# Patient Record
Sex: Male | Born: 1955 | Race: White | Hispanic: No | Marital: Married | Smoking: Former smoker
Health system: Southern US, Community
[De-identification: ages and names within clinical notes are randomized; demographics above are authoritative.]

## PROBLEM LIST (undated history)

## (undated) DIAGNOSIS — K219 Gastro-esophageal reflux disease without esophagitis: Secondary | ICD-10-CM

## (undated) DIAGNOSIS — M1990 Unspecified osteoarthritis, unspecified site: Secondary | ICD-10-CM

## (undated) DIAGNOSIS — E785 Hyperlipidemia, unspecified: Secondary | ICD-10-CM

## (undated) DIAGNOSIS — I10 Essential (primary) hypertension: Secondary | ICD-10-CM

## (undated) DIAGNOSIS — T7840XA Allergy, unspecified, initial encounter: Secondary | ICD-10-CM

## (undated) HISTORY — DX: Gastro-esophageal reflux disease without esophagitis: K21.9

## (undated) HISTORY — PX: GANGLION CYST EXCISION: SHX1691

## (undated) HISTORY — DX: Hyperlipidemia, unspecified: E78.5

## (undated) HISTORY — DX: Unspecified osteoarthritis, unspecified site: M19.90

## (undated) HISTORY — PX: OTHER SURGICAL HISTORY: SHX169

## (undated) HISTORY — PX: TONSILLECTOMY: SHX5217

## (undated) HISTORY — DX: Essential (primary) hypertension: I10

## (undated) HISTORY — PX: PILONIDAL CYST / SINUS EXCISION: SUR543

## (undated) HISTORY — PX: COLONOSCOPY: SHX174

## (undated) HISTORY — DX: Allergy, unspecified, initial encounter: T78.40XA

---

## 2001-04-06 ENCOUNTER — Ambulatory Visit (HOSPITAL_BASED_OUTPATIENT_CLINIC_OR_DEPARTMENT_OTHER): Admission: RE | Admit: 2001-04-06 | Discharge: 2001-04-06 | Payer: Self-pay | Admitting: Orthopedic Surgery

## 2001-04-06 ENCOUNTER — Encounter (INDEPENDENT_AMBULATORY_CARE_PROVIDER_SITE_OTHER): Payer: Self-pay | Admitting: Specialist

## 2006-08-13 ENCOUNTER — Ambulatory Visit: Payer: Self-pay | Admitting: Family Medicine

## 2006-08-21 ENCOUNTER — Ambulatory Visit: Payer: Self-pay | Admitting: Family Medicine

## 2006-09-23 ENCOUNTER — Encounter: Admission: RE | Admit: 2006-09-23 | Discharge: 2006-09-23 | Payer: Self-pay | Admitting: Family Medicine

## 2006-10-13 ENCOUNTER — Ambulatory Visit: Payer: Self-pay | Admitting: Family Medicine

## 2006-10-13 LAB — CONVERTED CEMR LAB
ALT: 81 units/L — ABNORMAL HIGH (ref 0–40)
AST: 57 units/L — ABNORMAL HIGH (ref 0–37)
BUN: 11 mg/dL (ref 6–23)
CO2: 29 meq/L (ref 19–32)
Calcium: 9.2 mg/dL (ref 8.4–10.5)
Chloride: 98 meq/L (ref 96–112)
Creatinine, Ser: 0.9 mg/dL (ref 0.4–1.5)
GFR calc non Af Amer: 95 mL/min
Glomerular Filtration Rate, Af Am: 114 mL/min/{1.73_m2}
Glucose, Bld: 189 mg/dL — ABNORMAL HIGH (ref 70–99)
Hgb A1c MFr Bld: 8.9 % — ABNORMAL HIGH (ref 4.6–6.0)
Potassium: 3.9 meq/L (ref 3.5–5.1)
Sodium: 135 meq/L (ref 135–145)

## 2006-10-19 ENCOUNTER — Ambulatory Visit: Payer: Self-pay | Admitting: Gastroenterology

## 2006-11-13 ENCOUNTER — Ambulatory Visit: Payer: Self-pay | Admitting: Gastroenterology

## 2006-11-18 ENCOUNTER — Ambulatory Visit: Payer: Self-pay | Admitting: Family Medicine

## 2006-11-18 LAB — CONVERTED CEMR LAB
ALT: 148 units/L — ABNORMAL HIGH (ref 0–40)
AST: 113 units/L — ABNORMAL HIGH (ref 0–37)
BUN: 10 mg/dL (ref 6–23)
CO2: 27 meq/L (ref 19–32)
Calcium: 9.4 mg/dL (ref 8.4–10.5)
Chloride: 102 meq/L (ref 96–112)
Chol/HDL Ratio, serum: 4.6
Cholesterol: 144 mg/dL (ref 0–200)
Creatinine, Ser: 1 mg/dL (ref 0.4–1.5)
GFR calc non Af Amer: 84 mL/min
Glomerular Filtration Rate, Af Am: 101 mL/min/{1.73_m2}
Glucose, Bld: 141 mg/dL — ABNORMAL HIGH (ref 70–99)
HDL: 31.5 mg/dL — ABNORMAL LOW (ref >39.0)
Hgb A1c MFr Bld: 8.3 % — ABNORMAL HIGH (ref 4.6–6.0)
LDL DIRECT: 56 mg/dL
Potassium: 4 meq/L (ref 3.5–5.1)
Sodium: 140 meq/L (ref 135–145)
Triglyceride fasting, serum: 449 mg/dL (ref 0–149)
VLDL: 90 mg/dL — ABNORMAL HIGH (ref 0–40)

## 2006-11-19 ENCOUNTER — Encounter (INDEPENDENT_AMBULATORY_CARE_PROVIDER_SITE_OTHER): Payer: Self-pay | Admitting: Family Medicine

## 2006-11-19 LAB — CONVERTED CEMR LAB
Creatinine, Urine: 784.3 mg/dL
Microalb Creat Ratio: 100.5 mg/g — ABNORMAL HIGH (ref 0.0–30.0)
Microalb, Ur: 78.8 mg/dL — ABNORMAL HIGH (ref 0.00–1.89)

## 2006-11-26 ENCOUNTER — Ambulatory Visit: Payer: Self-pay | Admitting: Family Medicine

## 2006-11-27 ENCOUNTER — Emergency Department (HOSPITAL_COMMUNITY): Admission: EM | Admit: 2006-11-27 | Discharge: 2006-11-27 | Payer: Self-pay | Admitting: Emergency Medicine

## 2006-12-01 ENCOUNTER — Encounter: Admission: RE | Admit: 2006-12-01 | Discharge: 2007-03-01 | Payer: Self-pay | Admitting: Family Medicine

## 2006-12-02 ENCOUNTER — Ambulatory Visit: Payer: Self-pay | Admitting: Family Medicine

## 2006-12-23 ENCOUNTER — Ambulatory Visit: Payer: Self-pay | Admitting: Family Medicine

## 2006-12-23 LAB — CONVERTED CEMR LAB
BUN: 13 mg/dL (ref 6–23)
CO2: 34 meq/L — ABNORMAL HIGH (ref 19–32)
Calcium: 9.5 mg/dL (ref 8.4–10.5)
Chloride: 101 meq/L (ref 96–112)
Creatinine, Ser: 0.8 mg/dL (ref 0.4–1.5)
Creatinine,U: 131.5 mg/dL
GFR calc Af Amer: 131 mL/min
GFR calc non Af Amer: 108 mL/min
Glucose, Bld: 143 mg/dL — ABNORMAL HIGH (ref 70–99)
Hgb A1c MFr Bld: 7.4 % — ABNORMAL HIGH (ref 4.6–6.0)
Microalb Creat Ratio: 38.8 mg/g — ABNORMAL HIGH (ref 0.0–30.0)
Microalb, Ur: 5.1 mg/dL — ABNORMAL HIGH (ref 0.0–1.9)
Potassium: 4.3 meq/L (ref 3.5–5.1)
Sodium: 143 meq/L (ref 135–145)

## 2007-01-07 ENCOUNTER — Ambulatory Visit: Payer: Self-pay | Admitting: Internal Medicine

## 2007-01-14 ENCOUNTER — Ambulatory Visit: Payer: Self-pay | Admitting: Gastroenterology

## 2007-01-21 ENCOUNTER — Ambulatory Visit (HOSPITAL_BASED_OUTPATIENT_CLINIC_OR_DEPARTMENT_OTHER): Admission: RE | Admit: 2007-01-21 | Discharge: 2007-01-21 | Payer: Self-pay | Admitting: Internal Medicine

## 2007-01-24 ENCOUNTER — Ambulatory Visit: Payer: Self-pay | Admitting: Internal Medicine

## 2007-02-05 ENCOUNTER — Ambulatory Visit: Payer: Self-pay | Admitting: Gastroenterology

## 2007-02-05 ENCOUNTER — Encounter (INDEPENDENT_AMBULATORY_CARE_PROVIDER_SITE_OTHER): Payer: Self-pay | Admitting: *Deleted

## 2007-03-22 ENCOUNTER — Ambulatory Visit: Payer: Self-pay | Admitting: Internal Medicine

## 2007-03-26 DIAGNOSIS — K219 Gastro-esophageal reflux disease without esophagitis: Secondary | ICD-10-CM | POA: Insufficient documentation

## 2007-03-26 DIAGNOSIS — E669 Obesity, unspecified: Secondary | ICD-10-CM | POA: Insufficient documentation

## 2007-03-26 DIAGNOSIS — R739 Hyperglycemia, unspecified: Secondary | ICD-10-CM | POA: Insufficient documentation

## 2007-03-26 DIAGNOSIS — E785 Hyperlipidemia, unspecified: Secondary | ICD-10-CM | POA: Insufficient documentation

## 2007-03-26 DIAGNOSIS — I10 Essential (primary) hypertension: Secondary | ICD-10-CM | POA: Insufficient documentation

## 2007-06-30 ENCOUNTER — Ambulatory Visit: Payer: Self-pay | Admitting: Family Medicine

## 2007-06-30 DIAGNOSIS — E1149 Type 2 diabetes mellitus with other diabetic neurological complication: Secondary | ICD-10-CM | POA: Insufficient documentation

## 2007-07-01 ENCOUNTER — Telehealth (INDEPENDENT_AMBULATORY_CARE_PROVIDER_SITE_OTHER): Payer: Self-pay | Admitting: *Deleted

## 2007-07-01 LAB — CONVERTED CEMR LAB
ALT: 135 units/L — ABNORMAL HIGH (ref 0–53)
AST: 180 units/L — ABNORMAL HIGH (ref 0–37)
BUN: 10 mg/dL (ref 6–23)
CO2: 31 meq/L (ref 19–32)
Calcium: 9.2 mg/dL (ref 8.4–10.5)
Chloride: 96 meq/L (ref 96–112)
Cholesterol: 234 mg/dL (ref 0–200)
Creatinine, Ser: 0.8 mg/dL (ref 0.4–1.5)
Creatinine,U: 242.3 mg/dL
Direct LDL: 131.5 mg/dL
GFR calc Af Amer: 131 mL/min
GFR calc non Af Amer: 108 mL/min
Glucose, Bld: 123 mg/dL — ABNORMAL HIGH (ref 70–99)
HDL: 39.4 mg/dL (ref >39.0)
Hgb A1c MFr Bld: 6.5 % — ABNORMAL HIGH (ref 4.6–6.0)
Microalb Creat Ratio: 89.6 mg/g — ABNORMAL HIGH (ref 0.0–30.0)
Microalb, Ur: 21.7 mg/dL — ABNORMAL HIGH (ref 0.0–1.9)
Potassium: 4 meq/L (ref 3.5–5.1)
Sodium: 138 meq/L (ref 135–145)
Total CHOL/HDL Ratio: 5.9
Triglycerides: 334 mg/dL (ref 0–149)
VLDL: 67 mg/dL — ABNORMAL HIGH (ref 0–40)

## 2007-08-24 ENCOUNTER — Encounter (INDEPENDENT_AMBULATORY_CARE_PROVIDER_SITE_OTHER): Payer: Self-pay | Admitting: Family Medicine

## 2007-12-30 ENCOUNTER — Ambulatory Visit: Payer: Self-pay | Admitting: Family Medicine

## 2007-12-30 DIAGNOSIS — B354 Tinea corporis: Secondary | ICD-10-CM | POA: Insufficient documentation

## 2008-01-04 DIAGNOSIS — R945 Abnormal results of liver function studies: Secondary | ICD-10-CM | POA: Insufficient documentation

## 2008-01-04 LAB — CONVERTED CEMR LAB
BUN: 11 mg/dL (ref 6–23)
CO2: 29 meq/L (ref 19–32)
Calcium: 9.5 mg/dL (ref 8.4–10.5)
Chloride: 95 meq/L — ABNORMAL LOW (ref 96–112)
Cholesterol: 194 mg/dL (ref 0–200)
Creatinine, Ser: 0.9 mg/dL (ref 0.4–1.5)
Creatinine,U: 53.1 mg/dL
Direct LDL: 73.4 mg/dL
GFR calc Af Amer: 114 mL/min
GFR calc non Af Amer: 94 mL/min
Glucose, Bld: 115 mg/dL — ABNORMAL HIGH (ref 70–99)
HDL: 36 mg/dL — ABNORMAL LOW (ref >39.0)
Hgb A1c MFr Bld: 7.2 % — ABNORMAL HIGH (ref 4.6–6.0)
Microalb Creat Ratio: 79.1 mg/g — ABNORMAL HIGH (ref 0.0–30.0)
Microalb, Ur: 4.2 mg/dL — ABNORMAL HIGH (ref 0.0–1.9)
PSA: 0.35 ng/mL (ref 0.10–4.00)
Potassium: 4.1 meq/L (ref 3.5–5.1)
Sodium: 134 meq/L — ABNORMAL LOW (ref 135–145)
Total CHOL/HDL Ratio: 5.4
Triglycerides: 574 mg/dL (ref 0–149)
VLDL: 115 mg/dL — ABNORMAL HIGH (ref 0–40)

## 2008-01-05 ENCOUNTER — Encounter (INDEPENDENT_AMBULATORY_CARE_PROVIDER_SITE_OTHER): Payer: Self-pay | Admitting: *Deleted

## 2008-01-05 ENCOUNTER — Telehealth (INDEPENDENT_AMBULATORY_CARE_PROVIDER_SITE_OTHER): Payer: Self-pay | Admitting: *Deleted

## 2008-01-26 ENCOUNTER — Telehealth (INDEPENDENT_AMBULATORY_CARE_PROVIDER_SITE_OTHER): Payer: Self-pay | Admitting: *Deleted

## 2008-05-30 ENCOUNTER — Ambulatory Visit: Payer: Self-pay | Admitting: Internal Medicine

## 2008-05-30 DIAGNOSIS — M1710 Unilateral primary osteoarthritis, unspecified knee: Secondary | ICD-10-CM | POA: Insufficient documentation

## 2008-05-30 DIAGNOSIS — M179 Osteoarthritis of knee, unspecified: Secondary | ICD-10-CM | POA: Insufficient documentation

## 2008-12-25 ENCOUNTER — Telehealth: Payer: Self-pay | Admitting: Internal Medicine

## 2008-12-28 ENCOUNTER — Ambulatory Visit: Payer: Self-pay | Admitting: Internal Medicine

## 2008-12-28 DIAGNOSIS — S300XXA Contusion of lower back and pelvis, initial encounter: Secondary | ICD-10-CM | POA: Insufficient documentation

## 2009-02-01 ENCOUNTER — Telehealth: Payer: Self-pay | Admitting: Internal Medicine

## 2009-04-02 ENCOUNTER — Ambulatory Visit: Payer: Self-pay | Admitting: Internal Medicine

## 2009-04-02 LAB — CONVERTED CEMR LAB
ALT: 109 units/L — ABNORMAL HIGH (ref 0–53)
AST: 127 units/L — ABNORMAL HIGH (ref 0–37)
Albumin: 3.6 g/dL (ref 3.5–5.2)
Alkaline Phosphatase: 128 units/L — ABNORMAL HIGH (ref 39–117)
BUN: 20 mg/dL (ref 6–23)
Basophils Relative: 0 % (ref 0.0–3.0)
Bilirubin Urine: NEGATIVE
Bilirubin, Direct: 0 mg/dL (ref 0.0–0.3)
CO2: 28 meq/L (ref 19–32)
Calcium: 9.1 mg/dL (ref 8.4–10.5)
Chloride: 99 meq/L (ref 96–112)
Cholesterol: 375 mg/dL — ABNORMAL HIGH (ref 0–200)
Creatinine, Ser: 0.7 mg/dL (ref 0.4–1.5)
Direct LDL: 74.7 mg/dL
Eosinophils Relative: 2 % (ref 0.0–5.0)
GFR calc non Af Amer: 125.01 mL/min (ref >60)
Glucose, Bld: 353 mg/dL — ABNORMAL HIGH (ref 70–99)
HCT: 43.4 % (ref 39.0–52.0)
HDL: 16.4 mg/dL — ABNORMAL LOW (ref >39.00)
Hemoglobin, Urine: NEGATIVE
Hemoglobin: 15.3 g/dL (ref 13.0–17.0)
Leukocytes, UA: NEGATIVE
Lymphocytes Relative: 31 % (ref 12.0–46.0)
MCHC: 35.2 g/dL (ref 30.0–36.0)
MCV: 84.2 fL (ref 78.0–100.0)
Monocytes Relative: 7 % (ref 3.0–12.0)
Neutrophils Relative %: 59 % (ref 43.0–77.0)
Nitrite: NEGATIVE
PSA: 0.22 ng/mL (ref 0.10–4.00)
Platelets: 149 10*3/uL — ABNORMAL LOW (ref 150.0–400.0)
Potassium: 4.2 meq/L (ref 3.5–5.1)
RBC: 5.15 M/uL (ref 4.22–5.81)
RDW: 13.3 % (ref 11.5–14.6)
Sodium: 135 meq/L (ref 135–145)
Specific Gravity, Urine: 1.015 (ref 1.000–1.030)
TSH: 5.97 microintl units/mL — ABNORMAL HIGH (ref 0.35–5.50)
Total Bilirubin: 1.1 mg/dL (ref 0.3–1.2)
Total CHOL/HDL Ratio: 23
Total Protein, Urine: 100 mg/dL
Total Protein: 7.2 g/dL (ref 6.0–8.3)
Triglycerides: 2700 mg/dL — ABNORMAL HIGH (ref 0.0–149.0)
Urine Glucose: >=1000 mg/dL
Urobilinogen, UA: 0.2 (ref 0.0–1.0)
VLDL: 540 mg/dL — ABNORMAL HIGH (ref 0.0–40.0)
WBC: 6.8 10*3/uL (ref 4.5–10.5)
pH: 5.5 (ref 5.0–8.0)

## 2009-04-05 ENCOUNTER — Ambulatory Visit: Payer: Self-pay | Admitting: Internal Medicine

## 2009-04-05 ENCOUNTER — Telehealth: Payer: Self-pay | Admitting: Internal Medicine

## 2009-04-05 DIAGNOSIS — E8881 Metabolic syndrome: Secondary | ICD-10-CM | POA: Insufficient documentation

## 2009-04-05 DIAGNOSIS — E039 Hypothyroidism, unspecified: Secondary | ICD-10-CM | POA: Insufficient documentation

## 2009-04-05 LAB — CONVERTED CEMR LAB
Cholesterol, target level: 200 mg/dL
HDL goal, serum: 40 mg/dL
LDL Goal: 100 mg/dL

## 2009-04-25 LAB — HM COLONOSCOPY: HM Colonoscopy: NORMAL

## 2009-04-26 ENCOUNTER — Encounter: Payer: Self-pay | Admitting: Internal Medicine

## 2009-06-18 ENCOUNTER — Telehealth: Payer: Self-pay | Admitting: Internal Medicine

## 2009-06-18 ENCOUNTER — Ambulatory Visit: Payer: Self-pay | Admitting: Internal Medicine

## 2009-06-18 LAB — CONVERTED CEMR LAB
ALT: 85 units/L — ABNORMAL HIGH (ref 0–53)
AST: 82 units/L — ABNORMAL HIGH (ref 0–37)
Albumin: 3.5 g/dL (ref 3.5–5.2)
Alkaline Phosphatase: 82 units/L (ref 39–117)
Bilirubin, Direct: 0.1 mg/dL (ref 0.0–0.3)
Cholesterol: 185 mg/dL (ref 0–200)
Direct LDL: 96.8 mg/dL
HDL: 40 mg/dL (ref >39.00)
Hgb A1c MFr Bld: 8.9 % — ABNORMAL HIGH (ref 4.6–6.5)
Total Bilirubin: 0.6 mg/dL (ref 0.3–1.2)
Total CHOL/HDL Ratio: 5
Total Protein: 7.5 g/dL (ref 6.0–8.3)
Triglycerides: 414 mg/dL — ABNORMAL HIGH (ref 0.0–149.0)
VLDL: 82.8 mg/dL — ABNORMAL HIGH (ref 0.0–40.0)

## 2009-06-20 ENCOUNTER — Ambulatory Visit: Payer: Self-pay | Admitting: Internal Medicine

## 2009-06-20 LAB — HM DIABETES FOOT EXAM

## 2009-07-17 ENCOUNTER — Telehealth: Payer: Self-pay | Admitting: Internal Medicine

## 2009-08-22 ENCOUNTER — Telehealth (INDEPENDENT_AMBULATORY_CARE_PROVIDER_SITE_OTHER): Payer: Self-pay | Admitting: *Deleted

## 2009-09-26 ENCOUNTER — Telehealth: Payer: Self-pay | Admitting: Internal Medicine

## 2009-10-19 ENCOUNTER — Telehealth: Payer: Self-pay | Admitting: Internal Medicine

## 2009-11-29 ENCOUNTER — Telehealth: Payer: Self-pay | Admitting: Internal Medicine

## 2009-12-03 ENCOUNTER — Encounter: Payer: Self-pay | Admitting: Internal Medicine

## 2009-12-26 ENCOUNTER — Telehealth: Payer: Self-pay | Admitting: Internal Medicine

## 2009-12-27 ENCOUNTER — Ambulatory Visit: Payer: Self-pay | Admitting: Internal Medicine

## 2009-12-27 DIAGNOSIS — L84 Corns and callosities: Secondary | ICD-10-CM | POA: Insufficient documentation

## 2010-02-05 ENCOUNTER — Encounter: Payer: Self-pay | Admitting: Internal Medicine

## 2010-03-27 ENCOUNTER — Encounter: Payer: Self-pay | Admitting: Internal Medicine

## 2010-03-28 ENCOUNTER — Encounter: Admission: RE | Admit: 2010-03-28 | Discharge: 2010-06-26 | Payer: Self-pay | Admitting: Surgery

## 2010-03-29 ENCOUNTER — Ambulatory Visit (HOSPITAL_COMMUNITY): Admission: RE | Admit: 2010-03-29 | Discharge: 2010-03-29 | Payer: Self-pay | Admitting: Surgery

## 2010-04-12 ENCOUNTER — Ambulatory Visit (HOSPITAL_COMMUNITY): Admission: RE | Admit: 2010-04-12 | Discharge: 2010-04-12 | Payer: Self-pay | Admitting: Surgery

## 2010-04-17 ENCOUNTER — Encounter: Payer: Self-pay | Admitting: Internal Medicine

## 2010-06-06 ENCOUNTER — Encounter: Payer: Self-pay | Admitting: Internal Medicine

## 2010-06-10 HISTORY — PX: GASTRIC BANDING PORT REVISION: SHX5246

## 2010-06-18 ENCOUNTER — Ambulatory Visit (HOSPITAL_COMMUNITY): Admission: RE | Admit: 2010-06-18 | Discharge: 2010-06-19 | Payer: Self-pay | Admitting: Surgery

## 2010-06-27 ENCOUNTER — Telehealth: Payer: Self-pay | Admitting: Internal Medicine

## 2010-07-01 ENCOUNTER — Ambulatory Visit: Payer: Self-pay | Admitting: Internal Medicine

## 2010-07-01 LAB — CONVERTED CEMR LAB
ALT: 59 units/L — ABNORMAL HIGH (ref 0–53)
AST: 44 units/L — ABNORMAL HIGH (ref 0–37)
Albumin: 4.1 g/dL (ref 3.5–5.2)
Alkaline Phosphatase: 44 units/L (ref 39–117)
BUN: 14 mg/dL (ref 6–23)
Basophils Absolute: 0 10*3/uL (ref 0.0–0.1)
Basophils Relative: 0.3 % (ref 0.0–3.0)
Bilirubin, Direct: 0.1 mg/dL (ref 0.0–0.3)
CO2: 26 meq/L (ref 19–32)
Calcium: 9.5 mg/dL (ref 8.4–10.5)
Chloride: 103 meq/L (ref 96–112)
Cholesterol: 125 mg/dL (ref 0–200)
Creatinine, Ser: 0.7 mg/dL (ref 0.4–1.5)
Direct LDL: 71.4 mg/dL
Eosinophils Absolute: 0.4 10*3/uL (ref 0.0–0.7)
Eosinophils Relative: 4.8 % (ref 0.0–5.0)
GFR calc non Af Amer: 126.52 mL/min (ref >60)
Glucose, Bld: 71 mg/dL (ref 70–99)
HCT: 42.1 % (ref 39.0–52.0)
HDL: 29.2 mg/dL — ABNORMAL LOW (ref >39.00)
Hemoglobin: 14 g/dL (ref 13.0–17.0)
Hgb A1c MFr Bld: 7.9 % — ABNORMAL HIGH (ref 4.6–6.5)
Lymphocytes Relative: 35.8 % (ref 12.0–46.0)
Lymphs Abs: 3.2 10*3/uL (ref 0.7–4.0)
MCHC: 33.4 g/dL (ref 30.0–36.0)
MCV: 85.6 fL (ref 78.0–100.0)
Monocytes Absolute: 0.8 10*3/uL (ref 0.1–1.0)
Monocytes Relative: 8.8 % (ref 3.0–12.0)
Neutro Abs: 4.4 10*3/uL (ref 1.4–7.7)
Neutrophils Relative %: 50.3 % (ref 43.0–77.0)
Platelets: 183 10*3/uL (ref 150.0–400.0)
Potassium: 4.2 meq/L (ref 3.5–5.1)
RBC: 4.92 M/uL (ref 4.22–5.81)
RDW: 13.5 % (ref 11.5–14.6)
Sodium: 142 meq/L (ref 135–145)
TSH: 1.77 microintl units/mL (ref 0.35–5.50)
Total Bilirubin: 0.8 mg/dL (ref 0.3–1.2)
Total CHOL/HDL Ratio: 4
Total Protein: 7.2 g/dL (ref 6.0–8.3)
Triglycerides: 204 mg/dL — ABNORMAL HIGH (ref 0.0–149.0)
VLDL: 40.8 mg/dL — ABNORMAL HIGH (ref 0.0–40.0)
WBC: 8.8 10*3/uL (ref 4.5–10.5)

## 2010-07-02 ENCOUNTER — Encounter: Admission: RE | Admit: 2010-07-02 | Discharge: 2010-08-08 | Payer: Self-pay | Admitting: Surgery

## 2010-07-03 ENCOUNTER — Encounter: Payer: Self-pay | Admitting: Internal Medicine

## 2010-07-04 ENCOUNTER — Ambulatory Visit: Payer: Self-pay | Admitting: Internal Medicine

## 2010-07-04 DIAGNOSIS — F5101 Primary insomnia: Secondary | ICD-10-CM | POA: Insufficient documentation

## 2010-08-01 ENCOUNTER — Telehealth: Payer: Self-pay | Admitting: Internal Medicine

## 2010-08-14 ENCOUNTER — Encounter
Admission: RE | Admit: 2010-08-14 | Discharge: 2010-08-14 | Payer: Self-pay | Source: Home / Self Care | Attending: Surgery | Admitting: Surgery

## 2010-08-16 ENCOUNTER — Encounter: Payer: Self-pay | Admitting: Internal Medicine

## 2010-09-02 ENCOUNTER — Ambulatory Visit: Payer: Self-pay | Admitting: Internal Medicine

## 2010-09-02 DIAGNOSIS — G5710 Meralgia paresthetica, unspecified lower limb: Secondary | ICD-10-CM | POA: Insufficient documentation

## 2010-09-03 ENCOUNTER — Ambulatory Visit: Payer: Self-pay | Admitting: Internal Medicine

## 2010-09-03 LAB — CONVERTED CEMR LAB
ALT: 26 units/L (ref 0–53)
AST: 25 units/L (ref 0–37)
Albumin: 3.9 g/dL (ref 3.5–5.2)
Alkaline Phosphatase: 58 units/L (ref 39–117)
BUN: 18 mg/dL (ref 6–23)
Bilirubin, Direct: 0.1 mg/dL (ref 0.0–0.3)
CO2: 25 meq/L (ref 19–32)
Calcium: 9.4 mg/dL (ref 8.4–10.5)
Chloride: 103 meq/L (ref 96–112)
Cholesterol: 150 mg/dL (ref 0–200)
Creatinine, Ser: 0.6 mg/dL (ref 0.4–1.5)
Direct LDL: 66.7 mg/dL
GFR calc non Af Amer: 143.05 mL/min (ref >60)
Glucose, Bld: 84 mg/dL (ref 70–99)
HDL: 34.2 mg/dL — ABNORMAL LOW (ref >39.00)
Hgb A1c MFr Bld: 6.3 % (ref 4.6–6.5)
Potassium: 4.4 meq/L (ref 3.5–5.1)
Sodium: 138 meq/L (ref 135–145)
Total Bilirubin: 1 mg/dL (ref 0.3–1.2)
Total CHOL/HDL Ratio: 4
Total Protein: 7.1 g/dL (ref 6.0–8.3)
Triglycerides: 319 mg/dL — ABNORMAL HIGH (ref 0.0–149.0)
VLDL: 63.8 mg/dL — ABNORMAL HIGH (ref 0.0–40.0)

## 2010-09-09 ENCOUNTER — Encounter: Payer: Self-pay | Admitting: Internal Medicine

## 2010-09-20 ENCOUNTER — Encounter: Payer: Self-pay | Admitting: Internal Medicine

## 2010-09-20 ENCOUNTER — Telehealth: Payer: Self-pay | Admitting: Internal Medicine

## 2010-10-11 ENCOUNTER — Telehealth: Payer: Self-pay | Admitting: Internal Medicine

## 2010-10-25 ENCOUNTER — Emergency Department (HOSPITAL_COMMUNITY)
Admission: EM | Admit: 2010-10-25 | Discharge: 2010-10-25 | Payer: Self-pay | Source: Home / Self Care | Admitting: Emergency Medicine

## 2010-11-28 ENCOUNTER — Telehealth: Payer: Self-pay | Admitting: Internal Medicine

## 2010-12-10 NOTE — Progress Notes (Signed)
Summary: Gabapentin  Phone Note Call from Patient   Summary of Call: Patient is requesting refill of gabapentin - needs new rx for 300mg  three times a day. Pt states this has helped his leg. Ok for refill of new rx?  Initial call taken by: Lamar Sprinkles, CMA,  September 20, 2010 10:30 AM  Follow-up for Phone Call        Yes, gabapentin 300mg  1 three times a day, #90 as needed refills Follow-up by: Jacques Navy MD,  September 20, 2010 1:10 PM  Additional Follow-up for Phone Call Additional follow up Details #1::        Pt informed  Additional Follow-up by: Lamar Sprinkles, CMA,  September 20, 2010 1:52 PM    New/Updated Medications: GABAPENTIN 300 MG CAPS (GABAPENTIN) 1 three times a day Prescriptions: GABAPENTIN 300 MG CAPS (GABAPENTIN) 1 three times a day  #270 x 3   Entered by:   Lamar Sprinkles, CMA   Authorized by:   Jacques Navy MD   Signed by:   Lamar Sprinkles, CMA on 09/20/2010   Method used:   [facility]        [facility] [address] [phone]* (retail)       [address]       [place], [place]  [address]       Ph: [phone] or [phone]       Fax: [phone]   RxID:   [id number]

## 2010-12-10 NOTE — Assessment & Plan Note (Signed)
Summary: HIP & LEG PAIN/ WANTS CORTISONE INJ/NWS #   Vital Signs:  Patient profile:   55 year old male Height:      76 inches Weight:      313 pounds BMI:     38.24 O2 Sat:      94 % on Room air Temp:     98.0 degrees F oral Pulse rate:   93 / minute BP sitting:   116 / 70  (left arm) Cuff size:   large  Vitals Entered By: Bill Salinas CMA (September 02, 2010 3:33 PM)  O2 Flow:  Room air CC: pt here with c/o left sided leg and hip pain/ ab   Primary Care Provider:  Norins  CC:  pt here with c/o left sided leg and hip pain/ ab.  History of Present Illness: Patient with persistent c/o paresthesia and discomfort at the lateral aspect of the left thigh. He has previously been diagnosed with meralgia paresthetica. His chiropractor agrees with this diagnosis. He has tried loosening his clothes, taking anti-inflammatory drugs and he has lost 60 lbs post-lap band surgery. He has had no falls and there is no motor weakness.   Current Medications (verified): 1)  Norvasc 10 Mg Tabs (Amlodipine Besylate) .... Take 1 Tablet Once A Day 2)  Niaspan 1000 Mg Cr-Tabs (Niacin (Antihyperlipidemic)) .... 2 Once Daily 3)  Metformin Hcl 1000 Mg  Tabs (Metformin Hcl) .... Take One Tablet Twice Daily 4)  Hydrochlorothiazide 25 Mg  Tabs (Hydrochlorothiazide) .... Take One Tablet Daily 5)  Protonix 40 Mg  Pack (Pantoprazole Sodium) .... Take One Tablet Daily 6)  Aspirin 81 Mg  Tabs (Aspirin) .... Take 1 Tablet By Mouth Once A Day 7)  One-A-Day Extras Antioxidant   Caps (Multiple Vitamins-Minerals) 8)  Enalapril Maleate 20 Mg  Tabs (Enalapril Maleate) .... Take One Tablet Daily 9)  Fish Oil 1200 Mg  Caps (Omega-3 Fatty Acids) .... Take 5 Tablet By Mouth Once A Day 10)  Glipizide 2.5 Mg  Xr24h-Tab (Glipizide) .Marland Kitchen.. 1 By Mouth Once Daily 11)  Lantus Solostar 100 Unit/ml  Soln (Insulin Glargine) .Marland Kitchen.. 100 Units Q Hs 12)  Cialis 20 Mg Tabs (Tadalafil) .Marland Kitchen.. 1 By Mouth Prn 13)  Halcion 0.25 Mg Tabs (Triazolam)  .Marland Kitchen.. 1 By Mouth At Bedtime As Needed For Sleep  Allergies (verified): 1)  ! 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  The patient denies anorexia, fever, chest pain, dyspnea on exertion, abdominal pain, muscle weakness, suspicious skin lesions, difficulty walking, and enlarged lymph nodes.    Physical Exam  General:  obese white male in no acute distress Eyes:  pupils equal and pupils round.  C&S clear Lungs:  normal respiratory effort.   Heart:  normal rate and regular rhythm.   Msk:  normal ROM, no joint tenderness, no joint swelling, and no joint warmth.   Pulses:  2+ radial Neurologic:  alert & oriented X3, cranial nerves II-XII intact, and gait normal.   Skin:  turgor normal and color normal.   Psych:  good eye contact and not anxious appearing.  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  Plan - gabapentin with an upward titration: 100mg  once daily  x 3, two times a day x 3,  three times a day x 3, 2 tabs tid x 3, then 3 tabs three times a day  and report back. If he fails to get any relief with gabapentin will get MRI L-S spine.   Complete Medication List: 1)  Norvasc 10 Mg Tabs (Amlodipine besylate) .... Take 1 tablet once a day 2)  Niaspan 1000 Mg Cr-tabs (Niacin (antihyperlipidemic)) .... 2 once daily 3)  Metformin Hcl 1000 Mg Tabs (Metformin hcl) .... Take one tablet twice daily 4)  Hydrochlorothiazide 25 Mg  Tabs (Hydrochlorothiazide) .... Take one tablet daily 5)  Protonix 40 Mg Pack (Pantoprazole sodium) .... Take one tablet daily 6)  Aspirin 81 Mg Tabs (Aspirin) .... Take 1 tablet by mouth once a day 7)  One-a-day Extras Antioxidant Caps (Multiple vitamins-minerals) 8)  Enalapril Maleate 20 Mg Tabs (Enalapril maleate) .... Take one tablet daily 9)  Fish Oil 1200 Mg Caps (Omega-3 fatty acids) .... Take 5 tablet by mouth once a day 10)  Glipizide 2.5 Mg Xr24h-tab (Glipizide) .Marland Kitchen.. 1 by mouth once daily 11)  Lantus Solostar 100 Unit/ml Soln (Insulin glargine) .Marland Kitchen.. 100 units q hs 12)  Cialis 20 Mg Tabs (Tadalafil) .Marland Kitchen.. 1 by mouth prn 13)  Halcion 0.25 Mg Tabs (Triazolam) .Marland Kitchen.. 1 by mouth at bedtime as needed for sleep 14)  Gabapentin 100 Mg Caps (Gabapentin) .Marland Kitchen.. 1 tab qd  x 3, 1 tab two times a day x 3, 1 tab three times a day x 3, 2 tabs three times a day x 3, 3 tabs three times a day check with md  Other Orders: Admin 1st Vaccine (91478) Flu Vaccine 34yrs + (29562) Tdap => 72yrs IM (13086) Admin of Any Addtl Vaccine (57846) Prescriptions: GABAPENTIN 100 MG CAPS (GABAPENTIN) 1 tab qd  x 3, 1 tab two times a day x 3, 1 tab three times a day x 3, 2 tabs three times a day x 3, 3 tabs three times a day check with MD  #70 x 1   Entered and Authorized by:   Jacques Navy MD   Signed by:   Jacques Navy MD on 09/02/2010   Method used:   Electronically to        [facility] [address] [phone]* (retail)       [address]       [place], [place]  [address]       Ph: [phone] or [phone]       Fax: [phone]   RxID:   [id number]    Orders Added: 1)  Admin 1st Vaccine [90471] 2)  Flu Vaccine 73yrs + [30160] 3)  Tdap => 27yrs IM [10932] 4)  Admin of Any Addtl Vaccine [90472] 5)  Est. Patient Level III [35573]   Immunizations Administered:  Tetanus Vaccine:    Vaccine Type: Tdap    Site: left deltoid    Mfr: GlaxoSmithKline    Dose: 0.5 ml    Route:  IM    Given by: Ami Bullins CMA    Exp. Date: 08/29/2012    Lot #: [id number]    VIS given: 09/27/08 version given September 02, 2010.   Immunizations Administered:  Tetanus Vaccine:    Vaccine Type: Tdap    Site:  left deltoid    Mfr: GlaxoSmithKline    Dose: 0.5 ml    Route: IM    Given by: Ami Bullins CMA    Exp. Date: 08/29/2012    Lot #: [id number]    VIS given: 09/27/08 version given September 02, 2010.  Flu Vaccine Consent Questions     Do you have a history of severe allergic reactions to this vaccine? no    Any prior history of allergic reactions to egg and/or gelatin? no    Do you have a sensitivity to the preservative Thimersol? no    Do you have a past history of Guillan-Barre Syndrome? no    Do you currently have an acute febrile illness? no    Have you ever had a severe reaction to latex? no    Vaccine information given and explained to patient? yes    Are you currently pregnant? no    Lot Number:[id number]   Exp Date:05/10/2011   Site Given Right Deltoid [id number]

## 2010-12-10 NOTE — Letter (Signed)
Summary: [facility]   Imported By: Lennie Odor 06/21/2010 13:49:25  _____________________________________________________________________  External Attachment:    Type:   Image     Comment:   External Document

## 2010-12-10 NOTE — Letter (Signed)
Summary: [facility]  [facility]   Imported By: Lester Galesburg 04/23/2010 12:21:24  _____________________________________________________________________  External Attachment:    Type:   Image     Comment:   External Document

## 2010-12-10 NOTE — Progress Notes (Signed)
Summary: rx increase  Phone Note From Pharmacy   Summary of Call: pharmacy is requesting new rx for insulin 100 phs. prior rx had pt on 35 u qd Initial call taken by: Margaret Pyle, CMA,  July 17, 2009 11:10 AM  Follow-up for Phone Call        Verified patients dose of Lantus as 100 units at bedtime. May adjust prescription-quantity- accordingly Follow-up by: Jacques Navy MD,  July 17, 2009 1:47 PM    Prescriptions: LANTUS SOLOSTAR 100 UNIT/ML  SOLN (INSULIN GLARGINE) 100 units q hs  #3 mth supply x 3   Entered by:   Lamar Sprinkles, CMA   Authorized by:   Jacques Navy MD   Signed by:   Lamar Sprinkles, CMA on 07/17/2009   Method used:   Faxed to ...       [facility] [address] [phone]* (retail)       [address]       [place], [place]  [address]       Ph: [phone] or [phone]       Fax: [phone]   RxID:   [id number]

## 2010-12-10 NOTE — Progress Notes (Signed)
Summary: tail bone  Phone Note Call from Patient Call back at Work Phone [phone]   Caller: Patient Summary of Call: Patient called  states he fell down some steps x 1wk ago and broke his tail bone. He states he has not had any xrays but knows its broken from hx. He would like to kow is there anything that can be done. He is aware MD is out of the office and would like to wait for advisement. Initial call taken by: Rock Nephew CMA,  December 25, 2008 1:54 PM  Follow-up for Phone Call        OV this week Ice, OTC pain meds Follow-up by: Tresa Garter MD,  December 26, 2008 10:05 AM  Additional Follow-up for Phone Call Additional follow up Details #1::        patietn notified and added to schedule Additional Follow-up by: Rock Nephew CMA,  December 26, 2008 12:29 PM

## 2010-12-10 NOTE — Assessment & Plan Note (Signed)
Summary: DIABETIC WITH LEFT FOOT BLISTER/CALLUS/KB   Vital Signs:  Patient profile:   55 year old male Height:      76 inches Weight:      354.50 pounds BMI:     43.31 O2 Sat:      93 % on Room air Temp:     98.4 degrees F oral Pulse rate:   96 / minute BP sitting:   108 / 70  (left arm) Cuff size:   large  Vitals Entered By: Brenton Grills (December 27, 2009 9:10 AM)  O2 Flow:  Room air CC: Pt has blister on left foot that won't heal since 11/06/2009/aj Is Patient Diabetic? Yes Did you bring your meter with you today? No   Primary Care Provider:  Daryus Sowash  CC:  Pt has blister on left foot that won't heal since 11/06/2009/aj.  History of Present Illness: Patient presents for wound on the left foot. He was in [place] and did a lot of walking. He reports that he developed an "ulcer" on his left foot at the base of the third digit. He presents for advice as to treatment. He has no other complaints and has been doing well.  He is considering bariatric surgery and needs direction.   Current Medications (verified): 1)  Norvasc 10 Mg Tabs (Amlodipine Besylate) .... Take 1 Tablet Once A Day 2)  Niaspan 1000 Mg Cr-Tabs (Niacin (Antihyperlipidemic)) .... 2 Once Daily 3)  Metformin Hcl 1000 Mg  Tabs (Metformin Hcl) .... Take One Tablet Twice Daily 4)  Hydrochlorothiazide 25 Mg  Tabs (Hydrochlorothiazide) .... Take One Tablet Daily 5)  Protonix 40 Mg  Pack (Pantoprazole Sodium) .... Take One Tablet Daily 6)  Aspirin 81 Mg  Tabs (Aspirin) .... Take 1 Tablet By Mouth Once A Day 7)  One-A-Day Extras Antioxidant   Caps (Multiple Vitamins-Minerals) 8)  Enalapril Maleate 20 Mg  Tabs (Enalapril Maleate) .... Take One Tablet Daily 9)  Fish Oil 1200 Mg  Caps (Omega-3 Fatty Acids) .... Take 5 Tablet By Mouth Once A Day 10)  Glipizide 2.5 Mg  Xr24h-Tab (Glipizide) .Marland Kitchen.. 1 By Mouth Once Daily 11)  Lantus Solostar 100 Unit/ml  Soln (Insulin Glargine) .Marland Kitchen.. 100 Units Q Hs 12)  Minocycline Hcl 100 Mg Caps  (Minocycline Hcl) .... Once Daily X 3-74mos 13)  Synthroid 125 Mcg Tabs (Levothyroxine Sodium) .... Once Daily  Allergies (verified): 1)  ! 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 Marriage in good health.  Review of Systems  The patient denies anorexia, fever, weight loss, weight gain, hoarseness, syncope, dyspnea on exertion, hemoptysis, and abdominal pain.    Physical Exam  General:  overweight white male in no distress Head:  Normocephalic and atraumatic without obvious abnormalities. No apparent alopecia or balding. Lungs:  normal respiratory effort and normal breath sounds.  Heart:  normal rate and regular rhythm.   Skin:  base of left foot at the MTP-plantar joint third digit is a large callus with deep fissure but no open ulcer. No surrounding erythema or tenderness   Impression & Recommendations:  Problem # 1:  CALLUS, LEFT FOOT (ICD-700) Patient with mature callus with fissuring. No sign of infection.   Plan - advised to schedule appt with dermatologist - Dr. Danella Deis  Problem # 2:  OBESITY (ICD-278.00) Encouraged patient to move ahead with bariatric surgery. first step - attend information session at [facility]. He is a good  candidate and bariatric surgery is medically indicated.   Complete Medication List: 1)  Norvasc 10 Mg Tabs (Amlodipine besylate) .... Take 1 tablet once a day 2)  Niaspan 1000 Mg Cr-tabs (Niacin (antihyperlipidemic)) .... 2 once daily 3)  Metformin Hcl 1000 Mg Tabs (Metformin hcl) .... Take one tablet twice daily 4)  Hydrochlorothiazide 25 Mg Tabs (Hydrochlorothiazide) .... Take one tablet daily 5)  Protonix 40 Mg Pack (Pantoprazole sodium) .... Take one tablet daily 6)  Aspirin 81 Mg Tabs (Aspirin) .... Take 1 tablet by mouth once a day 7)  One-a-day Extras Antioxidant Caps (Multiple vitamins-minerals) 8)  Enalapril Maleate 20 Mg Tabs (Enalapril maleate) .... Take one tablet daily 9)  Fish Oil 1200 Mg Caps (Omega-3 fatty acids) .... Take 5 tablet by mouth once a day 10)  Glipizide 2.5 Mg Xr24h-tab (Glipizide) .Marland Kitchen.. 1 by mouth once daily 11)  Lantus Solostar 100 Unit/ml Soln (Insulin glargine) .Marland Kitchen.. 100 units q hs 12)  Minocycline Hcl 100 Mg Caps (Minocycline hcl) .... Once daily x 3-37mos 13)  Synthroid 125 Mcg Tabs (Levothyroxine sodium) .... Once daily

## 2010-12-10 NOTE — Progress Notes (Signed)
  Phone Note Other Incoming   Summary of Call: Pt wants to know if he should get labs before him comes in for his medication follow up. Please Advise Initial call taken by: Ami Bullins CMA,  June 27, 2010 11:25 AM  Follow-up for Phone Call        TSH 244.9, hepatic 794.8, metabolic 401.9, lipid 272.4, A1C 250.00, CBC 530.81 Follow-up by: Jacques Navy MD,  June 27, 2010 1:04 PM  Additional Follow-up for Phone Call Additional follow up Details #1::        labs put in IDX. Pt informed. Additional Follow-up by: Ami Bullins CMA,  June 27, 2010 2:45 PM

## 2010-12-10 NOTE — Progress Notes (Signed)
Summary: LABS  Phone Note Call from Patient   Summary of Call: Pt came into  the lab this am but there are no orders. Does pt need labs?  Initial call taken by: Lamar Sprinkles,  June 18, 2009 9:23 AM  Follow-up for Phone Call        A1C 250.02, lipid 272.2 hepatic 995.20 Follow-up by: Jacques Navy MD,  June 18, 2009 12:40 PM  Additional Follow-up for Phone Call Additional follow up Details #1::        lab in idx, lab informed Additional Follow-up by: Lamar Sprinkles,  June 18, 2009 2:03 PM

## 2010-12-10 NOTE — Assessment & Plan Note (Signed)
Summary: 3 mon ov  //tl  Medications Added * FISH OIL       Allergies Added: ! PCN  Vital Signs:  Patient Profile:   55 Years Old Male Weight:      342.8 pounds Temp:     97.0 degrees F oral Pulse rate:   78 / minute Resp:     14 per minute BP sitting:   124 / 84  (right arm)  Pt. in pain?   no  Vitals Entered By: Ardyth Man (December 30, 2007 11:40 AM)                  Chief Complaint:  med refill and Type 2 diabetes mellitus follow-up.  History of Present Illness:  Type 2 Diabetes Mellitus Follow-Up      This is a 55 year old man who presents for Type 2 diabetes mellitus follow-up.  The patient denies polyuria, polydipsia, blurred vision, self managed hypoglycemia, hypoglycemia requiring help, weight loss, weight gain, and numbness of extremities.  The patient denies the following symptoms: chest pain, orthostatic symptoms, and intermittent claudication.  Since the last visit the patient reports poor dietary compliance, exercising regularly, and monitoring blood glucose.  The patient has been measuring capillary blood glucose before breakfast.   Report blood sugar averages about 110. Reports he will schedule his eye exam whiich is due  Hyperlipidemia Follow-Up      The patient also presents for Hyperlipidemia follow-up.  Compliance with medications (by patient report) has been near 100%.    Hypertension Follow-Up      The patient also presents for Hypertension follow-up.  Compliance with medications (by patient report) has been near 100%.     Patient reports he is up to date on his colonoscopy and saw GI this January for LFT f/u and scheduled for routine f/u to monitor closely.  Prostate cancer screen to be done today  Reports a pruritic rash on  righ lower leg for 2 weeks.    Current Allergies (reviewed today): ! PCN  Past Medical History:    Reviewed history from 03/26/2007 and no changes required:       Diabetes mellitus, type II       GERD  Hyperlipidemia       Hypertension      Physical Exam  General:     Obesity,well-nourished and well-hydrated.   Neck:     No deformities, masses, or tenderness noted. Lungs:     Normal respiratory effort, chest expands symmetrically. Lungs are clear to auscultation, no crackles or wheezes. Heart:     Normal rate and regular rhythm. S1 and S2 normal without gallop, murmur, click, rub or other extra sounds. Prostate:     Prostate gland firm and smooth, no enlargement, nodularity, tenderness, mass, asymmetry or induration. Skin:     Erythematous plague appearing rash on  lower anterior tibia bilaterally  Diabetes Management Exam:    Foot Exam (with socks and/or shoes not present):       Sensory-Pinprick/Light touch:          Left medial foot (L-4): normal          Left dorsal foot (L-5): normal          Left lateral foot (S-1): normal          Right medial foot (L-4): normal          Right dorsal foot (L-5): normal          Right lateral foot (  S-1): normal       Sensory-Monofilament:          Left foot: normal          Right foot: normal       Inspection:          Left foot: normal          Right foot: normal       Nails:          Left foot: thickened          Right foot: thickened    Impression & Recommendations:  Problem # 1:  DIABETES MELLITUS, TYPE II (ICD-250.00) Previously at goal Recheck labs Further recommendation after review  His updated medication list for this problem includes:    Metformin Hcl 1000 Mg Tabs (Metformin hcl) .Marland Kitchen... Take one tablet daily    Bayer Aspirin 325 Mg Tabs (Aspirin) .Marland Kitchen... Take one tablet daily    Enalapril Maleate 20 Mg Tabs (Enalapril maleate) .Marland Kitchen... Take one tablet daily  Orders: TLB-Glucose, QUANT (82947-GLU) TLB-A1C / Hgb A1C (Glycohemoglobin) (83036-A1C)  Labs Reviewed: HgBA1c: 6.5 (06/30/2007)   Creat: 0.8 (06/30/2007)      Problem # 2:  HYPERTENSION (ICD-401.9) Borderline, but improved  Continue lifestyle  changes His updated medication list for this problem includes:    Norvasc 10 Mg Tabs (Amlodipine besylate) .Marland Kitchen... Take 1 tablet once a day    Hydrochlorothiazide 25 Mg Tabs (Hydrochlorothiazide) .Marland Kitchen... Take one tablet daily    Enalapril Maleate 20 Mg Tabs (Enalapril maleate) .Marland Kitchen... Take one tablet daily  Orders: TLB-BMP (Basic Metabolic Panel-BMET) (80048-METABOL)  BP today: 124/84 Prior BP: 130/90 (06/30/2007)  Labs Reviewed: Creat: 0.8 (06/30/2007) Chol: 234 (06/30/2007)   HDL: 39.4 (06/30/2007)   LDL: DEL (06/30/2007)   TG: 334 (06/30/2007)   Problem # 3:  HYPERLIPIDEMIA (ICD-272.4)  His updated medication list for this problem includes:    Niaspan 750 Mg Tbcr (Niacin (antihyperlipidemic)) .Marland Kitchen... Take one tablet daily.  Orders: TLB-Lipid Panel (80061-LIPID) Venipuncture (16109)  Labs Reviewed: Chol: 234 (06/30/2007)   HDL: 39.4 (06/30/2007)   LDL: DEL (06/30/2007)   TG: 334 (06/30/2007) SGOT: 180 (06/30/2007)   SGPT: 135 (06/30/2007)   Problem # 4:  TINEA CORPORIS (ICD-110.5) Recommended OTC lotrimin two times a day for 14 days. If no improvement, patient to f/u or if it worsens  Problem # 5:  SPECIAL SCREENING MALIGNANT NEOPLASM OF PROSTATE (ICD-V76.44)  Orders: TLB-PSA (Prostate Specific Antigen) (84153-PSA)   Complete Medication List: 1)  Norvasc 10 Mg Tabs (Amlodipine besylate) .... Take 1 tablet once a day 2)  Niaspan 750 Mg Tbcr (Niacin (antihyperlipidemic)) .... Take one tablet daily. 3)  Metformin Hcl 1000 Mg Tabs (Metformin hcl) .... Take one tablet daily 4)  Hydrochlorothiazide 25 Mg Tabs (Hydrochlorothiazide) .... Take one tablet daily 5)  Protonix 40 Mg Pack (Pantoprazole sodium) .... Take one tablet daily 6)  Bayer Aspirin 325 Mg Tabs (Aspirin) .... Take one tablet daily 7)  One-a-day Extras Antioxidant Caps (Multiple vitamins-minerals) 8)  Enalapril Maleate 20 Mg Tabs (Enalapril maleate) .... Take one tablet daily 9)  Fish Oil       Prescriptions: ENALAPRIL MALEATE 20 MG  TABS (ENALAPRIL MALEATE) take one tablet daily  #90 x 3   Entered and Authorized by:   Leanne Chang MD   Signed by:   Leanne Chang MD on 12/30/2007   Method used:   Print then Give to Patient   RxID:   [id number] PROTONIX 40 MG  PACK (PANTOPRAZOLE SODIUM)  Take one tablet daily  #90 x 3   Entered and Authorized by:   Leanne Chang MD   Signed by:   Leanne Chang MD on 12/30/2007   Method used:   Print then Give to Patient   RxID:   [id number] HYDROCHLOROTHIAZIDE 25 MG  TABS (HYDROCHLOROTHIAZIDE) Take one tablet daily  #90 x 3   Entered and Authorized by:   Leanne Chang MD   Signed by:   Leanne Chang MD on 12/30/2007   Method used:   Print then Give to Patient   RxID:   [id number] METFORMIN HCL 1000 MG  TABS (METFORMIN HCL) Take one tablet daily  #90 x 3   Entered and Authorized by:   Leanne Chang MD   Signed by:   Leanne Chang MD on 12/30/2007   Method used:   Print then Give to Patient   RxID:   [id number] NIASPAN 750 MG  TBCR (NIACIN (ANTIHYPERLIPIDEMIC)) Take one tablet daily.  #90 x 3   Entered and Authorized by:   Leanne Chang MD   Signed by:   Leanne Chang MD on 12/30/2007   Method used:   Print then Give to Patient   RxID:   [id number] NORVASC 10 MG TABS (AMLODIPINE BESYLATE) Take 1 tablet once a day  #90 x 3   Entered and Authorized by:   Leanne Chang MD   Signed by:   Leanne Chang MD on 12/30/2007   Method used:   Print then Give to Patient   RxID:   [id number]  ]

## 2010-12-10 NOTE — Letter (Signed)
Summary: [facility]   Imported By: Sherian Rein 10/09/2010 08:32:00  _____________________________________________________________________  External Attachment:    Type:   Image     Comment:   External Document

## 2010-12-10 NOTE — Progress Notes (Signed)
  Phone Note Refill Request Message from:  Fax from Pharmacy on September 26, 2009 4:20 PM  Refills Requested: Medication #1:  NIASPAN 1000 MG CR-TABS 2 once daily   Dosage confirmed as above?Dosage Confirmed Initial call taken by: Josph Macho CMA,  September 26, 2009 4:20 PM    Prescriptions: NIASPAN 1000 MG CR-TABS (NIACIN (ANTIHYPERLIPIDEMIC)) 2 once daily  #60 Tablet x 1   Entered by:   Josph Macho CMA   Authorized by:   Etta Grandchild MD   Signed by:   Josph Macho CMA on 09/26/2009   Method used:   [facility]        [facility] [address] [phone]* (retail)       [address]       [place], [place]  [address]       Ph: [phone] or [phone]       Fax: [phone]   RxID:   [id number]

## 2010-12-10 NOTE — Letter (Signed)
Summary: Eye Exam / [facility]  Eye Exam / [facility]   Imported By: Lennie Odor 05/08/2009 12:05:22  _____________________________________________________________________  External Attachment:    Type:   Image     Comment:   External Document

## 2010-12-10 NOTE — Letter (Signed)
Summary: Generic Letter  [facility]  [address] [place], [place] [address]   Phone: [phone]  Fax: [phone]    12/03/2009  Stewart Gerlich [address] [place], [place]  [address]  Dear Mr. Jeremiah,   We have tried to contact you unsuccessfully regarding your call to our office. Please contact us with an updated phone number and schedule an appointment to address your concerns.         Sincerely,   Lamar Sprinkles, CMA

## 2010-12-10 NOTE — Progress Notes (Signed)
  Prescriptions: METFORMIN HCL 1000 MG  TABS (METFORMIN HCL) Take one tablet twice daily  #180 x 3   Entered by:   Ami Bullins CMA   Authorized by:   Jacques Navy MD   Signed by:   Bill Salinas CMA on 10/19/2009   Method used:   [facility]ronically to        [facility] [address] [phone]* (retail)       [address]       [place], [place]  [address]       Ph: [phone] or [phone]       Fax: [phone]   RxID:   [id number]

## 2010-12-10 NOTE — Assessment & Plan Note (Signed)
Summary: DR MEN PT-30 MIN SLOT PER SARAH-LAB DISCUSS-$50-STC   Vital Signs:  Patient profile:   55 year old male Height:      76 inches Weight:      350.75 pounds BMI:     42.85 O2 Sat:      94 % on Room air Temp:     97.6 degrees F oral Pulse rate:   102 / minute BP sitting:   160 / 82  (right arm) Cuff size:   large  Vitals Entered By: Rock Nephew CMA (Apr 05, 2009 1:53 PM)  Nutrition Counseling: Patient's BMI is greater than 25 and therefore counseled on weight management options.  O2 Flow:  Room air  Primary Care Shiori Adcox:  Nor[facility]   History of Present Illness: Mr. Banik is a 55 years old caucasian male with a history of hypertension, hyperlipidemia and Diabetes Mellitus who presents for a follow up of abnormal labs. He reports nocturia and states he gets up 3 to 4 times a night to urinate. He denies urinay hesitancy, dyuria, weak stream or incontinence, hematuria, polydipsia, polyphagia, poor wound healing, chest pain, palpitations, diaphoresis, nausea, vomiting, abdominal pain, new onset of weakness or numbness, headaches, changes in vision or changes in bowel habits. He admit to being compliant with his medicaitons and monitors his blood glucose three times a day with readings between 110-130s. No other complaints.  Hypertension History:      He complains of peripheral edema, but denies headache, chest pain, palpitations, dyspnea with exertion, orthopnea, PND, visual symptoms, neurologic problems, syncope, and side effects from treatment.  He notes no problems with any antihypertensive medication side effects.        Positive major cardiovascular risk factors include male age 64 years old or older, diabetes, hyperlipidemia, and hypertension.  Negative major cardiovascular risk factors include negative family history for ischemic heart disease and non-tobacco-user status.        Further assessment for target organ damage reveals no history of ASHD, cardiac end-organ damage  (CHF/LVH), stroke/TIA, peripheral vascular disease, renal insufficiency, or hypertensive retinopathy.    Lipid Management History:      Positive NCEP/ATP III risk factors include male age 34 years old or older, diabetes, HDL cholesterol less than 40, and hypertension.  Negative NCEP/ATP III risk factors include no family history for ischemic heart disease, non-tobacco-user status, no ASHD (atherosclerotic heart disease), no prior stroke/TIA, no peripheral vascular disease, and no history of aortic aneurysm.        The patient states that he knows about the "Therapeutic Lifestyle Change" diet.  His compliance with the TLC diet is not at all.  The patient expresses understanding of adjunctive measures for cholesterol lowering.  He expresses no side effects from his lipid-lowering medication.  The patient denies any symptoms to suggest myopathy or liver disease.     Clinical Review Panels:  Lipid Management   Cholesterol:  375 (04/02/2009)   LDL (bad choesterol):  DEL (12/30/2007)   HDL (good cholesterol):  16.40 (04/02/2009)   Triglycerides:  449 (11/18/2006)  Diabetes Management   HgBA1C:  7.2 (12/30/2007)   Creatinine:  0.7 (04/02/2009)   Last Foot Exam:  yes (04/05/2009)  CBC   WBC:  6.8 (04/02/2009)   RBC:  5.15 (04/02/2009)   Hgb:  15.3 (04/02/2009)   Hct:  43.4 (04/02/2009)   Platelets:  149.0 (04/02/2009)   MCV  84.2 (04/02/2009)   MCHC  35.2 (04/02/2009)   RDW  13.3 (04/02/2009)   PMN:  59.0 (04/02/2009)   Lymphs:  31.0 (04/02/2009)   Monos:  7.0 (04/02/2009)   Eosinophils:  2.0 (04/02/2009)   Basophil:  0.0 (04/02/2009)  Complete Metabolic Panel   Glucose:  353 (04/02/2009)   Sodium:  135 (04/02/2009)   Potassium:  4.2 (04/02/2009)   Chloride:  99 (04/02/2009)   CO2:  28 (04/02/2009)   BUN:  20 (04/02/2009)   Creatinine:  0.7 (04/02/2009)   Albumin:  3.6 (04/02/2009)   Total Protein:  7.2 (04/02/2009)   Calcium:  9.1 (04/02/2009)   Total Bili:  1.1 (04/02/2009)    Alk Phos:  128 (04/02/2009)   SGPT (ALT):  109 (04/02/2009)   SGOT (AST):  127 (04/02/2009)   Current Medications (verified): 1)  Norvasc 10 Mg Tabs (Amlodipine Besylate) .... Take 1 Tablet Once A Day 2)  Niaspan 1000 Mg Cr-Tabs (Niacin (Antihyperlipidemic)) .... 2 Once Daily 3)  Metformin Hcl 1000 Mg  Tabs (Metformin Hcl) .... Take One Tablet Daily 4)  Hydrochlorothiazide 25 Mg  Tabs (Hydrochlorothiazide) .... Take One Tablet Daily 5)  Protonix 40 Mg  Pack (Pantoprazole Sodium) .... Take One Tablet Daily 6)  Aspirin 81 Mg  Tabs (Aspirin) .... Take 1 Tablet By Mouth Once A Day 7)  One-A-Day Extras Antioxidant   Caps (Multiple Vitamins-Minerals) 8)  Enalapril Maleate 20 Mg  Tabs (Enalapril Maleate) .... Take One Tablet Daily 9)  Fish Oil 1200 Mg  Caps (Omega-3 Fatty Acids) .... Take 5 Tablet By Mouth Once A Day 10)  Glipizide 2.5 Mg  Xr24h-Tab (Glipizide) .Marland Kitchen.. 1 By Mouth Once Daily 11)  Lantus Solostar 100 Unit/ml  Soln (Insulin Glargine) .Marland Kitchen.. 100 Units Q Hs 12)  Minocycline Hcl 100 Mg Caps (Minocycline Hcl) .... Once Daily X 3-70mos 13)  Synthroid 125 Mcg Tabs (Levothyroxine Sodium) .... Once Daily  Allergies (verified): 1)  ! Pcn  Past History:  Family History: Last updated: 2008-06-29 father-deceased @55 : metatstatic cancer to Brain, CAD/ MI x 2  (41, 45) mother - deceased @82 : cancer rapid on-set Neg - colon or prostate cancer; DM:  Social History: Last updated: Jun 29, 2008 [facility] - 2years. 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 Marriage in good health.  Risk Factors: Alcohol Use: <1 (06/29/2008) Caffeine Use: 2 (06/29/08) Exercise: no (29-Jun-2008)  Risk Factors: Smoking Status: quit (06-29-2008) Passive Smoke Exposure: yes (2008-06-29)  Past Medical History: Reviewed history from 2008-06-29 and no changes required. 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 arthroscopy x 2 right arthroscopy x 1 left ([name]) Ganglion cyst-right wrist ([place]ma) correction of deviated septum tonsilectomy - remote pilonidal cyst - at 55 y/o  Family History: Reviewed history from June 29, 2008 and no changes required. father-deceased @56 : metatstatic cancer to Brain, CAD/ MI x 2  04-20-2040, 45) mother - deceased @82 : cancer rapid on-set Neg - colon or prostate cancer; DM:  Social History: Reviewed history from 2008-06-29 and no changes required. [facility] - 2years. 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 Marriage in good health.  Review of Systems       The patient complains of weight gain.  The patient denies fever, weight loss, vision loss, chest pain, syncope, dyspnea on exertion, peripheral edema, prolonged cough, headaches,  hemoptysis, abdominal pain, melena, hematochezia, severe indigestion/heartburn, hematuria, incontinence, transient blindness, and abnormal bleeding.    Physical Exam  General:  Overweight white male NAD Head:  Normocephalic and atraumatic without obvious abnormalities. No apparent alopecia or balding. Eyes:  vision grossly intact, pupils equal, pupils round, and pupils reactive to light.   Ears:  no external deformities.   Nose:  no external deformity, no external erythema, and no active bleeding or clots.   Neck:  No deformities, masses, or tenderness noted. Lungs:  Normal respiratory effort, chest expands symmetrically. Lungs are clear to auscultation, no crackles or wheezes. Heart:  normal rate, regular rhythm, no murmur, no gallop, and no JVD.   Abdomen:  obese, protuberant abdomen. non-tender and normal bowel sounds.non-tender, normal bowel sounds, no guarding, and no  rigidity.   Msk:  coccyx is tender, no crepitus Pulses:  R and L carotid,radial,femoral,dorsalis pedis and posterior tibial pulses are full and equal bilaterally Extremities:  No clubbing, cyanosis, edema, or deformity noted with normal full range of motion of all joints.   Neurologic:  alert & oriented X3.  gait normal.   Skin:  turgor normal and color normal.   Psych:  memory intact for recent and remote, normally interactive, good eye contact, and slightly anxious appearing.    Diabetes Management Exam:    Foot Exam (with socks and/or shoes not present):       Sensory-Pinprick/Light touch:          Left medial foot (L-4): normal          Left dorsal foot (L-5): normal          Left lateral foot (S-1): normal          Right medial foot (L-4): normal          Right dorsal foot (L-5): normal          Right lateral foot (S-1): normal       Sensory-Monofilament:          Left foot: normal          Right foot: normal       Inspection:          Left foot: normal          Right foot: normal       Nails:          Left foot: normal          Right foot: normal   Impression & Recommendations:  Problem # 1:  DIABETES MELLITUS, TYPE II (ICD-250.00) Patient with poorly controlled TYPE II Diabetes with labs significant for a fasting blood glucose of 353. Plan: Increase Insulin regimen to 100 units of Lantus at bed time couple with strict diabetic diet. Follow up with PCP and recheck labs to calibrate insulin regimen and achieve optimal glycemic control. His updated medication list for this problem includes:    Metformin Hcl 1000 Mg Tabs (Metformin hcl) .Marland Kitchen... Take one tablet daily    Aspirin 81 Mg Tabs (Aspirin) .Marland Kitchen... Take 1 tablet by mouth once a day    Enalapril Maleate 20 Mg Tabs (Enalapril maleate) .Marland Kitchen... Take one tablet daily    Glipizide 2.5 Mg Xr24h-tab (Glipizide) .Marland Kitchen... 1 by mouth once daily    Lantus Solostar 100 Unit/ml Soln (Insulin glargine) .Marland KitchenMarland KitchenMarland KitchenMarland Kitchen 100 units q hs  Problem # 2:   HYPOTHYROIDISM (ICD-244.9) Patient with new onset of hypothyroidism and incidental lab findings of a TSH at 5.97  Plan: Initiate Synthroid 125 Mcg with  plans to monitor TSH and titrate Synthroid until patient is euthymic. His updated medication list for this problem includes:    Synthroid 125 Mcg Tabs (Levothyroxine sodium) ..... Once daily  Problem # 3:  HYPERLIPIDEMIA (ICD-272.4) Assessment: Deteriorated Asymptomatic patient with significantly elevated triglcyerides with labs at 2700 and a presetnation consistent with fatty liver disease. Plan: Increase Niaspan to 2000mg  per day as directed. Monitor patient's triglycerides to assess theraputic benefits of a rigorous Niacin regimen. His updated medication list for this problem includes:    Niaspan 1000 Mg Cr-tabs (Niacin (antihyperlipidemic)) .Marland Kitchen... 2 once daily  Problem # 4:  HYPERTENSION (ICD-401.9) Anxious appearing patient with elevated HR of 102 and BP of 160/82. Plan: Monitor BP at next office visit to assess for persistent elevations in BP with the intent to tirate hypertensive regimen accordingly His updated medication list for this problem includes:    Norvasc 10 Mg Tabs (Amlodipine besylate) .Marland Kitchen... Take 1 tablet once a day    Hydrochlorothiazide 25 Mg Tabs (Hydrochlorothiazide) .Marland Kitchen... Take one tablet daily    Enalapril Maleate 20 Mg Tabs (Enalapril maleate) .Marland Kitchen... Take one tablet daily  Problem # 5:  LIVER FUNCTION TESTS, ABNORMAL (ICD-794.8) Assessment: Unchanged  Problem # 6:  OBESITY (ICD-278.00) Assessment: Deteriorated  Complete Medication List: 1)  Norvasc 10 Mg Tabs (Amlodipine besylate) .... Take 1 tablet once a day 2)  Niaspan 1000 Mg Cr-tabs (Niacin (antihyperlipidemic)) .... 2 once daily 3)  Metformin Hcl 1000 Mg Tabs (Metformin hcl) .... Take one tablet daily 4)  Hydrochlorothiazide 25 Mg Tabs (Hydrochlorothiazide) .... Take one tablet daily 5)  Protonix 40 Mg Pack (Pantoprazole sodium) .... Take one tablet  daily 6)  Aspirin 81 Mg Tabs (Aspirin) .... Take 1 tablet by mouth once a day 7)  One-a-day Extras Antioxidant Caps (Multiple vitamins-minerals) 8)  Enalapril Maleate 20 Mg Tabs (Enalapril maleate) .... Take one tablet daily 9)  Fish Oil 1200 Mg Caps (Omega-3 fatty acids) .... Take 5 tablet by mouth once a day 10)  Glipizide 2.5 Mg Xr24h-tab (Glipizide) .Marland Kitchen.. 1 by mouth once daily 11)  Lantus Solostar 100 Unit/ml Soln (Insulin glargine) .Marland Kitchen.. 100 units q hs 12)  Minocycline Hcl 100 Mg Caps (Minocycline hcl) .... Once daily x 3-65mos 13)  Synthroid 125 Mcg Tabs (Levothyroxine sodium) .... Once daily  Other Orders: Ophthalmology Referral (Ophthalmology) Diabetic Clinic Referral (Diabetic)  Hypertension Assessment/Plan:      The patient's hypertensive risk group is category C: Target organ damage and/or diabetes.  Today's blood pressure is 160/82.  His blood pressure goal is < 130/80.  Lipid Assessment/Plan:      Based on NCEP/ATP III, the patient's risk factor category is "history of diabetes".  The patient's lipid goals are as follows: Total cholesterol goal is 200; LDL cholesterol goal is 100; HDL cholesterol goal is 40; Triglyceride goal is 150.     Patient Instructions: 1)  Please schedule a follow-up appointment in 2 weeks. 2)  It is important that you exercise regularly at least 20 minutes 5 times a week. If you develop chest pain, have severe difficulty breathing, or feel very tired , stop exercising immediately and seek medical attention. 3)  You need to lose weight. Consider a lower calorie diet and regular exercise.  Prescriptions: SYNTHROID 125 MCG TABS (LEVOTHYROXINE SODIUM) once daily  #21 x 0   Entered and Authorized by:   Etta Grandchild MD   Signed by:   Etta Grandchild MD on 04/05/2009   Method used:  Historical   RxID:   [id number] NIASPAN 1000 MG CR-TABS (NIACIN (ANTIHYPERLIPIDEMIC)) 2 once daily  #60 x 3   Entered and Authorized by:   Etta Grandchild MD   Signed  by:   Etta Grandchild MD on 04/05/2009   Method used:   [facility]        [facility] [address] [phone]* (retail)       [address]       [place], [place]  [address]       Ph: [phone] or [phone]       Fax: [phone]   RxID:   [id number]

## 2010-12-10 NOTE — Progress Notes (Signed)
Summary: alej-refill   Phone Note Refill Request   Refills Requested: Medication #1:  LANTUS SOLOSTAR 100 UNIT/ML  SOLN Use as directed 5 x 3. [facility] --fax--[phone]  Initial call taken by: Freddy Jaksch,  January 26, 2008 4:48 PM  Follow-up for Phone Call        Please see refills, DONE ...................................................................Ardyth Man  January 26, 2008 5:00 PM  Follow-up by: Ardyth Man,  January 26, 2008 5:00 PM

## 2010-12-10 NOTE — Letter (Signed)
Summary: LMN for Bariatric Surgery/[facility]  LMN for Bariatric Surgery/[facility]   Imported By: Sherian Rein 02/14/2010 15:14:33  _____________________________________________________________________  External Attachment:    Type:   Image     Comment:   External Document

## 2010-12-10 NOTE — Letter (Signed)
Summary: [facility]   Imported By: Sherian Rein 09/03/2010 14:10:03  _____________________________________________________________________  External Attachment:    Type:   Image     Comment:   External Document

## 2010-12-10 NOTE — Assessment & Plan Note (Signed)
Summary: NEW/ [facility] / DR. Blossom Hoops TRANSFER/ REFERRED BY BILL YEARN...   Vital Signs:  Patient Profile:   55 Years Old Male Height:     76 inches Weight:      344.6 pounds BMI:     42.10 Temp:     97.3 degrees F oral Pulse rate:   90 / minute BP sitting:   132 / 89  (right arm) Cuff size:   large  Vitals Entered By: Zackery Barefoot CMA (May 30, 2008 11:30 AM)                 PCP:  [facility]  Chief Complaint:  Transfer patient from Dr. Christy Sartorius refill.  History of Present Illness: Patient presents to establish for on-going primary care. He was formerly followed by Dr. Blossom Hoops for diabetes, lipids, obesity.  Patient reports that he is doing well. His diabetes has been controlled. He has been adherent to his medical regimen. He plans to begin a serious program of exercise and aims to loose weight.    Updated Prior Medication List: NORVASC 10 MG TABS (AMLODIPINE BESYLATE) Take 1 tablet once a day NIASPAN 750 MG  TBCR (NIACIN (ANTIHYPERLIPIDEMIC)) Take one tablet daily. METFORMIN HCL 1000 MG  TABS (METFORMIN HCL) Take one tablet daily HYDROCHLOROTHIAZIDE 25 MG  TABS (HYDROCHLOROTHIAZIDE) Take one tablet daily PROTONIX 40 MG  PACK (PANTOPRAZOLE SODIUM) Take one tablet daily ASPIRIN 81 MG  TABS (ASPIRIN) Take 1 tablet by mouth once a day ONE-A-DAY EXTRAS ANTIOXIDANT   CAPS (MULTIPLE VITAMINS-MINERALS)  ENALAPRIL MALEATE 20 MG  TABS (ENALAPRIL MALEATE) take one tablet daily FISH OIL 1200 MG  CAPS (OMEGA-3 FATTY ACIDS) Take 5 tablet by mouth once a day GLUCOTROL XL 2.5 MG  TB24 (GLIPIZIDE) Take one tablet daily LANTUS SOLOSTAR 100 UNIT/ML  SOLN (INSULIN GLARGINE) Use as directed 25 units per day  Current Allergies: ! 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    Marriage in good health.   Risk Factors:  Tobacco use:  quit    Year quit:  '96    Pack-years:  20+ pk yrs Passive smoke exposure:  yes Drug use:  no HIV high-risk behavior:  no Caffeine use:  2 drinks per day Alcohol use:  yes    Type:  wine, vodlka  Drinks per day:  <1    Counseled to quit/cut down alcohol use:  no Exercise:  no Seatbelt use:  100 % Sun Exposure:  frequently   Review of Systems  The patient denies anorexia, fever, weight loss, weight gain, vision loss, decreased hearing, hoarseness, chest pain, syncope, dyspnea on exertion, peripheral edema, prolonged cough, headaches, hemoptysis, abdominal pain, melena, hematochezia, severe indigestion/heartburn, hematuria, incontinence, genital sores, muscle weakness, suspicious skin lesions, transient blindness, difficulty walking, depression, unusual weight change, abnormal bleeding, enlarged lymph nodes, angioedema, and testicular masses.     Physical Exam  General:     Overweight white male NAD Head:     Normocephalic and atraumatic without obvious abnormalities. No apparent alopecia or balding. Neck:     No deformities, masses, or tenderness noted. Lungs:     Normal respiratory effort, chest expands symmetrically. Lungs are clear to auscultation, no crackles or wheezes. Heart:     normal rate,  regular rhythm, no murmur, no gallop, and no JVD.   Abdomen:     obese. Msk:     hands without Heberden's or Bouchard's nodes,  no joint tenderness, no joint swelling, and no joint warmth.   Neurologic:     alert & oriented X3, cranial nerves II-XII intact, and gait normal.   Skin:     turgor normal and color normal.   Psych:     memory intact for recent and remote, normally interactive, good eye contact, and not anxious appearing.      Impression & Recommendations:  Problem # 1:  OSTEOARTHRITIS (ICD-715.90) Minor complaint with no restriction of activities.  Plan: warm soaks if stiff, otc NSAIDs  His updated medication list for this problem includes:    Aspirin 81 Mg Tabs (Aspirin) .Marland Kitchen... Take 1 tablet by mouth once a day   Problem # 2:  OBESITY (ICD-278.00) Discussed weight loss: target 230 lbs, goal 20lb/year loss. Recommended smart food choices, not a diet; controlled portion sizes and multiple small meals daily; regular exercise.  Problem # 3:  HYPERTENSION (ICD-401.9)  His updated medication list for this problem includes:    Norvasc 10 Mg Tabs (Amlodipine besylate) .Marland Kitchen... Take 1 tablet once a day    Hydrochlorothiazide 25 Mg Tabs (Hydrochlorothiazide) .Marland Kitchen... Take one tablet daily    Enalapril Maleate 20 Mg Tabs (Enalapril maleate) .Marland Kitchen... Take one tablet daily  BP today: 132/89 Prior BP: 124/84 (12/30/2007)  Labs Reviewed: Creat: 0.9 (12/30/2007) Chol: 194 (12/30/2007)   HDL: 36.0 (12/30/2007)   LDL: DEL (12/30/2007)   TG: 574 (12/30/2007)  Adequate control. Plan - continue present meds.   Problem # 4:  HYPERLIPIDEMIA (ICD-272.4)  His updated medication list for this problem includes:    Niaspan 750 Mg Tbcr (Niacin (antihyperlipidemic)) .Marland Kitchen... Take one tablet daily.  Labs Reviewed: Chol: 194 (12/30/2007)   HDL: 36.0 (12/30/2007)   LDL: 73.4 (12/30/2007)   TG: 574 (12/30/2007) SGOT: 180 (06/30/2007)   SGPT: 135 (06/30/2007)  Good control except for tryglycerides.  Will discuss at next office visit.  Problem # 5:  DIABETES MELLITUS, TYPE II (ICD-250.00)  His updated medication list for this problem includes:    Metformin Hcl 1000 Mg Tabs (Metformin hcl) .Marland Kitchen... Take one tablet daily    Aspirin 81 Mg Tabs (Aspirin) .Marland Kitchen... Take 1 tablet by mouth once a day    Enalapril Maleate 20 Mg Tabs (Enalapril maleate) .Marland Kitchen... Take one tablet daily    Glipizide 2.5 Mg Xr24h-tab (Glipizide) .Marland Kitchen... 1 by mouth once daily  Lantus Solostar 100 Unit/ml Soln (Insulin glargine) ..... Use as directed 25 units per day  Labs Reviewed: HgBA1c: 7.2 (12/30/2007)   Creat: 0.9 (12/30/2007)   Microalbumin: 4.2 (12/30/2007)  Adequate control. Plan- continue present regimen and work on obesity issues.   Problem # 6:  Summary Patient with multiple medical problems generally doing well.  Plan: follow-up with appropriate labs and office visit as needed.  Complete Medication List: 1)  Norvasc 10 Mg Tabs (Amlodipine besylate) .... Take 1 tablet once a day 2)  Niaspan 750 Mg Tbcr (Niacin (antihyperlipidemic)) .... Take one tablet daily. 3)  Metformin Hcl 1000 Mg Tabs (Metformin hcl) .... Take one tablet daily 4)  Hydrochlorothiazide 25 Mg Tabs (Hydrochlorothiazide) .... Take one tablet daily 5)  Protonix 40 Mg Pack (Pantoprazole sodium) .... Take one tablet daily 6)  Aspirin 81 Mg Tabs (Aspirin) .... Take 1 tablet by mouth once a day 7)  One-a-day Extras Antioxidant Caps (Multiple vitamins-minerals) 8)  Enalapril Maleate 20 Mg Tabs (Enalapril maleate) .... Take one tablet daily 9)  Fish Oil 1200 Mg Caps (Omega-3 fatty acids) .... Take 5 tablet by mouth once a day 10)  Glipizide 2.5 Mg Xr24h-tab (Glipizide) .Marland Kitchen.. 1 by mouth once daily 11)  Lantus Solostar 100 Unit/ml Soln (Insulin glargine) .... Use as directed 25 units per day    Prescriptions: GLIPIZIDE 2.5 MG  XR24H-TAB (GLIPIZIDE) 1 by mouth once daily  #90 x 3   Entered and Authorized by:   Jacques Navy MD   Signed by:    Jacques Navy MD on 05/31/2008   Method used:   Electron[facility] sent to ...       [facility] [address] [phone]*       [address]       [place], [place]  [address]       Ph: [phone] or [phone]       Fax: [phone]   RxID:   [id number]  ]  Appended Document: NEW/ [facility] / DR. Blossom Hoops TRANSFER/ REFERRED BY BILL YEARN.Marland KitchenMarland Kitchen

## 2010-12-10 NOTE — Letter (Signed)
Summary: Results Follow-up Letter  [facility]  [address] [place], [place] [address]   Phone: [phone]  Fax: [phone]    01/05/2008        Elmarie Mainland. [place] [address] [place], [place]  [address]  Dear Mr. Wadding,   The following are the results of your recent test(s):  Test     Result     Pap Smear    Normal_______  Not Normal_____       Comments: _________________________________________________________ Cholesterol LDL(Bad cholesterol):          Your goal is less than:         HDL (Good cholesterol):        Your goal is more than: _________________________________________________________ Other Tests:   _________________________________________________________  Please call for an appointment Or _________________________________________________________ _________________________________________________________ _________________________________________________________  Sincerely,  Ardyth Man Dodson at [facility]

## 2010-12-10 NOTE — Progress Notes (Signed)
  Phone Note Outgoing Call Call back at [facility] Phone [phone]   Call placed by: Ardyth Man,  July 01, 2007 4:48 PM Call placed to: Patient Summary of Call: [facility] ...................................................................Ardyth Man  July 01, 2007 4:48 PM   Follow-up for Phone Call        Pt. aware and wife aware to call in October for November appt with GI, I called GI and they do not schedule but a month in advance, so pt and wife are aware all info sent and labs faxed to GI ...................................................................Ardyth Man  July 02, 2007 9:23 AM  Follow-up by: Ardyth Man,  July 02, 2007 9:23 AM

## 2010-12-10 NOTE — Progress Notes (Signed)
Summary: refills  Phone Note Refill Request Message from:  Fax from Pharmacy  Refills Requested: Medication #1:  HYDROCHLOROTHIAZIDE 25 MG  TABS Take one tablet daily   Last Refilled: 11/05/2008   Notes: #90  Medication #2:  METFORMIN HCL 1000 MG  TABS Take one tablet daily   Last Refilled: 11/14/2008   Notes: #90  Medication #3:  ENALAPRIL MALEATE 20 MG  TABS take one tablet daily   Last Refilled: 11/05/2008   Notes: #90  Medication #4:  NIASPAN 750 MG  TBCR Take one tablet daily.   Last Refilled: 11/14/2008   Notes: #90 Amlodipine 5mg  #90 last filll 11/05/2008 Pantoprazole 40mg  #90 last fill 11/05/2008  Initial call taken by: Zackery Barefoot CMA,  February 01, 2009 4:12 PM      Prescriptions: ENALAPRIL MALEATE 20 MG  TABS (ENALAPRIL MALEATE) take one tablet daily  #90 x 3   Entered by:   Zackery Barefoot CMA   Authorized by:   Jacques Navy MD   Signed by:   Zackery Barefoot CMA on 02/01/2009   Method used:   [facility] to        [facility] [address] [phone]* (retail)       [address]       [place], [place]  [address]       Ph: [phone] or [phone]       Fax: [phone]   RxID:   [id number] PROTONIX 40 MG  PACK (PANTOPRAZOLE SODIUM) Take one tablet daily  #90 x 3   Entered by:   Zackery Barefoot CMA   Authorized by:   Jacques Navy MD   Signed by:   Zackery Barefoot CMA on 02/01/2009   Method used:   Electronically to        [facility] [address] [phone]* (retail)       [address]       [place], [place]  [address]       Ph: [phone] or [phone]       Fax: [phone]   RxID:   [id number] HYDROCHLOROTHIAZIDE 25 MG  TABS (HYDROCHLOROTHIAZIDE) Take one tablet daily  #90 x 3   Entered by:   Zackery Barefoot CMA   Authorized by:   Jacques Navy MD   Signed by:   Zackery Barefoot CMA on 02/01/2009   Method used:   Electronically to        [facility] [address] [phone]*  (retail)       [address]       [place], [place]  [address]       Ph: [phone] or [phone]       Fax: [phone]   RxID:   [id number] METFORMIN HCL 1000 MG  TABS (METFORMIN HCL) Take one tablet daily  #90 x 3   Entered by:   Zackery Barefoot CMA   Authorized by:   Jacques Navy MD   Signed by:   Zackery Barefoot CMA on 02/01/2009   Method used:   Electronically to        [facility] [address] [phone]* (retail)       [address]       [place], [place]  [address]       Ph: [phone] or [phone]  Fax: [phone]   RxID:   [id number] NIASPAN 750 MG  TBCR (NIACIN (ANTIHYPERLIPIDEMIC)) Take one tablet daily.  #90 x 3   Entered by:   Zackery Barefoot CMA   Authorized by:   Jacques Navy MD   Signed by:   Zackery Barefoot CMA on 02/01/2009   Method used:   Electronically to        [facility] [address] [phone]* (retail)       [address]       [place], [place]  [address]       Ph: [phone] or [phone]       Fax: [phone]   RxID:   [id number] NORVASC 10 MG TABS (AMLODIPINE BESYLATE) Take 1 tablet once a day  #90 x 3   Entered by:   Zackery Barefoot CMA   Authorized by:   Jacques Navy MD   Signed by:   Zackery Barefoot CMA on 02/01/2009   Method used:   Electronically to        [facility] [address] [phone]* (retail)       [address]       [place], [place]  [address]       Ph: [phone] or [phone]       Fax: [phone]   RxID:   [id number]

## 2010-12-10 NOTE — Progress Notes (Signed)
  Phone Note Refill Request Message from:  Fax from Pharmacy on October 11, 2010 11:25 AM  Refills Requested: Medication #1:  LANTUS SOLOSTAR 100 UNIT/ML  SOLN 100 units q hs Initial call taken by: Ami Bullins CMA,  October 11, 2010 11:25 AM    Prescriptions: LANTUS SOLOSTAR 100 UNIT/ML  SOLN (INSULIN GLARGINE) 100 units q hs  #3 mth supply x 3   Entered by:   Ami Bullins CMA   Authorized by:   Jacques Navy MD   Signed by:   Bill Salinas CMA on 10/11/2010   Method used:   Electronically to        [facility] [address] [phone]* (retail)       [address]       [place], [place]  [address]       Ph: [phone] or [phone]       Fax: [phone]   RxID:   [id number]

## 2010-12-10 NOTE — Progress Notes (Signed)
Summary: Foot issue  Phone Note Call from Patient Call back at Work Phone [phone]   Summary of Call: Patient is diabetic and called c/o blister on left foot that is not healing properly. It is a blister located at the crack of his toe/bend of the toes and has a callus also. This started at Christmas, but soaking, neosporin, and peroxide has not helped. Patient would like to know if he should go see a specialist. Please advise. Initial call taken by: Lucious Groves,  December 26, 2009 10:36 AM  Follow-up for Phone Call        needs to see me in th eoffice before I know if he will need wound center consult. Follow-up by: Jacques Navy MD,  December 26, 2009 3:26 PM  Additional Follow-up for Phone Call Additional follow up Details #1::        Patient notified. Additional Follow-up by: Lucious Groves,  December 26, 2009 3:40 PM

## 2010-12-10 NOTE — Assessment & Plan Note (Signed)
Summary: talk about his diabetes and bp and others   ph   Vital Signs:  Patient Profile:   55 Years Old [name] Weight:      329 pounds Temp:     98.0 degrees F oral Pulse rate:   74 / minute Resp:     18 per minute BP sitting:   130 / 90  (right arm)  Pt. in pain?   no  Vitals Entered By: Ardyth Man (June 30, 2007 11:31 AM)                Chief Complaint:  both feet tingling and numb, but left is the worst one, diabetes, bs this morning is 131, and Type 2 diabetes mellitus follow-up.  History of Present Illness:  Type 2 Diabetes Mellitus Follow-Up      This is a 55 year old man who presents for Type 2 diabetes mellitus follow-up.  The patient denies polyuria, polydipsia, and weight gain.  The patient denies the following symptoms: chest pain.  Since the last visit the patient reports monitoring blood glucose.  The patient has been measuring capillary blood glucose before breakfast.  Since the last visit, the patient reports having had eye care by an ophthalmologist.  Reports he has noted some tingling sensation of his feet. L>R. Not worsening.Denies weakness.  Hypertension Follow-Up      The patient also presents for Hypertension follow-up.  The patient denies impotence and fatigue.  The patient denies the following associated symptoms: chest pressure.  Compliance with medications (by patient report) has been near good. Ran out of medications several days ago, but will pickup today. The patient reports that dietary compliance has been good.    Hyperlipidemia Follow-Up      The patient also presents for Hyperlipidemia follow-up.  The patient denies muscle aches and GI upset.  Dietary compliance has been fair.  Adjunctive measures currently used by the patient include niacin.          Physical Exam  General:     Obesity,well-nourished and well-hydrated.   Neck:     No deformities, masses, or tenderness noted. Lungs:     Normal respiratory effort, chest expands  symmetrically. Lungs are clear to auscultation, no crackles or wheezes. Heart:     Normal rate and regular rhythm. S1 and S2 normal without gallop, murmur, click, rub or other extra sounds.  Diabetes Management Exam:    Foot Exam (with socks and/or shoes not present):       Sensory-Pinprick/Light touch:          Left medial foot (L-4): normal          Left dorsal foot (L-5): normal          Left lateral foot (S-1): normal          Right medial foot (L-4): normal          Right dorsal foot (L-5): normal          Right lateral foot (S-1): normal       Sensory-Monofilament:          Left foot: normal          Right foot: normal       Inspection:          Left foot: normal          Right foot: normal       Nails:          Left foot: normal  Right foot: normal    Impression & Recommendations:  Problem # 1:  DIABETES MELLITUS, TYPE II (ICD-250.00) Check labs today Adjust medications after review if needed. F/u in 3 months. His updated medication list for this problem includes:    Metformin Hcl 1000 Mg Tabs (Metformin hcl) .Marland Kitchen... Take one tablet daily    Bayer Aspirin 325 Mg Tabs (Aspirin) .Marland Kitchen... Take one tablet daily  Orders: TLB-Lipid Panel (80061-LIPID) TLB-ALT (SGPT) (84460-ALT) TLB-AST (SGOT) (84450-SGOT) TLB-BMP (Basic Metabolic Panel-BMET) (80048-METABOL) TLB-A1C / Hgb A1C (Glycohemoglobin) (83036-A1C) TLB-Microalbumin/Creat Ratio, Urine (82043-MALB)  Labs Reviewed: HgBA1c: 7.4 (12/23/2006)   Creat: 0.8 (12/23/2006)      Problem # 2:  HYPERLIPIDEMIA (ICD-272.4) Currently not on statin due to elevated LFTs. Followed by [facility]. His updated medication list for this problem includes:    Niaspan 750 Mg Tbcr (Niacin (antihyperlipidemic)) .Marland Kitchen... Take one tablet daily.  Labs Reviewed: Chol: 144 (11/18/2006)   HDL: 31.5 (11/18/2006)   LDL: DEL (11/18/2006)   TG: 449 (11/18/2006) SGOT: 113 (11/18/2006)   SGPT: 148 (11/18/2006)   Problem # 3:  HYPERTENSION  (ICD-401.9) Borderline today. Patient will monitor at home.If blood pressure greater than 130/90, he will f/u. His updated medication list for this problem includes:    Norvasc 10 Mg Tabs (Amlodipine besylate) .Marland Kitchen... Take 1 tablet once a day    Hydrochlorothiazide 25 Mg Tabs (Hydrochlorothiazide) .Marland Kitchen... Take one tablet daily  BP today: 130/90  Labs Reviewed: Creat: 0.8 (12/23/2006) Chol: 144 (11/18/2006)   HDL: 31.5 (11/18/2006)   LDL: DEL (11/18/2006)   TG: 449 (11/18/2006)   Problem # 4:  DIABETIC PERIPHERAL NEUROPATHY (ICD-250.60) Reiterated importance of good diabetes control. Will consider medication if symptoms significant His updated medication list for this problem includes:    Metformin Hcl 1000 Mg Tabs (Metformin hcl) .Marland Kitchen... Take one tablet daily    Bayer Aspirin 325 Mg Tabs (Aspirin) .Marland Kitchen... Take one tablet daily   Complete Medication List: 1)  Norvasc 10 Mg Tabs (Amlodipine besylate) .... Take 1 tablet once a day 2)  Niaspan 750 Mg Tbcr (Niacin (antihyperlipidemic)) .... Take one tablet daily. 3)  Metformin Hcl 1000 Mg Tabs (Metformin hcl) .... Take one tablet daily 4)  Hydrochlorothiazide 25 Mg Tabs (Hydrochlorothiazide) .... Take one tablet daily 5)  Protonix 40 Mg Pack (Pantoprazole sodium) .... Take one tablet daily 6)  Bayer Aspirin 325 Mg Tabs (Aspirin) .... Take one tablet daily 7)  One-a-day Extras Antioxidant Caps (Multiple vitamins-minerals)

## 2010-12-10 NOTE — Progress Notes (Signed)
Summary: SORE ON FOOT  Phone Note Call from Patient   Summary of Call: Pt called stating he has an open wound on Left foot X2 1/2 weeks that won't heal. Pt is diabetic. Pt would like a referral to a foot specialist so he doesn't lose his foot. Please advise? Initial call taken by: Josph Macho CMA,  November 29, 2009 10:54 AM  Follow-up for Phone Call        Needs OV today or tomorrow to check his foot.  Follow-up by: Jacques Navy MD,  November 29, 2009 1:08 PM  Additional Follow-up for Phone Call Additional follow up Details #1::        Left message at home and work number to call office. Additional Follow-up by: Josph Macho CMA,  November 29, 2009 3:30 PM    Additional Follow-up for Phone Call Additional follow up Details #2::    Hm # busy..........................Marland KitchenLamar Sprinkles, CMA  November 29, 2009 6:10 PM  left mess to call office back on home and work #..........................Marland KitchenLamar Sprinkles, CMA  November 30, 2009 9:41 AM   Left vm on hm# to call for sat clinic apt or office visit with Dr Debby Bud monday...........................Marland KitchenLamar Sprinkles, CMA  November 30, 2009 6:16 PM   left vm to call office back, no return call, letter mailed.......................Marland KitchenLamar Sprinkles, CMA  December 03, 2009 12:01 PM

## 2010-12-10 NOTE — Letter (Signed)
[facility] [address] [place], [place]  [address] Phone: [phone]      September 09, 2010   [facility] [address] [place], [place] [address]  RE:  LAB RESULTS  Dear  [name],  The following is an interpretation of your most recent lab tests.  Please take note of any instructions provided or changes to medications that have resulted from your lab work.  ELECTROLYTES:  Good - no changes needed  KIDNEY FUNCTION TESTS:  Good - no changes needed  LIVER FUNCTION TESTS:  Good - no changes needed  LIPID PANEL:  Stable - no changes needed Triglyceride: 319.0   Cholesterol: 150   LDL: DEL   HDL: 34.20   Chol/HDL%:  4   DIABETIC STUDIES:  Excellent - no changes needed Blood Glucose: 84   HgbA1C: 6.3   Microalbumin/Creatinine Ratio: 79.1     Lab results look good except for elevated triglycerides. This elevation can be addressed by better diet management.  Please come see me if you have any questions about these lab results.   Sincerely Yours,    Jacques Navy MD  Patient: [name] [name] Note: All result statuses are Final unless otherwise noted.  Tests: (1) Hemoglobin A1C (A1C)   Hemoglobin A1C            6.3 %                       4.6-6.5     Glycemic Control Guidelines for People with Diabetes:     Non Diabetic:  <6%     Goal of Therapy: <7%     Additional Action Suggested:  >8%   Tests: (2) Hepatic/Liver Function Panel (HEPATIC)   Total Bilirubin           1.0 mg/dL                   6.4-4.0   Direct Bilirubin          0.1 mg/dL                   3.4-7.4   Alkaline Phosphatase      58 U/L                      39-117   AST                       25 U/L                      0-37   ALT                       26 U/L                      0-53   Total Protein             7.1 g/dL                    2.5-9.5   Albumin                   3.9 g/dL                    6.3-8.7  Tests: (3) Lipid Panel (LIPID)   Cholesterol  150  mg/dL                   1-610     ATP III Classification            Desirable:  < 200 mg/dL                    Borderline High:  200 - 239 mg/dL               High:  > = 240 mg/dL   Triglycerides        [H]  319.0 mg/dL                 9.6-045.4     Normal:  <150 mg/dL     Borderline High:  098 - 199 mg/dL   HDL                  [L]  11.91 mg/dL                 >47.82   VLDL Cholesterol     [H]  63.8 mg/dL                  9.5-62.1  CHO/HDL Ratio:  CHD Risk                             4                    Men          Women     1/2 Average Risk     3.4          3.3     Average Risk          5.0          4.4     2X Average Risk          9.6          7.1     3X Average Risk          15.0          11.0                           Tests: (4) BMP (METABOL)   Sodium                    138 mEq/L                   135-145   Potassium                 4.4 mEq/L                   3.5-5.1   Chloride                  103 mEq/L                   96-112   Carbon Dioxide            25 mEq/L                    19-32   Glucose                   84 mg/dL  70-99   BUN                       18 mg/dL                    1-61   Creatinine                0.6 mg/dL                   0.9-6.0   Calcium                   9.4 mg/dL                   4.5-40.9   GFR                       143.05 mL/min               >60  Tests: (5) Cholesterol LDL - Direct (DIRLDL)  Cholesterol LDL - Direct                             66.7 mg/dL     Optimal:  <811 mg/dL     Near or Above Optimal:  100-129 mg/dL     Borderline High:  914-782 mg/dL     High:  956-213 mg/dL     Very High:  >086 mg/dL

## 2010-12-10 NOTE — Letter (Signed)
Summary: [facility]   Imported By: Lester Taylor 04/11/2010 10:48:55  _____________________________________________________________________  External Attachment:    Type:   Image     Comment:   External Document

## 2010-12-10 NOTE — Assessment & Plan Note (Signed)
Summary: FU PER DR Yetta Barre Natale Milch   Vital Signs:  Patient profile:   55 year old male Height:      76 inches Weight:      354.50 pounds BMI:     43.31 O2 Sat:      96 % on Room air Temp:     99.2 degrees F oral Pulse rate:   106 / minute BP sitting:   120 / 82  (left arm) Cuff size:   large  Vitals Entered By: Beola Cord, CMA (June 20, 2009 11:00 AM)  O2 Flow:  Room air CC: f/u on previous labs   Primary Care Cherisse Carrell:  [facility]ins  CC:  f/u on previous labs.  History of Present Illness: [name] is a very friendly 55 y/o with a history of DM Type II, HTN, and hyperlipidemia who saw Dr. Yetta Barre for a physical on 04/05/09 and had abnormal lipids.  He presents today for follow up labs.  He has no acute complaint/illness.    1)  DM: Patient monitors glucose twice daily, prior to eating in the morning and before he goes to bed.  Pt. reports these levels to be very consistent and between 120-130.  If he checks soon after eating, they climb as high as 140-150, but not higher.  He is currently taking metformin (1000 mg once daily) and glipizide (2.5 mg once daily), and takes his glargine (100 U) before bedtime.  He says this regimen is going well and is stable.    2) Hypertension: Pt. currently taking enalapril (20 mg) and hydrochlorothiazide.  He says occasionally he does notice when his BP spikes, but it has been stable lately.    3) Hyperlipidemia: Patient is now taking Niaspan 2,000 mg each morning, which first caused skin flushing, but he is now tolerating this medication well.    4) Obesity: Patient says he is eating better.  He eats very little red meat and fried foods, and he avoids starches.  He looks [place] (swimming) will be easier.    Current Medications (verified): 1)  Norvasc 10 Mg Tabs (Amlodipine Besylate) .... Take 1 Tablet Once A Day 2)  Niaspan 1000 Mg Cr-Tabs (Niacin  (Antihyperlipidemic)) .... 2 Once Daily 3)  Metformin Hcl 1000 Mg  Tabs (Metformin Hcl) .... Take One Tablet Daily 4)  Hydrochlorothiazide 25 Mg  Tabs (Hydrochlorothiazide) .... Take One Tablet Daily 5)  Protonix 40 Mg  Pack (Pantoprazole Sodium) .... Take One Tablet Daily 6)  Aspirin 81 Mg  Tabs (Aspirin) .... Take 1 Tablet By Mouth Once A Day 7)  One-A-Day Extras Antioxidant   Caps (Multiple Vitamins-Minerals) 8)  Enalapril Maleate 20 Mg  Tabs (Enalapril Maleate) .... Take One Tablet Daily 9)  Fish Oil 1200 Mg  Caps (Omega-3 Fatty Acids) .... Take 5 Tablet By Mouth Once A Day 10)  Glipizide 2.5 Mg  Xr24h-Tab (Glipizide) .Marland Kitchen.. 1 By Mouth Once Daily 11)  Lantus Solostar 100 Unit/ml  Soln (Insulin Glargine) .Marland Kitchen.. 100 Units Q Hs 12)  Minocycline Hcl 100 Mg Caps (Minocycline Hcl) .... Once Daily X 3-73mos 13)  Synthroid 125 Mcg Tabs (Levothyroxine Sodium) .... Once Daily  Allergies (verified): 1)  ! Pcn  Past History:  Past medical, surgical, family and social histories (including risk factors) reviewed, and no changes noted (except as noted below).  Past Medical History: Reviewed history from 05/30/2008 and no changes required. 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 arthroscopy x 2 right arthroscopy x 1 left ([name]) Ganglion cyst-right wrist (Kuzma) correction of deviated septum tonsilectomy - remote pilonidal cyst - at 55 y/o  Family History: Reviewed history from 05/30/2008 and no changes required. father-deceased @55 : metatstatic cancer to Brain, CAD/ MI x 2  03-27-2040, 45) mother - deceased @82 : cancer rapid on-set Neg - colon or prostate cancer; DM:  Social History: Reviewed history from 05/30/2008 and no changes required. [facility] [place] - 2years. 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 Marriage in good health.  Review of Systems       Pt. denies headache, visual disturbance, unusual weight change, numbness or tingling.  Full ROS taken 04/05/09.    Physical Exam  General:  alert, well-hydrated, and overweight-appearing.   Head:  no abnormalities observed.   Eyes:  vision grossly intact, pupils equal, and pupils round.   Lungs:  normal respiratory effort, no accessory muscle use, no crackles, and no wheezes.   Heart:  normal rate, regular rhythm, no murmur, no gallop, and no rub.   Extremities:  No edema/cyanosis.   Skin:  turgor normal, color normal, and no ulcerations.   Heavily calluses on heels, but no open wound/bleeding.    Diabetes Management Exam:    Foot Exam (with socks and/or shoes not present):       Sensory-Pinprick/Light touch:          Left medial foot (L-4): normal          Left dorsal foot (L-5): normal          Left lateral foot (S-1): normal          Right medial foot (L-4): normal          Right dorsal foot (L-5): normal          Right lateral foot (S-1): normal       Sensory-Monofilament:          Left foot: normal          Right foot: normal       Inspection:          Left foot: normal          Right foot: normal   Impression & Recommendations:  Problem # 1:  DIABETES MELLITUS, TYPE II (ICD-250.00) Pt. HgbA1c 8.9% (was 7.2% 12/2007).  By his report, his fasting morning glucose is usually 120-130, so lantus dose is appropriate.   Plan: Increase metformin to 1000 mg twice daily.  Keep Lantus and glipizide doses the same.    His updated medication list for this problem includes:    Metformin Hcl 1000 Mg Tabs (Metformin hcl) .Marland Kitchen... Take one tablet twice daily    Glipizide 2.5 Mg Xr24h-tab (Glipizide) .Marland Kitchen... 1 by mouth once daily    Lantus Solostar 100 Unit/ml Soln (Insulin glargine) .Marland KitchenMarland KitchenMarland KitchenMarland Kitchen 100 units q hs  Problem # 2:  HYPERLIPIDEMIA (ICD-272.4) Pt. has recently been tolerating  Niacin well.   LDL is up from 73.4 (04/02/09) to 96.8, which is below target.   HDL is up from 16.4 (04/02/09) to 40, which is at target.   Triglycerides down from 2700 (04/02/09) to 414, which is still elevated but acceptable.   Total cholesterol down from 375 (04/02/09) to 185, which is normal. Alk Phos (  128), AST (127) and ALT (109) now elevated more than previous visit 04/02/09 (82, 82, 85, respectively).    Plan: Continue present therapy with Niaspan, 2x1,000 mg pills in morning.  Initiate regular exercise and watch diet.  Return to clinic for labs in 3 months for f/u of liver function test and lipid panel, but no doctor appt. required.  Results will be reported by phone.    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 foot exam was normal.    Plan: Continue diabetes management, and continue follow up.    Problem # 4:  OBESITY (ICD-278.00) Pt. BMI 43.3 today, which is about the same from 05/2008 (42).    Plan: Reduce intake of fried foods and foods heavy in saturated fat.  Avoid many starches.  Initiate regular exercise.    Problem # 5:  HYPOTHYROIDISM (ICD-244.9) Pt. TSH is 5.97 (04/02/09) ok.  Plan: Maintain Synthroid dose as is.   His updated medication list for this problem includes:    Synthroid 125 Mcg Tabs (Levothyroxine sodium) ..... Once daily  His updated medication list for this problem includes:    Synthroid 125 Mcg Tabs (Levothyroxine sodium) ..... Once daily  Medications Added to Medication List This Visit: 1)  Metformin Hcl 1000 Mg Tabs (Metformin hcl) .... Take one tablet twice daily  Complete Medication List: 1)  Norvasc 10 Mg Tabs (Amlodipine besylate) .... Take 1 tablet once a day 2)  Niaspan 1000 Mg Cr-tabs (Niacin (antihyperlipidemic)) .... 2 once daily 3)  Metformin Hcl 1000 Mg Tabs (Metformin hcl) .... Take one tablet twice daily 4)  Hydrochlorothiazide 25 Mg Tabs (Hydrochlorothiazide) .... Take one tablet daily 5)  Protonix 40 Mg Pack  (Pantoprazole sodium) .... Take one tablet daily 6)  Aspirin 81 Mg Tabs (Aspirin) .... Take 1 tablet by mouth once a day 7)  One-a-day Extras Antioxidant Caps (Multiple vitamins-minerals) 8)  Enalapril Maleate 20 Mg Tabs (Enalapril maleate) .... Take one tablet daily 9)  Fish Oil 1200 Mg Caps (Omega-3 fatty acids) .... Take 5 tablet by mouth once a day 10)  Glipizide 2.5 Mg Xr24h-tab (Glipizide) .Marland Kitchen.. 1 by mouth once daily 11)  Lantus Solostar 100 Unit/ml Soln (Insulin glargine) .Marland Kitchen.. 100 units q hs 12)  Minocycline Hcl 100 Mg Caps (Minocycline hcl) .... Once daily x 3-40mos 13)  Synthroid 125 Mcg Tabs (Levothyroxine sodium) .... Once daily

## 2010-12-10 NOTE — Progress Notes (Signed)
   Phone Note Outgoing Call Call back at [facility] Phone [phone]   Call placed by: Ardyth Man,  January 05, 2008 2:01 PM Call placed to: Patient Summary of Call: Left message for patient to call the office.  (rx sent electronically to pharmacy already) see last lab append. ...................................................................Ardyth Man  January 05, 2008 2:01 PM   Follow-up for Phone Call        Patient aware of results and recommendations and mailed copy of labs.  RX sent electronically ...................................................................Ardyth Man  January 05, 2008 3:06 PM  Follow-up by: Ardyth Man,  January 05, 2008 3:06 PM

## 2010-12-10 NOTE — Assessment & Plan Note (Signed)
Summary: POST SURGERY/ FU ON MEDS/NWS  #   Vital Signs:  Patient profile:   55 year old male Height:      76 inches Weight:      336 pounds BMI:     41.05 O2 Sat:      96 % on Room air Temp:     97.2 degrees F oral Pulse rate:   94 / minute BP sitting:   102 / 68  (left arm) Cuff size:   large  Vitals Entered By: Bill Salinas CMA (July 04, 2010 10:59 AM)  O2 Flow:  Room air CC: pt here for follow up after lap ban surg/ ab   Primary Care Provider:  [facility]ins  CC:  pt here for follow up after lap ban surg/ ab.  History of Present Illness: Patient present s/p lap band procedure. He feels great. He has already lost 30+ lbs. He has had no adverse effects from surgery and his wounds are healing well. He continues taking his medications and does report good CBG control. His blood pressure has also been stable.   He does report that his chronic sleep problem was helped when he tried a friends Halcion. He is also interested in using cialis.  Current Medications (verified): 1)  Norvasc 10 Mg Tabs (Amlodipine Besylate) .... Take 1 Tablet Once A Day 2)  Niaspan 1000 Mg Cr-Tabs (Niacin (Antihyperlipidemic)) .... 2 Once Daily 3)  Metformin Hcl 1000 Mg  Tabs (Metformin Hcl) .... Take One Tablet Twice Daily 4)  Hydrochlorothiazide 25 Mg  Tabs (Hydrochlorothiazide) .... Take One Tablet Daily 5)  Protonix 40 Mg  Pack (Pantoprazole Sodium) .... Take One Tablet Daily 6)  Aspirin 81 Mg  Tabs (Aspirin) .... Take 1 Tablet By Mouth Once A Day 7)  One-A-Day Extras Antioxidant   Caps (Multiple Vitamins-Minerals) 8)  Enalapril Maleate 20 Mg  Tabs (Enalapril Maleate) .... Take One Tablet Daily 9)  Fish Oil 1200 Mg  Caps (Omega-3 Fatty Acids) .... Take 5 Tablet By Mouth Once A Day 10)  Glipizide 2.5 Mg  Xr24h-Tab (Glipizide) .Marland Kitchen.. 1 By Mouth Once Daily 11)  Lantus Solostar 100 Unit/ml  Soln (Insulin Glargine) .Marland Kitchen.. 100 Units Q Hs 12)  Minocycline Hcl 100 Mg Caps (Minocycline Hcl) .... Once Daily X  3-71mos 13)  Synthroid 125 Mcg Tabs (Levothyroxine Sodium) .... Once Daily  Allergies (verified): 1)  ! 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  The patient denies anorexia, fever, vision loss, chest pain, dyspnea on exertion, headaches, abdominal pain, difficulty walking, depression, and enlarged lymph nodes.    Physical Exam  General:  Large framed overweight white male in no distress. Head:  Normocephalic and atraumatic without obvious abnormalities. No apparent alopecia or balding. Eyes:  C&S clear Lungs:  normal respiratory effort.   Heart:  normal rate and regular rhythm.   Abdomen:  Obese. Well healing laparoscopy scars. No tenderness at Lap Band port. Msk:  No deformity or scoliosis noted of thoracic or lumbar spine.   Neurologic:  alert & oriented X3, cranial nerves II-XII intact, gait normal, and  DTRs symmetrical and normal.   Skin:  turgor normal, color normal, and no rashes.   Psych:  Oriented X3, memory intact for recent and remote, normally interactive, and good eye contact.     Impression & Recommendations:  Problem # 1:  OBESITY (ICD-278.00) s/p  bariatric surgery with expected weight loss. He does follow a conscientious diet which he intendes to continue  Problem # 2:  HYPERTENSION (ICD-401.9)  His updated medication list for this problem includes:    Norvasc 10 Mg Tabs (Amlodipine besylate) .Marland Kitchen... Take 1 tablet once a day    Hydrochlorothiazide 25 Mg Tabs (Hydrochlorothiazide) .Marland Kitchen... Take one  tablet daily    Enalapril Maleate 20 Mg Tabs (Enalapril maleate) .Marland Kitchen... Take one tablet daily  BP today: 102/68 Prior BP: 108/70 (12/27/2009)  Prior 10 Yr Risk Heart Disease: Not enough information (04/05/2009)  Labs Reviewed: K+: 4.2 (07/01/2010) Creat: : 0.7 (07/01/2010)     Very good control . May be able to reduce medication, i.e. stop norvasc, if his pressure continue to be so well controlled.   Problem # 3:  DIABETES MELLITUS, TYPE II (ICD-250.00) By report CBGs are much better.  Plan - downward titration of lantus: checking fasting CBGs - if less than 120 3-5 days in a row reduce lantus by 5 units until at stable control.  His updated medication list for this problem includes:    Metformin Hcl 1000 Mg Tabs (Metformin hcl) .Marland Kitchen... Take one tablet twice daily    Aspirin 81 Mg Tabs (Aspirin) .Marland Kitchen... Take 1 tablet by mouth once a day    Enalapril Maleate 20 Mg Tabs (Enalapril maleate) .Marland Kitchen... Take one tablet daily    Glipizide 2.5 Mg Xr24h-tab (Glipizide) .Marland Kitchen... 1 by mouth once daily    Lantus Solostar 100 Unit/ml Soln (Insulin glargine) .Marland KitchenMarland KitchenMarland KitchenMarland Kitchen 100 units q hs  Problem # 4:  INSOMNIA (ICD-780.52) Periodic sleep trouble.  Plan - Halcion as needed   His updated medication list for this problem includes:    Halcion 0.25 Mg Tabs (Triazolam) .Marland Kitchen... 1 by mouth at bedtime as needed for sleep  Complete Medication List: 1)  Norvasc 10 Mg Tabs (Amlodipine besylate) .... Take 1 tablet once a day 2)  Niaspan 1000 Mg Cr-tabs (Niacin (antihyperlipidemic)) .... 2 once daily 3)  Metformin Hcl 1000 Mg Tabs (Metformin hcl) .... Take one tablet twice daily 4)  Hydrochlorothiazide 25 Mg Tabs (Hydrochlorothiazide) .... Take one tablet daily 5)  Protonix 40 Mg Pack (Pantoprazole sodium) .... Take one tablet daily 6)  Aspirin 81 Mg Tabs (Aspirin) .... Take 1 tablet by mouth once a day 7)  One-a-day Extras Antioxidant Caps (Multiple vitamins-minerals) 8)  Enalapril Maleate 20 Mg Tabs (Enalapril maleate)  .... Take one tablet daily 9)  Fish Oil 1200 Mg Caps (Omega-3 fatty acids) .... Take 5 tablet by mouth once a day 10)  Glipizide 2.5 Mg Xr24h-tab (Glipizide) .Marland Kitchen.. 1 by mouth once daily 11)  Lantus Solostar 100 Unit/ml Soln (Insulin glargine) .Marland Kitchen.. 100 units q hs 12)  Cialis 20 Mg Tabs (Tadalafil) .Marland Kitchen.. 1 by mouth prn 13)  Halcion 0.25 Mg Tabs (Triazolam) .Marland Kitchen.. 1 by mouth at bedtime as needed for sleep  Yordan Riechers Note: All result statuses are Final unless otherwise noted.  Tests: (1) TSH (TSH)   FastTSH                   1.77 uIU/mL                 0.35-5.50  Tests: (2) Hepatic/Liver Function Panel (HEPATIC)   Total Bilirubin           0.8 mg/dL                   2.5-9.5   Direct Bilirubin          0.1 mg/dL                   6.3-8.7   Alkaline Phosphatase      44 U/L                      39-117   AST                  [H]  44 U/L                      0-37   ALT                  [H]  59 U/L                      0-53   Total Protein             7.2 g/dL                    5.6-4.3   Albumin                   4.1 g/dL                    3.2-9.5  Tests: (3) BMP (METABOL)   Sodium                    142 mEq/L                   135-145   Potassium                 4.2 mEq/L                   3.5-5.1   Chloride                  103 mEq/L                   96-112   Carbon Dioxide            26 mEq/L                    19-32   Glucose                   71 mg/dL                    18-84   BUN                       14 mg/dL                    1-66   Creatinine                0.7 mg/dL                   0.6-3.0   Calcium                   9.5 mg/dL  8.4-10.5   GFR                       126.52 mL/min               >60  Tests: (4) Lipid Panel (LIPID)   Cholesterol               125 mg/dL                   1-027     ATP III Classification            Desirable:  < 200 mg/dL                    Borderline High:  200 - 239 mg/dL               High:  > = 240 mg/dL   Triglycerides         [H]  204.0 mg/dL                 2.5-366.4     Normal:  <150 mg/dL     Borderline High:  403 - 199 mg/dL   HDL                  [L]  47.42 mg/dL                 >59.56   VLDL Cholesterol     [H]  40.8 mg/dL                  3.8-75.6  CHO/HDL Ratio:  CHD Risk                             4                    Men          Women     1/2 Average Risk     3.4          3.3     Average Risk          5.0          4.4     2X Average Risk          9.6          7.1     3X Average Risk          15.0          11.0                           Tests: (5) Hemoglobin A1C (A1C)   Hemoglobin A1C       [H]  7.9 %                       4.6-6.5     Glycemic Control Guidelines for People with Diabetes:     Non Diabetic:  <6%     Goal of Therapy: <7%     Additional Action Suggested:  >8% Prescriptions: HALCION 0.25 MG TABS (TRIAZOLAM) 1 by mouth at bedtime as needed for sleep  #30 x 5   Entered and Authorized by:   Jacques Navy MD   Signed by:   Jacques Navy MD on 07/04/2010   Method used:   Print then  Give to Patient   RxID:   [id number] CIALIS 20 MG TABS (TADALAFIL) 1 by mouth prn  #6 x 12   Entered and Authorized by:   Jacques Navy MD   Signed by:   Jacques Navy MD on 07/04/2010   Method used:   Electronically to        [facility] [address] [phone]* (retail)       [address]       [place], [place]  [address]       Ph: [phone] or [phone]       Fax: [phone]   RxID:   [id number]

## 2010-12-10 NOTE — Letter (Signed)
Summary: [place]  EAR NOSE THRO[facility]  [place]  EAR NOSE THROAT   Imported By: Freddy Jaksch 09/10/2007 11:03:37  _____________________________________________________________________  External Attachment:    Type:   Image     Comment:   External Document

## 2010-12-10 NOTE — Progress Notes (Signed)
Summary: ABNORMAL LABS  ---- Converted from flag ---- ---- 04/05/2009 7:41 AM, Etta Grandchild MD wrote: His labs are dangerously abnormal and he heeds to be seen asap.  TJ ------------------------------  Phone Note From Other Clinic   Summary of Call: Pt aware abnormal labs, per DR Yetta Barre pt needs office visit asap. Pt scheduled for cpx tomorrow. Per Dr Yetta Barre pt needs apt today. He is scheduled for office visit w/Dr Yetta Barre today at 2:00.  Initial call taken by: Lamar Sprinkles,  Apr 05, 2009 9:09 AM  Follow-up for Phone Call        OK Follow-up by: Jacques Navy MD,  Apr 05, 2009 12:58 PM

## 2010-12-10 NOTE — Letter (Signed)
Summary: [facility]   Imported By: Lester Wilburton Number One 07/16/2010 10:02:35  _____________________________________________________________________  External Attachment:    Type:   Image     Comment:   External Document

## 2010-12-10 NOTE — Progress Notes (Signed)
  Phone Note Refill Request Message from:  Fax from Pharmacy on August 01, 2010 10:33 AM  Refills Requested: Medication #1:  GLIPIZIDE 2.5 MG  XR24H-TAB 1 by mouth once daily   Last Refilled: 04/20/2010 Initial call taken by: Ami Bullins CMA,  August 01, 2010 10:33 AM    Prescriptions: GLIPIZIDE 2.5 MG  XR24H-TAB (GLIPIZIDE) 1 by mouth once daily  #90 x 2   Entered by:   Ami Bullins CMA   Authorized by:   Jacques Navy MD   Signed by:   Bill Salinas CMA on 08/01/2010   Method used:   [facility]        [facility] [address] [phone]* (retail)       [address]       [place], [place]  [address]       Ph: [phone] or [phone]       Fax: [phone]   RxID:   [id number]

## 2010-12-10 NOTE — Progress Notes (Signed)
    Immunization History:  Influenza Immunization History:    Influenza:  historical (08/06/2009)

## 2010-12-10 NOTE — Progress Notes (Signed)
  Medications Added GLUCOTROL XL 2.5 MG  TB24 (GLIPIZIDE) Take one tablet daily       Phone Note Outgoing Call Call back at [facility] Phone [phone]   Call placed by: Ardyth Man,  January 05, 2008 1:59 PM Call placed to: Patient Summary of Call: Left message for patient to return call.  (See last append). ...................................................................Ardyth Man  January 05, 2008 2:00 PM     New/Updated Medications: GLUCOTROL XL 2.5 MG  TB24 (GLIPIZIDE) Take one tablet daily   Prescriptions: GLUCOTROL XL 2.5 MG  TB24 (GLIPIZIDE) Take one tablet daily  #30 x 2   Entered by:   Ardyth Man   Authorized by:   Leanne Chang MD   Signed by:   Ardyth Man on 01/05/2008   Method used:   Electronically sent to ...       [facility] [address] [id number]*       [address]       [place], [place]  [address]       Ph: [phone]       Fax: [phone]   RxID:   [id number]

## 2010-12-12 NOTE — Progress Notes (Signed)
  Phone Note Refill Request Message from:  Fax from Pharmacy on November 28, 2010 5:41 PM  Refills Requested: Medication #1:  NIASPAN 1000 MG CR-TABS 2 once daily Initial call taken by: Ami Bullins CMA,  November 28, 2010 5:41 PM    Prescriptions: NIASPAN 1000 MG CR-TABS (NIACIN (ANTIHYPERLIPIDEMIC)) 2 once daily  #60 Tablet x 3   Entered by:   Ami Bullins CMA   Authorized by:   Jacques Navy MD   Signed by:   Bill Salinas CMA on 11/28/2010   Method used:   [facility]        [facility] [address] [phone][facility] (retail)       [address]       [place], [place]  [address]       Ph: [phone] or [phone]       Fax: [phone]   RxID:   [id number]

## 2011-01-08 ENCOUNTER — Telehealth: Payer: Self-pay | Admitting: Internal Medicine

## 2011-01-16 NOTE — Progress Notes (Signed)
  Phone Note Refill Request Message from:  Fax from Pharmacy on January 08, 2011 10:41 AM  Refills Requested: Medication #1:  HALCION 0.25 MG TABS 1 by mouth at bedtime as needed for sleep   Last Refilled: 12/03/2010 [facility], Please Advise refills   Follow-up for Phone Call        ok to refill x 5 Follow-up by: Jacques Navy MD,  January 08, 2011 1:34 PM    Prescriptions: HALCION 0.25 MG TABS (TRIAZOLAM) 1 by mouth at bedtime as needed for sleep  #30 x 5   Entered by:   Ami Bullins CMA   Authorized by:   Jacques Navy MD   Signed by:   Bill Salinas CMA on 01/09/2011   Method used:   Telephoned to ...       [facility] [address] [phone]* (retail)       [address]       [place], [place]  [address]       Ph: [phone] or [phone]       Fax: [phone]   RxID:   [id number]

## 2011-01-20 LAB — GLUCOSE, CAPILLARY: Glucose-Capillary: 113 mg/dL — ABNORMAL HIGH (ref 70–99)

## 2011-01-24 LAB — COMPREHENSIVE METABOLIC PANEL
ALT: 142 U/L — ABNORMAL HIGH (ref 0–53)
AST: 126 U/L — ABNORMAL HIGH (ref 0–37)
Albumin: 3.8 g/dL (ref 3.5–5.2)
Alkaline Phosphatase: 59 U/L (ref 39–117)
BUN: 21 mg/dL (ref 6–23)
CO2: 28 mEq/L (ref 19–32)
Calcium: 9.6 mg/dL (ref 8.4–10.5)
Chloride: 97 mEq/L (ref 96–112)
Creatinine, Ser: 1.11 mg/dL (ref 0.4–1.5)
GFR calc Af Amer: >60 mL/min (ref >60)
GFR calc non Af Amer: >60 mL/min (ref >60)
Glucose, Bld: 159 mg/dL — ABNORMAL HIGH (ref 70–99)
Potassium: 4.3 mEq/L (ref 3.5–5.1)
Sodium: 138 mEq/L (ref 135–145)
Total Bilirubin: 1.1 mg/dL (ref 0.3–1.2)
Total Protein: 8 g/dL (ref 6.0–8.3)

## 2011-01-24 LAB — CBC
HCT: 39 % (ref 39.0–52.0)
HCT: 44.7 % (ref 39.0–52.0)
Hemoglobin: 12.9 g/dL — ABNORMAL LOW (ref 13.0–17.0)
Hemoglobin: 14.9 g/dL (ref 13.0–17.0)
MCH: 28.3 pg (ref 26.0–34.0)
MCH: 28.4 pg (ref 26.0–34.0)
MCHC: 33.1 g/dL (ref 30.0–36.0)
MCHC: 33.3 g/dL (ref 30.0–36.0)
MCV: 85.4 fL (ref 78.0–100.0)
MCV: 85.5 fL (ref 78.0–100.0)
Platelets: 166 10*3/uL (ref 150–400)
Platelets: 214 10*3/uL (ref 150–400)
RBC: 4.56 MIL/uL (ref 4.22–5.81)
RBC: 5.23 MIL/uL (ref 4.22–5.81)
RDW: 13.8 % (ref 11.5–15.5)
RDW: 13.9 % (ref 11.5–15.5)
WBC: 10.2 10*3/uL (ref 4.0–10.5)
WBC: 10.2 10*3/uL (ref 4.0–10.5)

## 2011-01-24 LAB — DIFFERENTIAL
Basophils Absolute: 0 10*3/uL (ref 0.0–0.1)
Basophils Absolute: 0 10*3/uL (ref 0.0–0.1)
Basophils Relative: 0 % (ref 0–1)
Basophils Relative: 0 % (ref 0–1)
Eosinophils Absolute: 0.1 10*3/uL (ref 0.0–0.7)
Eosinophils Absolute: 0.1 10*3/uL (ref 0.0–0.7)
Eosinophils Relative: 1 % (ref 0–5)
Eosinophils Relative: 1 % (ref 0–5)
Lymphocytes Relative: 28 % (ref 12–46)
Lymphocytes Relative: 29 % (ref 12–46)
Lymphs Abs: 2.9 10*3/uL (ref 0.7–4.0)
Lymphs Abs: 3 10*3/uL (ref 0.7–4.0)
Monocytes Absolute: 1.1 10*3/uL — ABNORMAL HIGH (ref 0.1–1.0)
Monocytes Absolute: 1.1 10*3/uL — ABNORMAL HIGH (ref 0.1–1.0)
Monocytes Relative: 11 % (ref 3–12)
Monocytes Relative: 11 % (ref 3–12)
Neutro Abs: 5.9 10*3/uL (ref 1.7–7.7)
Neutro Abs: 6.1 10*3/uL (ref 1.7–7.7)
Neutrophils Relative %: 58 % (ref 43–77)
Neutrophils Relative %: 59 % (ref 43–77)

## 2011-01-24 LAB — GLUCOSE, CAPILLARY
Glucose-Capillary: 101 mg/dL — ABNORMAL HIGH (ref 70–99)
Glucose-Capillary: 120 mg/dL — ABNORMAL HIGH (ref 70–99)
Glucose-Capillary: 121 mg/dL — ABNORMAL HIGH (ref 70–99)
Glucose-Capillary: 125 mg/dL — ABNORMAL HIGH (ref 70–99)
Glucose-Capillary: 187 mg/dL — ABNORMAL HIGH (ref 70–99)
Glucose-Capillary: 194 mg/dL — ABNORMAL HIGH (ref 70–99)

## 2011-01-24 LAB — SURGICAL PCR SCREEN
MRSA, PCR: NEGATIVE
Staphylococcus aureus: NEGATIVE

## 2011-02-03 ENCOUNTER — Telehealth: Payer: Self-pay | Admitting: *Deleted

## 2011-02-03 MED ORDER — GLIPIZIDE ER 2.5 MG PO TB24: 2.5000 mg | ORAL_TABLET | Freq: Every day | ORAL | Status: DC

## 2011-02-03 NOTE — Telephone Encounter (Signed)
Refill req from pharm

## 2011-02-13 ENCOUNTER — Telehealth: Payer: Self-pay | Admitting: *Deleted

## 2011-02-13 MED ORDER — INSULIN GLARGINE 100 UNIT/ML ~~LOC~~ SOLN: 100.0000 [IU] | Freq: Every day | SUBCUTANEOUS | Status: DC

## 2011-02-13 NOTE — Telephone Encounter (Signed)
refill 

## 2011-03-25 NOTE — Assessment & Plan Note (Signed)
[facility]                             PULMONARY OFFICE NOTE   NAME:[name] [name], TAIVON HAROON                      MRN:          [id number]  DATE:03/22/2007                            DOB:          09-25-1956    PROBLEM LIST:  1. Obstructive sleep apnea.  2. Rhinitis.  3. Exogenous obesity.  4. Diabetes.  5. Hypertension.   HISTORY:  He returns after a sleep study done on January 21, 2007,  confirmed severe obstructive apnea with an index of 91.8 per hour, loud  snoring, and desaturation to 77%.  CPAP was titrated to 20 CWP for an  index of 2.4.  These results were reviewed with him in detail today.  He  did not like his initial exposure to CPAP at the [facility] and wants  to try surgery first.  His son had surgery with Dr. Serena Colonel, which  was successful.  Mr. Kriz gives a history of multiple nasal fractures  in the past.  We have discussed the physiology, medical concerns, and  available treatments for sleep apnea.  Emphasizing his responsibility to  keep his weight down and to drive safely.   MEDICATIONS:  1. Protonix 40 mg.  2. Enalapril 5 mg.  3. Metformin 500 mg.  4. Amlodipine 10 mg.  5. HCTZ 25 mg.  6. Niaspan 500 mg.  7. Insulin 20 units.  8. Fish oil.  9. Aspirin 325 mg.   DRUG INTOLERANT:  PENICILLIN.   PHYSICAL EXAMINATION:  VITAL SIGNS:  Weight 328 pounds, BP 114/80, pulse  92, room air saturation 90%.  GENERAL:  He is quite obese.  HEENT:  Palate is facing 3-4/4.  Voice quality normal.  He tends to  mouth breath.  LUNGS:  Clear.  HEART:  Sounds normal.   IMPRESSION:  1. Obstructive sleep apnea, severe.  2. History of nasal fractures.   PLAN:  1. ENT referral to Dr. Serena Colonel.  2. Weight loss.  3. Schedule return here p.r.n.     Clinton D. Maple Hudson, MD, [facility], FACP     CDY/MedQ  DD: 03/25/2007  DT: 03/25/2007  Job #: [id number]   cc:   Leanne Chang, M.D.  Jefry H. Pollyann Kennedy, MD

## 2011-03-28 NOTE — Op Note (Signed)
[facility]  Patient:    [name] [name], [name] [name]                      MRN: [id number] Proc. Date: 04/06/01 Adm. Date:  [id number] Attending:  Ronne Binning                           Operative Report  PREOPERATIVE DIAGNOSIS:  Recurrent dorsal cyst, right wrist.  POSTOPERATIVE DIAGNOSIS:  Recurrent dorsal cyst, right wrist.  OPERATION:  Excision of cyst, reconstruction of dorsal capsule, right wrist.  SURGEON:  Nicki Reaper, M.D.  ASSISTANT:  Joaquin Courts, R.N.  ANESTHESIA:  Axillary block.  ANESTHESIOLOGIST:  Janetta Hora. Gelene Mink, M.D.  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  DESCRIPTION OF PROCEDURE:  The patient was brought to the operating room where an axillary block was carried out without difficulty.  He was prepped and draped using Betadine scrubbing solution with the right arm free.  The limb was exsanguinated with an Esmarch bandage.  Tourniquet placed high on the arm was inflated to 250 mmHg.  The transverse incision was made, carried down through subcutaneous tissue, bleeders were electrocauterized and neurovascular structures protected.  Dissection carried down to a large multilobulated cyst. With blunt and sharp dissection, this was dissected free.  This was found to arise from the dorsal portion of the distal radioulnar joint.  This area was opened, minimally debrided.  The cyst was sent to pathology.  The area was irrigated.  The dorsal extensor retinaculum was harvested in the distal component.  This was then inserted against the opening in the capsule and sutured into position with interrupted 4-0 Vicryl suture.  The wound was again irrigated.  The subcutaneous tissue was closed with interrupted 4-0 Vicryl and the skin with a subcuticular 4-0 Monocryl suture.  Steri-Strips were applied, a sterile compressive dressing and splint applied.  Patient  tolerated the procedure well and was taken to the recovery room for observation in satisfactory condition.  He is discharged home to return to the [facility] in one week on Vicodin and Septra DS. DD:  04/06/01 TD:  04/06/01 Job: [id number]

## 2011-03-28 NOTE — Assessment & Plan Note (Signed)
[facility]                         GASTROENTEROLOGY OFFICE NOTE   NAME:[name] [name], [name] [name]                      MRN:          [id number]  DATE:10/19/2006                            DOB:          10-May-1956    REFERRING PHYSICIAN:  Leanne Chang, M.D.   REASON FOR REFERRAL:  Dr. Blossom Hoops asked me to evaluate Mr. Tomaro in  consultation regarding abnormal liver tests and colorectal cancer  screening.   HISTORY OF PRESENT ILLNESS:  [name] [name] is a very pleasant 55 year old  man who has gained approximately 100 pounds in the last year and a half  since stopping smoking. He was recently found on routine physical to  have abnormal liver tests. Specifically, his AST was 57 and his ALT was  81. He is also found to have a hemoglobin A1c of 8.9. He was arranged to  have an abdominal ultrasound. This showed diffusely echogenic liver  consistent with fatty liver disease. Otherwise, his biliary tree was  normal.   [name] has never had liver problems before. Never had hepatitis.  Never became jaundiced. He has had no tattoos in the distant past. Never  been a significant alcohol drinker, but he does drink wine maybe once a  week. He has had no constipation or diarrhea. No GI bleeding. He has  never had colorectal cancer screening.   REVIEW OF SYSTEMS:  Notable for 100-pound weight gain the past year and  a half but otherwise essentially normal as available on his nursing  intake sheet.   PAST MEDICAL HISTORY:  1. Hypertension.  2. Diabetes, recently diagnosed.  3. Obesity over the past year or two.   CURRENT MEDICATIONS:  1. Protonix 40 mg once daily.  2. Enalapril.  3. Metformin.  4. Amlodipine.  5. Hydrochlorothiazide.  6. Niaspan.   ALLERGIES:  PENICILLIN.   SOCIAL HISTORY:  Married with two children. 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 Nonsmoker. Drinks alcohol on weekends.   FAMILY HISTORY:  No colon cancer, polyps or liver  disease in family.   PHYSICAL EXAMINATION:  Six feet 3 inches, 334 pounds. Blood pressure  152/82, pulse 74.  CONSTITUTIONAL:  Obese, otherwise well appearing.  NEUROLOGICAL:  Alert and oriented x3.  EYES:  Extraocular movements intact. MOUTH:  Oropharynx moist, no  lesions.  NECK:  Supple. No lymphadenopathy.  CARDIOVASCULAR:  Heart regular rate and rhythm.  LUNGS:  Clear to auscultation bilaterally.  ABDOMEN:  Soft, nontender, nondistended. Normal bowel sounds.  EXTREMITIES:  No lower extremity.  SKIN:  No rashes or lesions on the extremities.   ASSESSMENT AND PLAN:  A 55 year old man with mildly elevated  transaminases, routine risk for colorectal cancer. First, we will  arrange for him to have screening colonoscopy at his soonest  convenience. He wants to wait until after the holidays, and that seems  very reasonable.   Second, he is gained over 100 pounds in the past year and a half, has  echo patterns consistent with fatty liver on his recent ultrasound and  liver tests in the range that is very common for fatty liver  disease. I  suspect that he does indeed simply have fatty liver disease. I will,  however, arrange for him to be tested for hepatitis B and C, autoimmune  liver disease, iron overload. If these tests are all negative, then I  think just simply rechecking his liver tests in five to six months' time  after a concerted effort to loose weight is very reasonable.     Rachael Fee, MD  Electronically Signed    DPJ/MedQ  DD: 10/19/2006  DT: 10/20/2006  Job #: [id number]   cc:   Leanne Chang, M.D.

## 2011-03-28 NOTE — Assessment & Plan Note (Signed)
[facility]                             PULMONARY OFFICE NOTE   NAME:[name], [name] BELCHER                      MRN:          [id number]  DATE:01/07/2007                            DOB:          Apr 18, 1956    PROBLEM:  A 55 year old man referred through the courtesy of Dr.  Blossom Hoops in sleep medicine consultation, concerned about sleep disorder  breathing.   HISTORY:  His wife has been complaining for long time about very loud  snoring and tells him that he wakes gasping.  He is aware of fragmented  sleep but usually can not tell if his sleep is disturbed by his  breathing specifically.  In the morning, he feels unrested, and during  the day he will drift off easily if he is quiet.  He does not notice  problems driving or when occupied.  He uses no sleep medications.  He  has been aware that he moves his feet quite a lot at night.  Naps are  helpful.  Usual bedtime between 11:00 p.m. and 12:00 p.m., estimating  sleep latency 15 to 20 minutes, waking 5 times during the night mostly  for nocturia before finally up at 8:00 a.m.   MEDICATIONS:  1. Protonix 40 mg.  2. Enalapril 5 mg.  3. Metformin 500 mg.  4. Amlodipine 10 mg.  5. HCTZ 25 mg.  6. Niaspan 500 mg.  7. Insulin/Lantus 20 units.  8. Fish oil.  9. Aspirin 325 mg.  10.Drug-intolerant penicillin.   REVIEW OF SYSTEMS:  As per HPI.  He is aware of seasonal nasal  congestion.  Weight has gone up considerably in the last 4 years, since  he quit smoking.  No syncope, confusion, chest pain, or palpitation.   PAST MEDICAL HISTORY:  Hypertension, diabetes, elevated cholesterol, no  history of thyroid trouble.  Had nasal fracture x4 playing sports with  septoplasty.  He has also had tonsillectomy and adenoids out.  No  history of neurologic or cardiopulmonary problems.   SOCIAL HISTORY:  Quit smoking 6 years ago.  Alcohol twice a week.  Occasional [facility], otherwise no caffeine.  He is  married with  children and owns a trucking company.   FAMILY HISTORY:  Son has sleep apnea, getting CPAP.  Father had heart  disease.  Father and mother had cancer.   OBJECTIVE:  VITAL SIGNS:  Weight 325 pounds, BP 126/84, pulse regular  83, room air saturation 95%.  GENERAL:  This is an obese, rather loud-spoken, alert and pleasant  gentleman.  HEENT:  Nasal airway is unobstructed, palate spacing 3-4/4 with thick  neck.  No stridor, no thyromegaly.  HEART:  Sounds are regular without murmur or gallop.  LUNGS:  Clear.  Breathing is unlabored.  EXTREMITIES:  There is no peripheral edema or cyanosis, no tremor.  NEUROLOGIC:  Unremarkable to observation.   IMPRESSION:  1. Obstructive sleep apnea.  2. Rhinitis.  3. Exogenous obesity.  4. Diabetes.  5. Hypertension.   PLAN:  1. We have discussed the medical concerns, physiology and basic      management of  obstructive sleep apnea with an emphasis on his      responsibility to lose weight and to drive safely.  2. We are scheduling a split protocol nocturnal polysomnogram at the      [facility], and he will return after that      for followup.  I appreciate the chance to see him.     Clinton D. Maple Hudson, MD, [facility], FACP  Electronically Signed    CDY/MedQ  DD: 01/09/2007  DT: 01/09/2007  Job #: [id number]   cc:   Leanne Chang, M.D.  [facility]

## 2011-03-28 NOTE — Procedures (Signed)
NAMEHOLT, WOOLBRIGHT               ACCOUNT NO.:  [id number]   MEDICAL RECORD NO.:  [id number]          PATIENT TYPE:  OUT   LOCATION:  SLEEP CENTER                 FACILITY[facility]  [facility]   PHYSICIAN:  Clinton D. Maple Hudson, MD, FCCP, FACPDATE OF BIRTH:  January 03, 1956   DATE OF STUDY:  01/21/2007                            NOCTURNAL POLYSOMNOGRAM   REFERRING PHYSICIAN:  Clinton D. Young   INDICATION FOR STUDY:  Insomnia with sleep apnea.   Epworth Sleepiness Score 12/24.  BMI   Dictation ended at this point.      Clinton D. Maple Hudson, MD, [facility], FACP  Diplomate, American Board of Sleep Medicine     CDY/MEDQ  D:  01/24/2007 15:41:01  T:  01/25/2007 09:25:41  Job:  [id number]

## 2011-03-28 NOTE — Procedures (Signed)
NAMEAIMAR, [name] [name]               ACCOUNT NO.:  [id number]   MEDICAL RECORD NO.:  [id number]          PATIENT TYPE:  OUT   LOCATION:  SLEEP CENTER                 FACILITY[facility]  [facility]   PHYSICIAN:  Clinton D. Maple Hudson, MD, FCCP, FACPDATE OF BIRTH:  10-18-1956   DATE OF STUDY:                            NOCTURNAL POLYSOMNOGRAM   INDICATION FOR STUDY:  Insomnia with sleep apnea.   EPWORTH SLEEPINESS SCORE:  12/24.   MEDICATIONS:  Home medications are listed and reviewed.   SLEEP ARCHITECTURE:  Total sleep time 268 minutes with sleep efficiency  63%.  Stage I was 9%, stage II 58%, stages III and IV 6%, REM 26% of  total sleep time.  Sleep latency 33 minutes, REM latency 15 minutes,  awake after sleep onset 127 minutes, arousal index 17.5.  No bedtime  medication was taken.  The patient's sleep onset was at 10:46 p.m.  Patient was awake from approximately 12:45 until 2:15 a.m.   RESPIRATORY DATA:  Split study protocol.  Apnea-hypopnea index (AHI,  RDI) 91.5 obstructive events per hour, indicating severe obstructive  sleep apnea/hypopnea syndrome before CPAP.  There were 112 obstructive  apneas and 74 hypopneas before CPAP.  Events were not positional.  REM  AHI 25.4.  CPAP was titrated to 20 CWP, AHI 2.4 per hour.  A large  Comfort Full Respironics mask was used with a heated humidifier.   OXYGEN DATA:  Moderate to loud snoring with oxygen desaturation to a  nadir of 77% before CPAP.  After CPAP control, saturation held 92-93% on  room air.   CARDIAC DATA:  Sinus rhythm with occasional PVC.   MOVEMENT-PARASOMNIA:  Occasional limb jerk, insignificant.   IMPRESSIONS-RECOMMENDATIONS:  1. Severe obstructive sleep apnea/hypopnea syndrome, apnea-hypopnea      index 91.5 per hour with nonpositional events, moderate to loud      snoring and oxygen desaturation to a nadir of 77%.  2. Continuous positive airway pressure titration to 20 CWP, apnea-      hypopnea index 2.4 per hour.  A large  Comfort Full Respironics mask      was used with heated humidifier.     Clinton D. Maple Hudson, MD, FCCP, FACP  Diplomate, Biomedical engineer of Sleep Medicine  Electronically Signed    CDY/MEDQ  D:  01/24/2007 15:46:01  T:  01/25/2007 09:50:08  Job:  [id number]

## 2011-04-10 ENCOUNTER — Other Ambulatory Visit: Payer: Self-pay | Admitting: Internal Medicine

## 2011-04-14 ENCOUNTER — Encounter (INDEPENDENT_AMBULATORY_CARE_PROVIDER_SITE_OTHER): Payer: Self-pay | Admitting: Surgery

## 2011-05-05 ENCOUNTER — Telehealth: Payer: Self-pay | Admitting: *Deleted

## 2011-05-05 DIAGNOSIS — T887XXA Unspecified adverse effect of drug or medicament, initial encounter: Secondary | ICD-10-CM

## 2011-05-05 DIAGNOSIS — E119 Type 2 diabetes mellitus without complications: Secondary | ICD-10-CM

## 2011-05-05 DIAGNOSIS — I10 Essential (primary) hypertension: Secondary | ICD-10-CM

## 2011-05-05 DIAGNOSIS — E785 Hyperlipidemia, unspecified: Secondary | ICD-10-CM

## 2011-05-05 NOTE — Telephone Encounter (Signed)
Patient requesting "full set" of labs to have this week. If ok, what would you like to order?

## 2011-05-05 NOTE — Telephone Encounter (Signed)
A1C 250.00, lipid 274.2, metabolic 401.9, LFT 995.20

## 2011-05-05 NOTE — Telephone Encounter (Signed)
Order placed in Epic//lmovm advising pt labs approve and ok to stop by and collect.

## 2011-05-07 ENCOUNTER — Other Ambulatory Visit (INDEPENDENT_AMBULATORY_CARE_PROVIDER_SITE_OTHER): Payer: Self-pay

## 2011-05-07 ENCOUNTER — Other Ambulatory Visit: Payer: Self-pay | Admitting: Internal Medicine

## 2011-05-07 DIAGNOSIS — E785 Hyperlipidemia, unspecified: Secondary | ICD-10-CM

## 2011-05-07 DIAGNOSIS — E119 Type 2 diabetes mellitus without complications: Secondary | ICD-10-CM

## 2011-05-07 DIAGNOSIS — I10 Essential (primary) hypertension: Secondary | ICD-10-CM

## 2011-05-07 DIAGNOSIS — T887XXA Unspecified adverse effect of drug or medicament, initial encounter: Secondary | ICD-10-CM

## 2011-05-07 LAB — LIPID PANEL
Cholesterol: 164 mg/dL (ref 0–200)
HDL: 57.6 mg/dL (ref >39.00)
Total CHOL/HDL Ratio: 3
Triglycerides: 312 mg/dL — ABNORMAL HIGH (ref 0.0–149.0)
VLDL: 62.4 mg/dL — ABNORMAL HIGH (ref 0.0–40.0)

## 2011-05-07 LAB — BASIC METABOLIC PANEL
BUN: 22 mg/dL (ref 6–23)
CO2: 30 mEq/L (ref 19–32)
Calcium: 9 mg/dL (ref 8.4–10.5)
Chloride: 103 mEq/L (ref 96–112)
Creatinine, Ser: 0.7 mg/dL (ref 0.4–1.5)
GFR: 128.27 mL/min (ref >60.00)
Glucose, Bld: 90 mg/dL (ref 70–99)
Potassium: 4.1 mEq/L (ref 3.5–5.1)
Sodium: 141 mEq/L (ref 135–145)

## 2011-05-07 LAB — HEPATIC FUNCTION PANEL
ALT: 30 U/L (ref 0–53)
AST: 33 U/L (ref 0–37)
Albumin: 3.9 g/dL (ref 3.5–5.2)
Alkaline Phosphatase: 80 U/L (ref 39–117)
Bilirubin, Direct: 0.1 mg/dL (ref 0.0–0.3)
Total Bilirubin: 0.4 mg/dL (ref 0.3–1.2)
Total Protein: 6.8 g/dL (ref 6.0–8.3)

## 2011-05-07 LAB — HEMOGLOBIN A1C: Hgb A1c MFr Bld: 5.4 % (ref 4.6–6.5)

## 2011-05-07 LAB — LDL CHOLESTEROL, DIRECT: Direct LDL: 66.5 mg/dL

## 2011-05-09 ENCOUNTER — Encounter (INDEPENDENT_AMBULATORY_CARE_PROVIDER_SITE_OTHER): Payer: Self-pay

## 2011-06-09 ENCOUNTER — Encounter: Payer: Self-pay | Admitting: Internal Medicine

## 2011-06-09 ENCOUNTER — Ambulatory Visit (INDEPENDENT_AMBULATORY_CARE_PROVIDER_SITE_OTHER): Payer: BC Managed Care – PPO | Admitting: Internal Medicine

## 2011-06-09 DIAGNOSIS — I10 Essential (primary) hypertension: Secondary | ICD-10-CM

## 2011-06-09 DIAGNOSIS — E119 Type 2 diabetes mellitus without complications: Secondary | ICD-10-CM

## 2011-06-10 MED ORDER — ENALAPRIL MALEATE 20 MG PO TABS: 10.0000 mg | ORAL_TABLET | Freq: Every day | ORAL | Status: DC

## 2011-06-10 NOTE — Progress Notes (Signed)
  Subjective:    Patient ID: [name] [name], male    DOB: 20-Sep-1956, 55 y.o.   MRN: [id number]  HPI [name] [name] is seen acutely for his report of several episodes of becoming flushed, diaphoretic and then having altered mental status and clonic tonic movement without loss of consciousness. Of note he is s/p lap band procedure and has lost 86+ lbs. He has also improved his diet. Currently he is feeling well.  PMH, FamHx and SocHx reviewed for any changes and relevance.    Review of Systems Review of Systems  Constitutional:  Negative for fever, chills, activity change and unexpected weight change.  HEENT:  Negative for hearing loss, ear pain, congestion, neck stiffness and postnasal drip. Negative for sore throat or swallowing problems. Negative for dental complaints.   Eyes: Negative for vision loss or change in visual acuity.  Respiratory: Negative for chest tightness and wheezing.   Cardiovascular: Negative for chest pain and palpitation. No decreased exercise tolerance Gastrointestinal: No change in bowel habit. No bloating or gas. No reflux or indigestion Genitourinary: Negative for urgency, frequency, flank pain and difficulty urinating.  Musculoskeletal: Negative for myalgias, back pain, arthralgias and gait problem.  Neurological: Negative for dizziness, tremors, weakness and headaches.  Hematological: Negative for adenopathy.  Psychiatric/Behavioral: Negative for behavioral problems and dysphoric mood.       Objective:   Physical Exam Vitals noted - excellent BP, weight is down Gen'l - WNWD still overweight white man in no distress HEENT - C&S clear Cor - RRR Neuro - A&O x 3, normal gait and station, normal strength.        Assessment & Plan:

## 2011-06-10 NOTE — Assessment & Plan Note (Signed)
Patient with symptoms c/w hypoglycemia. He has lost a lot of weight but had continued on his pre-weight loss regimen of basal insulin, metformin and glipizide.  Plan - stop basal insulin, stop glipizide, continue metformin           Monitor CBGs

## 2011-06-10 NOTE — Assessment & Plan Note (Signed)
Patient with consistently low BP. This may be a loss of weight effect.  Plan - reduce enalapril to 10mg  daily           Monitor BP

## 2011-06-12 ENCOUNTER — Encounter (INDEPENDENT_AMBULATORY_CARE_PROVIDER_SITE_OTHER): Payer: Self-pay

## 2011-06-13 ENCOUNTER — Other Ambulatory Visit: Payer: Self-pay | Admitting: Internal Medicine

## 2011-06-13 ENCOUNTER — Ambulatory Visit (INDEPENDENT_AMBULATORY_CARE_PROVIDER_SITE_OTHER): Payer: BC Managed Care – PPO | Admitting: Physician Assistant

## 2011-06-13 ENCOUNTER — Encounter (INDEPENDENT_AMBULATORY_CARE_PROVIDER_SITE_OTHER): Payer: Self-pay

## 2011-06-13 NOTE — Patient Instructions (Signed)
Return as scheduled. Return sooner if you are having difficulty swallowing, increasing appetite or increased portion sizes.

## 2011-06-13 NOTE — Progress Notes (Signed)
  HISTORY: [name] [name] is a 55 y.o.male who received an AP-Large lap-band in August 2011 by Dr. Ezzard Standing. He is at his one-year appointment and is continuing to do very well with 16 lbs of weight loss since his last appointment. He denies persistent regurgitation or vomiting and says his portion sizes remain small. He doesn't believe an adjustment is needed today.  He has no incisional hernia noted on exam. He is not taking blood thinners for suspected/confirmed DVT or PE. He is taking one oral agent for Diabetes, one for hyperlipidemia and one for hypertension. He is on protonix for GERD. He is not treating OSA or musculoskeletal disease. He was neither re-admitted to the hospital nor has he had any further operative procedures since his lap band placement.  VITAL SIGNS: Filed Vitals:   06/13/11 0914  BP: 118/80    PHYSICAL EXAM: Physical exam reveals a very well-appearing 55 y.o.male in no apparent distress Neurologic: Awake, alert, oriented Psych: Bright affect, conversant Respiratory: Breathing even and unlabored. No stridor or wheezing Extremities: Atraumatic, good range of motion. Skin: Warm, Dry, no rashes Musculoskeletal: Normal gait, Joints normal  ASSESMENT: 55 y.o.  male  s/p AP-Large lap-band. He is doing very well.  PLAN: We opted to not adjust the band today as he is continuing to lose weight at the current fill level. He'll return in 2-3 months or sooner if necessary.

## 2011-07-18 ENCOUNTER — Telehealth: Payer: Self-pay | Admitting: *Deleted

## 2011-07-18 MED ORDER — TRIAZOLAM 0.25 MG PO TABS: 0.2500 mg | ORAL_TABLET | Freq: Every evening | ORAL | Status: DC | PRN

## 2011-07-18 NOTE — Telephone Encounter (Signed)
Pt wanting refill on halcion 0.25 SIG one tab qhs. Please Advise refill. [facility] [address]

## 2011-07-21 ENCOUNTER — Other Ambulatory Visit: Payer: Self-pay | Admitting: *Deleted

## 2011-07-21 MED ORDER — NIACIN ER (ANTIHYPERLIPIDEMIC) 1000 MG PO TBCR: 2000.0000 mg | EXTENDED_RELEASE_TABLET | Freq: Every day | ORAL | Status: DC

## 2011-08-15 ENCOUNTER — Other Ambulatory Visit: Payer: Self-pay | Admitting: Internal Medicine

## 2011-09-03 ENCOUNTER — Other Ambulatory Visit: Payer: Self-pay | Admitting: Internal Medicine

## 2011-09-06 ENCOUNTER — Other Ambulatory Visit: Payer: Self-pay | Admitting: Internal Medicine

## 2011-09-08 NOTE — Telephone Encounter (Signed)
Done

## 2011-09-25 ENCOUNTER — Emergency Department (HOSPITAL_COMMUNITY): Payer: BC Managed Care – PPO

## 2011-09-25 ENCOUNTER — Other Ambulatory Visit: Payer: Self-pay

## 2011-09-25 ENCOUNTER — Emergency Department (HOSPITAL_COMMUNITY)
Admission: EM | Admit: 2011-09-25 | Discharge: 2011-09-26 | Disposition: A | Discharge status: Other Acute Inpatient Hospital | Payer: BC Managed Care – PPO | Source: Home / Self Care | Attending: Emergency Medicine | Admitting: Emergency Medicine

## 2011-09-25 ENCOUNTER — Encounter (HOSPITAL_COMMUNITY): Payer: Self-pay

## 2011-09-25 DIAGNOSIS — R000 Tachycardia, unspecified: Secondary | ICD-10-CM | POA: Insufficient documentation

## 2011-09-25 DIAGNOSIS — S2249XA Multiple fractures of ribs, unspecified side, initial encounter for closed fracture: Secondary | ICD-10-CM | POA: Insufficient documentation

## 2011-09-25 DIAGNOSIS — W108XXA Fall (on) (from) other stairs and steps, initial encounter: Secondary | ICD-10-CM | POA: Insufficient documentation

## 2011-09-25 DIAGNOSIS — N289 Disorder of kidney and ureter, unspecified: Secondary | ICD-10-CM | POA: Insufficient documentation

## 2011-09-25 DIAGNOSIS — R0602 Shortness of breath: Secondary | ICD-10-CM | POA: Insufficient documentation

## 2011-09-25 DIAGNOSIS — R079 Chest pain, unspecified: Secondary | ICD-10-CM | POA: Insufficient documentation

## 2011-09-25 DIAGNOSIS — E119 Type 2 diabetes mellitus without complications: Secondary | ICD-10-CM | POA: Insufficient documentation

## 2011-09-25 DIAGNOSIS — M6282 Rhabdomyolysis: Secondary | ICD-10-CM

## 2011-09-25 DIAGNOSIS — I10 Essential (primary) hypertension: Secondary | ICD-10-CM | POA: Insufficient documentation

## 2011-09-25 LAB — CBC
HCT: 40.8 % (ref 39.0–52.0)
Hemoglobin: 14.4 g/dL (ref 13.0–17.0)
MCH: 31.4 pg (ref 26.0–34.0)
MCHC: 35.3 g/dL (ref 30.0–36.0)
MCV: 88.9 fL (ref 78.0–100.0)
Platelets: 144 10*3/uL — ABNORMAL LOW (ref 150–400)
RBC: 4.59 MIL/uL (ref 4.22–5.81)
RDW: 13.9 % (ref 11.5–15.5)
WBC: 9.9 10*3/uL (ref 4.0–10.5)

## 2011-09-25 LAB — COMPREHENSIVE METABOLIC PANEL
ALT: 34 U/L (ref 0–53)
AST: 59 U/L — ABNORMAL HIGH (ref 0–37)
Albumin: 4.1 g/dL (ref 3.5–5.2)
Alkaline Phosphatase: 75 U/L (ref 39–117)
BUN: 47 mg/dL — ABNORMAL HIGH (ref 6–23)
CO2: 24 mEq/L (ref 19–32)
Calcium: 9.6 mg/dL (ref 8.4–10.5)
Chloride: 96 mEq/L (ref 96–112)
Creatinine, Ser: 2.03 mg/dL — ABNORMAL HIGH (ref 0.50–1.35)
GFR calc Af Amer: 40 mL/min — ABNORMAL LOW (ref >90)
GFR calc non Af Amer: 35 mL/min — ABNORMAL LOW (ref >90)
Glucose, Bld: 176 mg/dL — ABNORMAL HIGH (ref 70–99)
Potassium: 4.1 mEq/L (ref 3.5–5.1)
Sodium: 133 mEq/L — ABNORMAL LOW (ref 135–145)
Total Bilirubin: 1.1 mg/dL (ref 0.3–1.2)
Total Protein: 7.7 g/dL (ref 6.0–8.3)

## 2011-09-25 LAB — CARDIAC PANEL(CRET KIN+CKTOT+MB+TROPI)
CK, MB: 13.6 ng/mL (ref 0.3–4.0)
Relative Index: 1.3 (ref 0.0–2.5)
Total CK: 1088 U/L — ABNORMAL HIGH (ref 7–232)
Troponin I: <0.3 ng/mL (ref <0.30)

## 2011-09-25 MED ORDER — HYDROMORPHONE HCL PF 1 MG/ML IJ SOLN
1.0000 mg | Freq: Once | INTRAMUSCULAR | Status: AC
  Administered 2011-09-25: 1 mg via INTRAVENOUS
  Filled 2011-09-25: qty 1

## 2011-09-25 MED ORDER — ONDANSETRON HCL 4 MG/2ML IJ SOLN
4.0000 mg | Freq: Once | INTRAMUSCULAR | Status: AC
  Administered 2011-09-25: 4 mg via INTRAVENOUS
  Filled 2011-09-25: qty 2

## 2011-09-25 MED ORDER — SODIUM CHLORIDE 0.9 % IV BOLUS (SEPSIS)
1000.0000 mL | Freq: Once | INTRAVENOUS | Status: AC
  Administered 2011-09-25: 1000 mL via INTRAVENOUS

## 2011-09-25 MED ORDER — SODIUM CHLORIDE 0.9 % IV SOLN
999.0000 mL | Freq: Once | INTRAVENOUS | Status: AC
  Administered 2011-09-25: 1000 mL via INTRAVENOUS

## 2011-09-25 NOTE — ED Notes (Signed)
Pt at CT

## 2011-09-25 NOTE — ED Provider Notes (Signed)
History     CSN: [id number] Arrival date & time: 09/25/2011  6:48 PM   First MD Initiated Contact with Patient 09/25/11 1903      Chief Complaint  Patient presents with  . Shortness of Breath  . Chest Pain    (Consider location/radiation/quality/duration/timing/severity/associated sxs/prior treatment) HPI The patient presents one day after a mechanical fall down approximately 16 stairs with worsening chest pain, and back pain. He notes that prior to the fall he was in his usual state of health. Since the fall, there is no loss of consciousness, any disorientation, any vomiting, but he continues to have worsening pain. He notes the pain is in his left anterior superior chest, as well as diffusely across his back. The pain is described as sharp, incapacitating. It is worse with motion, not better with any OTC medications. He also complains of the pain being worse with deep inspiration, and coughing, though he denies true exertional pain. No abdominal pain, no change in bowel movements, or urinary patterns. Other swelling, no extremity numbness, weakness, tingling. No fevers, no chills Past Medical History  Diagnosis Date  . Hypertension   . Diabetes mellitus     type 2  . GERD (gastroesophageal reflux disease)   . Hyperlipidemia   . 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 Arthroscopy x 1 left (rendall)     X 2 right  . Ganglion cyst excision     right wrist (kuzma)  . Correction of deviated septum   . Tonsillectomy   . Pilonidal cyst / sinus excision 55 yrs old  . Gastric restriction surgery     lap band  . Abdominal surgery     Family History  Problem Relation Age of Onset  . Cancer Father     metatstatic cancer to brain  . Coronary artery disease Father   . Heart attack Father     X 2 (41,45)  . Cancer Mother     rapid on set  . Diabetes Neg Hx     History  Substance Use Topics  . Smoking status: Former  Smoker    Quit date: 06/13/1995  . Smokeless tobacco: Not on file  . Alcohol Use: 1.8 oz/week    3 Glasses of wine per week      Review of Systems Gen: Per HPI HEENT: No HA CV: hpi Resp: No dyspnea at rest w minimal chest wall motion Abd: Per HPI, otherwise negative Musk: Per HPI, otherwise negative Neuro: No dysesthesia, or focal changes GU: Per HPI, otherwise negative Skin: Neg Psych: Neg Allergies  Penicillins and Codeine  Home Medications   Current Outpatient Rx  Name Route Sig Dispense Refill  . AMLODIPINE BESYLATE 10 MG PO TABS  TAKE 1 TABLET EVERY DAY 90 tablet 2  . ASPIRIN 81 MG PO TABS Oral Take 81 mg by mouth daily.      Marland Kitchen CIALIS 20 MG PO TABS  TAKE 1 BY MOUTH AS NEEDED 6 tablet 5  . ENALAPRIL MALEATE 20 MG PO TABS Oral Take 0.5 tablets (10 mg total) by mouth daily.    Marland Kitchen GABAPENTIN 300 MG PO CAPS Oral Take 300 mg by mouth daily.      Marland Kitchen HYDROCHLOROTHIAZIDE 25 MG PO TABS  TAKE 1 TABLET EVERY DAY 90 tablet 0  . IBUPROFEN 200 MG PO TABS Oral Take 200 mg by mouth 3 (three) times daily.      Marland Kitchen  METFORMIN HCL 1000 MG PO TABS Oral Take 1,000 mg by mouth 2 (two) times daily with a meal.      . ONE-A-DAY EXTRAS ANTIOXIDANT PO CAPS Oral Take 1 capsule by mouth daily.      Marland Kitchen NIACIN (ANTIHYPERLIPIDEMIC) 1000 MG PO TBCR Oral Take 2 tablets (2,000 mg total) by mouth daily. 180 tablet 1  . FISH OIL 1200 MG PO CAPS Oral Take 5 capsules by mouth daily.     Marland Kitchen PANTOPRAZOLE SODIUM 40 MG PO TBEC  TAKE 1 TABLET EVERY DAY 90 tablet 2  . TRIAZOLAM 0.25 MG PO TABS Oral Take 0.25 mg by mouth at bedtime as needed. Sleep       BP 117/78  Pulse 101  Temp(Src) 98.5 F (36.9 C) (Oral)  Resp 13  SpO2 93%  Physical Exam  Constitutional: He is oriented to person, place, and time. He appears well-developed and well-nourished. No distress.  HENT:  Head: Normocephalic and atraumatic.  Neck: Trachea normal, normal range of motion and full passive range of motion without pain. Neck supple. No  hepatojugular reflux and no JVD present. No spinous process tenderness present. No edema and normal range of motion present. No mass present.  Cardiovascular: Regular rhythm.  Tachycardia present.   Pulmonary/Chest:       There is diffuse tenderness to palpation about the left side of the chest, as well as the left posterior lateral  Abdominal: Soft. He exhibits no distension.  Musculoskeletal: Normal range of motion. He exhibits tenderness. He exhibits no edema.  Lymphadenopathy:    He has no cervical adenopathy.  Neurological: He is alert and oriented to person, place, and time. No cranial nerve deficit. He exhibits normal muscle tone. Coordination normal.  Skin: Skin is warm and dry. No rash noted. No erythema.  Psychiatric: He has a normal mood and affect.    ED Course  Procedures (including critical care time)   Labs Reviewed  CBC  COMPREHENSIVE METABOLIC PANEL  CARDIAC PANEL(CRET KIN+CKTOT+MB+TROPI)   Dg Chest Portable 1 View  09/25/2011  *RADIOLOGY REPORT*  Clinical Data: 55 year old male with chest pain.  PORTABLE CHEST - 1 VIEW  Comparison: 06/11/2010.  Findings: Portable AP view 2015 hours.  Slightly lower lung volumes.  Cardiac size at the upper limits of normal.  Stable mediastinal contours, otherwise within normal limits. Visualized tracheal air column is within normal limits.  No pneumothorax, pulmonary edema, pleural effusion or confluent pulmonary opacity.  IMPRESSION: No acute cardiopulmonary abnormality.  Original Report Authenticated By: Harley Hallmark, M.D.     No diagnosis found.  Rule out contusion versus fracture. Patient will have CT following initial x-ray which will be done to rule out pneumothorax. CT will be done due to increased sensitivity to intrathoracic trauma  MDM  This 55 year old male presents one day following a mechanical fall down a number of stairs. On exam the patient is uncomfortable appearing, though with vital signs notable only for  tachycardia. The patient has pain with all range of motion evaluation of the left shoulder, as well as with minimal palpation about the left anterior superior chest. The patient's labs are notable for a CK greater than 1000, and renal dysfunction. Renal function is new compared to the patient's baseline appropriate kidney function. The patient's CT notable for minimally displaced fractures of the third, fourth, fifth ribs on the left. The shoulder x-ray of the patient does not demonstrate acute pathology. Following initial evaluation, the patient received IV fluids, analgesics, and noted that  he felt significantly better. Given the findings of traumatic rib fracture, the case was discussed with Dr. Ezzard Standing here at [facility]. The case has been discussed with Dr. Dwain Sarna at [facility]. The patient will be transferred to the emergency department, whereupon Dr. Dwain Sarna will be called to evaluate the patient.        Gerhard Munch, MD 09/25/11 [phone]

## 2011-09-26 ENCOUNTER — Encounter (HOSPITAL_COMMUNITY): Payer: Self-pay | Admitting: *Deleted

## 2011-09-26 ENCOUNTER — Observation Stay (HOSPITAL_COMMUNITY)
Admission: EM | Admit: 2011-09-26 | Discharge: 2011-09-27 | Disposition: A | Discharge status: Home / Self Care | Payer: BC Managed Care – PPO | Attending: General Surgery | Admitting: General Surgery

## 2011-09-26 DIAGNOSIS — E119 Type 2 diabetes mellitus without complications: Secondary | ICD-10-CM | POA: Insufficient documentation

## 2011-09-26 DIAGNOSIS — W108XXA Fall (on) (from) other stairs and steps, initial encounter: Secondary | ICD-10-CM | POA: Insufficient documentation

## 2011-09-26 DIAGNOSIS — S2242XA Multiple fractures of ribs, left side, initial encounter for closed fracture: Secondary | ICD-10-CM | POA: Diagnosis present

## 2011-09-26 DIAGNOSIS — M19049 Primary osteoarthritis, unspecified hand: Secondary | ICD-10-CM | POA: Insufficient documentation

## 2011-09-26 DIAGNOSIS — Y92009 Unspecified place in unspecified non-institutional (private) residence as the place of occurrence of the external cause: Secondary | ICD-10-CM | POA: Insufficient documentation

## 2011-09-26 DIAGNOSIS — Z01810 Encounter for preprocedural cardiovascular examination: Secondary | ICD-10-CM | POA: Insufficient documentation

## 2011-09-26 DIAGNOSIS — I10 Essential (primary) hypertension: Secondary | ICD-10-CM | POA: Insufficient documentation

## 2011-09-26 DIAGNOSIS — K219 Gastro-esophageal reflux disease without esophagitis: Secondary | ICD-10-CM | POA: Insufficient documentation

## 2011-09-26 DIAGNOSIS — E785 Hyperlipidemia, unspecified: Secondary | ICD-10-CM | POA: Insufficient documentation

## 2011-09-26 DIAGNOSIS — M25559 Pain in unspecified hip: Secondary | ICD-10-CM | POA: Insufficient documentation

## 2011-09-26 DIAGNOSIS — Z01818 Encounter for other preprocedural examination: Secondary | ICD-10-CM | POA: Insufficient documentation

## 2011-09-26 DIAGNOSIS — Z01812 Encounter for preprocedural laboratory examination: Secondary | ICD-10-CM | POA: Insufficient documentation

## 2011-09-26 DIAGNOSIS — S2249XA Multiple fractures of ribs, unspecified side, initial encounter for closed fracture: Principal | ICD-10-CM | POA: Insufficient documentation

## 2011-09-26 DIAGNOSIS — Y998 Other external cause status: Secondary | ICD-10-CM | POA: Insufficient documentation

## 2011-09-26 LAB — CBC
HCT: 37.5 % — ABNORMAL LOW (ref 39.0–52.0)
Hemoglobin: 12.7 g/dL — ABNORMAL LOW (ref 13.0–17.0)
MCH: 31.1 pg (ref 26.0–34.0)
MCHC: 33.9 g/dL (ref 30.0–36.0)
MCV: 91.9 fL (ref 78.0–100.0)
Platelets: 110 10*3/uL — ABNORMAL LOW (ref 150–400)
RBC: 4.08 MIL/uL — ABNORMAL LOW (ref 4.22–5.81)
RDW: 14 % (ref 11.5–15.5)
WBC: 7.9 10*3/uL (ref 4.0–10.5)

## 2011-09-26 LAB — GLUCOSE, CAPILLARY
Glucose-Capillary: 161 mg/dL — ABNORMAL HIGH (ref 70–99)
Glucose-Capillary: 117 mg/dL — ABNORMAL HIGH (ref 70–99)

## 2011-09-26 LAB — BASIC METABOLIC PANEL
BUN: 28 mg/dL — ABNORMAL HIGH (ref 6–23)
CO2: 27 mEq/L (ref 19–32)
Calcium: 8.8 mg/dL (ref 8.4–10.5)
Chloride: 98 mEq/L (ref 96–112)
Creatinine, Ser: 1.04 mg/dL (ref 0.50–1.35)
GFR calc Af Amer: >90 mL/min (ref >90)
GFR calc non Af Amer: 78 mL/min — ABNORMAL LOW (ref >90)
Glucose, Bld: 162 mg/dL — ABNORMAL HIGH (ref 70–99)
Potassium: 3.6 mEq/L (ref 3.5–5.1)
Sodium: 135 mEq/L (ref 135–145)

## 2011-09-26 MED ORDER — BISACODYL 10 MG RE SUPP: 10.0000 mg | Freq: Every day | RECTAL | Status: DC | PRN

## 2011-09-26 MED ORDER — ENALAPRIL MALEATE 10 MG PO TABS
10.0000 mg | ORAL_TABLET | Freq: Every day | ORAL | Status: DC
  Administered 2011-09-26 – 2011-09-27 (×2): 10 mg via ORAL
  Filled 2011-09-26 (×2): qty 1

## 2011-09-26 MED ORDER — SODIUM CHLORIDE 0.9 % IV BOLUS (SEPSIS)
500.0000 mL | Freq: Once | INTRAVENOUS | Status: AC
  Administered 2011-09-26: 500 mL via INTRAVENOUS

## 2011-09-26 MED ORDER — HYDROCHLOROTHIAZIDE 25 MG PO TABS
25.0000 mg | ORAL_TABLET | Freq: Every day | ORAL | Status: DC
  Administered 2011-09-26 – 2011-09-27 (×2): 25 mg via ORAL
  Filled 2011-09-26 (×2): qty 1

## 2011-09-26 MED ORDER — THERA M PLUS PO TABS
1.0000 | ORAL_TABLET | Freq: Every day | ORAL | Status: DC
  Administered 2011-09-27: 1 via ORAL
  Filled 2011-09-26 (×2): qty 1

## 2011-09-26 MED ORDER — OXYCODONE HCL 5 MG PO TABS
5.0000 mg | ORAL_TABLET | ORAL | Status: DC | PRN
  Administered 2011-09-26 (×2): 15 mg via ORAL
  Administered 2011-09-27 (×2): 10 mg via ORAL
  Filled 2011-09-26: qty 2
  Filled 2011-09-26 (×3): qty 3
  Filled 2011-09-26: qty 2

## 2011-09-26 MED ORDER — TRIAZOLAM 0.25 MG PO TABS
0.2500 mg | ORAL_TABLET | Freq: Every evening | ORAL | Status: DC | PRN
  Administered 2011-09-26: 0.25 mg via ORAL
  Filled 2011-09-26: qty 1

## 2011-09-26 MED ORDER — ASPIRIN EC 81 MG PO TBEC
81.0000 mg | DELAYED_RELEASE_TABLET | Freq: Every day | ORAL | Status: DC
  Administered 2011-09-26 – 2011-09-27 (×2): 81 mg via ORAL
  Filled 2011-09-26 (×2): qty 1

## 2011-09-26 MED ORDER — ONDANSETRON HCL 4 MG PO TABS
4.0000 mg | ORAL_TABLET | Freq: Four times a day (QID) | ORAL | Status: DC | PRN
  Administered 2011-09-26: 4 mg via ORAL

## 2011-09-26 MED ORDER — OMEGA-3-ACID ETHYL ESTERS 1 G PO CAPS
6.0000 g | ORAL_CAPSULE | Freq: Every day | ORAL | Status: DC
  Administered 2011-09-27: 6 g via ORAL
  Filled 2011-09-26 (×2): qty 6

## 2011-09-26 MED ORDER — ONDANSETRON HCL 4 MG/2ML IJ SOLN
4.0000 mg | Freq: Four times a day (QID) | INTRAMUSCULAR | Status: DC | PRN
  Filled 2011-09-26: qty 2

## 2011-09-26 MED ORDER — IBUPROFEN 200 MG PO TABS
200.0000 mg | ORAL_TABLET | Freq: Three times a day (TID) | ORAL | Status: DC
  Administered 2011-09-26 – 2011-09-27 (×3): 200 mg via ORAL
  Filled 2011-09-26 (×6): qty 1

## 2011-09-26 MED ORDER — OXYCODONE HCL 5 MG PO TABS
10.0000 mg | ORAL_TABLET | ORAL | Status: DC | PRN
  Filled 2011-09-26: qty 2

## 2011-09-26 MED ORDER — DOCUSATE SODIUM 100 MG PO CAPS
100.0000 mg | ORAL_CAPSULE | Freq: Two times a day (BID) | ORAL | Status: DC
  Administered 2011-09-26 – 2011-09-27 (×3): 100 mg via ORAL
  Filled 2011-09-26 (×3): qty 1

## 2011-09-26 MED ORDER — AMLODIPINE BESYLATE 5 MG PO TABS
5.0000 mg | ORAL_TABLET | Freq: Every day | ORAL | Status: DC
  Administered 2011-09-26 – 2011-09-27 (×2): 5 mg via ORAL
  Filled 2011-09-26 (×3): qty 1

## 2011-09-26 MED ORDER — PANTOPRAZOLE SODIUM 40 MG PO TBEC
40.0000 mg | DELAYED_RELEASE_TABLET | Freq: Every day | ORAL | Status: DC
  Administered 2011-09-26 – 2011-09-27 (×2): 40 mg via ORAL
  Filled 2011-09-26 (×2): qty 1

## 2011-09-26 MED ORDER — ENOXAPARIN SODIUM 40 MG/0.4ML ~~LOC~~ SOLN
40.0000 mg | Freq: Two times a day (BID) | SUBCUTANEOUS | Status: DC
  Administered 2011-09-26 – 2011-09-27 (×3): 40 mg via SUBCUTANEOUS
  Filled 2011-09-26 (×4): qty 0.4

## 2011-09-26 MED ORDER — OXYCODONE HCL 5 MG PO TABS: 5.0000 mg | ORAL_TABLET | ORAL | Status: DC | PRN

## 2011-09-26 MED ORDER — MORPHINE SULFATE 4 MG/ML IJ SOLN
2.0000 mg | INTRAMUSCULAR | Status: DC | PRN
  Filled 2011-09-26: qty 1

## 2011-09-26 MED ORDER — PANTOPRAZOLE SODIUM 40 MG IV SOLR
40.0000 mg | Freq: Every day | INTRAVENOUS | Status: DC
  Filled 2011-09-26 (×2): qty 40

## 2011-09-26 MED ORDER — MORPHINE SULFATE 2 MG/ML IJ SOLN: 2.0000 mg | INTRAMUSCULAR | Status: DC | PRN

## 2011-09-26 MED ORDER — NIACIN ER (ANTIHYPERLIPIDEMIC) 500 MG PO TBCR
2000.0000 mg | EXTENDED_RELEASE_TABLET | Freq: Every day | ORAL | Status: DC
  Administered 2011-09-26: 2000 mg via ORAL
  Filled 2011-09-26 (×2): qty 4

## 2011-09-26 MED ORDER — GABAPENTIN 300 MG PO CAPS
300.0000 mg | ORAL_CAPSULE | Freq: Every day | ORAL | Status: DC
  Administered 2011-09-26 – 2011-09-27 (×2): 300 mg via ORAL
  Filled 2011-09-26 (×3): qty 1

## 2011-09-26 MED ORDER — SODIUM CHLORIDE 0.9 % IV SOLN: INTRAVENOUS | Status: DC

## 2011-09-26 MED ORDER — MORPHINE SULFATE 2 MG/ML IJ SOLN
2.0000 mg | INTRAMUSCULAR | Status: DC | PRN
  Administered 2011-09-26 (×2): 2 mg via INTRAVENOUS
  Filled 2011-09-26 (×2): qty 1

## 2011-09-26 MED ORDER — INSULIN ASPART 100 UNIT/ML ~~LOC~~ SOLN
0.0000 [IU] | Freq: Three times a day (TID) | SUBCUTANEOUS | Status: DC
  Administered 2011-09-26 – 2011-09-27 (×2): 3 [IU] via SUBCUTANEOUS
  Filled 2011-09-26: qty 3

## 2011-09-26 NOTE — H&P (Signed)
[name] [name] is an 55 y.o. male.   Chief Complaint: s/p fall HPI: this is a 55 year old male with a history of diabetes and hypertension who presents after a fall down to flights of stairs on Wednesday night. He was at home all day on Thursday and had increasing left-sided chest pain. This was aggravated by deep breathing. There was no relief that he was having at home. He also complains of some right hip pain. He was evaluated at [facility] for the fall and found to have left rib fractures and was transferred to [facility].  Past Medical History  Diagnosis Date  . Hypertension   . Diabetes mellitus     type 2  . GERD (gastroesophageal reflux disease)   . Hyperlipidemia   . 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 Cancer   . Hyperlipidemia     Past Surgical History  Procedure Date  . Arthroscopy x 1 left (rendall)     X 2 right  . Ganglion cyst excision     right wrist (kuzma)  . Correction of deviated septum   . Tonsillectomy   . Pilonidal cyst / sinus excision 55 yrs old  . Gastric restriction surgery     lap band  . Abdominal surgery     Family History  Problem Relation Age of Onset  . Cancer Father     metatstatic cancer to brain  . Coronary artery disease Father   . Heart attack Father     X 2 (41,45)  . Cancer Mother     rapid on set  . Diabetes Neg Hx    Social History:  reports that he quit smoking about 16 years ago. He does not have any smokeless tobacco history on file. He reports that he drinks about 1.8 ounces of alcohol per week. He reports that he does not use illicit drugs.  Allergies:  Allergies  Allergen Reactions  . Penicillins Anaphylaxis  . Codeine Nausea And Vomiting    Medications Prior to Admission  Medication Dose Route Frequency Provider Last Rate Last Dose  . 0.9 %  sodium chloride infusion  999 mL Intravenous Once Gerhard Munch, MD   1,000 mL at 09/25/11 2027  . HYDROmorphone (DILAUDID) injection 1 mg  1 mg Intravenous Once  Gerhard Munch, MD   1 mg at 09/25/11 2030  . HYDROmorphone (DILAUDID) injection 1 mg  1 mg Intravenous Once Gerhard Munch, MD   1 mg at 09/25/11 2116  . ondansetron (ZOFRAN) injection 4 mg  4 mg Intravenous Once Gerhard Munch, MD   4 mg at 09/25/11 2031  . sodium chloride 0.9 % bolus 1,000 mL  1,000 mL Intravenous Once Gerhard Munch, MD   1,000 mL at 09/25/11 2238  . sodium chloride 0.9 % bolus 500 mL  500 mL Intravenous Once Emelia Loron, MD       Medications Prior to Admission  Medication Sig Dispense Refill  . amLODipine (NORVASC) 10 MG tablet TAKE 1 TABLET EVERY DAY  90 tablet  2  . aspirin 81 MG tablet Take 81 mg by mouth daily.        . enalapril (VASOTEC) 20 MG tablet Take 0.5 tablets (10 mg total) by mouth daily.      Marland Kitchen gabapentin (NEURONTIN) 300 MG capsule Take 300 mg by mouth daily.        . hydrochlorothiazide (HYDRODIURIL) 25 MG tablet TAKE 1 TABLET EVERY DAY  90 tablet  0  . ibuprofen (  ADVIL,MOTRIN) 200 MG tablet Take 200 mg by mouth 3 (three) times daily.        . metFORMIN (GLUCOPHAGE) 1000 MG tablet Take 1,000 mg by mouth 2 (two) times daily with a meal.        . Multiple Vitamins-Minerals (ONE-A-DAY EXTRAS ANTIOXIDANT) CAPS Take 1 capsule by mouth daily.        . niacin (NIASPAN) 1000 MG CR tablet Take 2 tablets (2,000 mg total) by mouth daily.  180 tablet  1  . Omega-3 Fatty Acids (FISH OIL) 1200 MG CAPS Take 5 capsules by mouth daily.       . pantoprazole (PROTONIX) 40 MG tablet TAKE 1 TABLET EVERY DAY  90 tablet  2  . triazolam (HALCION) 0.25 MG tablet Take 0.25 mg by mouth at bedtime as needed. Sleep       . CIALIS 20 MG tablet TAKE 1 BY MOUTH AS NEEDED  6 tablet  5    Results for orders placed during the hospital encounter of 09/25/11 (from the past 48 hour(s))  CBC     Status: Abnormal   Collection Time   09/25/11  8:30 PM      Component Value Range Comment   WBC 9.9  4.0 - 10.5 (K/uL)    RBC 4.59  4.22 - 5.81 (MIL/uL)    Hemoglobin 14.4  13.0 - 17.0  (g/dL)    HCT 16.1  09.6 - 04.5 (%)    MCV 88.9  78.0 - 100.0 (fL)    MCH 31.4  26.0 - 34.0 (pg)    MCHC 35.3  30.0 - 36.0 (g/dL)    RDW 40.9  81.1 - 91.4 (%)    Platelets 144 (*) 150 - 400 (K/uL)   COMPREHENSIVE METABOLIC PANEL     Status: Abnormal   Collection Time   09/25/11  8:30 PM      Component Value Range Comment   Sodium 133 (*) 135 - 145 (mEq/L)    Potassium 4.1  3.5 - 5.1 (mEq/L)    Chloride 96  96 - 112 (mEq/L)    CO2 24  19 - 32 (mEq/L)    Glucose, Bld 176 (*) 70 - 99 (mg/dL)    BUN 47 (*) 6 - 23 (mg/dL)    Creatinine, Ser 7.82 (*) 0.50 - 1.35 (mg/dL)    Calcium 9.6  8.4 - 10.5 (mg/dL)    Total Protein 7.7  6.0 - 8.3 (g/dL)    Albumin 4.1  3.5 - 5.2 (g/dL)    AST 59 (*) 0 - 37 (U/L)    ALT 34  0 - 53 (U/L)    Alkaline Phosphatase 75  39 - 117 (U/L)    Total Bilirubin 1.1  0.3 - 1.2 (mg/dL)    GFR calc non Af Amer 35 (*) >90 (mL/min)    GFR calc Af Amer 40 (*) >90 (mL/min)   CARDIAC PANEL(CRET KIN+CKTOT+MB+TROPI)     Status: Abnormal   Collection Time   09/25/11  8:30 PM      Component Value Range Comment   Total CK 1088 (*) 7 - 232 (U/L)    CK, MB 13.6 (*) 0.3 - 4.0 (ng/mL)    Troponin I <0.30  <0.30 (ng/mL)    Relative Index 1.3  0.0 - 2.5     Ct Chest Wo Contrast  09/25/2011  *RADIOLOGY REPORT*  Clinical Data: 55 year old male status post fall, trauma.  Severe left-sided pain.  CT CHEST WITHOUT CONTRAST  Technique:  Multidetector  CT imaging of the chest was performed following the standard protocol without IV contrast.  Comparison: Portable chest radiograph 09/25/2011.  Findings: No pneumothorax or pleural effusion.  There are acute nondisplaced fractures of the left lateral third, fourth, and fifth ribs.  There are superimposed chronic-appearing fractures of the left lateral seventh, eighth, and ninth ribs.  Thoracic vertebrae appear intact and normally aligned.  Sternum intact.  Negative noncontrast thoracic inlet.  No mediastinal hematoma. Coronary artery  calcified atherosclerosis.  No pericardial effusion.  Sequelae of gastric banding with rib pain food in the distal esophagus just proximal to the band.  Negative visualized noncontrast upper abdominal viscera.  IMPRESSION: 1.  Acute nondisplaced left lateral third, fourth, and fifth rib fractures.  No pneumothorax or pleural effusion. 2.  No other acute traumatic injury identified.  Chronic left seventh through ninth rib fractures. 3.  Sequelae of gastric banding.  Original Report Authenticated By: Harley Hallmark, M.D.   Dg Chest Portable 1 View  09/25/2011  *RADIOLOGY REPORT*  Clinical Data: 55 year old male with chest pain.  PORTABLE CHEST - 1 VIEW  Comparison: 06/11/2010.  Findings: Portable AP view 2015 hours.  Slightly lower lung volumes.  Cardiac size at the upper limits of normal.  Stable mediastinal contours, otherwise within normal limits. Visualized tracheal air column is within normal limits.  No pneumothorax, pulmonary edema, pleural effusion or confluent pulmonary opacity.  IMPRESSION: No acute cardiopulmonary abnormality.  Original Report Authenticated By: Harley Hallmark, M.D.   Dg Shoulder Left  09/25/2011  *RADIOLOGY REPORT*  Clinical Data: Status post fall; left shoulder pain.  LEFT SHOULDER - 2+ VIEW  Comparison: None.  Findings: There are mildly displaced fractures of the left third, fourth and fifth lateral ribs.  No additional fractures are seen. The left humeral head is seated within the glenoid fossa.  The acromioclavicular joint is unremarkable in appearance.  No significant soft tissue abnormalities are seen.  The visualized portions of the left lung are clear.  IMPRESSION:  1.  No evidence of fracture or dislocation at the left shoulder. 2.  Mildly displaced fractures involving the left third, fourth and fifth lateral ribs.  Original Report Authenticated By: Tonia Ghent, M.D.    Review of Systems  Constitutional: Negative for fever and chills.  HENT: Negative.   Respiratory:  Negative.   Cardiovascular: Negative.   Gastrointestinal: Negative.   Genitourinary: Negative.     Blood pressure 128/70, pulse 92, temperature 97 F (36.1 C), temperature source Oral, resp. rate 20, SpO2 95.00%. Physical Exam  Constitutional: He is oriented to person, place, and time. He appears well-developed and well-nourished.  Eyes: Conjunctivae and EOM are normal. No scleral icterus.  Neck: Neck supple.  Cardiovascular: Normal rate, regular rhythm and normal heart sounds.   Respiratory: Effort normal and breath sounds normal. He has no wheezes. He has no rales. He exhibits tenderness (left sided rib tenderness).  GI: Soft. Bowel sounds are normal. He exhibits no mass. There is no tenderness. There is no rebound and no guarding.    Musculoskeletal: Normal range of motion. He exhibits tenderness (mild right hip tenderness). He exhibits no edema.  Lymphadenopathy:    He has no cervical adenopathy.  Neurological: He is alert and oriented to person, place, and time.  Skin: Skin is warm and dry.     Assessment/Plan S/p fall Rib fractures Elevated CK Elevated Cr  Admission, iv hydration, check pelvis film, pain control, pulmonary toilet, recheck cr in am   [facility] 09/26/2011, 2:09  AM    

## 2011-09-26 NOTE — ED Notes (Signed)
Dr Dwain Sarna at pt bediside.  Pt tender with ANY movement and deep breathing.  AO x 4.  No crepitus noted.

## 2011-09-26 NOTE — Progress Notes (Signed)
Patient examined and I agree with the assessment and plan Improving with IV pain meds.  Check labs AM. [name] [name] E

## 2011-09-26 NOTE — ED Notes (Signed)
Flow manager notified that pt was not put in for a bed.   Await Dr Dwain Sarna.

## 2011-09-26 NOTE — Progress Notes (Signed)
LOS: 0 days   Subjective: Doing ok, but quite sore. Says hip doesn't hurt anymore and doesn't need x-rays of area. He's been up to bathroom and ambulated ok.  Objective: Vital signs in last 24 hours: Temp:  [97 F (36.1 C)-98.5 F (36.9 C)] 97.7 F (36.5 C) (11/16 0731) Pulse Rate:  [81-108] 105  (11/16 0731) Resp:  [10-24] 18  (11/16 0731) BP: (99-149)/(67-98) 147/98 mmHg (11/16 0731) SpO2:  [88 %-100 %] 94 % (11/16 0731)    Lab Results:  CBC  Basename 09/25/11 2030  WBC 9.9  HGB 14.4  HCT 40.8  PLT 144*   BMET  Basename 09/25/11 2030  NA 133*  K 4.1  CL 96  CO2 24  GLUCOSE 176*  BUN 47*  CREATININE 2.03*  CALCIUM 9.6    General appearance: alert and mild distress Resp: clear to auscultation bilaterally Cardio: regular rate and rhythm GI: normal findings: bowel sounds normal and soft, non-tender  Assessment/Plan: Fall Multiple left rib fxs -- Pain control and pulmonary toilet. IS not in room yet.  AKI -- Continue IVF. Recheck in am. Multiple medical problems -- Home meds FEN -- Regular diet VTE -- Lovenox Dispo -- Home once pain controlled.   Freeman Caldron, PA-C Pager: [phone] General Trauma PA Pager: [phone]   09/26/2011

## 2011-09-26 NOTE — Progress Notes (Signed)
Utilization review completed. [name] Heinemann Diane11/16/2012  

## 2011-09-26 NOTE — ED Notes (Signed)
Pt c/o pain 7/10 to L shoulder and upper ribs.

## 2011-09-26 NOTE — ED Notes (Signed)
55 yo male fell down 2 flights of steps yesterday.  No loc.  L rib pain.  Pt transferred from [facility] b/c he needs to see surgeon and he has elevated bun/creat and possible rabdomyolysis.  VS stable  109/77, 83 hr, 18 rr, spo2 of 99 on RA.

## 2011-09-26 NOTE — ED Notes (Signed)
Attempted to give report but bed has not been assigned yet.   Pt given Morphine 2mg  for pain.  Pt is upset that he has been in ED for so long.  Flow was paged.

## 2011-09-27 ENCOUNTER — Observation Stay (HOSPITAL_COMMUNITY): Payer: BC Managed Care – PPO

## 2011-09-27 LAB — GLUCOSE, CAPILLARY
Glucose-Capillary: 112 mg/dL — ABNORMAL HIGH (ref 70–99)
Glucose-Capillary: 163 mg/dL — ABNORMAL HIGH (ref 70–99)

## 2011-09-27 MED ORDER — OXYCODONE HCL 5 MG PO TABS: 5.0000 mg | ORAL_TABLET | ORAL | Status: AC | PRN

## 2011-09-27 MED ORDER — DSS 100 MG PO CAPS: 100.0000 mg | ORAL_CAPSULE | Freq: Two times a day (BID) | ORAL | Status: AC

## 2011-09-27 NOTE — Progress Notes (Signed)
This patient has been seen and I agree with the findings and treatment plan.  Keiko Myricks O. Delorice Bannister, III, MD, FACS [phone] (pager) [phone] (direct pager) Trauma Surgeon  

## 2011-09-27 NOTE — Discharge Summary (Signed)
Physician Discharge Summary  Patient ID: [name] MRN: [id number] DOB/AGE: 1956/02/19 55 y.o.  Admit date: 09/26/2011 Discharge date: 09/27/2011  Admission Diagnoses:  Discharge Diagnoses:  Principal Problem:  *Multiple fractures of ribs of left side Active Problems:  Fall down stairs   Discharged Condition: good  Hospital Course: [name] [name] is a 55 year old male who suffered a fall down some stairs and multiple left-sided rib fractures. This occurred a couple of days prior to his presentation due to persistent severe pain in the mobility secondary to the rib fractures. Once admitted he was started on intravenous opioids and transitioned over to oral medications and immobilized.  At this point he is able to ambulate without an assistive device and his pain is under control with oral opiates. He is medically stable and ready for discharge. His chest x-ray is showing only some minimal atelectasis at discharge.  Consults: none Treatments: analgesia: Oxycodone   Disposition: Home with family  Current Discharge Medication List    START taking these medications   Details  docusate sodium 100 MG CAPS Take 100 mg by mouth 2 (two) times daily. Qty: 10 capsule    oxyCODONE (OXY IR/ROXICODONE) 5 MG immediate release tablet Take 1-3 tablets (5-15 mg total) by mouth every 4 (four) hours as needed (5mg  for mild pain, 10mg  for moderate pain, 15mg  for severe pain). Qty: 80 tablet, Refills: 0      CONTINUE these medications which have NOT CHANGED   Details  amLODipine (NORVASC) 10 MG tablet TAKE 1 TABLET EVERY DAY Qty: 90 tablet, Refills: 2    aspirin 81 MG tablet Take 81 mg by mouth daily.      enalapril (VASOTEC) 20 MG tablet Take 0.5 tablets (10 mg total) by mouth daily.    gabapentin (NEURONTIN) 300 MG capsule Take 300 mg by mouth daily.      hydrochlorothiazide (HYDRODIURIL) 25 MG tablet TAKE 1 TABLET EVERY DAY Qty: 90 tablet, Refills: 0    ibuprofen  (ADVIL,MOTRIN) 200 MG tablet Take 200 mg by mouth 3 (three) times daily.      metFORMIN (GLUCOPHAGE) 1000 MG tablet Take 1,000 mg by mouth 2 (two) times daily with a meal.      Multiple Vitamins-Minerals (ONE-A-DAY EXTRAS ANTIOXIDANT) CAPS Take 1 capsule by mouth daily.      niacin (NIASPAN) 1000 MG CR tablet Take 2 tablets (2,000 mg total) by mouth daily. Qty: 180 tablet, Refills: 1    Omega-3 Fatty Acids (FISH OIL) 1200 MG CAPS Take 5 capsules by mouth daily.     pantoprazole (PROTONIX) 40 MG tablet TAKE 1 TABLET EVERY DAY Qty: 90 tablet, Refills: 2    triazolam (HALCION) 0.25 MG tablet Take 0.25 mg by mouth at bedtime as needed. Sleep     CIALIS 20 MG tablet TAKE 1 BY MOUTH AS NEEDED Qty: 6 tablet, Refills: 5       Follow-up Information    Follow up with [facility]. (Call for questions as needed if symptoms worsen)    Contact information:   [phone]         Signed: Camara Rosander 09/27/2011, 10:46 AM

## 2011-09-27 NOTE — Discharge Summary (Signed)
This patient has been seen and I agree with the findings and treatment plan.  Dalten Ambrosino O. Ransome Helwig, III, MD, FACS [phone] (pager) [phone] (direct pager) Trauma Surgeon  

## 2011-09-27 NOTE — Progress Notes (Signed)
   LOS: 1 day   Subjective:   Doing much better today and feels ready to go home now that he is moving around more. Objective: Vitand a and a and a was and in written and he had an and is very as and an and is limited and he is in a knee and the hand 900 and Lilliana is of the fourth and and al signs in last 24 hours: Temp:  [97.8 F (36.6 C)-97.9 F (36.6 C)] 97.9 F (36.6 C) (11/17 0503) Pulse Rate:  [79-83] 83  (11/17 0503) Resp:  [18] 18  (11/17 0503) BP: (126-144)/(81-95) 144/95 mmHg (11/17 0503) SpO2:  [92 %-93 %] 92 % (11/17 0503) Last BM Date: 09/26/11  Lab Results:  CBC  Basename 09/26/11 0939 09/25/11 2030  WBC 7.9 9.9  HGB 12.7* 14.4  HCT 37.5* 40.8  PLT 110* 144*   BMET  Basename 09/26/11 0939 09/25/11 2030  NA 135 133*  K 3.6 4.1  CL 98 96  CO2 27 24  GLUCOSE 162* 176*  BUN 28* 47*  CREATININE 1.04 2.03*  CALCIUM 8.8 9.6    General appearance: alert and NAD and Lungs : clear to auscultation bilaterally Cardio: regular rate and rhythm GI: normal findings: bowel sounds normal and soft, non-tender  Assessment/Plan: Fall Multiple left rib fxs -Pain under control with po meds AKI -- improved Multiple medical problems -- Home meds FEN -- Regular diet VTE -- Lovenox while hospitalized Dispo- DC home   Melessia Kaus,PA-C Pager [phone] General Trauma Pager [phone]   09/27/2011

## 2011-10-06 ENCOUNTER — Telehealth: Payer: Self-pay | Admitting: *Deleted

## 2011-10-06 ENCOUNTER — Telehealth: Payer: Self-pay | Admitting: Internal Medicine

## 2011-10-06 MED ORDER — OXYCODONE-ACETAMINOPHEN 5-325 MG PO TABS: 1.0000 | ORAL_TABLET | Freq: Four times a day (QID) | ORAL | Status: DC | PRN

## 2011-10-06 NOTE — Telephone Encounter (Signed)
Pt states he is taking 2 tablets every four hours to relief his pain

## 2011-10-06 NOTE — Telephone Encounter (Signed)
Pt was seen at the hospital with 3 broken ribs, he states he was prescribed oxycodone HCL 5 mg and has been taking them every 4 hours. Pt is out of medication and is calling to see if you can refill his medication. Please Advise

## 2011-10-06 NOTE — Telephone Encounter (Signed)
The pt called and stated he is hoping to get a refill of pain meds due to broken ribs?

## 2011-10-06 NOTE — Telephone Encounter (Signed)
Pt informed rx picked up today

## 2011-10-06 NOTE — Telephone Encounter (Signed)
Percocet 5/325 1 po q6 hrs #30 no refills.

## 2011-10-06 NOTE — Telephone Encounter (Signed)
Please try to get by on 5 mg - dose - may also take NSAIDs

## 2011-11-05 ENCOUNTER — Other Ambulatory Visit: Payer: Self-pay | Admitting: *Deleted

## 2011-11-05 MED ORDER — GABAPENTIN 300 MG PO CAPS: 300.0000 mg | ORAL_CAPSULE | Freq: Three times a day (TID) | ORAL | Status: DC

## 2011-11-07 ENCOUNTER — Telehealth: Payer: Self-pay | Admitting: *Deleted

## 2011-11-07 MED ORDER — TRIAZOLAM 0.25 MG PO TABS: 0.2500 mg | ORAL_TABLET | Freq: Every evening | ORAL | Status: DC | PRN

## 2011-11-07 NOTE — Telephone Encounter (Signed)
Refill request for Triazolam 0.25 mg sign pt take one tab qhs prn. Request from [facility] [address] [phone], Please Advise refills

## 2011-11-07 NOTE — Telephone Encounter (Signed)
Ok x 5 

## 2011-12-29 ENCOUNTER — Other Ambulatory Visit: Payer: Self-pay | Admitting: Internal Medicine

## 2012-01-06 ENCOUNTER — Other Ambulatory Visit: Payer: Self-pay | Admitting: Internal Medicine

## 2012-01-08 ENCOUNTER — Other Ambulatory Visit: Payer: Self-pay | Admitting: Internal Medicine

## 2012-01-08 NOTE — Telephone Encounter (Signed)
Done

## 2012-01-18 ENCOUNTER — Other Ambulatory Visit: Payer: Self-pay | Admitting: Internal Medicine

## 2012-03-09 ENCOUNTER — Telehealth: Payer: Self-pay | Admitting: Internal Medicine

## 2012-03-09 MED ORDER — GABAPENTIN 600 MG PO TABS: 1200.0000 mg | ORAL_TABLET | Freq: Three times a day (TID) | ORAL | Status: DC

## 2012-03-09 NOTE — Telephone Encounter (Signed)
Pt is on medicine for nerve condition.  He didn't know the name.  He takes 3 per day and wants to increase it to 6 per day.

## 2012-03-09 NOTE — Telephone Encounter (Signed)
Med rec indicates Gabapentin 300 mg tid. It is ok to increase to 600 mg ( 2 tabs) tid. Will need new Rx called in at increased dose.

## 2012-03-09 NOTE — Telephone Encounter (Signed)
Called pt no answer LMOM md response, also [facility]... 03/09/12@4 :04pm/LMB

## 2012-03-18 ENCOUNTER — Telehealth: Payer: Self-pay | Admitting: Internal Medicine

## 2012-03-18 DIAGNOSIS — E039 Hypothyroidism, unspecified: Secondary | ICD-10-CM

## 2012-03-18 DIAGNOSIS — E119 Type 2 diabetes mellitus without complications: Secondary | ICD-10-CM

## 2012-03-18 DIAGNOSIS — E785 Hyperlipidemia, unspecified: Secondary | ICD-10-CM

## 2012-03-18 DIAGNOSIS — M1990 Unspecified osteoarthritis, unspecified site: Secondary | ICD-10-CM

## 2012-03-18 DIAGNOSIS — I10 Essential (primary) hypertension: Secondary | ICD-10-CM

## 2012-03-18 NOTE — Telephone Encounter (Signed)
Patient is requesting to have his lab work drawn prior to his scheduled visit on Apr 07, 2012.Please advise.

## 2012-03-18 NOTE — Telephone Encounter (Signed)
Orders entered

## 2012-03-19 NOTE — Telephone Encounter (Signed)
Pt informed

## 2012-03-24 ENCOUNTER — Other Ambulatory Visit (INDEPENDENT_AMBULATORY_CARE_PROVIDER_SITE_OTHER): Payer: BC Managed Care – PPO

## 2012-03-24 DIAGNOSIS — M1990 Unspecified osteoarthritis, unspecified site: Secondary | ICD-10-CM

## 2012-03-24 DIAGNOSIS — E785 Hyperlipidemia, unspecified: Secondary | ICD-10-CM

## 2012-03-24 DIAGNOSIS — E039 Hypothyroidism, unspecified: Secondary | ICD-10-CM

## 2012-03-24 DIAGNOSIS — I10 Essential (primary) hypertension: Secondary | ICD-10-CM

## 2012-03-24 DIAGNOSIS — E119 Type 2 diabetes mellitus without complications: Secondary | ICD-10-CM

## 2012-03-24 LAB — HEPATIC FUNCTION PANEL
ALT: 17 U/L (ref 0–53)
AST: 27 U/L (ref 0–37)
Albumin: 3.8 g/dL (ref 3.5–5.2)
Alkaline Phosphatase: 79 U/L (ref 39–117)
Bilirubin, Direct: 0 mg/dL (ref 0.0–0.3)
Total Bilirubin: 0.6 mg/dL (ref 0.3–1.2)
Total Protein: 6.8 g/dL (ref 6.0–8.3)

## 2012-03-24 LAB — CBC WITH DIFFERENTIAL/PLATELET
Basophils Absolute: 0 10*3/uL (ref 0.0–0.1)
Basophils Relative: 0.4 % (ref 0.0–3.0)
Eosinophils Absolute: 0.1 10*3/uL (ref 0.0–0.7)
Eosinophils Relative: 1 % (ref 0.0–5.0)
HCT: 37 % — ABNORMAL LOW (ref 39.0–52.0)
Hemoglobin: 12.3 g/dL — ABNORMAL LOW (ref 13.0–17.0)
Lymphocytes Relative: 28.4 % (ref 12.0–46.0)
Lymphs Abs: 3.2 10*3/uL (ref 0.7–4.0)
MCHC: 33.2 g/dL (ref 30.0–36.0)
MCV: 93.8 fl (ref 78.0–100.0)
Monocytes Absolute: 1.1 10*3/uL — ABNORMAL HIGH (ref 0.1–1.0)
Monocytes Relative: 9.8 % (ref 3.0–12.0)
Neutro Abs: 6.9 10*3/uL (ref 1.4–7.7)
Neutrophils Relative %: 60.4 % (ref 43.0–77.0)
Platelets: 150 10*3/uL (ref 150.0–400.0)
RBC: 3.94 Mil/uL — ABNORMAL LOW (ref 4.22–5.81)
RDW: 13.8 % (ref 11.5–14.6)
WBC: 11.4 10*3/uL — ABNORMAL HIGH (ref 4.5–10.5)

## 2012-03-24 LAB — COMPREHENSIVE METABOLIC PANEL
ALT: 17 U/L (ref 0–53)
AST: 27 U/L (ref 0–37)
Albumin: 3.8 g/dL (ref 3.5–5.2)
Alkaline Phosphatase: 79 U/L (ref 39–117)
BUN: 20 mg/dL (ref 6–23)
CO2: 25 mEq/L (ref 19–32)
Calcium: 9 mg/dL (ref 8.4–10.5)
Chloride: 103 mEq/L (ref 96–112)
Creatinine, Ser: 0.9 mg/dL (ref 0.4–1.5)
GFR: 96.22 mL/min (ref >60.00)
Glucose, Bld: 102 mg/dL — ABNORMAL HIGH (ref 70–99)
Potassium: 4.2 mEq/L (ref 3.5–5.1)
Sodium: 137 mEq/L (ref 135–145)
Total Bilirubin: 0.6 mg/dL (ref 0.3–1.2)
Total Protein: 6.8 g/dL (ref 6.0–8.3)

## 2012-03-24 LAB — TSH: TSH: 2.72 u[IU]/mL (ref 0.35–5.50)

## 2012-03-24 LAB — LIPID PANEL
Cholesterol: 153 mg/dL (ref 0–200)
HDL: 58 mg/dL (ref >39.00)
Total CHOL/HDL Ratio: 3
Triglycerides: 279 mg/dL — ABNORMAL HIGH (ref 0.0–149.0)
VLDL: 55.8 mg/dL — ABNORMAL HIGH (ref 0.0–40.0)

## 2012-03-24 LAB — LDL CHOLESTEROL, DIRECT: Direct LDL: 70.6 mg/dL

## 2012-03-24 LAB — HEMOGLOBIN A1C: Hgb A1c MFr Bld: 5.4 % (ref 4.6–6.5)

## 2012-03-25 ENCOUNTER — Encounter: Payer: Self-pay | Admitting: Internal Medicine

## 2012-04-07 ENCOUNTER — Encounter: Payer: BC Managed Care – PPO | Admitting: Internal Medicine

## 2012-04-12 ENCOUNTER — Other Ambulatory Visit: Payer: Self-pay | Admitting: *Deleted

## 2012-04-12 NOTE — Telephone Encounter (Signed)
Received fax pt requesting refill on his glipizide. Med was d/c back on 06/09/11 fax back denied need f/u appt if want refill... 04/12/12@3 :04pm.LMB

## 2012-04-21 ENCOUNTER — Other Ambulatory Visit: Payer: Self-pay | Admitting: *Deleted

## 2012-04-21 MED ORDER — TRIAZOLAM 0.25 MG PO TABS: 0.2500 mg | ORAL_TABLET | Freq: Every evening | ORAL | Status: DC | PRN

## 2012-04-21 NOTE — Telephone Encounter (Signed)
Med refill [facility]. Left message on VM at patient home number

## 2012-04-21 NOTE — Telephone Encounter (Signed)
Patient request refill on medication Triazolam. LOV 06/09/2011    OK?

## 2012-04-21 NOTE — Telephone Encounter (Signed)
Ok for refill x 5 

## 2012-06-09 ENCOUNTER — Other Ambulatory Visit: Payer: Self-pay | Admitting: Internal Medicine

## 2012-06-30 ENCOUNTER — Telehealth: Payer: Self-pay | Admitting: Internal Medicine

## 2012-06-30 NOTE — Telephone Encounter (Signed)
I appreciate the wish for the old way but I am trying to be uniform: labs are ordered at the time of the visit.

## 2012-06-30 NOTE — Telephone Encounter (Signed)
Patient request to come in for lab work prior to appt.. States that you have let him do this in the past. Please advise. CB# [phone]

## 2012-07-01 NOTE — Telephone Encounter (Signed)
Left message on patient CB # that lab work would have to be ordered after the visit With Dr. Debby Bud due to policy change.

## 2012-07-07 ENCOUNTER — Encounter: Payer: Self-pay | Admitting: Internal Medicine

## 2012-07-07 ENCOUNTER — Other Ambulatory Visit (INDEPENDENT_AMBULATORY_CARE_PROVIDER_SITE_OTHER): Payer: BC Managed Care – PPO

## 2012-07-07 ENCOUNTER — Ambulatory Visit (INDEPENDENT_AMBULATORY_CARE_PROVIDER_SITE_OTHER): Payer: BC Managed Care – PPO | Admitting: Internal Medicine

## 2012-07-07 VITALS — BP 118/80 | HR 84 | Temp 97.4°F | Resp 16 | Wt 239.0 lb

## 2012-07-07 DIAGNOSIS — E039 Hypothyroidism, unspecified: Secondary | ICD-10-CM

## 2012-07-07 DIAGNOSIS — I10 Essential (primary) hypertension: Secondary | ICD-10-CM

## 2012-07-07 DIAGNOSIS — E785 Hyperlipidemia, unspecified: Secondary | ICD-10-CM

## 2012-07-07 DIAGNOSIS — E669 Obesity, unspecified: Secondary | ICD-10-CM

## 2012-07-07 DIAGNOSIS — E119 Type 2 diabetes mellitus without complications: Secondary | ICD-10-CM

## 2012-07-07 LAB — COMPREHENSIVE METABOLIC PANEL
ALT: 26 U/L (ref 0–53)
AST: 39 U/L — ABNORMAL HIGH (ref 0–37)
Albumin: 4 g/dL (ref 3.5–5.2)
Alkaline Phosphatase: 95 U/L (ref 39–117)
BUN: 16 mg/dL (ref 6–23)
CO2: 29 mEq/L (ref 19–32)
Calcium: 8.9 mg/dL (ref 8.4–10.5)
Chloride: 103 mEq/L (ref 96–112)
Creatinine, Ser: 0.7 mg/dL (ref 0.4–1.5)
GFR: 117.69 mL/min (ref >60.00)
Glucose, Bld: 99 mg/dL (ref 70–99)
Potassium: 4.9 mEq/L (ref 3.5–5.1)
Sodium: 139 mEq/L (ref 135–145)
Total Bilirubin: 0.8 mg/dL (ref 0.3–1.2)
Total Protein: 7.1 g/dL (ref 6.0–8.3)

## 2012-07-07 LAB — TSH: TSH: 2.38 u[IU]/mL (ref 0.35–5.50)

## 2012-07-07 LAB — HEPATIC FUNCTION PANEL
ALT: 26 U/L (ref 0–53)
AST: 39 U/L — ABNORMAL HIGH (ref 0–37)
Albumin: 4 g/dL (ref 3.5–5.2)
Alkaline Phosphatase: 95 U/L (ref 39–117)
Bilirubin, Direct: 0.1 mg/dL (ref 0.0–0.3)
Total Bilirubin: 0.8 mg/dL (ref 0.3–1.2)
Total Protein: 7.1 g/dL (ref 6.0–8.3)

## 2012-07-07 LAB — LIPID PANEL
Cholesterol: 167 mg/dL (ref 0–200)
HDL: 83.3 mg/dL (ref >39.00)
LDL Cholesterol: 65 mg/dL (ref 0–99)
Total CHOL/HDL Ratio: 2
Triglycerides: 95 mg/dL (ref 0.0–149.0)
VLDL: 19 mg/dL (ref 0.0–40.0)

## 2012-07-07 LAB — HEMOGLOBIN A1C: Hgb A1c MFr Bld: 5.3 % (ref 4.6–6.5)

## 2012-07-07 NOTE — Progress Notes (Signed)
Subjective:    Patient ID: [name] [name], male    DOB: December 31, 1955, 56 y.o.   MRN: [id number]  HPI [name] [name] presents for routine medical folllow-up. He has lost a total of 135 lbs after lap-band surgery and is feeling. In the interval he has had no medical illness, surgery or injury. He has a regular exercise program with walking, gym work and water-skiing. He has seen ophthal in the last year. He has been to dentist.   Past Medical History  Diagnosis Date  . Hypertension   . Diabetes mellitus     type 2  . GERD (gastroesophageal reflux disease)   . Hyperlipidemia   . 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 Hyperlipidemia    Past Surgical History  Procedure Date  . Arthroscopy x 1 left (rendall)     X 2 right  . Ganglion cyst excision     right wrist (kuzma)  . Correction of deviated septum   . Tonsillectomy   . Pilonidal cyst / sinus excision 56 yrs old  . Gastric banding port revision    Family History  Problem Relation Age of Onset  . Cancer Father     lung cancer metatstatic cancer to brain  . Coronary artery disease Father   . Heart attack Father     X 2 (41,45)  . Heart disease Father     CAD/MI x 2  . Cancer Mother     rapid on set  . Hypertension Mother   . Diabetes Neg Hx    History   Social History  . Marital Status: Married    Spouse Name: [name] [name]    Number of Children: 2  . Years of Education: 12   Occupational History  . Trucking     Social History Main Topics  . Smoking status: Former Smoker    Quit date: 06/13/1995  . Smokeless tobacco: Never Used  . Alcohol Use: 1.8 oz/week    3 Glasses of wine per week  . Drug Use: No  . Sexually Active: Yes -- Male partner(s)   Other Topics Concern  . Not on file   Social History Narrative   [name] [name], [name] [name] - BA business. Married '76  2 sons - '77, '80. Marriage in good health. 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    Current Outpatient Prescriptions on File  Prior to Visit  Medication Sig Dispense Refill  . aspirin 81 MG tablet Take 81 mg by mouth daily.        Marland Kitchen CIALIS 20 MG tablet TAKE 1 BY MOUTH AS NEEDED  6 tablet  5  . enalapril ([name] [name]) 20 MG tablet Take 0.5 tablets (10 mg total) by mouth daily.  90 tablet  1  . gabapentin ([name] [name]) 600 MG tablet Take 2 tablets (1,200 mg total) by mouth 3 (three) times daily.  180 tablet  3  . ibuprofen (ADVIL,MOTRIN) 200 MG tablet Take 200 mg by mouth 3 (three) times daily.        . metFORMIN (GLUCOPHAGE) 1000 MG tablet TAKE 1 TABLET BY MOUTH 2 TIMES A DAY  60 tablet  0  . Multiple Vitamins-Minerals (ONE-A-DAY EXTRAS ANTIOXIDANT) CAPS Take 1 capsule by mouth daily.        Marland Kitchen NIASPAN 1000 MG CR tablet TAKE 2 TABLETS EVERY DAY  180 tablet  1  . pantoprazole (PROTONIX) 40 MG tablet TAKE 1 TABLET EVERY DAY  90 tablet  2  .  triazolam (HALCION) 0.25 MG tablet Take 1 tablet (0.25 mg total) by mouth at bedtime as needed. Sleep  30 tablet  5  . amLODipine (NORVASC) 10 MG tablet TAKE 1 TABLET EVERY DAY  90 tablet  2  . Omega-3 Fatty Acids (FISH OIL) 1200 MG CAPS Take 5 capsules by mouth daily.            Review of Systems Constitutional:  Negative for fever, chills, activity change and unexpected weight change.  HEENT:  Negative for hearing loss, ear pain, congestion, neck stiffness and postnasal drip. Negative for sore throat or swallowing problems. Negative for dental complaints.   Eyes: Negative for vision loss or change in visual acuity.  Respiratory: Negative for chest tightness and wheezing. Negative for DOE. .  Cardiovascular: Negative for chest pain or palpitations. No decreased exercise tolerance Gastrointestinal: No change in bowel habit. No bloating or gas. No reflux or indigestion Genitourinary: Negative for urgency, frequency, flank pain and difficulty urinating.  Musculoskeletal: Negative for myalgias, back pain, arthralgias and gait problem.  Neurological: Negative for dizziness, tremors,  weakness and headaches.  Hematological: Negative for adenopathy.  Psychiatric/Behavioral: Negative for behavioral problems and dysphoric mood.       Objective:   Physical Exam Filed Vitals:   07/07/12 0849  BP: 118/80  Pulse: 84  Temp: 97.4 F (36.3 C)  Resp: 16   Wt Readings from Last 3 Encounters:  07/07/12 239 lb (108.41 kg)  06/13/11 278 lb 6.4 oz (126.281 kg)  06/09/11 279 lb (126.554 kg)   Gen'l: Well nourished well developed white male in no acute distress  HEENT: Head: Normocephalic and atraumatic. Right Ear: External ear normal. EAC/TM nl. Left Ear: External ear normal.  EAC/TM nl. Nose: Nose normal. Mouth/Throat: Oropharynx is clear and moist. Dentition - native, in good repair. No buccal or palatal lesions. Posterior pharynx clear. Eyes: Conjunctivae and sclera clear. EOM intact. Pupils are equal, round, and reactive to light. Right eye exhibits no discharge. Left eye exhibits no discharge. Neck: Normal range of motion. Neck supple. No JVD present. No tracheal deviation present. No thyromegaly present.  Cardiovascular: Normal rate, regular rhythm, no gallop, no friction rub, no murmur heard.      Quiet precordium. 2+ radial and DP pulses . No carotid bruits Pulmonary/Chest: Effort normal. No respiratory distress or increased WOB, no wheezes, no rales. No chest wall deformity or CVAT. Abdomen: Soft. Bowel sounds are normal in all quadrants. He exhibits no distension, no tenderness, no rebound or guarding, No heptosplenomegaly. Well healed laprascopic scars, easily paplable lap band reservoir.  Genitourinary: deferred  Musculoskeletal: Normal range of motion. He exhibits no edema and no tenderness.       Small and large joints without redness, synovial thickening or deformity. Full range of motion preserved about all small, median and large joints.  Lymphadenopathy:    He has no cervical or supraclavicular adenopathy.  Neurological: He is alert and oriented to person, place,  and time. CN II-XII intact. DTRs 2+ and symmetrical biceps, radial and patellar tendons. Cerebellar function normal with no tremor, rigidity, normal gait and station.  Skin: Skin is warm and dry. No rash noted. No erythema.  Psychiatric: He has a normal mood and affect. His behavior is normal. Thought content normal.   Lab Results  Component Value Date   WBC 11.4* 03/24/2012   HGB 12.3* 03/24/2012   HCT 37.0* 03/24/2012   PLT 150.0 03/24/2012   GLUCOSE 99 07/07/2012   CHOL 167 07/07/2012  TRIG 95.0 07/07/2012   HDL 83.30 07/07/2012   LDLDIRECT 70.6 03/24/2012   LDLCALC 65 07/07/2012   ALT 26 07/07/2012   ALT 26 07/07/2012   AST 39* 07/07/2012   AST 39* 07/07/2012   NA 139 07/07/2012   K 4.9 07/07/2012   CL 103 07/07/2012   CREATININE 0.7 07/07/2012   BUN 16 07/07/2012   CO2 29 07/07/2012   TSH 2.38 07/07/2012   PSA 0.22 04/02/2009   HGBA1C 5.3 07/07/2012   MICROALBUR 4.2* 12/30/2007         Assessment & Plan:

## 2012-07-07 NOTE — Patient Instructions (Addendum)
Normal exam. You are doing great - keep up the GOOD work.  You will receive a full report in the mail.

## 2012-07-08 ENCOUNTER — Other Ambulatory Visit: Payer: Self-pay | Admitting: Internal Medicine

## 2012-07-11 ENCOUNTER — Other Ambulatory Visit: Payer: Self-pay | Admitting: Internal Medicine

## 2012-07-11 ENCOUNTER — Encounter: Payer: Self-pay | Admitting: Internal Medicine

## 2012-07-11 DIAGNOSIS — E119 Type 2 diabetes mellitus without complications: Secondary | ICD-10-CM

## 2012-07-11 MED ORDER — METFORMIN HCL 500 MG PO TABS: 500.0000 mg | ORAL_TABLET | Freq: Two times a day (BID) | ORAL | Status: DC

## 2012-07-14 ENCOUNTER — Other Ambulatory Visit: Payer: Self-pay | Admitting: Internal Medicine

## 2012-07-16 ENCOUNTER — Other Ambulatory Visit: Payer: Self-pay | Admitting: *Deleted

## 2012-07-16 MED ORDER — ENALAPRIL MALEATE 10 MG PO TABS: 10.0000 mg | ORAL_TABLET | Freq: Every day | ORAL | Status: DC

## 2012-07-16 NOTE — Telephone Encounter (Signed)
PATIENT REQUEST DOSE OF ENAPAPRIL 10MG   BE CALLED IN TO AVOID  1/2 THE 20 MG ALL THE TIME

## 2012-07-17 ENCOUNTER — Other Ambulatory Visit: Payer: Self-pay | Admitting: Internal Medicine

## 2012-08-05 ENCOUNTER — Other Ambulatory Visit: Payer: Self-pay | Admitting: *Deleted

## 2012-08-05 ENCOUNTER — Other Ambulatory Visit: Payer: Self-pay | Admitting: Internal Medicine

## 2012-08-09 NOTE — Assessment & Plan Note (Addendum)
Lab Results  Component Value Date   HGBA1C 5.3 07/07/2012   Excellent control on metformin.  Plan - continue the good work with diet and weight loss  Drug holiday from the metformin.  Recheck A1C in 3 months

## 2012-08-09 NOTE — Assessment & Plan Note (Signed)
Wt Readings from Last 3 Encounters:  07/07/12 239 lb (108.41 kg)  06/13/11 278 lb 6.4 oz (126.281 kg)  06/09/11 279 lb (126.554 kg)   Excellent weight loss.

## 2012-08-09 NOTE — Assessment & Plan Note (Signed)
LDL is very good, better than goal of 100 or less; triglycerides are well controlled. Liver functions are normal  Plan -  Continue niaspan

## 2012-08-09 NOTE — Assessment & Plan Note (Signed)
Lab Results  Component Value Date   TSH 2.38 07/07/2012   Normal range - no indication for medications

## 2012-08-09 NOTE — Assessment & Plan Note (Signed)
BP Readings from Last 3 Encounters:  07/07/12 118/80  09/27/11 144/95  09/26/11 111/67   Good control.  Plan - continue enalapril and amlodipine.

## 2012-10-27 ENCOUNTER — Other Ambulatory Visit: Payer: Self-pay | Admitting: Internal Medicine

## 2012-11-06 ENCOUNTER — Other Ambulatory Visit: Payer: Self-pay | Admitting: Internal Medicine

## 2012-11-22 ENCOUNTER — Other Ambulatory Visit: Payer: Self-pay | Admitting: Internal Medicine

## 2012-12-01 ENCOUNTER — Telehealth: Payer: Self-pay | Admitting: *Deleted

## 2012-12-01 NOTE — Telephone Encounter (Signed)
PATIENT CALLED AND STATED HE HAD HAD A FALL AND WAS HAVING HIP PAIN. ALSO REQUEST LAB WORK BE DONE. LEFT MESSAGE ON PATIENT CB# [phone] OF NEED TO MAKE APPT. WITH DR. Debby Bud TO BE CHECKED DUE TO HIS FALL.

## 2012-12-01 NOTE — Telephone Encounter (Signed)
Please read phone note below and advise. 

## 2012-12-06 ENCOUNTER — Encounter: Payer: Self-pay | Admitting: Internal Medicine

## 2012-12-06 ENCOUNTER — Other Ambulatory Visit (INDEPENDENT_AMBULATORY_CARE_PROVIDER_SITE_OTHER): Payer: BC Managed Care – PPO

## 2012-12-06 ENCOUNTER — Ambulatory Visit (INDEPENDENT_AMBULATORY_CARE_PROVIDER_SITE_OTHER): Payer: BC Managed Care – PPO | Admitting: Internal Medicine

## 2012-12-06 ENCOUNTER — Ambulatory Visit (INDEPENDENT_AMBULATORY_CARE_PROVIDER_SITE_OTHER)
Admission: RE | Admit: 2012-12-06 | Discharge: 2012-12-06 | Disposition: A | Discharge status: Home / Self Care | Payer: BC Managed Care – PPO | Source: Ambulatory Visit | Attending: Internal Medicine | Admitting: Internal Medicine

## 2012-12-06 VITALS — BP 118/68 | HR 89 | Temp 98.0°F | Resp 12 | Wt 231.1 lb

## 2012-12-06 DIAGNOSIS — E119 Type 2 diabetes mellitus without complications: Secondary | ICD-10-CM

## 2012-12-06 DIAGNOSIS — E785 Hyperlipidemia, unspecified: Secondary | ICD-10-CM

## 2012-12-06 DIAGNOSIS — E669 Obesity, unspecified: Secondary | ICD-10-CM

## 2012-12-06 DIAGNOSIS — Q742 Other congenital malformations of lower limb(s), including pelvic girdle: Secondary | ICD-10-CM

## 2012-12-06 DIAGNOSIS — I10 Essential (primary) hypertension: Secondary | ICD-10-CM

## 2012-12-06 LAB — LIPID PANEL
Cholesterol: 151 mg/dL (ref 0–200)
HDL: 73.3 mg/dL (ref >39.00)
LDL Cholesterol: 45 mg/dL (ref 0–99)
Total CHOL/HDL Ratio: 2
Triglycerides: 166 mg/dL — ABNORMAL HIGH (ref 0.0–149.0)
VLDL: 33.2 mg/dL (ref 0.0–40.0)

## 2012-12-06 LAB — HEMOGLOBIN A1C: Hgb A1c MFr Bld: 6 % (ref 4.6–6.5)

## 2012-12-06 NOTE — Patient Instructions (Addendum)
Bony abnormality of the left femur - most likely a bony reaction to trauma. Plan - x-ray of the femur  Diabetes - for A1C today. May be able to come off medication  Triglycerides - for lab today.

## 2012-12-06 NOTE — Progress Notes (Signed)
Subjective:    Patient ID: [name] [name], male    DOB: 1956-05-31, 57 y.o.   MRN: [id number]  HPI Larey Seat, hit hard on the left hip. Has good movement and no pain. He has a hard prominent nodule proximal lateral left thigh.  He has continue to loose weight through diet control and exercise.  Due for follow up A1C and lipid panel with hopes that weight loss and exercise will result in better indices and possibly to curtailing medication.  Past Medical History  Diagnosis Date  . Hypertension   . Diabetes mellitus     type 2  . GERD (gastroesophageal reflux disease)   . Hyperlipidemia   . 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 Hyperlipidemia    Past Surgical History  Procedure Date  . Arthroscopy x 1 left (rendall)     X 2 right  . Ganglion cyst excision     right wrist (kuzma)  . Correction of deviated septum   . Tonsillectomy   . Pilonidal cyst / sinus excision 57 yrs old  . Gastric banding port revision    Family History  Problem Relation Age of Onset  . Cancer Father     lung cancer metatstatic cancer to brain  . Coronary artery disease Father   . Heart attack Father     X 2 (41,45)  . Heart disease Father     CAD/MI x 2  . Cancer Mother     rapid on set  . Hypertension Mother   . Diabetes Neg Hx   . Parkinsonism Maternal Grandfather    History   Social History  . Marital Status: Married    Spouse Name: N/A    Number of Children: 2  . Years of Education: 12   Occupational History  . Trucking     Social History Main Topics  . Smoking status: Former Smoker    Quit date: 06/13/1995  . Smokeless tobacco: Never Used  . Alcohol Use: 1.8 oz/week    3 Glasses of wine per week  . Drug Use: No  . Sexually Active: Yes -- Male partner(s)   Other Topics Concern  . Not on file   Social History Narrative   HSG, Denice Bors - BA business. Married '76  2 sons - '77, '80. Marriage in good health. 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     Current Outpatient Prescriptions on File Prior to Visit  Medication Sig Dispense Refill  . amLODipine (NORVASC) 10 MG tablet TAKE 1 TABLET EVERY DAY  90 tablet  3  . aspirin 81 MG tablet Take 81 mg by mouth daily.        Marland Kitchen CIALIS 20 MG tablet TAKE 1 BY MOUTH AS NEEDED  6 tablet  1  . enalapril (VASOTEC) 10 MG tablet Take 1 tablet (10 mg total) by mouth daily.  90 tablet  3  . gabapentin (NEURONTIN) 600 MG tablet TAKE 2 TABLETS (1,200 MG TOTAL) BY MOUTH 3 (THREE) TIMES DAILY.  540 tablet  0  . ibuprofen (ADVIL,MOTRIN) 200 MG tablet Take 200 mg by mouth 3 (three) times daily.        . metFORMIN (GLUCOPHAGE) 500 MG tablet Take 1 tablet (500 mg total) by mouth 2 (two) times daily with a meal.  60 tablet  11  . Multiple Vitamins-Minerals (ONE-A-DAY EXTRAS ANTIOXIDANT) CAPS Take 1 capsule by mouth daily.        Marland Kitchen NIASPAN  1000 MG CR tablet TAKE 2 TABLETS EVERY DAY  180 tablet  1  . Omega-3 Fatty Acids (FISH OIL) 1200 MG CAPS Take 5 capsules by mouth daily.       . pantoprazole (PROTONIX) 40 MG tablet TAKE 1 TABLET EVERY DAY  90 tablet  2  . triazolam (HALCION) 0.25 MG tablet TAKE 1 TABLET AT BEDTIME AS NEEDED FOR SLEEP  30 tablet  2      Review of Systems System review is negative for any constitutional, cardiac, pulmonary, GI or neuro symptoms or complaints other than as described in the HPI.     Objective:   Physical Exam Filed Vitals:   12/06/12 1109  BP: 118/68  Pulse: 89  Temp: 98 F (36.7 C)  Resp: 12   Wt Readings from Last 3 Encounters:  12/06/12 231 lb 1.9 oz (104.835 kg)  07/07/12 239 lb (108.41 kg)  06/13/11 278 lb 6.4 oz (126.281 kg)   Gen'l - WNWD white man with clear loss of weight by appearance Cor- RRR Pulm - normal respirations MSK - proximal left thigh with a hard prominence laterally below the level of the greater trochanter. Able to walk and bear weight w/o paing.  Lab: LDL 45; A1C 6%      Assessment & Plan:  Abnormal nodule left thigh.  X-ray left femur is normal. Suspect organized hematoma  Plan- watchful waiting.

## 2012-12-06 NOTE — Assessment & Plan Note (Signed)
BP Readings from Last 3 Encounters:  12/06/12 118/68  07/07/12 118/80  09/27/11 144/95   Excellent control.

## 2012-12-06 NOTE — Assessment & Plan Note (Signed)
Lipid panel with LDL of 45!! Triglycerides 166  Plan  Drug holiday - stop niacin

## 2012-12-06 NOTE — Assessment & Plan Note (Signed)
Doing great - has lost 47 lbs in 17 months. BMI down from 34.8 to 28.9  Plan Keep up the good work.

## 2012-12-06 NOTE — Assessment & Plan Note (Signed)
Tremendous weight loss. A1c is normal range last two labs: 5.3% and now 6%  Plan - drug holiday - stop metformin.  Repeat A1c in 3 months.

## 2012-12-07 ENCOUNTER — Telehealth: Payer: Self-pay | Admitting: *Deleted

## 2012-12-07 NOTE — Telephone Encounter (Signed)
Called pt and let him know that x-ray showed a normal femur. Hard place on thigh must be organized hematoma - not a worry. If he sees any changes, give [place] a call. Pt understood.

## 2012-12-08 ENCOUNTER — Encounter: Payer: Self-pay | Admitting: Internal Medicine

## 2012-12-08 DIAGNOSIS — E785 Hyperlipidemia, unspecified: Secondary | ICD-10-CM

## 2012-12-08 DIAGNOSIS — E119 Type 2 diabetes mellitus without complications: Secondary | ICD-10-CM

## 2012-12-09 ENCOUNTER — Ambulatory Visit (INDEPENDENT_AMBULATORY_CARE_PROVIDER_SITE_OTHER): Payer: BC Managed Care – PPO | Admitting: Internal Medicine

## 2012-12-09 ENCOUNTER — Encounter: Payer: Self-pay | Admitting: Internal Medicine

## 2012-12-09 VITALS — BP 122/82 | HR 100 | Temp 97.6°F | Resp 10 | Wt 234.1 lb

## 2012-12-09 DIAGNOSIS — I10 Essential (primary) hypertension: Secondary | ICD-10-CM

## 2012-12-09 DIAGNOSIS — R55 Syncope and collapse: Secondary | ICD-10-CM

## 2012-12-09 DIAGNOSIS — R29898 Other symptoms and signs involving the musculoskeletal system: Secondary | ICD-10-CM

## 2012-12-09 NOTE — Patient Instructions (Addendum)
Leg weakness and collapse - low sugar vs neurologic problem vs back issues.  Plan Stop metformin   CT brain tomorrow or at the latest Saturday.

## 2012-12-10 ENCOUNTER — Other Ambulatory Visit (INDEPENDENT_AMBULATORY_CARE_PROVIDER_SITE_OTHER): Payer: BC Managed Care – PPO

## 2012-12-10 ENCOUNTER — Ambulatory Visit (INDEPENDENT_AMBULATORY_CARE_PROVIDER_SITE_OTHER)
Admission: RE | Admit: 2012-12-10 | Discharge: 2012-12-10 | Disposition: A | Discharge status: Home / Self Care | Payer: BC Managed Care – PPO | Source: Ambulatory Visit | Attending: Internal Medicine | Admitting: Internal Medicine

## 2012-12-10 DIAGNOSIS — R29898 Other symptoms and signs involving the musculoskeletal system: Secondary | ICD-10-CM

## 2012-12-10 DIAGNOSIS — I10 Essential (primary) hypertension: Secondary | ICD-10-CM

## 2012-12-10 DIAGNOSIS — R55 Syncope and collapse: Secondary | ICD-10-CM

## 2012-12-10 DIAGNOSIS — E119 Type 2 diabetes mellitus without complications: Secondary | ICD-10-CM

## 2012-12-10 DIAGNOSIS — E785 Hyperlipidemia, unspecified: Secondary | ICD-10-CM

## 2012-12-10 LAB — COMPREHENSIVE METABOLIC PANEL
ALT: 25 U/L (ref 0–53)
AST: 36 U/L (ref 0–37)
Albumin: 4.1 g/dL (ref 3.5–5.2)
Alkaline Phosphatase: 116 U/L (ref 39–117)
BUN: 16 mg/dL (ref 6–23)
CO2: 31 mEq/L (ref 19–32)
Calcium: 8.6 mg/dL (ref 8.4–10.5)
Chloride: 102 mEq/L (ref 96–112)
Creatinine, Ser: 0.7 mg/dL (ref 0.4–1.5)
GFR: 121.34 mL/min (ref >60.00)
Glucose, Bld: 97 mg/dL (ref 70–99)
Potassium: 4.8 mEq/L (ref 3.5–5.1)
Sodium: 138 mEq/L (ref 135–145)
Total Bilirubin: 0.4 mg/dL (ref 0.3–1.2)
Total Protein: 7.3 g/dL (ref 6.0–8.3)

## 2012-12-10 LAB — LIPID PANEL
Cholesterol: 166 mg/dL (ref 0–200)
HDL: 71.5 mg/dL (ref >39.00)
LDL Cholesterol: 61 mg/dL (ref 0–99)
Total CHOL/HDL Ratio: 2
Triglycerides: 169 mg/dL — ABNORMAL HIGH (ref 0.0–149.0)
VLDL: 33.8 mg/dL (ref 0.0–40.0)

## 2012-12-10 LAB — HEMOGLOBIN A1C: Hgb A1c MFr Bld: 6 % (ref 4.6–6.5)

## 2012-12-10 MED ORDER — IOHEXOL 300 MG/ML  SOLN
100.0000 mL | Freq: Once | INTRAMUSCULAR | Status: AC | PRN
  Administered 2012-12-10: 100 mL via INTRAVENOUS

## 2012-12-11 NOTE — Progress Notes (Signed)
  Subjective:    Patient ID: [name] [name], male    DOB: [birth year], 57 y.o.   MRN: [id number]  HPI [name] [name], who was just seen recently, is seen acutely due to 2-3 episodes of LE instability with subsequent falls. He has had no loss of consciousness. He denies any problems with weakness or paresthesias between episodes. He denies signs of hypoglycemia. He has had no headache, double vision or other focal neurologic signs other than the intermittent leg weakness.   PMH, FamHx and SocHx reviewed for any changes and relevance. Current Outpatient Prescriptions on File Prior to Visit  Medication Sig Dispense Refill  . amLODipine (NORVASC) 10 MG tablet TAKE 1 TABLET EVERY DAY  90 tablet  3  . aspirin 81 MG tablet Take 81 mg by mouth daily.        Marland Kitchen CIALIS 20 MG tablet TAKE 1 BY MOUTH AS NEEDED  6 tablet  1  . enalapril (VASOTEC) 10 MG tablet Take 1 tablet (10 mg total) by mouth daily.  90 tablet  3  . gabapentin (NEURONTIN) 600 MG tablet TAKE 2 TABLETS (1,200 MG TOTAL) BY MOUTH 3 (THREE) TIMES DAILY.  540 tablet  0  . ibuprofen (ADVIL,MOTRIN) 200 MG tablet Take 200 mg by mouth 3 (three) times daily.        . metFORMIN (GLUCOPHAGE) 500 MG tablet Take 1 tablet (500 mg total) by mouth 2 (two) times daily with a meal.  60 tablet  11  . Multiple Vitamins-Minerals (ONE-A-DAY EXTRAS ANTIOXIDANT) CAPS Take 1 capsule by mouth daily.        Marland Kitchen NIASPAN 1000 MG CR tablet TAKE 2 TABLETS EVERY DAY  180 tablet  1  . Omega-3 Fatty Acids (FISH OIL) 1200 MG CAPS Take 5 capsules by mouth daily.       . pantoprazole (PROTONIX) 40 MG tablet TAKE 1 TABLET EVERY DAY  90 tablet  2  . triazolam (HALCION) 0.25 MG tablet TAKE 1 TABLET AT BEDTIME AS NEEDED FOR SLEEP  30 tablet  2      Review of Systems System review is negative for any constitutional, cardiac, pulmonary, GI or neuro symptoms or complaints other than as described in the HPI.     Objective:   Physical Exam Filed Vitals:   12/09/12 1640  BP: 122/82   Pulse: 100  Temp: 97.6 F (36.4 C)  Resp: 10   Gen'l - WNWD white man in no distress Cor 2+ radial  Pulse, RRR Neuro - A& O x 3, normal cognitive function. CN II-XII normal. MS normal, Cerebellar function - normal with normal gait, able to sit and rise w/o difficulty.        Assessment & Plan:  Falls - patient with new onset instability and falls. Exam is normal. He relates that his father presented with similar symptoms when diagnosed with a brain tumor.  Plan CT brain.  Addendum - CT brain normal.  Plan - for continued symptoms neurology referral.

## 2013-01-03 ENCOUNTER — Telehealth: Payer: Self-pay | Admitting: General Practice

## 2013-01-03 DIAGNOSIS — M25559 Pain in unspecified hip: Secondary | ICD-10-CM

## 2013-01-03 NOTE — Addendum Note (Signed)
Addended by: Jacques Navy on: 01/03/2013 05:38 PM   Modules accepted: Orders

## 2013-01-03 NOTE — Telephone Encounter (Signed)
Referral to [place] same day clinic - for Tuesday 2/25

## 2013-01-03 NOTE — Telephone Encounter (Signed)
Pt called stating that his elbow has swollen up dramatically since his fall about three weeks ago. Had came in to see you for his hip. Would like to see if he can get a referral to an orthopedist.

## 2013-01-05 ENCOUNTER — Telehealth (INDEPENDENT_AMBULATORY_CARE_PROVIDER_SITE_OTHER): Payer: Self-pay | Admitting: Surgery

## 2013-01-05 NOTE — Telephone Encounter (Signed)
01/05/13 spoke to patient about making bariatric follow-up. Had lap band 06/18/10. Says he is doing great - has lost 142 lbs. He said he would call back later to schedule with the lap band clinic - his mother-in-law has cancer and they are planning a trip to [facility]. (lss)

## 2013-01-20 ENCOUNTER — Other Ambulatory Visit: Payer: Self-pay | Admitting: Internal Medicine

## 2013-01-21 NOTE — Telephone Encounter (Signed)
[facility]

## 2013-04-18 ENCOUNTER — Other Ambulatory Visit: Payer: Self-pay | Admitting: Internal Medicine

## 2013-04-18 NOTE — Telephone Encounter (Signed)
Triazolam called to pharmacy

## 2013-04-30 ENCOUNTER — Other Ambulatory Visit: Payer: Self-pay | Admitting: Internal Medicine

## 2013-06-04 ENCOUNTER — Other Ambulatory Visit: Payer: Self-pay | Admitting: Internal Medicine

## 2013-06-27 ENCOUNTER — Other Ambulatory Visit: Payer: Self-pay | Admitting: Internal Medicine

## 2013-08-01 ENCOUNTER — Other Ambulatory Visit: Payer: Self-pay | Admitting: Internal Medicine

## 2013-08-22 ENCOUNTER — Other Ambulatory Visit: Payer: Self-pay | Admitting: Internal Medicine

## 2013-09-14 LAB — HM DIABETES EYE EXAM

## 2013-09-16 ENCOUNTER — Other Ambulatory Visit: Payer: Self-pay | Admitting: Orthopedic Surgery

## 2013-09-16 ENCOUNTER — Encounter (HOSPITAL_BASED_OUTPATIENT_CLINIC_OR_DEPARTMENT_OTHER): Payer: Self-pay | Admitting: *Deleted

## 2013-09-16 NOTE — Progress Notes (Signed)
To come in for bmet-ekg-last lost 170lb since lap band-off all diabetic meds now

## 2013-09-19 ENCOUNTER — Encounter (HOSPITAL_BASED_OUTPATIENT_CLINIC_OR_DEPARTMENT_OTHER)
Admission: RE | Admit: 2013-09-19 | Discharge: 2013-09-19 | Disposition: A | Discharge status: Home / Self Care | Payer: BC Managed Care – PPO | Source: Ambulatory Visit | Attending: Orthopedic Surgery | Admitting: Orthopedic Surgery

## 2013-09-19 LAB — BASIC METABOLIC PANEL
BUN: 17 mg/dL (ref 6–23)
CO2: 29 mEq/L (ref 19–32)
Calcium: 9.5 mg/dL (ref 8.4–10.5)
Chloride: 100 mEq/L (ref 96–112)
Creatinine, Ser: 0.82 mg/dL (ref 0.50–1.35)
GFR calc Af Amer: >90 mL/min (ref >90)
GFR calc non Af Amer: >90 mL/min (ref >90)
Glucose, Bld: 125 mg/dL — ABNORMAL HIGH (ref 70–99)
Potassium: 4.2 mEq/L (ref 3.5–5.1)
Sodium: 140 mEq/L (ref 135–145)

## 2013-09-21 ENCOUNTER — Encounter (HOSPITAL_BASED_OUTPATIENT_CLINIC_OR_DEPARTMENT_OTHER)
Admission: RE | Disposition: A | Discharge status: Home / Self Care | Payer: Self-pay | Source: Ambulatory Visit | Attending: Orthopedic Surgery

## 2013-09-21 ENCOUNTER — Ambulatory Visit (HOSPITAL_BASED_OUTPATIENT_CLINIC_OR_DEPARTMENT_OTHER)
Admission: RE | Admit: 2013-09-21 | Discharge: 2013-09-21 | Disposition: A | Discharge status: Home / Self Care | Payer: BC Managed Care – PPO | Source: Ambulatory Visit | Attending: Orthopedic Surgery | Admitting: Orthopedic Surgery

## 2013-09-21 ENCOUNTER — Encounter (HOSPITAL_BASED_OUTPATIENT_CLINIC_OR_DEPARTMENT_OTHER): Payer: BC Managed Care – PPO | Admitting: Anesthesiology

## 2013-09-21 ENCOUNTER — Encounter (HOSPITAL_BASED_OUTPATIENT_CLINIC_OR_DEPARTMENT_OTHER): Payer: Self-pay | Admitting: *Deleted

## 2013-09-21 ENCOUNTER — Ambulatory Visit (HOSPITAL_BASED_OUTPATIENT_CLINIC_OR_DEPARTMENT_OTHER): Payer: BC Managed Care – PPO | Admitting: Anesthesiology

## 2013-09-21 DIAGNOSIS — IMO0002 Reserved for concepts with insufficient information to code with codable children: Secondary | ICD-10-CM | POA: Insufficient documentation

## 2013-09-21 DIAGNOSIS — R296 Repeated falls: Secondary | ICD-10-CM | POA: Insufficient documentation

## 2013-09-21 DIAGNOSIS — S53449A Ulnar collateral ligament sprain of unspecified elbow, initial encounter: Secondary | ICD-10-CM | POA: Insufficient documentation

## 2013-09-21 DIAGNOSIS — M19049 Primary osteoarthritis, unspecified hand: Secondary | ICD-10-CM | POA: Insufficient documentation

## 2013-09-21 DIAGNOSIS — E785 Hyperlipidemia, unspecified: Secondary | ICD-10-CM | POA: Insufficient documentation

## 2013-09-21 DIAGNOSIS — Z885 Allergy status to narcotic agent status: Secondary | ICD-10-CM | POA: Insufficient documentation

## 2013-09-21 DIAGNOSIS — I10 Essential (primary) hypertension: Secondary | ICD-10-CM | POA: Insufficient documentation

## 2013-09-21 DIAGNOSIS — K219 Gastro-esophageal reflux disease without esophagitis: Secondary | ICD-10-CM | POA: Insufficient documentation

## 2013-09-21 DIAGNOSIS — Z880 Allergy status to penicillin: Secondary | ICD-10-CM | POA: Insufficient documentation

## 2013-09-21 HISTORY — PX: ULNAR COLLATERAL LIGAMENT REPAIR: SHX6159

## 2013-09-21 LAB — POCT HEMOGLOBIN-HEMACUE: Hemoglobin: 13 g/dL (ref 13.0–17.0)

## 2013-09-21 SURGERY — REPAIR, LIGAMENT, ULNAR COLLATERAL
Anesthesia: Regional | Site: Thumb | Laterality: Right | Wound class: Clean

## 2013-09-21 MED ORDER — OXYCODONE HCL 5 MG/5ML PO SOLN: 5.0000 mg | Freq: Once | ORAL | Status: DC | PRN

## 2013-09-21 MED ORDER — PROPOFOL 10 MG/ML IV EMUL
INTRAVENOUS | Status: AC
  Filled 2013-09-21: qty 50

## 2013-09-21 MED ORDER — PROPOFOL 10 MG/ML IV BOLUS
INTRAVENOUS | Status: DC | PRN
  Administered 2013-09-21: 250 mg via INTRAVENOUS
  Administered 2013-09-21: 50 mg via INTRAVENOUS

## 2013-09-21 MED ORDER — ONDANSETRON HCL 4 MG/2ML IJ SOLN: 4.0000 mg | Freq: Four times a day (QID) | INTRAMUSCULAR | Status: DC | PRN

## 2013-09-21 MED ORDER — FENTANYL CITRATE 0.05 MG/ML IJ SOLN
INTRAMUSCULAR | Status: AC
  Filled 2013-09-21: qty 2

## 2013-09-21 MED ORDER — VANCOMYCIN HCL IN DEXTROSE 1-5 GM/200ML-% IV SOLN
INTRAVENOUS | Status: AC
  Filled 2013-09-21: qty 200

## 2013-09-21 MED ORDER — BUPIVACAINE HCL (PF) 0.25 % IJ SOLN
INTRAMUSCULAR | Status: AC
  Filled 2013-09-21: qty 30

## 2013-09-21 MED ORDER — MIDAZOLAM HCL 2 MG/2ML IJ SOLN
INTRAMUSCULAR | Status: AC
  Filled 2013-09-21: qty 2

## 2013-09-21 MED ORDER — FENTANYL CITRATE 0.05 MG/ML IJ SOLN
50.0000 ug | INTRAMUSCULAR | Status: DC | PRN
  Administered 2013-09-21: 100 ug via INTRAVENOUS

## 2013-09-21 MED ORDER — OXYCODONE HCL 5 MG PO TABS: 5.0000 mg | ORAL_TABLET | Freq: Once | ORAL | Status: DC | PRN

## 2013-09-21 MED ORDER — GLYCOPYRROLATE 0.2 MG/ML IJ SOLN
INTRAMUSCULAR | Status: DC | PRN
  Administered 2013-09-21: 0.2 mg via INTRAVENOUS

## 2013-09-21 MED ORDER — LACTATED RINGERS IV SOLN
INTRAVENOUS | Status: DC
  Administered 2013-09-21 (×2): via INTRAVENOUS

## 2013-09-21 MED ORDER — VANCOMYCIN HCL IN DEXTROSE 1-5 GM/200ML-% IV SOLN
1000.0000 mg | INTRAVENOUS | Status: AC
  Administered 2013-09-21: 1000 mg via INTRAVENOUS

## 2013-09-21 MED ORDER — CHLORHEXIDINE GLUCONATE 4 % EX LIQD
60.0000 mL | Freq: Once | CUTANEOUS | Status: AC
  Administered 2013-09-21: 4 via TOPICAL

## 2013-09-21 MED ORDER — FENTANYL CITRATE 0.05 MG/ML IJ SOLN: 25.0000 ug | INTRAMUSCULAR | Status: DC | PRN

## 2013-09-21 MED ORDER — OXYCODONE-ACETAMINOPHEN 10-325 MG PO TABS: 1.0000 | ORAL_TABLET | ORAL | Status: DC | PRN

## 2013-09-21 MED ORDER — ONDANSETRON HCL 4 MG/2ML IJ SOLN
INTRAMUSCULAR | Status: DC | PRN
  Administered 2013-09-21: 4 mg via INTRAVENOUS

## 2013-09-21 MED ORDER — BUPIVACAINE-EPINEPHRINE PF 0.5-1:200000 % IJ SOLN
INTRAMUSCULAR | Status: DC | PRN
  Administered 2013-09-21: 30 mL

## 2013-09-21 MED ORDER — CHLORHEXIDINE GLUCONATE 4 % EX LIQD: 60.0000 mL | Freq: Once | CUTANEOUS | Status: DC

## 2013-09-21 MED ORDER — MIDAZOLAM HCL 2 MG/2ML IJ SOLN
1.0000 mg | INTRAMUSCULAR | Status: DC | PRN
  Administered 2013-09-21: 2 mg via INTRAVENOUS

## 2013-09-21 MED ORDER — FENTANYL CITRATE 0.05 MG/ML IJ SOLN
INTRAMUSCULAR | Status: AC
  Filled 2013-09-21: qty 4

## 2013-09-21 SURGICAL SUPPLY — 66 items
ANCHOR JUGGERKNOT 1.0 3-0 NLD (Anchor) ×4 IMPLANT
BAG DECANTER FOR FLEXI CONT (MISCELLANEOUS) IMPLANT
BANDAGE GAUZE ELAST BULKY 4 IN (GAUZE/BANDAGES/DRESSINGS) ×2 IMPLANT
BLADE MINI RND TIP GREEN BEAV (BLADE) IMPLANT
BLADE SURG 15 STRL LF DISP TIS (BLADE) ×1 IMPLANT
BLADE SURG 15 STRL SS (BLADE) ×1
BNDG COHESIVE 3X5 TAN STRL LF (GAUZE/BANDAGES/DRESSINGS) ×2 IMPLANT
BNDG ESMARK 4X9 LF (GAUZE/BANDAGES/DRESSINGS) ×2 IMPLANT
CHLORAPREP W/TINT 26ML (MISCELLANEOUS) ×2 IMPLANT
CORDS BIPOLAR (ELECTRODE) ×2 IMPLANT
COVER MAYO STAND STRL (DRAPES) ×2 IMPLANT
COVER TABLE BACK 60X90 (DRAPES) ×2 IMPLANT
CUFF TOURNIQUET SINGLE 18IN (TOURNIQUET CUFF) ×2 IMPLANT
DECANTER SPIKE VIAL GLASS SM (MISCELLANEOUS) ×2 IMPLANT
DRAPE EXTREMITY T 121X128X90 (DRAPE) ×2 IMPLANT
DRAPE OEC MINIVIEW 54X84 (DRAPES) ×2 IMPLANT
DRAPE SURG 17X23 STRL (DRAPES) ×2 IMPLANT
DRSG KUZMA FLUFF (GAUZE/BANDAGES/DRESSINGS) IMPLANT
GAUZE XEROFORM 1X8 LF (GAUZE/BANDAGES/DRESSINGS) ×2 IMPLANT
GLOVE BIO SURGEON STRL SZ 6.5 (GLOVE) ×2 IMPLANT
GLOVE BIOGEL PI IND STRL 7.0 (GLOVE) ×1 IMPLANT
GLOVE BIOGEL PI IND STRL 8.5 (GLOVE) ×1 IMPLANT
GLOVE BIOGEL PI INDICATOR 7.0 (GLOVE) ×1
GLOVE BIOGEL PI INDICATOR 8.5 (GLOVE) ×1
GLOVE SURG ORTHO 8.0 STRL STRW (GLOVE) ×2 IMPLANT
GOWN BRE IMP PREV XXLGXLNG (GOWN DISPOSABLE) ×2 IMPLANT
GOWN PREVENTION PLUS XLARGE (GOWN DISPOSABLE) ×2 IMPLANT
K-WIRE .035X4 (WIRE) ×2 IMPLANT
NDL SAFETY ECLIPSE 18X1.5 (NEEDLE) IMPLANT
NEEDLE 27GAX1X1/2 (NEEDLE) IMPLANT
NEEDLE FISTULA 1/2 CIRCLE (NEEDLE) IMPLANT
NEEDLE HYPO 18GX1.5 SHARP (NEEDLE)
NEEDLE KEITH (NEEDLE) IMPLANT
NEEDLE KEITH SZ10 STRAIGHT (NEEDLE) IMPLANT
NS IRRIG 1000ML POUR BTL (IV SOLUTION) ×2 IMPLANT
PACK BASIN DAY SURGERY FS (CUSTOM PROCEDURE TRAY) ×2 IMPLANT
PAD CAST 3X4 CTTN HI CHSV (CAST SUPPLIES) ×1 IMPLANT
PAD CAST 4YDX4 CTTN HI CHSV (CAST SUPPLIES) IMPLANT
PADDING CAST ABS 4INX4YD NS (CAST SUPPLIES)
PADDING CAST ABS COTTON 4X4 ST (CAST SUPPLIES) IMPLANT
PADDING CAST COTTON 3X4 STRL (CAST SUPPLIES) ×1
PADDING CAST COTTON 4X4 STRL (CAST SUPPLIES)
PASSER SUT SWANSON 36MM LOOP (INSTRUMENTS) IMPLANT
SLEEVE SCD COMPRESS KNEE MED (MISCELLANEOUS) ×2 IMPLANT
SPLINT PLASTER CAST XFAST 3X15 (CAST SUPPLIES) ×10 IMPLANT
SPLINT PLASTER XTRA FASTSET 3X (CAST SUPPLIES) ×10
SPONGE GAUZE 4X4 12PLY (GAUZE/BANDAGES/DRESSINGS) ×2 IMPLANT
STOCKINETTE 4X48 STRL (DRAPES) ×2 IMPLANT
SUT ETHIBOND 3-0 V-5 (SUTURE) IMPLANT
SUT FIBERWIRE 2-0 18 17.9 3/8 (SUTURE)
SUT FIBERWIRE 3-0 18 TAPR NDL (SUTURE)
SUT MERSILENE 2.0 SH NDLE (SUTURE) IMPLANT
SUT MERSILENE 4 0 P 3 (SUTURE) ×2 IMPLANT
SUT MERSILENE 6 0 P 1 (SUTURE) IMPLANT
SUT SILK 4 0 PS 2 (SUTURE) IMPLANT
SUT STEEL 3 0 (SUTURE) IMPLANT
SUT STEEL 4 0 (SUTURE) IMPLANT
SUT STEEL 4 0 V 26 (SUTURE) IMPLANT
SUT VICRYL 4-0 PS2 18IN ABS (SUTURE) IMPLANT
SUT VICRYL RAPIDE 4/0 PS 2 (SUTURE) ×2 IMPLANT
SUTURE FIBERWR 2-0 18 17.9 3/8 (SUTURE) IMPLANT
SUTURE FIBERWR 3-0 18 TAPR NDL (SUTURE) IMPLANT
SYR BULB 3OZ (MISCELLANEOUS) ×2 IMPLANT
SYR CONTROL 10ML LL (SYRINGE) IMPLANT
TOWEL OR 17X24 6PK STRL BLUE (TOWEL DISPOSABLE) ×4 IMPLANT
UNDERPAD 30X30 INCONTINENT (UNDERPADS AND DIAPERS) ×2 IMPLANT

## 2013-09-21 NOTE — Anesthesia Postprocedure Evaluation (Signed)
Anesthesia Post Note  Patient: [name] [name]  Procedure(s) Performed: Procedure(s) (LRB): ULNAR COLLATERAL LIGAMENT REPAIR METACARPAL PHALANGE RIGHT THUMB POSSIBLE APL GRAFT RECONSTRUCTION (Right)  Anesthesia type: General  Patient location: PACU  Post pain: Pain level controlled and Adequate analgesia  Post assessment: Post-op Vital signs reviewed, Patient's Cardiovascular Status Stable, Respiratory Function Stable, Patent Airway and Pain level controlled  Last Vitals:  Filed Vitals:   09/21/13 1442  BP: 134/86  Pulse: 68  Temp: 36.6 C  Resp: 16    Post vital signs: Reviewed and stable  Level of consciousness: awake, alert  and oriented  Complications: No apparent anesthesia complications

## 2013-09-21 NOTE — Anesthesia Procedure Notes (Addendum)
Anesthesia Regional Block:  Infraclavicular brachial plexus block  Pre-Anesthetic Checklist: ,, timeout performed, Correct Patient, Correct Site, Correct Laterality, Correct Procedure, Correct Position, site marked, Risks and benefits discussed,  Surgical consent,  Pre-op evaluation,  At surgeon's request and post-op pain management  Laterality: Right  Prep: chloraprep       Needles:  Injection technique: Single-shot  Needle Type: Echogenic Stimulator Needle      Needle Gauge: 21 and 21 G    Additional Needles:  Procedures: ultrasound guided (picture in chart) and nerve stimulator Infraclavicular brachial plexus block  Nerve Stimulator or Paresthesia:  Response: biceps flexion, 0.45 mA,   Additional Responses:   Narrative:  Start time: 09/21/2013 11:01 AM End time: 09/21/2013 11:13 AM Injection made incrementally with aspirations every 5 mL.  Performed by: Personally  Anesthesiologist: Dr Chaney Malling  Additional Notes: Functioning IV was confirmed and monitors were applied.  A 90mm 21ga Arrow echogenic stimulator needle was used. Sterile prep and drape,hand hygiene and sterile gloves were used.  Negative aspiration and negative test dose prior to incremental administration of local anesthetic. The patient tolerated the procedure well.  Ultrasound guidance: relevent anatomy identified, needle position confirmed, local anesthetic spread visualized around nerve(s), vascular puncture avoided.  Image printed for medical record.   Infraclavicular brachial plexus block Procedure Name: LMA Insertion Date/Time: 09/21/2013 12:37 PM Performed by: Gar Gibbon Pre-anesthesia Checklist: Patient identified, Emergency Drugs available, Suction available and Patient being monitored Patient Re-evaluated:Patient Re-evaluated prior to inductionOxygen Delivery Method: Circle System Utilized Preoxygenation: Pre-oxygenation with 100% oxygen Intubation Type: IV induction Ventilation: Mask  ventilation without difficulty LMA: LMA inserted LMA Size: 5.0 Number of attempts: 1 Airway Equipment and Method: bite block Placement Confirmation: positive ETCO2 Tube secured with: Tape Dental Injury: Teeth and Oropharynx as per pre-operative assessment

## 2013-09-21 NOTE — Transfer of Care (Signed)
Immediate Anesthesia Transfer of Care Note  Patient: [name] [name]  Procedure(s) Performed: Procedure(s): ULNAR COLLATERAL LIGAMENT REPAIR METACARPAL PHALANGE RIGHT THUMB POSSIBLE APL GRAFT RECONSTRUCTION (Right)  Patient Location: PACU  Anesthesia Type:GA combined with regional for post-op pain  Level of Consciousness: awake, alert , oriented and patient cooperative  Airway & Oxygen Therapy: Patient Spontanous Breathing and Patient connected to face mask oxygen  Post-op Assessment: Report given to PACU RN and Post -op Vital signs reviewed and stable  Post vital signs: Reviewed and stable  Complications: No apparent anesthesia complications

## 2013-09-21 NOTE — H&P (Signed)
[name] [name] is a former patient who suffered an injury to his left nondominant thumb. He complains of pain at the MCP joint. He states he has decreased grip and pinch. The injury occurred 2 months ago. He fell at home and caught his thumb. He complains of constant severe, throbbing and aching pain with a feeling of swelling, numbness and weakness. He states the thumb is getting worse. Activity makes it worse, ice helps. He has no prior history of injury. He has a history of arthritis. He has no history of diabetes, thyroid problems, or gout. There is a negative family history for each.  PAST MEDICAL HISTORY: He is allergic to PCN. He is on Norvasc, Pantoprozole and Enalapril. He has had surgery on his knees and wrist.  FAMILY H ISTORY: Positive for heart disease, high BP and arthritis.  SOCIAL HISTORY: He does not smoke. He drinks socially. He is the owner of [employer].  REVIEW OF SYSTEMS: Positive for glasses, high BP otherwise negative. [name] is an 57 y.o. male.   Chief Complaint: Rupture UCL MCP lt Thumb HPI: see above  Past Medical History  Diagnosis Date  . Hypertension   . GERD (gastroesophageal reflux disease)   . Hyperlipidemia   . 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 Hyperlipidemia   . Diabetes mellitus     type 2-off all meds now after wt loss    Past Surgical History  Procedure Laterality Date  . Arthroscopy x 1 left (rendall)      X 2 right  . Ganglion cyst excision      right wrist (Leandria Thier)  . Correction of deviated septum    . Tonsillectomy    . Pilonidal cyst / sinus excision  57 yrs old  . Gastric banding port revision  8/11    Family History  Problem Relation Age of Onset  . Cancer Father     lung cancer metatstatic cancer to brain  . Coronary artery disease Father   . Heart attack Father     X 2 (41,45)  . Heart disease Father     CAD/MI x 2  . Cancer Mother     rapid on set  . Hypertension Mother   . Diabetes Neg Hx   .  Parkinsonism Maternal Grandfather    Social History:  reports that he quit smoking about 18 years ago. He has never used smokeless tobacco. He reports that he drinks about 1.8 ounces of alcohol per week. He reports that he does not use illicit drugs.  Allergies:  Allergies  Allergen Reactions  . Penicillins Anaphylaxis  . Codeine Nausea And Vomiting    Medications Prior to Admission  Medication Sig Dispense Refill  . aspirin 81 MG tablet Take 81 mg by mouth daily.        Marland Kitchen CIALIS 20 MG tablet TAKE 1 TABLET BY MOUTH AS NEEDED  6 tablet  1  . enalapril (VASOTEC) 10 MG tablet Take 1 tablet (10 mg total) by mouth daily.  90 tablet  3  . ibuprofen (ADVIL,MOTRIN) 200 MG tablet Take 200 mg by mouth 3 (three) times daily.        . Multiple Vitamins-Minerals (ONE-A-DAY EXTRAS ANTIOXIDANT) CAPS Take 1 capsule by mouth daily.        . pantoprazole (PROTONIX) 40 MG tablet TAKE 1 TABLET EVERY DAY  90 tablet  2  . triazolam (HALCION) 0.25 MG tablet TAKE 1 TABLET AT BEDTIME AS NEEDED FOR  SLEEP  30 tablet  2  . amLODipine (NORVASC) 10 MG tablet TAKE 1 TABLET EVERY DAY  90 tablet  3    Results for orders placed during the hospital encounter of 09/21/13 (from the past 48 hour(s))  BASIC METABOLIC PANEL     Status: Abnormal   Collection Time    09/19/13 11:10 AM      Result Value Range   Sodium 140  135 - 145 mEq/L   Potassium 4.2  3.5 - 5.1 mEq/L   Chloride 100  96 - 112 mEq/L   CO2 29  19 - 32 mEq/L   Glucose, Bld 125 (*) 70 - 99 mg/dL   BUN 17  6 - 23 mg/dL   Creatinine, Ser 1.61  0.50 - 1.35 mg/dL   Calcium 9.5  8.4 - 09.6 mg/dL   GFR calc non Af Amer >90  >90 mL/min   GFR calc Af Amer >90  >90 mL/min   Comment: (NOTE)     The eGFR has been calculated using the CKD EPI equation.     This calculation has not been validated in all clinical situations.     eGFR's persistently <90 mL/min signify possible Chronic Kidney     Disease.  POCT HEMOGLOBIN-HEMACUE     Status: None   Collection Time     09/21/13 10:04 AM      Result Value Range   Hemoglobin 13.0  13.0 - 17.0 g/dL    No results found.   Pertinent items are noted in HPI.  Blood pressure 130/90, pulse 74, temperature 98 F (36.7 C), temperature source Oral, resp. rate 20, height 6\' 3"  (1.905 m), weight 223 lb (101.152 kg), SpO2 96.00%.  General appearance: alert, cooperative and appears stated age Head: Normocephalic, without obvious abnormality Neck: no JVD Resp: clear to auscultation bilaterally Cardio: regular rate and rhythm, S1, S2 normal, no murmur, click, rub or gallop GI: soft, non-tender; bowel sounds normal; no masses,  no organomegaly Extremities: instabilty mcp rt thumb Pulses: 2+ and symmetric Skin: Skin color, texture, turgor normal. No rashes or lesions Neurologic: Grossly normal Incision/Wound: na  Assessment/Plan X-rays reveal minimal degenerative changes. A stress view reveals a marked opening at the MCP joint radial stressing indicative of a completely ulnar collateral ligament injury.   The palpable mass is likely a Stener lesion. We have discussed with him operative intervention of this with repair and/or reconstruction using the abductor pollicis longus. He is aware of the potential for loss of mobility and this may not fully heal. He is aware it may require surgical intervention at a later date due to arthritis. The pre, peri and post op course are discussed along with risks and complications.  He is aware there is no guarantee with surgery, possibility of infection, recurrence, injury to arteries, nerves and tendons, incomplete relief of symptoms and dystrophy. He is advised that he will be in a splint for approximately 6 weeks following this. Questions were invited and answered in detail. He is scheduled for repair reconstruction right thumb MCP joint ulnar collateral ligament injury as an outpatient.  Jaedynn [name] R 09/21/2013, 10:22 AM

## 2013-09-21 NOTE — Progress Notes (Signed)
Assisted Dr. Hodierne with right, ultrasound guided, supraclavicular block. Side rails up, monitors on throughout procedure. See vital signs in flow sheet. Tolerated Procedure well.  

## 2013-09-21 NOTE — Brief Op Note (Signed)
09/21/2013  1:54 PM  PATIENT:  [name]  57 y.o. male  PRE-OPERATIVE DIAGNOSIS:  RUPTURE ULNAR COLLATERAL LIGAMENT METACARPAL PHALANGE RIGH THUMB  POST-OPERATIVE DIAGNOSIS:  Ulnar collateral ligament Tear Right thumb  PROCEDURE:  Procedure(s): ULNAR COLLATERAL LIGAMENT REPAIR METACARPAL PHALANGE RIGHT THUMB POSSIBLE APL GRAFT RECONSTRUCTION (Right)  SURGEON:  Surgeon(s) and Role:    * Nicki Reaper, MD - Primary  PHYSICIAN ASSISTANT:   ASSISTANTS: none   ANESTHESIA:   regional and general  EBL:  Total I/O In: 1000 [I.V.:1000] Out: -   BLOOD ADMINISTERED:none  DRAINS: none   LOCAL MEDICATIONS USED:  NONE  SPECIMEN:  No Specimen  DISPOSITION OF SPECIMEN:  N/A  COUNTS:  YES  TOURNIQUET:   Total Tourniquet Time Documented: Upper Arm (Right) - 61 minutes Total: Upper Arm (Right) - 61 minutes   DICTATION: .Other Dictation: Dictation Number [id number]  PLAN OF CARE: Discharge to home after PACU  PATIENT DISPOSITION:  PACU - hemodynamically stable.

## 2013-09-21 NOTE — Anesthesia Preprocedure Evaluation (Signed)
Anesthesia Evaluation  Patient identified by MRN, date of birth, ID band Patient awake    Reviewed: Allergy & Precautions, H&P , NPO status , Patient's Chart, lab work & pertinent test results  Airway Mallampati: II  Neck ROM: full    Dental   Pulmonary former smoker,          Cardiovascular hypertension,     Neuro/Psych  Neuromuscular disease    GI/Hepatic GERD-  ,  Endo/Other  diabetes, Type 2Hypothyroidism   Renal/GU      Musculoskeletal  (+) Arthritis -,   Abdominal   Peds  Hematology   Anesthesia Other Findings   Reproductive/Obstetrics                           Anesthesia Physical Anesthesia Plan  ASA: II  Anesthesia Plan: General and Regional   Post-op Pain Management: MAC Combined w/ Regional for Post-op pain   Induction: Intravenous  Airway Management Planned: LMA  Additional Equipment:   Intra-op Plan:   Post-operative Plan:   Informed Consent: I have reviewed the patients History and Physical, chart, labs and discussed the procedure including the risks, benefits and alternatives for the proposed anesthesia with the patient or authorized representative who has indicated his/her understanding and acceptance.     Plan Discussed with: CRNA, Anesthesiologist and Surgeon  Anesthesia Plan Comments:         Anesthesia Quick Evaluation

## 2013-09-21 NOTE — Op Note (Signed)
Dictation Number [id number]

## 2013-09-22 ENCOUNTER — Encounter (HOSPITAL_BASED_OUTPATIENT_CLINIC_OR_DEPARTMENT_OTHER): Payer: Self-pay | Admitting: Orthopedic Surgery

## 2013-09-23 NOTE — Op Note (Signed)
[name], [name] NO.:  [name]  MEDICAL RECORD NO.:  [id number]  LOCATION:                                 FACILITY:  PHYSICIAN:  Cindee Salt, M.D.            DATE OF BIRTH:  DATE OF PROCEDURE:  09/21/2013 DATE OF DISCHARGE:                              OPERATIVE REPORT   PREOPERATIVE DIAGNOSIS:  Ruptured ulnar collateral ligament, metacarpophalangeal joint, right thumb.  POSTOPERATIVE DIAGNOSIS:  Ruptured ulnar collateral ligament, metacarpophalangeal joint, right thumb.  OPERATION:  Reattachment ulnar collateral ligament, metacarpophalangeal joint, right thumb.  SURGEON:  Cindee Salt, M.D.  ANESTHESIA:  Supraclavicular block general.  ANESTHESIOLOGIST:  Achille Rich, MD.  HISTORY:  The patient is a 57 year old male, who suffered a fall and injury to the metacarpophalangeal joint of his right thumb.  He did not seek medical attention since two months.  He complains of instability and pain at the joint.  X-rays reveal significant opening to stressing of the ulnar collateral ligament.  He was admitted for repair and reconstruction as dictated by findings.  Pre, peri, and postoperative course have been discussed along with risks and complications.  He is aware that there is no guarantee with the surgery; possibility of infection; recurrence of injury to arteries, nerves, tendons, incomplete relief of symptoms, adequate healing, and possibility reconstruction using abductor pollicis longus tendon.  In the preoperative area, the patient is seen, the extremity was marked by both the patient and surgeon.  Antibiotic given.  PROCEDURE IN DETAIL:  The patient was brought to the operating room where a general anesthetic was carried out without difficulty.  A supraclavicular block has been given in the preoperative area.  He was prepped using ChloraPrep in supine position with the right arm free.  A 3-minute dry time was allowed.  Time-out taken,  confirming the patient and procedure.  The limb was exsanguinated with an Esmarch bandage. Tourniquet was placed high and the arm was inflated to 250 mmHg.  A curvilinear incision was made over the dorsal aspect of the thumb and carried to the ulnar side and then on the proximal phalanx and carried down through subcutaneous tissue.  Bleeders were electrocauterized with bipolar.  Dorsal sensory ulnar nerve was identified and protected. Significant scarring was present about the entire metacarpophalangeal joint with blunt and sharp dissection.  This was slowly relieved revealing the adductor aponeurosis.  The adductor aponeurosis was incised.  The collateral ligament was found to be significantly scarred, but was present having been avulsed off the metacarpal head rather than off from the proximal phalanx.  It was decided that this was adequate enough to attempt to repair this back to the metacarpal head without reconstruction.  This was further debrided.  The origin of the collateral ligament from the metacarpal head was denuded.  Two juggernauts were then inserted and these were 1 mm juggernauts.  This allowed a double repair to the collateral ligament pulling it up against metacarpal head.  This was reinforced with a more distally placed suture through drill holes in the metacarpal in addition.  This firmly fixed the collateral ligament in position with full flexion extension.  There was no instability in either flexion or full extension.  The wound was irrigated prior to closure and repair of the collateral ligament, it was again irrigated afterwards.  The capsule was then repaired with the same 3-0 FiberWire.  The adductor aponeurosis was then repaired again after irrigation with figure-of-eight 4-0 [place] sutures and the skin with interrupted 4-0 Vicryl Rapide sutures.  A sterile compressive dressing was applied including a thumb spica splint dorsal palmar in nature.  On deflation  of the tourniquet, all fingers immediately pinked.  He was taken to the recovery room for observation in satisfactory condition. He will be discharged home to return in 1 week on Percocet.          ______________________________ Cindee Salt, M.D.     GK/MEDQ  D:  09/21/2013  T:  09/22/2013  Job:  [id number]

## 2013-10-01 ENCOUNTER — Other Ambulatory Visit: Payer: Self-pay | Admitting: Internal Medicine

## 2013-11-11 ENCOUNTER — Other Ambulatory Visit: Payer: Self-pay | Admitting: Internal Medicine

## 2013-11-12 ENCOUNTER — Other Ambulatory Visit: Payer: Self-pay | Admitting: Internal Medicine

## 2013-12-25 ENCOUNTER — Other Ambulatory Visit: Payer: Self-pay | Admitting: Internal Medicine

## 2013-12-29 ENCOUNTER — Telehealth: Payer: Self-pay

## 2013-12-29 NOTE — Telephone Encounter (Signed)
Prior authorization coverage for Cialis has been denied by patient's insurance.

## 2014-02-06 ENCOUNTER — Other Ambulatory Visit: Payer: Self-pay | Admitting: Internal Medicine

## 2014-02-06 NOTE — Telephone Encounter (Signed)
Would you mind refilling for a month or so and we can put a note that patient needs to establish new pcp

## 2014-03-19 DIAGNOSIS — S0291XA Unspecified fracture of skull, initial encounter for closed fracture: Secondary | ICD-10-CM | POA: Insufficient documentation

## 2014-03-19 DIAGNOSIS — S06309A Unspecified focal traumatic brain injury with loss of consciousness of unspecified duration, initial encounter: Secondary | ICD-10-CM

## 2014-03-20 DIAGNOSIS — W19XXXA Unspecified fall, initial encounter: Secondary | ICD-10-CM | POA: Insufficient documentation

## 2014-03-20 DIAGNOSIS — S069X9A Unspecified intracranial injury with loss of consciousness of unspecified duration, initial encounter: Secondary | ICD-10-CM | POA: Insufficient documentation

## 2014-03-20 DIAGNOSIS — R780 Finding of alcohol in blood: Secondary | ICD-10-CM | POA: Insufficient documentation

## 2014-03-20 DIAGNOSIS — S069XAA Unspecified intracranial injury with loss of consciousness status unknown, initial encounter: Secondary | ICD-10-CM | POA: Insufficient documentation

## 2014-03-25 ENCOUNTER — Emergency Department (HOSPITAL_COMMUNITY): Payer: BC Managed Care – PPO

## 2014-03-25 ENCOUNTER — Encounter (HOSPITAL_COMMUNITY): Payer: Self-pay | Admitting: Emergency Medicine

## 2014-03-25 ENCOUNTER — Inpatient Hospital Stay (HOSPITAL_COMMUNITY)
Admission: EM | Admit: 2014-03-25 | Discharge: 2014-03-27 | DRG: 087 | Disposition: A | Discharge status: Home / Self Care | Payer: BC Managed Care – PPO | Attending: Internal Medicine | Admitting: Internal Medicine

## 2014-03-25 DIAGNOSIS — G44309 Post-traumatic headache, unspecified, not intractable: Secondary | ICD-10-CM | POA: Diagnosis present

## 2014-03-25 DIAGNOSIS — E119 Type 2 diabetes mellitus without complications: Secondary | ICD-10-CM | POA: Diagnosis present

## 2014-03-25 DIAGNOSIS — W010XXA Fall on same level from slipping, tripping and stumbling without subsequent striking against object, initial encounter: Secondary | ICD-10-CM | POA: Diagnosis present

## 2014-03-25 DIAGNOSIS — R51 Headache: Secondary | ICD-10-CM

## 2014-03-25 DIAGNOSIS — I6200 Nontraumatic subdural hemorrhage, unspecified: Secondary | ICD-10-CM

## 2014-03-25 DIAGNOSIS — F1010 Alcohol abuse, uncomplicated: Secondary | ICD-10-CM | POA: Diagnosis present

## 2014-03-25 DIAGNOSIS — S066XAA Traumatic subarachnoid hemorrhage with loss of consciousness status unknown, initial encounter: Secondary | ICD-10-CM

## 2014-03-25 DIAGNOSIS — S066X9A Traumatic subarachnoid hemorrhage with loss of consciousness of unspecified duration, initial encounter: Secondary | ICD-10-CM

## 2014-03-25 DIAGNOSIS — S02109A Fracture of base of skull, unspecified side, initial encounter for closed fracture: Principal | ICD-10-CM | POA: Diagnosis present

## 2014-03-25 DIAGNOSIS — I10 Essential (primary) hypertension: Secondary | ICD-10-CM | POA: Diagnosis present

## 2014-03-25 DIAGNOSIS — R519 Headache, unspecified: Secondary | ICD-10-CM | POA: Diagnosis present

## 2014-03-25 DIAGNOSIS — E785 Hyperlipidemia, unspecified: Secondary | ICD-10-CM | POA: Diagnosis present

## 2014-03-25 DIAGNOSIS — M1990 Unspecified osteoarthritis, unspecified site: Secondary | ICD-10-CM | POA: Diagnosis present

## 2014-03-25 DIAGNOSIS — Z8249 Family history of ischemic heart disease and other diseases of the circulatory system: Secondary | ICD-10-CM

## 2014-03-25 DIAGNOSIS — Z8673 Personal history of transient ischemic attack (TIA), and cerebral infarction without residual deficits: Secondary | ICD-10-CM

## 2014-03-25 DIAGNOSIS — Z801 Family history of malignant neoplasm of trachea, bronchus and lung: Secondary | ICD-10-CM

## 2014-03-25 DIAGNOSIS — R7402 Elevation of levels of lactic acid dehydrogenase (LDH): Secondary | ICD-10-CM | POA: Diagnosis present

## 2014-03-25 DIAGNOSIS — R740 Nonspecific elevation of levels of transaminase and lactic acid dehydrogenase [LDH]: Secondary | ICD-10-CM

## 2014-03-25 DIAGNOSIS — K219 Gastro-esophageal reflux disease without esophagitis: Secondary | ICD-10-CM | POA: Diagnosis present

## 2014-03-25 DIAGNOSIS — G960 Cerebrospinal fluid leak: Secondary | ICD-10-CM

## 2014-03-25 DIAGNOSIS — E039 Hypothyroidism, unspecified: Secondary | ICD-10-CM | POA: Diagnosis present

## 2014-03-25 DIAGNOSIS — Z87891 Personal history of nicotine dependence: Secondary | ICD-10-CM

## 2014-03-25 DIAGNOSIS — S06300A Unspecified focal traumatic brain injury without loss of consciousness, initial encounter: Principal | ICD-10-CM

## 2014-03-25 DIAGNOSIS — R7401 Elevation of levels of liver transaminase levels: Secondary | ICD-10-CM | POA: Diagnosis present

## 2014-03-25 DIAGNOSIS — G9608 Other cranial cerebrospinal fluid leak: Secondary | ICD-10-CM | POA: Diagnosis present

## 2014-03-25 LAB — CBC
HCT: 34.9 % — ABNORMAL LOW (ref 39.0–52.0)
Hemoglobin: 11.9 g/dL — ABNORMAL LOW (ref 13.0–17.0)
MCH: 32 pg (ref 26.0–34.0)
MCHC: 34.1 g/dL (ref 30.0–36.0)
MCV: 93.8 fL (ref 78.0–100.0)
Platelets: 100 10*3/uL — ABNORMAL LOW (ref 150–400)
RBC: 3.72 MIL/uL — ABNORMAL LOW (ref 4.22–5.81)
RDW: 14.1 % (ref 11.5–15.5)
WBC: 5.4 10*3/uL (ref 4.0–10.5)

## 2014-03-25 LAB — I-STAT TROPONIN, ED: Troponin i, poc: 0 ng/mL (ref 0.00–0.08)

## 2014-03-25 LAB — PROTIME-INR
INR: 0.88 (ref 0.00–1.49)
Prothrombin Time: 11.8 seconds (ref 11.6–15.2)

## 2014-03-25 LAB — COMPREHENSIVE METABOLIC PANEL
ALT: 208 U/L — ABNORMAL HIGH (ref 0–53)
AST: 157 U/L — ABNORMAL HIGH (ref 0–37)
Albumin: 3.4 g/dL — ABNORMAL LOW (ref 3.5–5.2)
Alkaline Phosphatase: 101 U/L (ref 39–117)
BUN: 12 mg/dL (ref 6–23)
CO2: 24 mEq/L (ref 19–32)
Calcium: 9.1 mg/dL (ref 8.4–10.5)
Chloride: 101 mEq/L (ref 96–112)
Creatinine, Ser: 0.6 mg/dL (ref 0.50–1.35)
GFR calc Af Amer: >90 mL/min (ref >90)
GFR calc non Af Amer: >90 mL/min (ref >90)
Glucose, Bld: 97 mg/dL (ref 70–99)
Potassium: 3.9 mEq/L (ref 3.7–5.3)
Sodium: 140 mEq/L (ref 137–147)
Total Bilirubin: 0.4 mg/dL (ref 0.3–1.2)
Total Protein: 6.8 g/dL (ref 6.0–8.3)

## 2014-03-25 LAB — I-STAT CHEM 8, ED
BUN: 11 mg/dL (ref 6–23)
Calcium, Ion: 1.15 mmol/L (ref 1.12–1.23)
Chloride: 100 mEq/L (ref 96–112)
Creatinine, Ser: 0.6 mg/dL (ref 0.50–1.35)
Glucose, Bld: 98 mg/dL (ref 70–99)
HCT: 35 % — ABNORMAL LOW (ref 39.0–52.0)
Hemoglobin: 11.9 g/dL — ABNORMAL LOW (ref 13.0–17.0)
Potassium: 3.6 mEq/L — ABNORMAL LOW (ref 3.7–5.3)
Sodium: 139 mEq/L (ref 137–147)
TCO2: 24 mmol/L (ref 0–100)

## 2014-03-25 LAB — TSH: TSH: 1.98 u[IU]/mL (ref 0.350–4.500)

## 2014-03-25 LAB — DIFFERENTIAL
Basophils Absolute: 0 10*3/uL (ref 0.0–0.1)
Basophils Relative: 0 % (ref 0–1)
Eosinophils Absolute: 0.1 10*3/uL (ref 0.0–0.7)
Eosinophils Relative: 2 % (ref 0–5)
Lymphocytes Relative: 35 % (ref 12–46)
Lymphs Abs: 1.9 10*3/uL (ref 0.7–4.0)
Monocytes Absolute: 1.1 10*3/uL — ABNORMAL HIGH (ref 0.1–1.0)
Monocytes Relative: 20 % — ABNORMAL HIGH (ref 3–12)
Neutro Abs: 2.3 10*3/uL (ref 1.7–7.7)
Neutrophils Relative %: 43 % (ref 43–77)

## 2014-03-25 LAB — ETHANOL: Alcohol, Ethyl (B): <11 mg/dL (ref 0–11)

## 2014-03-25 LAB — APTT: aPTT: 26 seconds (ref 24–37)

## 2014-03-25 LAB — CBG MONITORING, ED: Glucose-Capillary: 90 mg/dL (ref 70–99)

## 2014-03-25 LAB — MRSA PCR SCREENING: MRSA by PCR: NEGATIVE

## 2014-03-25 MED ORDER — AMLODIPINE BESYLATE 10 MG PO TABS
10.0000 mg | ORAL_TABLET | Freq: Every day | ORAL | Status: DC
  Administered 2014-03-25 – 2014-03-27 (×3): 10 mg via ORAL
  Filled 2014-03-25 (×3): qty 1

## 2014-03-25 MED ORDER — LEVETIRACETAM 500 MG PO TABS
1000.0000 mg | ORAL_TABLET | Freq: Two times a day (BID) | ORAL | Status: DC
  Administered 2014-03-25 – 2014-03-27 (×5): 1000 mg via ORAL
  Filled 2014-03-25 (×6): qty 2

## 2014-03-25 MED ORDER — SODIUM CHLORIDE 0.9 % IJ SOLN
3.0000 mL | Freq: Two times a day (BID) | INTRAMUSCULAR | Status: DC
  Administered 2014-03-25 – 2014-03-27 (×5): 3 mL via INTRAVENOUS

## 2014-03-25 MED ORDER — ENALAPRIL MALEATE 10 MG PO TABS
10.0000 mg | ORAL_TABLET | Freq: Every day | ORAL | Status: DC
  Administered 2014-03-25 – 2014-03-27 (×3): 10 mg via ORAL
  Filled 2014-03-25 (×3): qty 1

## 2014-03-25 MED ORDER — ONDANSETRON HCL 4 MG/2ML IJ SOLN
4.0000 mg | Freq: Four times a day (QID) | INTRAMUSCULAR | Status: DC | PRN
  Administered 2014-03-25 – 2014-03-26 (×2): 4 mg via INTRAVENOUS
  Filled 2014-03-25 (×2): qty 2

## 2014-03-25 MED ORDER — HYDROCODONE-ACETAMINOPHEN 5-325 MG PO TABS
1.0000 | ORAL_TABLET | Freq: Four times a day (QID) | ORAL | Status: DC | PRN
  Administered 2014-03-25: 1 via ORAL
  Administered 2014-03-25 – 2014-03-26 (×3): 2 via ORAL
  Filled 2014-03-25 (×2): qty 2
  Filled 2014-03-25: qty 1
  Filled 2014-03-25 (×2): qty 2

## 2014-03-25 MED ORDER — SODIUM CHLORIDE 0.9 % IV SOLN: INTRAVENOUS | Status: DC

## 2014-03-25 MED ORDER — TRIAZOLAM 0.25 MG PO TABS: 0.2500 mg | ORAL_TABLET | Freq: Every evening | ORAL | Status: DC | PRN

## 2014-03-25 MED ORDER — PANTOPRAZOLE SODIUM 40 MG PO TBEC
40.0000 mg | DELAYED_RELEASE_TABLET | Freq: Every day | ORAL | Status: DC
  Administered 2014-03-25 – 2014-03-27 (×3): 40 mg via ORAL
  Filled 2014-03-25 (×3): qty 1

## 2014-03-25 MED ORDER — ONDANSETRON HCL 4 MG/2ML IJ SOLN
4.0000 mg | Freq: Once | INTRAMUSCULAR | Status: AC
  Administered 2014-03-25: 4 mg via INTRAVENOUS
  Filled 2014-03-25: qty 2

## 2014-03-25 MED ORDER — MORPHINE SULFATE 2 MG/ML IJ SOLN
1.0000 mg | INTRAMUSCULAR | Status: DC | PRN
  Administered 2014-03-25 (×3): 2 mg via INTRAVENOUS
  Administered 2014-03-25: 1 mg via INTRAVENOUS
  Administered 2014-03-26 (×3): 2 mg via INTRAVENOUS
  Filled 2014-03-25 (×7): qty 1

## 2014-03-25 MED ORDER — ONDANSETRON HCL 4 MG PO TABS
4.0000 mg | ORAL_TABLET | Freq: Four times a day (QID) | ORAL | Status: DC | PRN
  Administered 2014-03-26: 4 mg via ORAL
  Filled 2014-03-25: qty 1

## 2014-03-25 MED ORDER — MORPHINE SULFATE 4 MG/ML IJ SOLN
4.0000 mg | Freq: Once | INTRAMUSCULAR | Status: AC
  Administered 2014-03-25: 4 mg via INTRAVENOUS
  Filled 2014-03-25: qty 1

## 2014-03-25 MED ORDER — ACETAMINOPHEN 325 MG PO TABS
650.0000 mg | ORAL_TABLET | Freq: Four times a day (QID) | ORAL | Status: DC | PRN
  Administered 2014-03-26 (×2): 650 mg via ORAL
  Filled 2014-03-25 (×3): qty 2

## 2014-03-25 NOTE — ED Notes (Signed)
Per EMS: pt from home with c/o headache.  Pt recently had a head bleed and a skull fracture due to a fall a week ago.  Pt was treated at [facility] in [place].  Pt was watching tv at 2 am when a sudden headache came on.  Pt presenting with a facial droop.

## 2014-03-25 NOTE — ED Notes (Signed)
Hospitalist at bedside 

## 2014-03-25 NOTE — ED Notes (Signed)
Dr Otter at bedside  

## 2014-03-25 NOTE — Consult Note (Signed)
Reason for Consult: Severe headache with history of recent head trauma with intracranial hemorrhage.  HPI:                                                                                                                                          [name] [name] is an 58 y.o. male with a history of hypertension, hyperlipidemia, and GERD and diabetes mellitus who presented to the emergency room and code stroke status with severe headache and mild facial droop. Patient sustained head trauma about 10 days ago and reportedly had subdural as well as subarachnoid intracranial hemorrhage. He was then [place] at that time and was treated at [facility]. Patient reportedly was intoxicated at the time of head trauma with an alcohol level greater than 400. CT scan tonight shows probable acute right tentorial subdural hematoma as well as possible acute left frontal and left parietal occipital subarachnoid hemorrhage. Left encephalomalacia was noted comment comparison to previous CT scan done here on 12/10/2012. Bilateral subdural hygromas also noted as well as a nondisplaced right occipital fracture. NIH stroke score was 1 for slurred speech. Alcohol level and urine drug screen pending. Patient had no focal deficits. Code stroke was canceled.  Past Medical History  Diagnosis Date  . Hypertension   . GERD (gastroesophageal reflux disease)   . Hyperlipidemia   . 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 Hyperlipidemia   . Diabetes mellitus     type 2-off all meds now after wt loss    Past Surgical History  Procedure Laterality Date  . Arthroscopy x 1 left (rendall)      X 2 right  . Ganglion cyst excision      right wrist (kuzma)  . Correction of deviated septum    . Tonsillectomy    . Pilonidal cyst / sinus excision  58 yrs old  . Gastric banding port revision  8/11  . Ulnar collateral ligament repair Right 09/21/2013    Procedure: ULNAR COLLATERAL LIGAMENT REPAIR METACARPAL PHALANGE RIGHT  THUMB POSSIBLE APL GRAFT RECONSTRUCTION;  Surgeon: Wynonia Sours, MD;  Location: [place];  Service: Orthopedics;  Laterality: Right;    Family History  Problem Relation Age of Onset  . Cancer Father     lung cancer metatstatic cancer to brain  . Coronary artery disease Father   . Heart attack Father     X 2 (41,45)  . Heart disease Father     CAD/MI x 2  . Cancer Mother     rapid on set  . Hypertension Mother   . Diabetes Neg Hx   . Parkinsonism Maternal Grandfather     Social History:  reports that he quit smoking about 18 years ago. He has never used smokeless tobacco. He reports that he drinks about 1.8 ounces of alcohol per week. He reports that he does not  use illicit drugs.  Allergies  Allergen Reactions  . Penicillins Anaphylaxis  . Codeine Nausea And Vomiting    MEDICATIONS:                                                                                                                     I have reviewed the patient's current medications.   ROS:                                                                                                                                       History obtained from spouse and the patient  General ROS: negative for - chills, fatigue, fever, night sweats, weight gain or weight loss Psychological ROS: negative for - behavioral disorder, hallucinations, memory difficulties, mood swings or suicidal ideation Ophthalmic ROS: negative for - blurry vision, double vision, eye pain or loss of vision ENT ROS: negative for - epistaxis, nasal discharge, oral lesions, sore throat, tinnitus or vertigo Allergy and Immunology ROS: negative for - hives or itchy/watery eyes Hematological and Lymphatic ROS: negative for - bleeding problems, bruising or swollen lymph nodes Endocrine ROS: negative for - galactorrhea, hair pattern changes, polydipsia/polyuria or temperature intolerance Respiratory ROS: negative for - cough, hemoptysis,  shortness of breath or wheezing Cardiovascular ROS: negative for - chest pain, dyspnea on exertion, edema or irregular heartbeat Gastrointestinal ROS: negative for - abdominal pain, diarrhea, hematemesis, nausea/vomiting or stool incontinence Genito-Urinary ROS: negative for - dysuria, hematuria, incontinence or urinary frequency/urgency Musculoskeletal ROS: negative for - joint swelling or muscular weakness Neurological ROS: as noted in HPI Dermatological ROS: negative for rash and skin lesion changes   Blood pressure 176/98, pulse 59, resp. rate 11, SpO2 93.00%.   Neurologic Examination:                                                                                                      Mental Status: Minimally lethargic, oriented, thought content appropriate.  Speech slightly slurred without evidence of aphasia. Able to follow  commands without difficulty. Cranial Nerves: II-Visual fields were normal. III/IV/VI-Pupils were equal and reacted. Extraocular movements were full and conjugate.    V/VII-no facial numbness and no facial weakness. VIII-normal. X-normal speech. Motor: 5/5 bilaterally with normal tone and bulk Sensory: Normal throughout. Deep Tendon Reflexes: 1+ and symmetric. Cerebellar: Normal finger-to-nose testing.  Lab Results  Component Value Date/Time   CHOL 166 12/10/2012  7:31 AM    Results for orders placed during the hospital encounter of 03/25/14 (from the past 48 hour(s))  CBG MONITORING, ED     Status: None   Collection Time    03/25/14  2:46 AM      Result Value Ref Range   Glucose-Capillary 90  70 - 99 mg/dL  PROTIME-INR     Status: None   Collection Time    03/25/14  2:49 AM      Result Value Ref Range   Prothrombin Time 11.8  11.6 - 15.2 seconds   INR 0.88  0.00 - 1.49  APTT     Status: None   Collection Time    03/25/14  2:49 AM      Result Value Ref Range   aPTT 26  24 - 37 seconds  CBC     Status: Abnormal (Preliminary result)   Collection  Time    03/25/14  2:49 AM      Result Value Ref Range   WBC 5.4  4.0 - 10.5 K/uL   RBC 3.72 (*) 4.22 - 5.81 MIL/uL   Hemoglobin 11.9 (*) 13.0 - 17.0 g/dL   HCT 34.9 (*) 39.0 - 52.0 %   MCV 93.8  78.0 - 100.0 fL   MCH 32.0  26.0 - 34.0 pg   MCHC 34.1  30.0 - 36.0 g/dL   RDW 14.1  11.5 - 15.5 %   Platelets PENDING  150 - 400 K/uL  DIFFERENTIAL     Status: None   Collection Time    03/25/14  2:49 AM      Result Value Ref Range   Neutrophils Relative % PENDING  43 - 77 %   Neutro Abs PENDING  1.7 - 7.7 K/uL   Band Neutrophils PENDING  0 - 10 %   Lymphocytes Relative PENDING  12 - 46 %   Lymphs Abs PENDING  0.7 - 4.0 K/uL   Monocytes Relative PENDING  3 - 12 %   Monocytes Absolute PENDING  0.1 - 1.0 K/uL   Eosinophils Relative PENDING  0 - 5 %   Eosinophils Absolute PENDING  0.0 - 0.7 K/uL   Basophils Relative PENDING  0 - 1 %   Basophils Absolute PENDING  0.0 - 0.1 K/uL   WBC Morphology PENDING     RBC Morphology PENDING     Smear Review PENDING     nRBC PENDING  0 /100 WBC   Metamyelocytes Relative PENDING     Myelocytes PENDING     Promyelocytes Absolute PENDING     Blasts PENDING    COMPREHENSIVE METABOLIC PANEL     Status: Abnormal   Collection Time    03/25/14  2:49 AM      Result Value Ref Range   Sodium 140  137 - 147 mEq/L   Potassium 3.9  3.7 - 5.3 mEq/L   Chloride 101  96 - 112 mEq/L   CO2 24  19 - 32 mEq/L   Glucose, Bld 97  70 - 99 mg/dL   BUN 12  6 - 23 mg/dL  Creatinine, Ser 0.60  0.50 - 1.35 mg/dL   Calcium 9.1  8.4 - 10.5 mg/dL   Total Protein 6.8  6.0 - 8.3 g/dL   Albumin 3.4 (*) 3.5 - 5.2 g/dL   AST 157 (*) 0 - 37 U/L   ALT 208 (*) 0 - 53 U/L   Alkaline Phosphatase 101  39 - 117 U/L   Total Bilirubin 0.4  0.3 - 1.2 mg/dL   GFR calc non Af Amer >90  >90 mL/min   GFR calc Af Amer >90  >90 mL/min   Comment: (NOTE)     The eGFR has been calculated using the CKD EPI equation.     This calculation has not been validated in all clinical situations.      eGFR's persistently <90 mL/min signify possible Chronic Kidney     Disease.  Randolm Idol, ED     Status: None   Collection Time    03/25/14  2:52 AM      Result Value Ref Range   Troponin i, poc 0.00  0.00 - 0.08 ng/mL   Comment 3            Comment: Due to the release kinetics of cTnI,     a negative result within the first hours     of the onset of symptoms does not rule out     myocardial infarction with certainty.     If myocardial infarction is still suspected,     repeat the test at appropriate intervals.  I-STAT CHEM 8, ED     Status: Abnormal   Collection Time    03/25/14  2:55 AM      Result Value Ref Range   Sodium 139  137 - 147 mEq/L   Potassium 3.6 (*) 3.7 - 5.3 mEq/L   Chloride 100  96 - 112 mEq/L   BUN 11  6 - 23 mg/dL   Creatinine, Ser 0.60  0.50 - 1.35 mg/dL   Glucose, Bld 98  70 - 99 mg/dL   Calcium, Ion 1.15  1.12 - 1.23 mmol/L   TCO2 24  0 - 100 mmol/L   Hemoglobin 11.9 (*) 13.0 - 17.0 g/dL   HCT 35.0 (*) 39.0 - 52.0 %    Ct Head (brain) Wo Contrast  03/25/2014   CLINICAL DATA:  Acute onset of severe headache 1 hr ago. Skull fracture with subdural, subarachnoid and parenchymal hemorrhage in [place], [place] 10 days ago.  EXAM: CT HEAD WITHOUT CONTRAST  TECHNIQUE: Contiguous axial images were obtained from the base of the skull through the vertex without intravenous contrast.  COMPARISON:  12/10/2012.  FINDINGS: Right tentorial subdural hematoma. This appears acute. There is also a small amount of high density subarachnoid hemorrhage in the left frontal region which also appears acute. Small area of high density hemorrhage in the left parietal-occipital subarachnoid space. Also noted is white matter low density in the left corona region which was not previously present. There are also interval low density subdural fluid collections, measuring 12 mm in maximum thickness on the left and 10 mm in maximum thickness on the right. Normal size and position of the  ventricles. Nondisplaced right occipital skull fracture with a small overlying scalp hematoma. No intraventricular hemorrhage is seen. Normally pneumatized paranasal sinuses.  IMPRESSION: 1. Probable acute right tentorial subdural hematoma. 2. Probable acute left frontal and left parietal occipital subarachnoid hemorrhage. 3. Interval left frontal encephalomalacia. 4. Interval bilateral subdural hygromas. 5. Nondisplaced right occipital bone fracture.  These results were called by telephone at the time of interpretation on 03/25/2014 at 3:09 AM to Dr. Nicole Kindred, who verbally acknowledged these results.   Electronically Signed   By: Enrique Sack M.D.   On: 03/25/2014 03:13    Assessment/Plan: 58 year old man with recent head trauma and intracranial hemorrhage presenting with what appears to be acute subarachnoid hemorrhage involving right tentorial region as well as left frontal and parietal regions. Patient denies recurrent trauma.  Recommendations: 1. Admission for observation. 2. Repeat CT scan of the head 12-24 hours to rule out extension of acute subarachnoid hemorrhage. 3. Alcohol withdrawal precautions. 4. Management of headache with analgesic medication as needed.  We will continue following this patient with you.  C.R. Nicole Kindred, MD [facility] [phone]  03/25/2014, 3:24 AM

## 2014-03-25 NOTE — H&P (Signed)
[facility] History and Physical  NASSER KU YQM:[id number] DOB: 12-23-1955 DOA: 03/25/2014  Referring physician: Sharol Given PCP: Adella Hare, MD   Chief Complaint: Headache  HPI: [name] [name] is a 58 y.o. male with past medical history of hypertension came into the hospital with severe headache. Patient was discharged last Monday from [facility] in [place], [place] after he spent 3 days for subarachnoid hemorrhage. Patient fell on Saturday 5/9 night while he was drunk and had SAH and depressed skull fracture. He was discharged from the hospital without intervention, came back to [facility] today because of severe headache. Patient denies any type of seizure-like activity, denies any lethargy, confusion, weakness or slurred speech. In the ED patient CT showed SDH, SAH and nondisplaced right occipital bone fracture. Neurology and neurosurgery consulted, they both recommended to watch patient overnight and repeat CT in and. Patient headache resolved after morphine administration in the ED.   Review of Systems:  Constitutional: negative for anorexia, fevers and sweats Eyes: negative for irritation, redness and visual disturbance Ears, nose, mouth, throat, and face: negative for earaches, epistaxis, nasal congestion and sore throat Respiratory: negative for cough, dyspnea on exertion, sputum and wheezing Cardiovascular: negative for chest pain, dyspnea, lower extremity edema, orthopnea, palpitations and syncope Gastrointestinal: negative for abdominal pain, constipation, diarrhea, melena, nausea and vomiting Genitourinary:negative for dysuria, frequency and hematuria Hematologic/lymphatic: negative for bleeding, easy bruising and lymphadenopathy Musculoskeletal:negative for arthralgias, muscle weakness and stiff joints Neurological: negative for coordination problems, gait problems, headaches and weakness Endocrine: negative for diabetic symptoms including  polydipsia, polyuria and weight loss Allergic/Immunologic: negative for anaphylaxis, hay fever and urticaria  Past Medical History  Diagnosis Date  . Hypertension   . GERD (gastroesophageal reflux disease)   . Hyperlipidemia   . 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 Hyperlipidemia   . Diabetes mellitus     type 2-off all meds now after wt loss   Past Surgical History  Procedure Laterality Date  . Arthroscopy x 1 left (rendall)      X 2 right  . Ganglion cyst excision      right wrist (kuzma)  . Correction of deviated septum    . Tonsillectomy    . Pilonidal cyst / sinus excision  58 yrs old  . Gastric banding port revision  8/11  . Ulnar collateral ligament repair Right 09/21/2013    Procedure: ULNAR COLLATERAL LIGAMENT REPAIR METACARPAL PHALANGE RIGHT THUMB POSSIBLE APL GRAFT RECONSTRUCTION;  Surgeon: Wynonia Sours, MD;  Location: [facility];  Service: Orthopedics;  Laterality: Right;   Social History:   reports that he quit smoking about 18 years ago. He has never used smokeless tobacco. He reports that he drinks about 1.8 ounces of alcohol per week. He reports that he does not use illicit drugs.  Allergies  Allergen Reactions  . Penicillins Anaphylaxis  . Codeine Nausea And Vomiting    Family History  Problem Relation Age of Onset  . Cancer Father     lung cancer metatstatic cancer to brain  . Coronary artery disease Father   . Heart attack Father     X 2 (41,45)  . Heart disease Father     CAD/MI x 2  . Cancer Mother     rapid on set  . Hypertension Mother   . Diabetes Neg Hx   . Parkinsonism Maternal Grandfather      Prior to Admission medications   Medication Sig Start Date  End Date Taking? Authorizing Provider  amLODipine (NORVASC) 10 MG tablet Take 10 mg by mouth daily.   Yes Historical Provider, MD  aspirin 81 MG tablet Take 81 mg by mouth daily.     Yes Historical Provider, MD  butalbital-acetaminophen-caffeine (FIORICET WITH  CODEINE) 50-325-40-30 MG per capsule Take 1-2 capsules by mouth every 6 (six) hours as needed for headache.   Yes Historical Provider, MD  enalapril (VASOTEC) 10 MG tablet Take 10 mg by mouth daily.   Yes Historical Provider, MD  levETIRAcetam (KEPPRA) 1000 MG tablet Take 1,000 mg by mouth 2 (two) times daily.   Yes Historical Provider, MD  loratadine-pseudoephedrine (CLARITIN-D 24-HOUR) 10-240 MG per 24 hr tablet Take 1 tablet by mouth daily as needed for allergies.   Yes Historical Provider, MD  Multiple Vitamins-Minerals (ONE-A-DAY EXTRAS ANTIOXIDANT) CAPS Take 1 capsule by mouth daily.     Yes Historical Provider, MD  pantoprazole (PROTONIX) 40 MG tablet Take 40 mg by mouth daily.   Yes Historical Provider, MD  tadalafil (CIALIS) 20 MG tablet Take 20 mg by mouth daily as needed for erectile dysfunction.   Yes Historical Provider, MD  triazolam (HALCION) 0.25 MG tablet Take 0.25 mg by mouth at bedtime as needed for sleep.   Yes Historical Provider, MD   Physical Exam: Filed Vitals:   03/25/14 0830  BP: 142/89  Pulse: 68  Resp: 15   Constitutional: Oriented to person, place, and time. Well-developed and well-nourished. Cooperative.  Head: Normocephalic and atraumatic.  Nose: Nose normal.  Mouth/Throat: Uvula is midline, oropharynx is clear and moist and mucous membranes are normal.  Eyes: Conjunctivae and EOM are normal. Pupils are equal, round, and reactive to light.  Neck: Trachea normal and normal range of motion. Neck supple.  Cardiovascular: Normal rate, regular rhythm, S1 normal, S2 normal, normal heart sounds and intact distal pulses.   Pulmonary/Chest: Effort normal and breath sounds normal.  Abdominal: Soft. Bowel sounds are normal. There is no hepatosplenomegaly. There is no tenderness.  Musculoskeletal: Normal range of motion.  Neurological: Alert and oriented to person, place, and time. He has normal strength. No cranial nerve deficit or sensory deficit.  Skin: Skin is warm,  dry and intact.  Psychiatric: Has a normal mood and affect. Speech is normal and behavior is normal.   Labs on Admission:  Basic Metabolic Panel:  Recent Labs Lab 03/25/14 0249 03/25/14 0255  NA 140 139  K 3.9 3.6*  CL 101 100  CO2 24  --   GLUCOSE 97 98  BUN 12 11  CREATININE 0.60 0.60  CALCIUM 9.1  --    Liver Function Tests:  Recent Labs Lab 03/25/14 0249  AST 157*  ALT 208*  ALKPHOS 101  BILITOT 0.4  PROT 6.8  ALBUMIN 3.4*   No results found for this basename: LIPASE, AMYLASE,  in the last 168 hours No results found for this basename: AMMONIA,  in the last 168 hours CBC:  Recent Labs Lab 03/25/14 0249 03/25/14 0255  WBC 5.4  --   NEUTROABS 2.3  --   HGB 11.9* 11.9*  HCT 34.9* 35.0*  MCV 93.8  --   PLT 100*  --    Cardiac Enzymes: No results found for this basename: CKTOTAL, CKMB, CKMBINDEX, TROPONINI,  in the last 168 hours  BNP (last 3 results) No results found for this basename: PROBNP,  in the last 8760 hours CBG:  Recent Labs Lab 03/25/14 0246  GLUCAP 90    Radiological Exams  on Admission: Ct Head (brain) Wo Contrast  03/25/2014   CLINICAL DATA:  Acute onset of severe headache 1 hr ago. Skull fracture with subdural, subarachnoid and parenchymal hemorrhage in [place], [place] 10 days ago.  EXAM: CT HEAD WITHOUT CONTRAST  TECHNIQUE: Contiguous axial images were obtained from the base of the skull through the vertex without intravenous contrast.  COMPARISON:  12/10/2012.  FINDINGS: Right tentorial subdural hematoma. This appears acute. There is also a small amount of high density subarachnoid hemorrhage in the left frontal region which also appears acute. Small area of high density hemorrhage in the left parietal-occipital subarachnoid space. Also noted is white matter low density in the left corona region which was not previously present. There are also interval low density subdural fluid collections, measuring 12 mm in maximum thickness on the left  and 10 mm in maximum thickness on the right. Normal size and position of the ventricles. Nondisplaced right occipital skull fracture with a small overlying scalp hematoma. No intraventricular hemorrhage is seen. Normally pneumatized paranasal sinuses.  IMPRESSION: 1. Probable acute right tentorial subdural hematoma. 2. Probable acute left frontal and left parietal occipital subarachnoid hemorrhage. 3. Interval left frontal encephalomalacia. 4. Interval bilateral subdural hygromas. 5. Nondisplaced right occipital bone fracture. These results were called by telephone at the time of interpretation on 03/25/2014 at 3:09 AM to Dr. Nicole Kindred, who verbally acknowledged these results.   Electronically Signed   By: Enrique Sack M.D.   On: 03/25/2014 03:13    EKG: Independently reviewed.   Assessment/Plan Active Problems:   Subarachnoid hemorrhage following injury   Subdural hygroma   Headache   Post-traumatic headache    Headache -Likely secondary to the recent head trauma. -Patient has occipital bone fracture, SDH and SAH. -Denies any seizure-like activity, denies any weakness or other neurological symptoms. -Per neurology, control headache with Tylenol and narcotics.  SAH/SDH -Secondary to recent head trauma, patient slipped while he was drunk and fell backwards. -Patient ended up with occipital bone fracture, SAH and SDH. -Scan was reviewed by Dr. Vertell Limber of neurosurgery and Dr. Nicole Kindred of neurology, both recommended observation. -Repeat CT scan in a.m. Avoid aspirin, NSAIDs and heparin products.  Hypertension -Restart home medications.  Hypothyroidism -Per records, check TSH. patient not on any medication.  Code Status:  full code  Family Communication:  plan discussed with the patient  Disposition Plan:  stepdown   Time spent: 70 minutes  [facility] Pager [phone]

## 2014-03-25 NOTE — ED Provider Notes (Signed)
CSN: [id number]     Arrival date & time 03/25/14  0244 History   First MD Initiated Contact with Patient 03/25/14 0248     Chief Complaint  Patient presents with  . Code Stroke     (Consider location/radiation/quality/duration/timing/severity/associated sxs/prior Treatment) HPI 58 year old male presents to emergency department from home via EMS with complaint of severe frontal headache.  The patient initially called out as a code stroke due to severe headache and concern for possible facial droop.  Patient had a fall on May 9, fall from standing striking the back of his head.  Patient was intoxicated at that time.  Patient had workup at really in hospital in [facility].  Per records, patient had subdural and subarachnoid hemorrhage, intraparenchymal hemorrhage occipital bone fracture and hematoma.  Patient was admitted to the hospital in [facility] and discharged on 5/12.  Patient reports since sustaining the injury he has had persistent headache.  He was discharged on Keppra and Fioricet.  He has been taking that around-the-clock.  He reports it does not help his headache.  He should reports a headache he had tonight was the worst ever.  It is a tight band across his forehead.  That headache is left up slightly and now he has pain to the top of his head.  No focal weakness numbness no difficulties walking talking.  Patient reports he is taking all his medications as prescribed. Past Medical History  Diagnosis Date  . Hypertension   . GERD (gastroesophageal reflux disease)   . Hyperlipidemia   . 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 Hyperlipidemia   . Diabetes mellitus     type 2-off all meds now after wt loss   Past Surgical History  Procedure Laterality Date  . Arthroscopy x 1 left (rendall)      X 2 right  . Ganglion cyst excision      right wrist (kuzma)  . Correction of deviated septum    . Tonsillectomy    . Pilonidal cyst / sinus excision  58 yrs old  . Gastric  banding port revision  8/11  . Ulnar collateral ligament repair Right 09/21/2013    Procedure: ULNAR COLLATERAL LIGAMENT REPAIR METACARPAL PHALANGE RIGHT THUMB POSSIBLE APL GRAFT RECONSTRUCTION;  Surgeon: Wynonia Sours, MD;  Location: [place];  Service: Orthopedics;  Laterality: Right;   Family History  Problem Relation Age of Onset  . Cancer Father     lung cancer metatstatic cancer to brain  . Coronary artery disease Father   . Heart attack Father     X 2 (41,45)  . Heart disease Father     CAD/MI x 2  . Cancer Mother     rapid on set  . Hypertension Mother   . Diabetes Neg Hx   . Parkinsonism Maternal Grandfather    History  Substance Use Topics  . Smoking status: Former Smoker    Quit date: 06/13/1995  . Smokeless tobacco: Never Used  . Alcohol Use: 1.8 oz/week    3 Glasses of wine per week    Review of Systems  See History of Present Illness; otherwise all other systems are reviewed and negative   Allergies  Penicillins and Codeine  Home Medications   Prior to Admission medications   Medication Sig Start Date End Date Taking? Authorizing Provider  aspirin 81 MG tablet Take 81 mg by mouth daily.      Historical Provider, MD  CIALIS 20 MG  tablet TAKE 1 TABLET BY MOUTH AS NEEDED    Neena Rhymes, MD  ibuprofen (ADVIL,MOTRIN) 200 MG tablet Take 200 mg by mouth 3 (three) times daily.      Historical Provider, MD  Multiple Vitamins-Minerals (ONE-A-DAY EXTRAS ANTIOXIDANT) CAPS Take 1 capsule by mouth daily.      Historical Provider, MD  oxyCODONE-acetaminophen (PERCOCET) 10-325 MG per tablet Take 1 tablet by mouth every 4 (four) hours as needed for pain. 09/21/13   Wynonia Sours, MD  triazolam (HALCION) 0.25 MG tablet TAKE 1 TABLET AT BEDTIME FOR SLEEP 02/06/14   Janith Lima, MD   BP 176/98  Pulse 59  Resp 11  SpO2 93% Physical Exam  Nursing note and vitals reviewed. Constitutional: He is oriented to person, place, and time. He appears  well-developed and well-nourished. He appears distressed (patient is irritable ,uncomfortable).  HENT:  Head: Normocephalic and atraumatic.  Right Ear: External ear normal.  Left Ear: External ear normal.  Nose: Nose normal.  Mouth/Throat: Oropharynx is clear and moist.  Eyes: Conjunctivae and EOM are normal. Pupils are equal, round, and reactive to light.  Neck: Normal range of motion. Neck supple. No JVD present. No tracheal deviation present. No thyromegaly present.  Cardiovascular: Normal rate, regular rhythm, normal heart sounds and intact distal pulses.  Exam reveals no gallop and no friction rub.   No murmur heard. Pulmonary/Chest: Effort normal and breath sounds normal. No stridor. No respiratory distress. He has no wheezes. He has no rales. He exhibits no tenderness.  Abdominal: Soft. Bowel sounds are normal. He exhibits no distension and no mass. There is no tenderness. There is no rebound and no guarding.  Musculoskeletal: Normal range of motion. He exhibits no edema and no tenderness.  Lymphadenopathy:    He has no cervical adenopathy.  Neurological: He is alert and oriented to person, place, and time. He has normal reflexes. No cranial nerve deficit. He exhibits normal muscle tone. Coordination normal.  Skin: Skin is warm and dry. No rash noted. No erythema. No pallor.  Psychiatric: He has a normal mood and affect. His behavior is normal. Judgment and thought content normal.    ED Course  Procedures (including critical care time) Labs Review Labs Reviewed  CBC - Abnormal; Notable for the following:    RBC 3.72 (*)    Hemoglobin 11.9 (*)    HCT 34.9 (*)    Platelets 100 (*)    All other components within normal limits  DIFFERENTIAL - Abnormal; Notable for the following:    Monocytes Relative 20 (*)    Monocytes Absolute 1.1 (*)    All other components within normal limits  COMPREHENSIVE METABOLIC PANEL - Abnormal; Notable for the following:    Albumin 3.4 (*)    AST  157 (*)    ALT 208 (*)    All other components within normal limits  I-STAT CHEM 8, ED - Abnormal; Notable for the following:    Potassium 3.6 (*)    Hemoglobin 11.9 (*)    HCT 35.0 (*)    All other components within normal limits  PROTIME-INR  APTT  ETHANOL  CBG MONITORING, ED  CBG MONITORING, ED  I-STAT TROPOININ, ED    Imaging Review Ct Head (brain) Wo Contrast  03/25/2014   CLINICAL DATA:  Acute onset of severe headache 1 hr ago. Skull fracture with subdural, subarachnoid and parenchymal hemorrhage in [place], [place] 10 days ago.  EXAM: CT HEAD WITHOUT CONTRAST  TECHNIQUE: Contiguous axial  images were obtained from the base of the skull through the vertex without intravenous contrast.  COMPARISON:  12/10/2012.  FINDINGS: Right tentorial subdural hematoma. This appears acute. There is also a small amount of high density subarachnoid hemorrhage in the left frontal region which also appears acute. Small area of high density hemorrhage in the left parietal-occipital subarachnoid space. Also noted is white matter low density in the left corona region which was not previously present. There are also interval low density subdural fluid collections, measuring 12 mm in maximum thickness on the left and 10 mm in maximum thickness on the right. Normal size and position of the ventricles. Nondisplaced right occipital skull fracture with a small overlying scalp hematoma. No intraventricular hemorrhage is seen. Normally pneumatized paranasal sinuses.  IMPRESSION: 1. Probable acute right tentorial subdural hematoma. 2. Probable acute left frontal and left parietal occipital subarachnoid hemorrhage. 3. Interval left frontal encephalomalacia. 4. Interval bilateral subdural hygromas. 5. Nondisplaced right occipital bone fracture. These results were called by telephone at the time of interpretation on 03/25/2014 at 3:09 AM to Dr. Nicole Kindred, who verbally acknowledged these results.   Electronically Signed   By:  Enrique Sack M.D.   On: 03/25/2014 03:13     EKG Interpretation None       Prior CT scan 5/10 [facility] Addendum by Hurshel Keys, MD on 03/20/2014  7:10 AM This critical report was discussed with Lenoria Farrier charge nurse by Heber Lodi on Mar 19, 2014 07:28:00 EDT.   Result Impression  IMPRESSION:   1.  Subdural and subarachnoid hemorrhage involving supratentorial and infratentorial regions as described above without significant mass effect.  2.  Possible small area of intraparenchymal hemorrhage RIGHT posterior cerebellum adjacent to the patient's skull fracture measuring about 1.5 cm in diameter.  3.  Scalp hematoma posteriorly on the RIGHT with adjacent nondisplaced occipital bone fracture.  4.  Some opacified mastoid air cells on the RIGHT without evidence of extension of the patient's skull fracture into the region.  5.  Old facial bone fractures .   Result Narrative  PROCEDURE: CT Head without Contrast .   HISTORY: Trauma.   TECHNIQUE: Axial images were performed without the administration of IV contrast with or without multiplanar reformations .   COMPARISON: None .   TECHNICAL QUALITY: Satisfactory .    FINDINGS:   Supratentorial     Acute: Tiny subdural over the convexity on the LEFT 2 mm thick extending into the LEFT middle cranial fossa with no significant mass effect.  Small amount of subarachnoid hemorrhage adjacent to LEFT frontal and temporal lobe without significant mass effect.  Possible small falcine subdural 2 mm thick on the LEFT anteriorly and inferiorly without significant mass effect.  Bilateral tentorial subdurals with the thickest on the RIGHT at 4 mm.     Chronic: No encephalomalacia, old ischemia, or atrophy .   Infratentorial     Acute: Small subdural adjacent to the clivus measuring 3 mm thick possible small amount of subarachnoid hemorrhage bilaterally without mass effect.  1.5 cm intraparenchymal hemorrhage posterior RIGHT cerebellum.      Chronic: No encephalomalacia, old ischemia, or atrophy .   Ventricles: Normal size for patient's age .   Skull and scalp: Small scalp hematoma posteriorly on the RIGHT with nondisplaced occipital bone fracture.  Visualized facial bones/soft tissues: Old RIGHT lamina papyracea fracture and bilateral nasal bone fractures.   Visualized paranasal sinuses:  No significant abnormality .  Visualized mastoids: Opacified mastoid air cells on  the RIGHT.  No definite extension of the patient's occipital bone fracture into the region.   Other: None .       MDM   Final diagnoses:  Subarachnoid hemorrhage following injury  Headache  Subdural hygroma  Type II or unspecified type diabetes mellitus without mention of complication, not stated as uncontrolled  Unspecified essential hypertension   Pt initially called at code stroke due to severe sudden headache and possible facial droop.  None noted upon arrival.  In light of recent fall and intracranial bleeding, pt not a candidate for TPA.  Aside from bleeding risk, pt has normal neuro exam aside from pain.  Radiology reading possible acute bleed on today's CT scan, they are unable to view prior scan from [facility]  Pt now feeling better after morphine.  Discussed with Dr Vertell Limber, [phone] am, on call for nsgy.  He has viewed scan.  He does not feel pt is at acute risk for decompensation, but agrees with admission, neuro and alcohol withdrawal monitoring, pain control.  He can either consult or follow up with the patient in a month with repeat head CT.  Discussed with Dr Alcario Drought (0528 am, 0543 am) on call for hospitalist service.  He does not feel comfortable admitting the patient to his service, feels that the patient is out of his scope of practice.  He does not feel the patient is stable for floor admission, feels that if he has an acute subarachnoid bleed, should be managed in the ICU.  Discussed with Dr Vertell Limber (0606 am) for admission to nsgy service.  Dr  Vertell Limber reports he is happy to consult on the patient, but does not feel the patient needs admission to the nsgy team.  He feels patient is better suited on the hospitalist service to manage post traumatic headache pain, manage alcohol withdrawal.  Dr Vertell Limber does not have concern for decompensation from his injuries, feels that they are stable and residual from his most recent trauma.  He again requests admission to hospitalist service.    Will reconsult hospitalist service.       Kalman Drape, MD 03/25/14 [phone]

## 2014-03-25 NOTE — ED Notes (Signed)
RN unable to take report at this time.  Needs approximately 10 min to take report.

## 2014-03-25 NOTE — Consult Note (Signed)
Reason for Consult:cerebral contusions, small amount of SAH and headache after fall Referring Physician: Josie [name] is an 58 y.o. male.  HPI: [name] [name] is an 58 y.o. male with a history of hypertension, hyperlipidemia, and GERD and diabetes mellitus who presented to the emergency room and code stroke status with severe headache and mild facial droop. Patient sustained head trauma about 10 days ago and reportedly had subdural as well as subarachnoid intracranial hemorrhage. He was then [place] at that time and was treated at [facility]. Patient reportedly was intoxicated at the time of head trauma with an alcohol level greater than 400. CT scan tonight shows probable acute right tentorial subdural hematoma as well as possible acute left frontal and left parietal occipital subarachnoid hemorrhage. Left encephalomalacia was noted comment comparison to previous CT scan done here on 12/10/2012. Bilateral subdural hygromas also noted as well as a nondisplaced right occipital fracture. NIH stroke score was 1 for slurred speech. Alcohol level and urine drug screen pending. Patient had no focal deficits. Code stroke was canceled.   Past Medical History  Diagnosis Date  . Hypertension   . GERD (gastroesophageal reflux disease)   . Hyperlipidemia   . 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 Hyperlipidemia   . Diabetes mellitus     type 2-off all meds now after wt loss    Past Surgical History  Procedure Laterality Date  . Arthroscopy x 1 left (rendall)      X 2 right  . Ganglion cyst excision      right wrist (kuzma)  . Correction of deviated septum    . Tonsillectomy    . Pilonidal cyst / sinus excision  58 yrs old  . Gastric banding port revision  8/11  . Ulnar collateral ligament repair Right 09/21/2013    Procedure: ULNAR COLLATERAL LIGAMENT REPAIR METACARPAL PHALANGE RIGHT THUMB POSSIBLE APL GRAFT RECONSTRUCTION;  Surgeon: Wynonia Sours, MD;  Location: [place];  Service: Orthopedics;  Laterality: Right;    Family History  Problem Relation Age of Onset  . Cancer Father     lung cancer metatstatic cancer to brain  . Coronary artery disease Father   . Heart attack Father     X 2 (41,45)  . Heart disease Father     CAD/MI x 2  . Cancer Mother     rapid on set  . Hypertension Mother   . Diabetes Neg Hx   . Parkinsonism Maternal Grandfather     Social History:  reports that he quit smoking about 18 years ago. He has never used smokeless tobacco. He reports that he drinks about 1.8 ounces of alcohol per week. He reports that he does not use illicit drugs.  Allergies:  Allergies  Allergen Reactions  . Penicillins Anaphylaxis  . Codeine Nausea And Vomiting    Medications: I have reviewed the patient's current medications.  Results for orders placed during the hospital encounter of 03/25/14 (from the past 48 hour(s))  CBG MONITORING, ED     Status: None   Collection Time    03/25/14  2:46 AM      Result Value Ref Range   Glucose-Capillary 90  70 - 99 mg/dL  PROTIME-INR     Status: None   Collection Time    03/25/14  2:49 AM      Result Value Ref Range   Prothrombin Time 11.8  11.6 - 15.2 seconds  INR 0.88  0.00 - 1.49  APTT     Status: None   Collection Time    03/25/14  2:49 AM      Result Value Ref Range   aPTT 26  24 - 37 seconds  CBC     Status: Abnormal   Collection Time    03/25/14  2:49 AM      Result Value Ref Range   WBC 5.4  4.0 - 10.5 K/uL   RBC 3.72 (*) 4.22 - 5.81 MIL/uL   Hemoglobin 11.9 (*) 13.0 - 17.0 g/dL   HCT 34.9 (*) 39.0 - 52.0 %   MCV 93.8  78.0 - 100.0 fL   MCH 32.0  26.0 - 34.0 pg   MCHC 34.1  30.0 - 36.0 g/dL   RDW 14.1  11.5 - 15.5 %   Platelets 100 (*) 150 - 400 K/uL   Comment: REPEATED TO VERIFY     PLATELET COUNT CONFIRMED BY SMEAR  DIFFERENTIAL     Status: Abnormal   Collection Time    03/25/14  2:49 AM      Result Value Ref Range   Neutrophils Relative % 43  43 - 77  %   Lymphocytes Relative 35  12 - 46 %   Monocytes Relative 20 (*) 3 - 12 %   Eosinophils Relative 2  0 - 5 %   Basophils Relative 0  0 - 1 %   Neutro Abs 2.3  1.7 - 7.7 K/uL   Lymphs Abs 1.9  0.7 - 4.0 K/uL   Monocytes Absolute 1.1 (*) 0.1 - 1.0 K/uL   Eosinophils Absolute 0.1  0.0 - 0.7 K/uL   Basophils Absolute 0.0  0.0 - 0.1 K/uL   RBC Morphology POLYCHROMASIA PRESENT     WBC Morphology ATYPICAL LYMPHOCYTES     Comment: TOXIC GRANULATION   Smear Review LARGE PLATELETS PRESENT    COMPREHENSIVE METABOLIC PANEL     Status: Abnormal   Collection Time    03/25/14  2:49 AM      Result Value Ref Range   Sodium 140  137 - 147 mEq/L   Potassium 3.9  3.7 - 5.3 mEq/L   Chloride 101  96 - 112 mEq/L   CO2 24  19 - 32 mEq/L   Glucose, Bld 97  70 - 99 mg/dL   BUN 12  6 - 23 mg/dL   Creatinine, Ser 0.60  0.50 - 1.35 mg/dL   Calcium 9.1  8.4 - 10.5 mg/dL   Total Protein 6.8  6.0 - 8.3 g/dL   Albumin 3.4 (*) 3.5 - 5.2 g/dL   AST 157 (*) 0 - 37 U/L   ALT 208 (*) 0 - 53 U/L   Alkaline Phosphatase 101  39 - 117 U/L   Total Bilirubin 0.4  0.3 - 1.2 mg/dL   GFR calc non Af Amer >90  >90 mL/min   GFR calc Af Amer >90  >90 mL/min   Comment: (NOTE)     The eGFR has been calculated using the CKD EPI equation.     This calculation has not been validated in all clinical situations.     eGFR's persistently <90 mL/min signify possible Chronic Kidney     Disease.  ETHANOL     Status: None   Collection Time    03/25/14  2:49 AM      Result Value Ref Range   Alcohol, Ethyl (B) <11  0 - 11 mg/dL   Comment:  LOWEST DETECTABLE LIMIT FOR     SERUM ALCOHOL IS 11 mg/dL     FOR MEDICAL PURPOSES ONLY  I-STAT TROPOININ, ED     Status: None   Collection Time    03/25/14  2:52 AM      Result Value Ref Range   Troponin i, poc 0.00  0.00 - 0.08 ng/mL   Comment 3            Comment: Due to the release kinetics of cTnI,     a negative result within the first hours     of the onset of symptoms  does not rule out     myocardial infarction with certainty.     If myocardial infarction is still suspected,     repeat the test at appropriate intervals.  I-STAT CHEM 8, ED     Status: Abnormal   Collection Time    03/25/14  2:55 AM      Result Value Ref Range   Sodium 139  137 - 147 mEq/L   Potassium 3.6 (*) 3.7 - 5.3 mEq/L   Chloride 100  96 - 112 mEq/L   BUN 11  6 - 23 mg/dL   Creatinine, Ser 0.60  0.50 - 1.35 mg/dL   Glucose, Bld 98  70 - 99 mg/dL   Calcium, Ion 1.15  1.12 - 1.23 mmol/L   TCO2 24  0 - 100 mmol/L   Hemoglobin 11.9 (*) 13.0 - 17.0 g/dL   HCT 35.0 (*) 39.0 - 52.0 %  TSH     Status: None   Collection Time    03/25/14 11:05 AM      Result Value Ref Range   TSH 1.980  0.350 - 4.500 uIU/mL   Comment: Please note change in reference range.    Ct Head (brain) Wo Contrast  03/25/2014   CLINICAL DATA:  Acute onset of severe headache 1 hr ago. Skull fracture with subdural, subarachnoid and parenchymal hemorrhage in [place], [place] 10 days ago.  EXAM: CT HEAD WITHOUT CONTRAST  TECHNIQUE: Contiguous axial images were obtained from the base of the skull through the vertex without intravenous contrast.  COMPARISON:  12/10/2012.  FINDINGS: Right tentorial subdural hematoma. This appears acute. There is also a small amount of high density subarachnoid hemorrhage in the left frontal region which also appears acute. Small area of high density hemorrhage in the left parietal-occipital subarachnoid space. Also noted is white matter low density in the left corona region which was not previously present. There are also interval low density subdural fluid collections, measuring 12 mm in maximum thickness on the left and 10 mm in maximum thickness on the right. Normal size and position of the ventricles. Nondisplaced right occipital skull fracture with a small overlying scalp hematoma. No intraventricular hemorrhage is seen. Normally pneumatized paranasal sinuses.  IMPRESSION: 1. Probable  acute right tentorial subdural hematoma. 2. Probable acute left frontal and left parietal occipital subarachnoid hemorrhage. 3. Interval left frontal encephalomalacia. 4. Interval bilateral subdural hygromas. 5. Nondisplaced right occipital bone fracture. These results were called by telephone at the time of interpretation on 03/25/2014 at 3:09 AM to Dr. Nicole Kindred, who verbally acknowledged these results.   Electronically Signed   By: Enrique Sack M.D.   On: 03/25/2014 03:13    Review of Systems - Negative except ETOH abuse    Blood pressure 165/93, pulse 58, resp. rate 15, SpO2 98.00%. Physical Exam  Constitutional: He is oriented to person, place, and time. He appears  well-developed.  HENT:  Head: Normocephalic and atraumatic.  Eyes: EOM are normal. Pupils are equal, round, and reactive to light.  Neck: Normal range of motion. Neck supple.  Cardiovascular: Normal rate, regular rhythm and normal heart sounds.   Respiratory: Effort normal and breath sounds normal.  GI: Soft. Bowel sounds are normal.  Musculoskeletal: Normal range of motion.  Neurological: He is alert and oriented to person, place, and time. He has normal reflexes.  Psychiatric: He has a normal mood and affect. His behavior is normal. Judgment and thought content normal.   Patient is slurring his speech slightly.  He has mild tenderness over occiput on right.  Assessment/Plan: Patient with fall 10 days ago, alcoholism, headache and recent head CT showing small fronto-temporal contusions, right occipital skull fracture and small tentorial SDH.  No acute neurosurgical issues at present.  If repeat head CT stable, will not require further neurosurgical intervention.  Observe, mobilize on Medical service and manage pain.  Observe for ETOH withdrawal.  Erline Levine, MD 03/25/2014, 12:35 PM

## 2014-03-26 ENCOUNTER — Inpatient Hospital Stay (HOSPITAL_COMMUNITY): Payer: BC Managed Care – PPO

## 2014-03-26 DIAGNOSIS — S066XAA Traumatic subarachnoid hemorrhage with loss of consciousness status unknown, initial encounter: Secondary | ICD-10-CM

## 2014-03-26 DIAGNOSIS — S066X9A Traumatic subarachnoid hemorrhage with loss of consciousness of unspecified duration, initial encounter: Secondary | ICD-10-CM

## 2014-03-26 LAB — CBC
HCT: 36.5 % — ABNORMAL LOW (ref 39.0–52.0)
Hemoglobin: 12.4 g/dL — ABNORMAL LOW (ref 13.0–17.0)
MCH: 31.8 pg (ref 26.0–34.0)
MCHC: 34 g/dL (ref 30.0–36.0)
MCV: 93.6 fL (ref 78.0–100.0)
Platelets: 129 10*3/uL — ABNORMAL LOW (ref 150–400)
RBC: 3.9 MIL/uL — ABNORMAL LOW (ref 4.22–5.81)
RDW: 13.9 % (ref 11.5–15.5)
WBC: 7.3 10*3/uL (ref 4.0–10.5)

## 2014-03-26 LAB — BASIC METABOLIC PANEL
BUN: 7 mg/dL (ref 6–23)
CO2: 28 mEq/L (ref 19–32)
Calcium: 9.2 mg/dL (ref 8.4–10.5)
Chloride: 98 mEq/L (ref 96–112)
Creatinine, Ser: 0.54 mg/dL (ref 0.50–1.35)
GFR calc Af Amer: >90 mL/min (ref >90)
GFR calc non Af Amer: >90 mL/min (ref >90)
Glucose, Bld: 118 mg/dL — ABNORMAL HIGH (ref 70–99)
Potassium: 3.7 mEq/L (ref 3.7–5.3)
Sodium: 137 mEq/L (ref 137–147)

## 2014-03-26 MED ORDER — ACETAMINOPHEN 500 MG PO TABS: 500.0000 mg | ORAL_TABLET | Freq: Four times a day (QID) | ORAL | Status: DC | PRN

## 2014-03-26 MED ORDER — LORAZEPAM 2 MG/ML IJ SOLN: 1.0000 mg | Freq: Four times a day (QID) | INTRAMUSCULAR | Status: DC | PRN

## 2014-03-26 MED ORDER — ADULT MULTIVITAMIN W/MINERALS CH
1.0000 | ORAL_TABLET | Freq: Every day | ORAL | Status: DC
  Administered 2014-03-26 – 2014-03-27 (×2): 1 via ORAL
  Filled 2014-03-26 (×2): qty 1

## 2014-03-26 MED ORDER — IBUPROFEN 800 MG PO TABS
800.0000 mg | ORAL_TABLET | Freq: Three times a day (TID) | ORAL | Status: DC | PRN
  Administered 2014-03-26 – 2014-03-27 (×2): 800 mg via ORAL
  Filled 2014-03-26 (×3): qty 1

## 2014-03-26 MED ORDER — VITAMIN B-1 100 MG PO TABS
100.0000 mg | ORAL_TABLET | Freq: Every day | ORAL | Status: DC
  Administered 2014-03-26 – 2014-03-27 (×2): 100 mg via ORAL
  Filled 2014-03-26 (×2): qty 1

## 2014-03-26 MED ORDER — CLONIDINE HCL 0.1 MG PO TABS
0.1000 mg | ORAL_TABLET | Freq: Three times a day (TID) | ORAL | Status: DC
  Administered 2014-03-26 – 2014-03-27 (×4): 0.1 mg via ORAL
  Filled 2014-03-26 (×6): qty 1

## 2014-03-26 MED ORDER — LORAZEPAM 1 MG PO TABS
1.0000 mg | ORAL_TABLET | Freq: Four times a day (QID) | ORAL | Status: DC | PRN
  Filled 2014-03-26: qty 1

## 2014-03-26 MED ORDER — BUTALBITAL-APAP-CAFFEINE 50-325-40 MG PO TABS
1.0000 | ORAL_TABLET | ORAL | Status: DC | PRN
  Administered 2014-03-26 (×2): 1 via ORAL
  Administered 2014-03-27 (×2): 2 via ORAL
  Filled 2014-03-26 (×2): qty 2
  Filled 2014-03-26 (×2): qty 1

## 2014-03-26 MED ORDER — FOLIC ACID 1 MG PO TABS
1.0000 mg | ORAL_TABLET | Freq: Every day | ORAL | Status: DC
  Administered 2014-03-26 – 2014-03-27 (×2): 1 mg via ORAL
  Filled 2014-03-26 (×2): qty 1

## 2014-03-26 MED ORDER — THIAMINE HCL 100 MG/ML IJ SOLN
100.0000 mg | Freq: Every day | INTRAMUSCULAR | Status: DC
  Filled 2014-03-26 (×2): qty 1

## 2014-03-26 NOTE — Progress Notes (Signed)
[place] TEAM 1 - Stepdown/ICU TEAM Progress Note  DIA JEFFERYS WUJ:[id number] DOB: December 21, 1955 DOA: 03/25/2014 PCP: Adella Hare, MD  Admit HPI / Brief Narrative: 58 y.o. M w/ history of hypertension came into the hospital with severe headache. Patient was discharged 03/21/14 from [facility] in [place], [place] where he spent 3 days for tx of a subarachnoid hemorrhage. Patient fell on Saturday 5/9 night while he was drunk (EtOH level 475!) and had SDH/SAH and depressed skull fracture. He was discharged from the hospital without intervention.  He came to [facility] 5/16 because of severe headache. Patient denied seizure-like activity, lethargy, confusion, weakness or slurred speech.   In the ED CT showed SDH, SAH and nondisplaced right occipital bone fracture. Neurology and Neurosurgery consulted and both recommended to watch patient overnight and repeat CT the following day. The headache resolved after morphine administration in the ED.  HPI/Subjective: Pt c/o bifrontal HA that at times spreads around his entire head like a band.  He denies additional recent falls.  He denies visual change or focal neurologic deficits.  He has also noted severe nausea w/ vomiting of bile this morning (appears coincident w/ use of narcotic - pt has reported "allergy" of N/V w/ codeine).  Assessment/Plan:  SAH/SDH Observation as prescribed by Neurosurgery - f/u CT suggests improvement already   Headache Likely a natural consequence of above (blood products loose in brain/skull, skull fx) - avoid use of narcotics which could lead to narcotic withdrawal HA and perpetuate the HA issue, as well as the nausea   EtOH abuse Monitor for withdrawal - EtOH level undetectable at admit - pt appears restless, c/w early withrawal - monitor in SDU - CIWA protocol to begin   Transaminitis likely due to EtOH abuse - follow   Hypertension Poorly controlled - adjust tx and follow - likely exacerbated by  agitation and pain   Hypothyroidism Cont usual synthroid dose   Hx of DM Off meds since Jan 2014 s/p signif weight loss via lap ban procedure  Code Status: FULL Family Communication: spoke w/ wife and son at bedside at length  Disposition Plan: SDU  Consultants: Neuro Neurosurgery   Procedures: 5/9 - CT Head - ROANOKE - Tiny subdural over the convexity on the LEFT 2 mm thick extending into the LEFT middle cranial fossa with no significant mass effect. Small amount of subarachnoid hemorrhage adjacent to LEFT frontal and temporal lobe without significant mass effect. Possible small falcine subdural 2 mm thick on the LEFT anteriorly and inferiorly without significant mass effect. Bilateral tentorial subdurals with the thickest on the RIGHT at 4 mm. Small subdural adjacent to the clivus measuring 3 mm thick possible small amount of subarachnoid hemorrhage bilaterally without mass effect. 1.5 cm intraparenchymal hemorrhage posterior RIGHT cerebellum. Small scalp hematoma posteriorly on the RIGHT with nondisplaced occipital bone fracture.  Antibiotics: none  DVT prophylaxis: SCDs  Objective: Blood pressure 175/82, pulse 41, temperature 97.4 F (36.3 C), temperature source Oral, resp. rate 12, height 6\' 2"  (1.88 m), weight 100 kg (220 lb 7.4 oz), SpO2 97.00%.  Intake/Output Summary (Last 24 hours) at 03/26/14 0919 Last data filed at 03/26/14 0630  Gross per 24 hour  Intake    360 ml  Output    975 ml  Net   -615 ml   Exam: General: No acute respiratory distress - restless Lungs: Clear to auscultation bilaterally without wheezes or crackles Cardiovascular: Regular rhythm - mildly brady - without murmur gallop or rub normal  S1 and S2 Abdomen: Nontender, nondistended, soft, bowel sounds positive, no rebound, no ascites, no appreciable mass Extremities: No significant cyanosis, clubbing, or edema bilateral lower extremities  Data Reviewed: Basic Metabolic Panel:  Recent Labs Lab  03/25/14 0249 03/25/14 0255 03/26/14 0315  NA 140 139 137  K 3.9 3.6* 3.7  CL 101 100 98  CO2 24  --  28  GLUCOSE 97 98 118*  BUN 12 11 7   CREATININE 0.60 0.60 0.54  CALCIUM 9.1  --  9.2   Liver Function Tests:  Recent Labs Lab 03/25/14 0249  AST 157*  ALT 208*  ALKPHOS 101  BILITOT 0.4  PROT 6.8  ALBUMIN 3.4*   No results found for this basename: LIPASE, AMYLASE,  in the last 168 hours No results found for this basename: AMMONIA,  in the last 168 hours  CBC:  Recent Labs Lab 03/25/14 0249 03/25/14 0255 03/26/14 0315  WBC 5.4  --  7.3  NEUTROABS 2.3  --   --   HGB 11.9* 11.9* 12.4*  HCT 34.9* 35.0* 36.5*  MCV 93.8  --  93.6  PLT 100*  --  129*   CBG:  Recent Labs Lab 03/25/14 0246  GLUCAP 90    Recent Results (from the past 240 hour(s))  MRSA PCR SCREENING     Status: None   Collection Time    03/25/14  9:28 AM      Result Value Ref Range Status   MRSA by PCR NEGATIVE  NEGATIVE Final   Comment:            The GeneXpert MRSA Assay (FDA     approved for NASAL specimens     only), is one component of a     comprehensive MRSA colonization     surveillance program. It is not     intended to diagnose MRSA     infection nor to guide or     monitor treatment for     MRSA infections.     Studies:  Recent x-ray studies have been reviewed in detail by the Attending Physician  Scheduled Meds:  Scheduled Meds: . amLODipine  10 mg Oral Daily  . enalapril  10 mg Oral Daily  . levETIRAcetam  1,000 mg Oral BID  . pantoprazole  40 mg Oral QAC breakfast  . sodium chloride  3 mL Intravenous Q12H    Time spent on care of this patient: 35 mins  Cherene Altes, MD [facility] For Consults/Admissions - Flow Manager - [phone] Office  [phone] Pager [phone]  On-Call/Text Page:      [email or web address]      password [facility]  03/26/2014, 9:19 AM   LOS: 1 day

## 2014-03-26 NOTE — Progress Notes (Signed)
Subjective: Patient reports feeling better, but still with headache.  Objective: Vital signs in last 24 hours: Temp:  [96 F (35.6 C)-98 F (36.7 C)] 97.4 F (36.3 C) (05/17 0344) Pulse Rate:  [41-55] 41 (05/17 0410) Resp:  [12-16] 12 (05/17 0344) BP: (156-178)/(82-96) 175/82 mmHg (05/17 0410) SpO2:  [96 %-100 %] 97 % (05/17 0410) Weight:  [100 kg (220 lb 7.4 oz)] 100 kg (220 lb 7.4 oz) (05/16 1305)  Intake/Output from previous day: 05/16 0701 - 05/17 0700 In: 360 [P.O.:360] Out: 975 [Urine:975] Intake/Output this shift:    Physical Exam: Awake, alert, conversant.  No drift.  Mild headache.  Lab Results:  Recent Labs  03/25/14 0249 03/25/14 0255 03/26/14 0315  WBC 5.4  --  7.3  HGB 11.9* 11.9* 12.4*  HCT 34.9* 35.0* 36.5*  PLT 100*  --  129*   BMET  Recent Labs  03/25/14 0249 03/25/14 0255 03/26/14 0315  NA 140 139 137  K 3.9 3.6* 3.7  CL 101 100 98  CO2 24  --  28  GLUCOSE 97 98 118*  BUN 12 11 7   CREATININE 0.60 0.60 0.54  CALCIUM 9.1  --  9.2    Studies/Results: Ct Head Wo Contrast  03/26/2014   CLINICAL DATA:  Follow-up hemorrhage.  EXAM: CT HEAD WITHOUT CONTRAST  TECHNIQUE: Contiguous axial images were obtained from the base of the skull through the vertex without intravenous contrast.  COMPARISON:  CT HEAD W/O CM dated 03/25/2014  FINDINGS: Mild asymmetric density along the right cerebellar tentorium consistent with subdural hematoma. Evolving left inferior frontal hemorrhagic contusion with surrounding low-density vasogenic edema and decreased hemorrhagic component. Bilateral holohemispheric low-density extra-axial fluid collections, largest on the right measuring 5 mm with slight mass effect on the subjacent sulci. No residual subarachnoid blood. No hydrocephalus. No acute large vascular territory infarct.  Basal cisterns are patent. Ocular globes and orbital contents are unremarkable. Remote right medial orbital blowout fracture. Mastoid air cells are  well aerated. Nondisplaced right occipital skull fracture extending to the condyle. Small right parietal scalp hematoma without subcutaneous gas or radiopaque foreign bodies.  IMPRESSION: Small residual subdural hematoma along the right cerebellar tentorium, evolving left inferior frontal lobe hemorrhagic contusion with stable small bilateral holohemispheric hygromas versus degenerated subdural hematomas. Resolution of subarachnoid blood without hydrocephalus.  Nondisplaced right occipital skull fracture.   Electronically Signed   By: Elon Alas   On: 03/26/2014 06:59   Ct Head (brain) Wo Contrast  03/25/2014   CLINICAL DATA:  Acute onset of severe headache 1 hr ago. Skull fracture with subdural, subarachnoid and parenchymal hemorrhage in [place], [place] 10 days ago.  EXAM: CT HEAD WITHOUT CONTRAST  TECHNIQUE: Contiguous axial images were obtained from the base of the skull through the vertex without intravenous contrast.  COMPARISON:  12/10/2012.  FINDINGS: Right tentorial subdural hematoma. This appears acute. There is also a small amount of high density subarachnoid hemorrhage in the left frontal region which also appears acute. Small area of high density hemorrhage in the left parietal-occipital subarachnoid space. Also noted is white matter low density in the left corona region which was not previously present. There are also interval low density subdural fluid collections, measuring 12 mm in maximum thickness on the left and 10 mm in maximum thickness on the right. Normal size and position of the ventricles. Nondisplaced right occipital skull fracture with a small overlying scalp hematoma. No intraventricular hemorrhage is seen. Normally pneumatized paranasal sinuses.  IMPRESSION: 1. Probable acute right  tentorial subdural hematoma. 2. Probable acute left frontal and left parietal occipital subarachnoid hemorrhage. 3. Interval left frontal encephalomalacia. 4. Interval bilateral subdural hygromas.  5. Nondisplaced right occipital bone fracture. These results were called by telephone at the time of interpretation on 03/25/2014 at 3:09 AM to Dr. Nicole Kindred, who verbally acknowledged these results.   Electronically Signed   By: Enrique Sack M.D.   On: 03/25/2014 03:13    Assessment/Plan: Head CT improved.  I had a long discussion with patient and his wife about alcohol abuse and need for him to stop drinking and get help for this problem.  He can move from [employer] and work with PT.  OK to D/C home from my standpoint.  He will need to follow up with me in office in 1 month with repeat head CT.    LOS: 1 day    Erline Levine, MD 03/26/2014, 11:10 AM

## 2014-03-27 LAB — COMPREHENSIVE METABOLIC PANEL
ALT: 240 U/L — ABNORMAL HIGH (ref 0–53)
AST: 176 U/L — ABNORMAL HIGH (ref 0–37)
Albumin: 3.5 g/dL (ref 3.5–5.2)
Alkaline Phosphatase: 81 U/L (ref 39–117)
BUN: 9 mg/dL (ref 6–23)
CO2: 31 mEq/L (ref 19–32)
Calcium: 9.9 mg/dL (ref 8.4–10.5)
Chloride: 95 mEq/L — ABNORMAL LOW (ref 96–112)
Creatinine, Ser: 0.62 mg/dL (ref 0.50–1.35)
GFR calc Af Amer: >90 mL/min (ref >90)
GFR calc non Af Amer: >90 mL/min (ref >90)
Glucose, Bld: 170 mg/dL — ABNORMAL HIGH (ref 70–99)
Potassium: 4.6 mEq/L (ref 3.7–5.3)
Sodium: 136 mEq/L — ABNORMAL LOW (ref 137–147)
Total Bilirubin: 0.5 mg/dL (ref 0.3–1.2)
Total Protein: 7.3 g/dL (ref 6.0–8.3)

## 2014-03-27 LAB — CBC
HCT: 38 % — ABNORMAL LOW (ref 39.0–52.0)
Hemoglobin: 13.1 g/dL (ref 13.0–17.0)
MCH: 32 pg (ref 26.0–34.0)
MCHC: 34.5 g/dL (ref 30.0–36.0)
MCV: 92.9 fL (ref 78.0–100.0)
Platelets: 152 10*3/uL (ref 150–400)
RBC: 4.09 MIL/uL — ABNORMAL LOW (ref 4.22–5.81)
RDW: 13.4 % (ref 11.5–15.5)
WBC: 7.1 10*3/uL (ref 4.0–10.5)

## 2014-03-27 LAB — LIPASE, BLOOD: Lipase: 30 U/L (ref 11–59)

## 2014-03-27 LAB — AMMONIA: Ammonia: 13 umol/L (ref 11–60)

## 2014-03-27 MED ORDER — BUTALBITAL-APAP-CAFFEINE 50-325-40 MG PO TABS: 1.0000 | ORAL_TABLET | ORAL | Status: DC | PRN

## 2014-03-27 MED ORDER — CLONIDINE HCL 0.1 MG PO TABS: 0.1000 mg | ORAL_TABLET | Freq: Three times a day (TID) | ORAL | Status: DC

## 2014-03-27 NOTE — Discharge Summary (Signed)
DISCHARGE SUMMARY  [name] [name]  MR#: [id number]  DOB:1956/10/08  Date of Admission: 03/25/2014 Date of Discharge: 03/27/2014  Attending Physician:Deannah Rossi T Makinsley Schiavi  Patient's [name] [name]  Consults: Neurology Neurosurgery   Disposition: D/C home   Follow-up Appts:     Follow-up Information   Call Adella Hare, [name]. (Call [facility] to schedule an appointment within the next 5-10 days for a check of your BP )    Specialty:  Internal Medicine   Contact information:   [address] [address] [phone]       Follow up with [name], [name]. Schedule an appointment as soon as possible for a visit in 1 month.   Specialty:  Neurosurgery   Contact information:   [address] [place] [address] [phone]      Tests Needing Follow-up: - recheck of BP - determination on discontinuation of Keppra at one month as per plan  - assure pt has stopped drinking EtOH  - recheck of LFTs, which were modestly elevated at time of d/c   Discharge Diagnoses: SAH/SDH  Headache  EtOH abuse  Transaminitis  Hypertension  Hypothyroidism  Hx of DM  Initial presentation: 58 y.o. M w/ history of hypertension came into the hospital with severe headache. Patient was discharged 03/21/14 from [facility] in [place], [place] where he spent 3 days for tx of a subarachnoid hemorrhage. Patient fell on Saturday 5/9 night while he was drunk (EtOH level 475!) and had SDH/SAH and depressed skull fracture. He was discharged from the hospital without intervention. He came to [facility] 5/16 because of severe headache. Patient denied seizure-like activity, lethargy, confusion, weakness or slurred speech.  In the ED CT showed SDH, SAH and nondisplaced right occipital bone fracture. Neurology and Neurosurgery consulted and both recommended to watch patient overnight and repeat CT the following day. The  headache resolved after morphine administration in the ED.   Hospital Course:  SAH/SDH  Pt was evaluated by Neurosurgery and Neruolgy - he remained clinically stable with no neruologic deficits th/o his hospital stay - f/u CT suggested improvement - pt to f/u w/ Dr. Vertell Limber in 1 month for f/u CT head - will plan to complete prophylactic 30 day tx w/ Keppra,(initiated at [facility]) with plan to stop at that time unless f/u findings suggest continued use would be advised    Headache  A consequence of above (blood products loose in brain/skull, skull fx) - avoided use of narcotics which could lead to narcotic withdrawal HA and perpetuate the HA issue, as well as the nausea - pt reported complete resolution of his HA w/ use of fioricet - will cont prn at d/c   EtOH abuse  Monitored for withdrawal - EtOH level undetectable at admit - pt did NOT require ativan under CIWA protocol - he has been advised to stop drinking alcohol completely   Transaminitis  likely due to EtOH abuse - LFTs w/o signif change on day of d/c - recheck in 2-3 weeks w/ complete EtOH abstinence  Hypertension  Poorly controlled - adjusted tx during hospital stay   Hypothyroidism  Cont usual synthroid dose   Hx of DM  Off meds since Jan 2014 s/p signif weight loss via lap ban procedure - CBG stable this admit     Medication List    STOP taking these medications       aspirin 81 MG tablet  butalbital-acetaminophen-caffeine 50-325-40-30 MG per capsule  Commonly known as:  FIORICET WITH CODEINE      TAKE these medications       amLODipine 10 MG tablet  Commonly known as:  NORVASC  Take 10 mg by mouth daily.     butalbital-acetaminophen-caffeine 50-325-40 MG per tablet  Commonly known as:  FIORICET, ESGIC  Take 1-2 tablets by mouth every 4 (four) hours as needed for headache.     cloNIDine 0.1 MG tablet  Commonly known as:  CATAPRES  Take 1 tablet (0.1 mg total) by mouth 3 (three) times daily.      enalapril 10 MG tablet  Commonly known as:  VASOTEC  Take 10 mg by mouth daily.     levETIRAcetam 1000 MG tablet  Commonly known as:  KEPPRA  Take 1,000 mg by mouth 2 (two) times daily.     loratadine-pseudoephedrine 10-240 MG per 24 hr tablet  Commonly known as:  CLARITIN-D 24-hour  Take 1 tablet by mouth daily as needed for allergies.     ONE-A-DAY EXTRAS ANTIOXIDANT Caps  Take 1 capsule by mouth daily.     pantoprazole 40 MG tablet  Commonly known as:  PROTONIX  Take 40 mg by mouth daily.     tadalafil 20 MG tablet  Commonly known as:  CIALIS  Take 20 mg by mouth daily as needed for erectile dysfunction.     triazolam 0.25 MG tablet  Commonly known as:  HALCION  Take 0.25 mg by mouth at bedtime as needed for sleep.        Day of Discharge BP 152/80  Pulse 48  Temp(Src) 97.6 F (36.4 C) (Oral)  Resp 17  Ht 6\' 2"  (1.88 m)  Wt 100 kg (220 lb 7.4 oz)  BMI 28.29 kg/m2  SpO2 95%  Physical Exam: General: No acute respiratory distress Lungs: Clear to auscultation bilaterally without wheezes or crackles Cardiovascular: Regular rate and rhythm without murmur gallop or rub normal S1 and S2 Abdomen: Nontender, nondistended, soft, bowel sounds positive, no rebound, no ascites, no appreciable mass Extremities: No significant cyanosis, clubbing, or edema bilateral lower extremities  Results for orders placed during the hospital encounter of 03/25/14 (from the past 24 hour(s))  COMPREHENSIVE METABOLIC PANEL     Status: Abnormal   Collection Time    03/27/14  2:53 AM      Result Value Ref Range   Sodium 136 (*) 137 - 147 mEq/L   Potassium 4.6  3.7 - 5.3 mEq/L   Chloride 95 (*) 96 - 112 mEq/L   CO2 31  19 - 32 mEq/L   Glucose, Bld 170 (*) 70 - 99 mg/dL   BUN 9  6 - 23 mg/dL   Creatinine, Ser 0.62  0.50 - 1.35 mg/dL   Calcium 9.9  8.4 - 10.5 mg/dL   Total Protein 7.3  6.0 - 8.3 g/dL   Albumin 3.5  3.5 - 5.2 g/dL   AST 176 (*) 0 - 37 U/L   ALT 240 (*) 0 - 53 U/L    Alkaline Phosphatase 81  39 - 117 U/L   Total Bilirubin 0.5  0.3 - 1.2 mg/dL   GFR calc non Af Amer >90  >90 mL/min   GFR calc Af Amer >90  >90 mL/min  CBC     Status: Abnormal   Collection Time    03/27/14  2:53 AM      Result Value Ref Range   WBC 7.1  4.0 - 10.5  K/uL   RBC 4.09 (*) 4.22 - 5.81 MIL/uL   Hemoglobin 13.1  13.0 - 17.0 g/dL   HCT 38.0 (*) 39.0 - 52.0 %   MCV 92.9  78.0 - 100.0 fL   MCH 32.0  26.0 - 34.0 pg   MCHC 34.5  30.0 - 36.0 g/dL   RDW 13.4  11.5 - 15.5 %   Platelets 152  150 - 400 K/uL  LIPASE, BLOOD     Status: None   Collection Time    03/27/14  2:53 AM      Result Value Ref Range   Lipase 30  11 - 59 U/L  AMMONIA     Status: None   Collection Time    03/27/14  2:53 AM      Result Value Ref Range   Ammonia 13  11 - 60 umol/L    Time spent in discharge (includes decision making & examination of pt): >30 minutes  03/27/2014, 1:16 PM   Cherene Altes, [name] [facility] Office  [phone] Pager [phone]  On-Call/Text Page:      [email or web address]      password [facility]

## 2014-03-27 NOTE — Progress Notes (Signed)
Pt discharged home per written orders. Wife and family present at bedside for d/c orders/instructions. All orders/instructions/follow up appts/medicatons/prescriptions/follow up care discussed. Time allowed for questions/concerns. Pt states he has none. IV discontinued, telemetry discontinued. Pt wheeled to the main entrance via wheelchair by Soyla Dryer, RN for his son to transport home.  Soyla Dryer, RN

## 2014-03-27 NOTE — Progress Notes (Signed)
Subjective: Patient reports "My headache finally stopped!"  Objective: Vital signs in last 24 hours: Temp:  [97 F (36.1 C)-98.4 F (36.9 C)] 97.4 F (36.3 C) (05/18 0730) Pulse Rate:  [48-54] 48 (05/18 0418) Resp:  [16-17] 17 (05/18 0418) BP: (152-191)/(80-94) 152/80 mmHg (05/18 0418) SpO2:  [95 %-100 %] 95 % (05/18 0418)  Intake/Output from previous day: 05/17 0701 - 05/18 0700 In: 240 [P.O.:240] Out: 2100 [Urine:2100] Intake/Output this shift:    Alert, sitting up in bed eating breakfast. Reports no pain or discomfort. PEARL. No drift. MAEW.  Lab Results:  Recent Labs  03/26/14 0315 03/27/14 0253  WBC 7.3 7.1  HGB 12.4* 13.1  HCT 36.5* 38.0*  PLT 129* 152   BMET  Recent Labs  03/26/14 0315 03/27/14 0253  NA 137 136*  K 3.7 4.6  CL 98 95*  CO2 28 31  GLUCOSE 118* 170*  BUN 7 9  CREATININE 0.54 0.62  CALCIUM 9.2 9.9    Studies/Results: Ct Head Wo Contrast  03/26/2014   CLINICAL DATA:  Follow-up hemorrhage.  EXAM: CT HEAD WITHOUT CONTRAST  TECHNIQUE: Contiguous axial images were obtained from the base of the skull through the vertex without intravenous contrast.  COMPARISON:  CT HEAD W/O CM dated 03/25/2014  FINDINGS: Mild asymmetric density along the right cerebellar tentorium consistent with subdural hematoma. Evolving left inferior frontal hemorrhagic contusion with surrounding low-density vasogenic edema and decreased hemorrhagic component. Bilateral holohemispheric low-density extra-axial fluid collections, largest on the right measuring 5 mm with slight mass effect on the subjacent sulci. No residual subarachnoid blood. No hydrocephalus. No acute large vascular territory infarct.  Basal cisterns are patent. Ocular globes and orbital contents are unremarkable. Remote right medial orbital blowout fracture. Mastoid air cells are well aerated. Nondisplaced right occipital skull fracture extending to the condyle. Small right parietal scalp hematoma without  subcutaneous gas or radiopaque foreign bodies.  IMPRESSION: Small residual subdural hematoma along the right cerebellar tentorium, evolving left inferior frontal lobe hemorrhagic contusion with stable small bilateral holohemispheric hygromas versus degenerated subdural hematomas. Resolution of subarachnoid blood without hydrocephalus.  Nondisplaced right occipital skull fracture.   Electronically Signed   By: Elon Alas   On: 03/26/2014 06:59    Assessment/Plan: Improving   LOS: 2 days  Pt reports plan to change rooms today with d/c to be coordinated by attending when appropriate. Verbalizes understanding of plan to see DrStern in office in 1 month with repeat head CT.    [name] [name] 03/27/2014, 8:58 AM

## 2014-03-27 NOTE — Discharge Instructions (Signed)
- STOP DRINKING ALCOHOL - COMPLETELY  - Obtain a new Internal Medicine doctor to attend to your high blood pressure and general health care as soon as possible.    Subarachnoid Hemorrhage Subarachnoid hemorrhage is bleeding in the area between the brain and the membrane that covers the brain (subarachnoid space). This increases the pressure on the brain and causes some areas of the brain to be deprived of blood flow. Subarachnoid hemorrhage is a medical emergency that may cause permanent brain damage, stroke, or even death if not treated.  CAUSES   Head injury.   Ruptured brain aneurysm.   Bleeding from blood vessels that develop abnormally (arteriovenous malformation).   Bleeding disorder.   Use of blood thinners (anticoagulants).  Use of certain drugs such as cocaine. For some people with subarachnoid hemorrhage, the cause is unknown.  RISK FACTORS  Smoking.  Having high blood pressure (hypertension).  Abusing alcohol.  Being a male, especially being of post-menopausal age.  Having a family history of disease in the blood vessels of the brain (cerebrovascular disease).  Having certain genetic syndromes that result in kidney disease or connective tissue disease. SIGNS AND SYMPTOMS   A sudden, severe headache with no known cause. The headache is often described as the worst headache ever experienced.  Nausea or vomiting, especially when combined with other symptoms such as a headache.  Sudden weakness or numbness of the face, arm, or leg, especially on one side of the body.  Sudden trouble walking or difficulty moving arms or legs.  Sudden confusion.  Sudden personality changes.  Trouble speaking (aphasia) or understanding.  Difficulty swallowing.  Sudden trouble seeing in one or both eyes.  Double vision.  Dizziness.  Loss of balance or coordination.  Intolerance to light.  Stiff neck. DIAGNOSIS  Your health care provider will perform a physical  exam and ask about your symptoms. If a subarachnoid hemorrhage is suspected, various tests may be ordered. These tests may include:   A CT scan.  An MRI.  A cerebral angiogram.  A spinal tap (lumbar puncture).  Blood tests. TREATMENT  Immediate treatment in the hospital is often required to reduce the risk of brain damage. Treatment will depend on the cause of the bleeding, where it is located, and the extent of the bleeding and damage. The goals of treatment include stopping the bleeding, repairing the cause of bleeding, providing relief of symptoms, and preventing complications.   Medicines may be given to:  Lower blood pressure (antihypertensives).  Relieve pain (analgesics).  Relieve nausea or vomiting.  Surgery may also be needed to stop the bleeding, repair the cause of the bleeding, or remove the blood.  Rehabilitation may be needed to improve any cognitive and day-to-day functions impaired by the condition. Further treatment depends on the duration, severity, and cause of your symptoms. Physical, speech, and occupational therapists will assess you and work to improve any functions impaired by the subarachnoid hemorrhage. Measures will be taken to prevent short-term and long-term complications, including infection from breathing foreign material into the lungs (aspiration pneumonia), blood clots in the legs, bedsores, and falls. HOME CARE INSTRUCTIONS After your hospitalization or inpatient rehabilitation is completed and you are well enough to go home, it is important to prevent a reoccurrence. Take these steps to help prevent this:  Take all your medicines exactly as instructed. Do not take any over-the-counter drugs or supplements without talking to your health care provider.  If swallow studies have determined that your swallowing reflex is  present, you should eat healthy foods. A low salt (sodium), low saturated fat, low trans fat, low cholesterol diet may be recommended to  manage high blood pressure. Foods may need to be a special consistency (soft or pureed), or small bites may need to be taken in order to avoid aspirating or choking.  Rest and limit activities or movements as directed by your health care provider.  Do not smoke. Talk to your health care provider about ways to quit smoking.  Limit alcohol use. Moderate alcohol use is considered to be:  No more than 2 drinks each day for men.  No more than 1 drink each day for nonpregnant women.  Make any other lifestyle changes as directed by your health care provider.  Monitor and record your blood pressure as directed by your health care provider.  A safe home environment is important to reduce the risk of falls. Your health care provider may arrange for specialists to evaluate your home. Having grab bars in the bedroom and bathroom is often important. Your health care provider may arrange for special equipment to be used at home, such as raised toilets and a seat for the shower.  Physical, occupational, and speech therapy. Ongoing therapy may be needed to maximize your recovery after a subarachnoid bleed. If you have been advised to use a walker or a cane, use it at all times. Be sure to keep your therapy appointments.  Keep all follow-up appointments with your health care provider and other specialists. This includes any referrals, physical therapy, and rehabilitation. SEEK IMMEDIATE MEDICAL CARE IF:   You suddenly have a sudden, severe headache with no known cause.  You have nausea or vomiting occurring with another symptom.  You have sudden weakness or numbness of the face, arm, or leg, especially on one side of the body.  You have sudden trouble walking or difficulty moving arms or legs.  You have sudden confusion.  You have trouble speaking (aphasia) or understanding.  You have sudden trouble seeing in one or both eyes.  You have a sudden loss of balance or coordination.  You have a  stiff neck.  You have difficulty breathing.  You have a partial or total loss of consciousness. Any of these symptoms may represent a serious problem that is an emergency. Do not wait to see if the symptoms will go away. Get medical help right away. Call your local emergency services (911 in [place] Do not drive yourself to the hospital. MAKE SURE YOU:   Understand these instructions.  Will watch your condition.  Will get help right away if you are not doing well or get worse. Document Released: 09/13/2004 Document Revised: 08/17/2013 Document Reviewed: 12/10/2012 [facility] Patient Information 2014 [place], [place].  Skull Fracture, Uncomplicated, Adult You have a skull fracture. This happens when one of the bones of your head cracks or breaks. Your injury does not appear serious at this time and we feel you can be observed safely at home. An injury to the head that causes a skull fracture may also cause a concussion. With a concussion you may be knocked out for a brief moment (loss of consciousness). A concussion is the mildest form of traumatic brain injury. The symptoms of a concussion are short-lived and resolve on their own. The most common symptoms are confusion and forgetting what happened (amnesia). SYMPTOMS These minor symptoms may be experienced after discharge:  Memory difficulties.  Dizziness.  Headaches.  Hearing difficulties.  Vertigo or trouble with balance.  Depression.  Tiredness.  Difficulty with concentration.  Nausea.  Vomiting. A bruise on the brain (concussion) requires a few days for recovery the same as a bruise elsewhere on your body. It is common for people with injuries such as yours to experience such problems. Usually these problems disappear without medical care. If symptoms remain for more than a few days, notify your caregiver. See your caregiver sooner if symptoms are becoming worse rather than better. HOME CARE INSTRUCTIONS   During the next  24 hours you should stay with an adult who can watch you for the above warning signs.  This person should wake you up every 02-03 hours to check on your condition, noting any of the above signs or symptoms. Problems which are getting worse mean you should call or return immediately to the facility where you were just seen, or to the nearest emergency department. In case of emergency or unconsciousness, call for local emergency medical help.  You should take clear liquids for the rest of the day and then resume your regular diet.  You should not take sedatives or alcoholic beverages for 48 hours after discharge.  After injuries such as yours, most serious problems happen within the first 24 hours.  If x-rays or other testing were done, make sure you know how you are to get those results. It is your responsibility to call back for results when your caregiver suggests. Do not assume everything is fine if your caregiver has not been able to reach you.  Skull fractures usually do not need follow up x-rays. These fractures are not like broken arms. The position of the skull stays the same as when it was broken and usually heals without problems.  If you have a concussion be aware that symptoms may last up to a week or longer.  It is very important to keep any follow-up appointments after a head injury.  It is unlikely that serious side effects will occur. If they do occur it is usually soon after the accident but may occur as long as a week after the accident or injury. SEEK IMMEDIATE MEDICAL CARE IF:   Confusion or drowsiness develops; children frequently become drowsy after any type of trauma or injury.  A person cannot arouse the injured person.  You feel sick to your stomach (nausea) or persistent, forceful vomiting (projectile in nature).  Rapid back and forth movement of the eyes. This is called nystagmus.  Convulsions, seizures, or unconsciousness.  Severe persistent headaches not  relieved by medication. Only take over-the-counter or prescription medicines for pain, discomfort, or fever as directed by your caregiver. (Do not take aspirin as this weakens blood clotting abilities).  Inability to use arms or legs appropriately.  Changes in pupil sizes.  Clear or bloody discharge from nose or ears. Document Released: 08/27/2004 Document Revised: 01/19/2012 Document Reviewed: 01/02/2009 [facility] Patient Information 2014 [place], [place].  Finding Treatment for Alcohol and Drug Addiction It can be hard to find the right place to get professional treatment. Here are some important things to consider:  There are different types of treatment to choose from.  Some programs are live-in (residential) while others are not (outpatient). Sometimes a combination is offered.  No single type of program is right for everyone.  Most treatment programs involve a combination of education, counseling, and a 12-step, spiritually-based approach.  There are non-spiritually based programs (not 12-step).  Some treatment programs are government sponsored. They are geared for patients without private insurance.  Treatment programs  can vary in many respects such as:  Cost and types of insurance accepted.  Types of on-site medical services offered.  Length of stay, setting, and size.  Overall philosophy of treatment. A person may need specialized treatment or have needs not addressed by all programs. For example, adolescents need treatment appropriate for their age. Other people have secondary disorders that must be managed as well. Secondary conditions can include mental illness, such as depression or diabetes. Often, a period of detoxification from alcohol or drugs is needed. This requires medical supervision and not all programs offer this. THINGS TO CONSIDER WHEN SELECTING A TREATMENT PROGRAM   Is the program certified by the appropriate government agency? Even private programs must  be certified and employ certified professionals.  Does the program accept your insurance? If not, can a payment plan be set up?  Is the facility clean, organized, and well run? Do they allow you to speak with graduates who can share their treatment experience with you? Can you tour the facility? Can you meet with staff?  Does the program meet the full range of individual needs?  Does the treatment program address sexual orientation and physical disabilities? Do they provide age, gender, and culturally appropriate treatment services?  Is treatment available in languages other than English?  Is long-term aftercare support or guidance encouraged and provided?  Is assessment of an individual's treatment plan ongoing to ensure it meets changing needs?  Does the program use strategies to encourage reluctant patients to remain in treatment long enough to increase the likelihood of success?  Does the program offer counseling (individual or group) and other behavioral therapies?  Does the program offer medicine as part of the treatment regimen, if needed?  Is there ongoing monitoring of possible relapse? Is there a defined relapse prevention program? Are services or referrals offered to family members to ensure they understand addiction and the recovery process? This would help them support the recovering individual.  Are 12-step meetings held at the center or is transport available for patients to attend outside meetings? In countries outside of the [place] and [place], [employer] for contact information for services in your area. Document Released: 09/25/2005 Document Revised: 01/19/2012 Document Reviewed: 04/06/2008 [facility] Patient Information 2014 [place].

## 2014-04-12 ENCOUNTER — Telehealth: Payer: Self-pay

## 2014-04-12 MED ORDER — TADALAFIL 20 MG PO TABS: 20.0000 mg | ORAL_TABLET | Freq: Every day | ORAL | Status: DC | PRN

## 2014-04-12 NOTE — Telephone Encounter (Signed)
Refill request from previous Dr. Linda Hedges patient scheduled to see Dr. Doug Sou (new MD) 07/13/14.  This patient has not seen Dr. Linda Hedges since 12/09/12.  Please advise on Cialis 20 mg refill.

## 2014-04-12 NOTE — Telephone Encounter (Signed)
Done erx 

## 2014-04-29 ENCOUNTER — Other Ambulatory Visit: Payer: Self-pay | Admitting: Internal Medicine

## 2014-05-05 ENCOUNTER — Ambulatory Visit (INDEPENDENT_AMBULATORY_CARE_PROVIDER_SITE_OTHER): Payer: BC Managed Care – PPO | Admitting: Internal Medicine

## 2014-05-05 ENCOUNTER — Encounter: Payer: Self-pay | Admitting: Internal Medicine

## 2014-05-05 ENCOUNTER — Other Ambulatory Visit (INDEPENDENT_AMBULATORY_CARE_PROVIDER_SITE_OTHER): Payer: BC Managed Care – PPO

## 2014-05-05 VITALS — BP 130/82 | HR 92 | Temp 97.5°F | Resp 16 | Ht 74.0 in | Wt 236.8 lb

## 2014-05-05 DIAGNOSIS — R945 Abnormal results of liver function studies: Secondary | ICD-10-CM

## 2014-05-05 DIAGNOSIS — E519 Thiamine deficiency, unspecified: Secondary | ICD-10-CM

## 2014-05-05 DIAGNOSIS — E559 Vitamin D deficiency, unspecified: Secondary | ICD-10-CM

## 2014-05-05 DIAGNOSIS — K219 Gastro-esophageal reflux disease without esophagitis: Secondary | ICD-10-CM

## 2014-05-05 DIAGNOSIS — E785 Hyperlipidemia, unspecified: Secondary | ICD-10-CM

## 2014-05-05 DIAGNOSIS — R7309 Other abnormal glucose: Secondary | ICD-10-CM

## 2014-05-05 DIAGNOSIS — E039 Hypothyroidism, unspecified: Secondary | ICD-10-CM

## 2014-05-05 DIAGNOSIS — D538 Other specified nutritional anemias: Secondary | ICD-10-CM

## 2014-05-05 DIAGNOSIS — E669 Obesity, unspecified: Secondary | ICD-10-CM

## 2014-05-05 DIAGNOSIS — Z0000 Encounter for general adult medical examination without abnormal findings: Secondary | ICD-10-CM

## 2014-05-05 DIAGNOSIS — M1990 Unspecified osteoarthritis, unspecified site: Secondary | ICD-10-CM

## 2014-05-05 DIAGNOSIS — I10 Essential (primary) hypertension: Secondary | ICD-10-CM

## 2014-05-05 LAB — COMPREHENSIVE METABOLIC PANEL
ALT: 52 U/L (ref 0–53)
AST: 45 U/L — ABNORMAL HIGH (ref 0–37)
Albumin: 4.1 g/dL (ref 3.5–5.2)
Alkaline Phosphatase: 92 U/L (ref 39–117)
BUN: 18 mg/dL (ref 6–23)
CO2: 25 mEq/L (ref 19–32)
Calcium: 9.1 mg/dL (ref 8.4–10.5)
Chloride: 109 mEq/L (ref 96–112)
Creatinine, Ser: 0.6 mg/dL (ref 0.4–1.5)
GFR: 149.51 mL/min (ref >60.00)
Glucose, Bld: 98 mg/dL (ref 70–99)
Potassium: 4.3 mEq/L (ref 3.5–5.1)
Sodium: 141 mEq/L (ref 135–145)
Total Bilirubin: 0.5 mg/dL (ref 0.2–1.2)
Total Protein: 7 g/dL (ref 6.0–8.3)

## 2014-05-05 LAB — CBC WITH DIFFERENTIAL/PLATELET
Basophils Absolute: 0 10*3/uL (ref 0.0–0.1)
Basophils Relative: 0.5 % (ref 0.0–3.0)
Eosinophils Absolute: 0.2 10*3/uL (ref 0.0–0.7)
Eosinophils Relative: 2.3 % (ref 0.0–5.0)
HCT: 39.5 % (ref 39.0–52.0)
Hemoglobin: 13.2 g/dL (ref 13.0–17.0)
Lymphocytes Relative: 35.3 % (ref 12.0–46.0)
Lymphs Abs: 3.1 10*3/uL (ref 0.7–4.0)
MCHC: 33.5 g/dL (ref 30.0–36.0)
MCV: 91.8 fl (ref 78.0–100.0)
Monocytes Absolute: 1 10*3/uL (ref 0.1–1.0)
Monocytes Relative: 11.1 % (ref 3.0–12.0)
Neutro Abs: 4.5 10*3/uL (ref 1.4–7.7)
Neutrophils Relative %: 50.8 % (ref 43.0–77.0)
Platelets: 200 10*3/uL (ref 150.0–400.0)
RBC: 4.3 Mil/uL (ref 4.22–5.81)
RDW: 15.1 % (ref 11.5–15.5)
WBC: 8.9 10*3/uL (ref 4.0–10.5)

## 2014-05-05 LAB — LIPID PANEL
Cholesterol: 179 mg/dL (ref 0–200)
HDL: 49 mg/dL (ref >39.00)
LDL Cholesterol: 75 mg/dL (ref 0–99)
NonHDL: 130
Total CHOL/HDL Ratio: 4
Triglycerides: 277 mg/dL — ABNORMAL HIGH (ref 0.0–149.0)
VLDL: 55.4 mg/dL — ABNORMAL HIGH (ref 0.0–40.0)

## 2014-05-05 LAB — PSA: PSA: 0.36 ng/mL (ref 0.10–4.00)

## 2014-05-05 LAB — FOLATE: Folate: 7.2 ng/mL (ref >5.9)

## 2014-05-05 LAB — IBC PANEL
Iron: 48 ug/dL (ref 42–165)
Saturation Ratios: 12.4 % — ABNORMAL LOW (ref 20.0–50.0)
Transferrin: 276.5 mg/dL (ref 212.0–360.0)

## 2014-05-05 LAB — FERRITIN: Ferritin: 24.9 ng/mL (ref 22.0–322.0)

## 2014-05-05 LAB — HEPATITIS C ANTIBODY: HCV Ab: NEGATIVE

## 2014-05-05 LAB — VITAMIN B12: Vitamin B-12: 361 pg/mL (ref 211–911)

## 2014-05-05 LAB — FECAL OCCULT BLOOD, GUAIAC: Fecal Occult Blood: NEGATIVE

## 2014-05-05 LAB — VITAMIN D 25 HYDROXY (VIT D DEFICIENCY, FRACTURES): VITD: 23.26 ng/mL

## 2014-05-05 LAB — TSH: TSH: 1.54 u[IU]/mL (ref 0.35–4.50)

## 2014-05-05 LAB — HEMOGLOBIN A1C: Hgb A1c MFr Bld: 6 % (ref 4.6–6.5)

## 2014-05-05 NOTE — Assessment & Plan Note (Signed)
FLP today Thus far, his LDL has been normal

## 2014-05-05 NOTE — Assessment & Plan Note (Signed)
I will check his TSH today and will adjust his dose if needed

## 2014-05-05 NOTE — Assessment & Plan Note (Signed)
I will recheck his CBC today and will check his vitamin levels as well

## 2014-05-05 NOTE — Assessment & Plan Note (Signed)
Exam done Vaccines were reviewed Labs ordered Pt ed material was given 

## 2014-05-05 NOTE — Assessment & Plan Note (Signed)
His BP is well controlled I will monitor his lytes and renal function today 

## 2014-05-05 NOTE — Assessment & Plan Note (Signed)
I will check his A1C to see if he has developed DM2 

## 2014-05-05 NOTE — Assessment & Plan Note (Signed)
This appears to have been related to EtOH abuse He has been abstinent for 6 weeks  I will recheck his LFT's today and will screen for Hep C as well

## 2014-05-05 NOTE — Progress Notes (Signed)
Pre visit review using our clinic review tool, if applicable. No additional management support is needed unless otherwise documented below in the visit note. 

## 2014-05-05 NOTE — Patient Instructions (Signed)
Health Maintenance A healthy lifestyle and preventative care can promote health and wellness.  Maintain regular health, dental, and eye exams.  Eat a healthy diet. Foods like vegetables, fruits, whole grains, low-fat dairy products, and lean protein foods contain the nutrients you need and are low in calories. Decrease your intake of foods high in solid fats, added sugars, and salt. Get information about a proper diet from your health care provider, if necessary.  Regular physical exercise is one of the most important things you can do for your health. Most adults should get at least 150 minutes of moderate-intensity exercise (any activity that increases your heart rate and causes you to sweat) each week. In addition, most adults need muscle-strengthening exercises on 2 or more days a week.   Maintain a healthy weight. The body mass index (BMI) is a screening tool to identify possible weight problems. It provides an estimate of body fat based on height and weight. Your health care provider can find your BMI and can help you achieve or maintain a healthy weight. For males 20 years and older:  A BMI below 18.5 is considered underweight.  A BMI of 18.5 to 24.9 is normal.  A BMI of 25 to 29.9 is considered overweight.  A BMI of 30 and above is considered obese.  Maintain normal blood lipids and cholesterol by exercising and minimizing your intake of saturated fat. Eat a balanced diet with plenty of fruits and vegetables. Blood tests for lipids and cholesterol should begin at age 20 and be repeated every 5 years. If your lipid or cholesterol levels are high, you are over age 50, or you are at high risk for heart disease, you may need your cholesterol levels checked more frequently.Ongoing high lipid and cholesterol levels should be treated with medicines if diet and exercise are not working.  If you smoke, find out from your health care provider how to quit. If you do not use tobacco, do not  start.  Lung cancer screening is recommended for adults aged 55-80 years who are at high risk for developing lung cancer because of a history of smoking. A yearly low-dose CT scan of the lungs is recommended for people who have at least a 30-pack-year history of smoking and are current smokers or have quit within the past 15 years. A pack year of smoking is smoking an average of 1 pack of cigarettes a day for 1 year (for example, a 30-pack-year history of smoking could mean smoking 1 pack a day for 30 years or 2 packs a day for 15 years). Yearly screening should continue until the smoker has stopped smoking for at least 15 years. Yearly screening should be stopped for people who develop a health problem that would prevent them from having lung cancer treatment.  If you choose to drink alcohol, do not have more than 2 drinks per day. One drink is considered to be 12 oz (360 mL) of beer, 5 oz (150 mL) of wine, or 1.5 oz (45 mL) of liquor.  Avoid the use of street drugs. Do not share needles with anyone. Ask for help if you need support or instructions about stopping the use of drugs.  High blood pressure causes heart disease and increases the risk of stroke. Blood pressure should be checked at least every 1-2 years. Ongoing high blood pressure should be treated with medicines if weight loss and exercise are not effective.  If you are 45-79 years old, ask your health care provider if   you should take aspirin to prevent heart disease.  Diabetes screening involves taking a blood sample to check your fasting blood sugar level. This should be done once every 3 years after age 45 if you are at a normal weight and without risk factors for diabetes. Testing should be considered at a younger age or be carried out more frequently if you are overweight and have at least 1 risk factor for diabetes.  Colorectal cancer can be detected and often prevented. Most routine colorectal cancer screening begins at the age of 50  and continues through age 75. However, your health care provider may recommend screening at an earlier age if you have risk factors for colon cancer. On a yearly basis, your health care provider may provide home test kits to check for hidden blood in the stool. A small camera at the end of a tube may be used to directly examine the colon (sigmoidoscopy or colonoscopy) to detect the earliest forms of colorectal cancer. Talk to your health care provider about this at age 50 when routine screening begins. A direct exam of the colon should be repeated every 5-10 years through age 75, unless early forms of precancerous polyps or small growths are found.  People who are at an increased risk for hepatitis B should be screened for this virus. You are considered at high risk for hepatitis B if:  You were born in a country where hepatitis B occurs often. Talk with your health care provider about which countries are considered high risk.  Your parents were born in a high-risk country and you have not received a shot to protect against hepatitis B (hepatitis B vaccine).  You have HIV or AIDS.  You use needles to inject street drugs.  You live with, or have sex with, someone who has hepatitis B.  You are a man who has sex with other men (MSM).  You get hemodialysis treatment.  You take certain medicines for conditions like cancer, organ transplantation, and autoimmune conditions.  Hepatitis C blood testing is recommended for all people born from 1945 through 1965 and any individual with known risk factors for hepatitis C.  Healthy men should no longer receive prostate-specific antigen (PSA) blood tests as part of routine cancer screening. Talk to your health care provider about prostate cancer screening.  Testicular cancer screening is not recommended for adolescents or adult males who have no symptoms. Screening includes self-exam, a health care provider exam, and other screening tests. Consult with your  health care provider about any symptoms you have or any concerns you have about testicular cancer.  Practice safe sex. Use condoms and avoid high-risk sexual practices to reduce the spread of sexually transmitted infections (STIs).  You should be screened for STIs, including gonorrhea and chlamydia if:  You are sexually active and are younger than 24 years.  You are older than 24 years, and your health care provider tells you that you are at risk for this type of infection.  Your sexual activity has changed since you were last screened, and you are at an increased risk for chlamydia or gonorrhea. Ask your health care provider if you are at risk.  If you are at risk of being infected with HIV, it is recommended that you take a prescription medicine daily to prevent HIV infection. This is called pre-exposure prophylaxis (PrEP). You are considered at risk if:  You are a man who has sex with other men (MSM).  You are a heterosexual man who   is sexually active with multiple partners.  You take drugs by injection.  You are sexually active with a partner who has HIV.  Talk with your health care provider about whether you are at high risk of being infected with HIV. If you choose to begin PrEP, you should first be tested for HIV. You should then be tested every 3 months for as long as you are taking PrEP.  Use sunscreen. Apply sunscreen liberally and repeatedly throughout the day. You should seek shade when your shadow is shorter than you. Protect yourself by wearing long sleeves, pants, a wide-brimmed hat, and sunglasses year round whenever you are outdoors.  Tell your health care provider of new moles or changes in moles, especially if there is a change in shape or color. Also, tell your health care provider if a mole is larger than the size of a pencil eraser.  A one-time screening for abdominal aortic aneurysm (AAA) and surgical repair of large AAAs by ultrasound is recommended for men aged  48-75 years who are current or former smokers.  Stay current with your vaccines (immunizations). Document Released: 04/24/2008 Document Revised: 11/01/2013 Document Reviewed: 03/24/2011 [facility] Patient Information 2015 [place], [place]. This information is not intended to replace advice given to you by your health care provider. Make sure you discuss any questions you have with your health care provider. Anemia, Nonspecific Anemia is a condition in which the concentration of red blood cells or hemoglobin in the blood is below normal. Hemoglobin is a substance in red blood cells that carries oxygen to the tissues of the body. Anemia results in not enough oxygen reaching these tissues.  CAUSES  Common causes of anemia include:   Excessive bleeding. Bleeding may be internal or external. This includes excessive bleeding from periods (in women) or from the intestine.   Poor nutrition.   Chronic kidney, thyroid, and liver disease.  Bone marrow disorders that decrease red blood cell production.  Cancer and treatments for cancer.  HIV, AIDS, and their treatments.  Spleen problems that increase red blood cell destruction.  Blood disorders.  Excess destruction of red blood cells due to infection, medicines, and autoimmune disorders. SIGNS AND SYMPTOMS   Minor weakness.   Dizziness.   Headache.  Palpitations.   Shortness of breath, especially with exercise.   Paleness.  Cold sensitivity.  Indigestion.  Nausea.  Difficulty sleeping.  Difficulty concentrating. Symptoms may occur suddenly or they may develop slowly.  DIAGNOSIS  Additional blood tests are often needed. These help your health care provider determine the best treatment. Your health care provider will check your stool for blood and look for other causes of blood loss.  TREATMENT  Treatment varies depending on the cause of the anemia. Treatment can include:   Supplements of iron, vitamin U72, or folic  acid.   Hormone medicines.   A blood transfusion. This may be needed if blood loss is severe.   Hospitalization. This may be needed if there is significant continual blood loss.   Dietary changes.  Spleen removal. HOME CARE INSTRUCTIONS Keep all follow-up appointments. It often takes many weeks to correct anemia, and having your health care provider check on your condition and your response to treatment is very important. SEEK IMMEDIATE MEDICAL CARE IF:   You develop extreme weakness, shortness of breath, or chest pain.   You become dizzy or have trouble concentrating.  You develop heavy vaginal bleeding.   You develop a rash.   You have bloody or black,  tarry stools.   You faint.   You vomit up blood.   You vomit repeatedly.   You have abdominal pain.  You have a fever or persistent symptoms for more than 2-3 days.   You have a fever and your symptoms suddenly get worse.   You are dehydrated.  MAKE SURE YOU:  Understand these instructions.  Will watch your condition.  Will get help right away if you are not doing well or get worse. Document Released: 12/04/2004 Document Revised: 06/29/2013 Document Reviewed: 04/22/2013 [facility] Patient Information 2015 [place], [place]. This information is not intended to replace advice given to you by your health care provider. Make sure you discuss any questions you have with your health care provider.

## 2014-05-05 NOTE — Progress Notes (Signed)
Subjective:    Patient ID: [name] [name], male    DOB: August 13, 1956, 58 y.o.   MRN: [id number]  Anemia Presents for follow-up visit. There has been no abdominal pain, anorexia, bruising/bleeding easily, confusion, fever, leg swelling, light-headedness, malaise/fatigue, pallor, palpitations, paresthesias, pica or weight loss. Signs of blood loss that are not present include hematemesis, hematochezia and melena. Past treatments include nothing.      Review of Systems  Constitutional: Negative.  Negative for fever, chills, weight loss, malaise/fatigue, diaphoresis, appetite change and fatigue.  HENT: Negative.   Eyes: Negative.   Respiratory: Negative.  Negative for apnea, cough, choking, chest tightness, shortness of breath, wheezing and stridor.   Cardiovascular: Negative.  Negative for chest pain, palpitations and leg swelling.  Gastrointestinal: Negative.  Negative for nausea, vomiting, abdominal pain, diarrhea, constipation, blood in stool, melena, hematochezia, anorexia and hematemesis.  Endocrine: Negative.  Negative for polydipsia, polyphagia and polyuria.  Genitourinary: Negative.   Musculoskeletal: Negative.  Negative for arthralgias and back pain.  Skin: Negative.  Negative for pallor and rash.  Allergic/Immunologic: Negative.   Neurological: Negative.  Negative for dizziness, tremors, seizures, syncope, facial asymmetry, speech difficulty, weakness, light-headedness, numbness, headaches and paresthesias.  Hematological: Negative.  Negative for adenopathy. Does not bruise/bleed easily.  Psychiatric/Behavioral: Negative.  Negative for confusion.       Objective:   Physical Exam  Vitals reviewed. Constitutional: He is oriented to person, place, and time. He appears well-developed and well-nourished. No distress.  HENT:  Head: Normocephalic and atraumatic.  Mouth/Throat: Oropharynx is clear and moist. No oropharyngeal exudate.  Eyes: Conjunctivae are normal. Right eye  exhibits no discharge. Left eye exhibits no discharge. No scleral icterus.  Neck: Normal range of motion. Neck supple. No JVD present. No tracheal deviation present. No thyromegaly present.  Cardiovascular: Normal rate, regular rhythm, normal heart sounds and intact distal pulses.  Exam reveals no gallop and no friction rub.   No murmur heard. Pulmonary/Chest: Effort normal and breath sounds normal. No stridor. No respiratory distress. He has no wheezes. He has no rales. He exhibits no tenderness.  Abdominal: Soft. Bowel sounds are normal. He exhibits no distension and no mass. There is no tenderness. There is no rebound and no guarding. Hernia confirmed negative in the right inguinal area and confirmed negative in the left inguinal area.  Genitourinary: Rectum normal, prostate normal, testes normal and penis normal. Rectal exam shows no external hemorrhoid, no internal hemorrhoid, no fissure, no mass, no tenderness and anal tone normal. Guaiac negative stool. Prostate is not enlarged and not tender. Right testis shows no mass, no swelling and no tenderness. Right testis is descended. Left testis shows no mass, no swelling and no tenderness. Left testis is descended. Circumcised. No penile erythema or penile tenderness. No discharge found.  Musculoskeletal: Normal range of motion. He exhibits no edema and no tenderness.  Lymphadenopathy:    He has no cervical adenopathy.       Right: No inguinal adenopathy present.       Left: No inguinal adenopathy present.  Neurological: He is oriented to person, place, and time.  Skin: Skin is warm and dry. No rash noted. He is not diaphoretic. No erythema. No pallor.  Psychiatric: He has a normal mood and affect. His behavior is normal. Judgment and thought content normal.     Lab Results  Component Value Date   WBC 7.1 03/27/2014   HGB 13.1 03/27/2014   HCT 38.0* 03/27/2014   PLT 152 03/27/2014  GLUCOSE 170* 03/27/2014   CHOL 166 12/10/2012   TRIG 169.0*  12/10/2012   HDL 71.50 12/10/2012   LDLDIRECT 70.6 03/24/2012   LDLCALC 61 12/10/2012   ALT 240* 03/27/2014   AST 176* 03/27/2014   NA 136* 03/27/2014   K 4.6 03/27/2014   CL 95* 03/27/2014   CREATININE 0.62 03/27/2014   BUN 9 03/27/2014   CO2 31 03/27/2014   TSH 1.980 03/25/2014   PSA 0.22 04/02/2009   INR 0.88 03/25/2014   HGBA1C 6.0 12/10/2012   MICROALBUR 4.2* 12/30/2007       Assessment & Plan:

## 2014-05-10 ENCOUNTER — Encounter: Payer: Self-pay | Admitting: Internal Medicine

## 2014-05-10 DIAGNOSIS — E519 Thiamine deficiency, unspecified: Secondary | ICD-10-CM | POA: Insufficient documentation

## 2014-05-10 DIAGNOSIS — E559 Vitamin D deficiency, unspecified: Secondary | ICD-10-CM | POA: Insufficient documentation

## 2014-05-10 LAB — VITAMIN B1: Vitamin B1 (Thiamine): 8 nmol/L (ref 8–30)

## 2014-05-10 LAB — VITAMIN B6: Vitamin B6: 45 ng/mL — ABNORMAL HIGH (ref 2.1–21.7)

## 2014-05-10 MED ORDER — CHOLECALCIFEROL 1.25 MG (50000 UT) PO TABS: 1.0000 | ORAL_TABLET | ORAL | Status: DC

## 2014-05-10 MED ORDER — VITAMIN B-1 100 MG PO TABS: 100.0000 mg | ORAL_TABLET | Freq: Every day | ORAL | Status: DC

## 2014-05-10 NOTE — Addendum Note (Signed)
Addended by: Janith Lima on: 05/10/2014 12:01 PM   Modules accepted: Orders

## 2014-05-17 ENCOUNTER — Other Ambulatory Visit: Payer: Self-pay

## 2014-05-17 ENCOUNTER — Other Ambulatory Visit: Payer: Self-pay | Admitting: Internal Medicine

## 2014-05-17 MED ORDER — PANTOPRAZOLE SODIUM 40 MG PO TBEC: 40.0000 mg | DELAYED_RELEASE_TABLET | Freq: Every day | ORAL | Status: DC

## 2014-07-13 ENCOUNTER — Ambulatory Visit: Payer: BC Managed Care – PPO | Admitting: Internal Medicine

## 2014-08-18 ENCOUNTER — Other Ambulatory Visit: Payer: Self-pay

## 2014-08-18 MED ORDER — TADALAFIL 20 MG PO TABS: 20.0000 mg | ORAL_TABLET | Freq: Every day | ORAL | Status: DC | PRN

## 2014-08-22 ENCOUNTER — Other Ambulatory Visit: Payer: Self-pay | Admitting: Internal Medicine

## 2014-08-22 ENCOUNTER — Telehealth: Payer: Self-pay | Admitting: *Deleted

## 2014-08-22 MED ORDER — TRIAZOLAM 0.25 MG PO TABS: 0.2500 mg | ORAL_TABLET | Freq: Every evening | ORAL | Status: DC | PRN

## 2014-08-22 NOTE — Telephone Encounter (Signed)
Left msg on triage requesting refill on pt triazolam. Is this ok...Johny Chess

## 2014-08-22 NOTE — Telephone Encounter (Signed)
yes

## 2014-08-23 ENCOUNTER — Other Ambulatory Visit: Payer: Self-pay | Admitting: *Deleted

## 2014-08-23 NOTE — Telephone Encounter (Signed)
MD close previous msg see below  Janith Lima, MD at 08/22/2014 5:19 PM    Status: Signed       yes       Earnstine Regal, MA at 08/22/2014 2:40 PM     Status: Signed        Left msg on triage requesting refill on pt triazolam. Is this ok.../lmb    Called refill [facility] into [facility] updated EPIC...Johny Chess

## 2014-09-06 ENCOUNTER — Encounter: Payer: Self-pay | Admitting: Internal Medicine

## 2014-09-06 ENCOUNTER — Ambulatory Visit (INDEPENDENT_AMBULATORY_CARE_PROVIDER_SITE_OTHER): Payer: BC Managed Care – PPO | Admitting: Internal Medicine

## 2014-09-06 ENCOUNTER — Other Ambulatory Visit (INDEPENDENT_AMBULATORY_CARE_PROVIDER_SITE_OTHER): Payer: BC Managed Care – PPO

## 2014-09-06 VITALS — BP 118/70 | HR 64 | Temp 97.8°F | Resp 16 | Ht 74.0 in | Wt 255.0 lb

## 2014-09-06 DIAGNOSIS — D538 Other specified nutritional anemias: Secondary | ICD-10-CM

## 2014-09-06 DIAGNOSIS — E559 Vitamin D deficiency, unspecified: Secondary | ICD-10-CM

## 2014-09-06 DIAGNOSIS — E038 Other specified hypothyroidism: Secondary | ICD-10-CM

## 2014-09-06 DIAGNOSIS — I10 Essential (primary) hypertension: Secondary | ICD-10-CM

## 2014-09-06 DIAGNOSIS — E519 Thiamine deficiency, unspecified: Secondary | ICD-10-CM

## 2014-09-06 LAB — CBC WITH DIFFERENTIAL/PLATELET
Basophils Absolute: 0 10*3/uL (ref 0.0–0.1)
Basophils Relative: 0.3 % (ref 0.0–3.0)
Eosinophils Absolute: 0.2 10*3/uL (ref 0.0–0.7)
Eosinophils Relative: 2.2 % (ref 0.0–5.0)
HCT: 40.2 % (ref 39.0–52.0)
Hemoglobin: 13.1 g/dL (ref 13.0–17.0)
Lymphocytes Relative: 27.6 % (ref 12.0–46.0)
Lymphs Abs: 2.6 10*3/uL (ref 0.7–4.0)
MCHC: 32.5 g/dL (ref 30.0–36.0)
MCV: 85.4 fl (ref 78.0–100.0)
Monocytes Absolute: 0.7 10*3/uL (ref 0.1–1.0)
Monocytes Relative: 8 % (ref 3.0–12.0)
Neutro Abs: 5.7 10*3/uL (ref 1.4–7.7)
Neutrophils Relative %: 61.9 % (ref 43.0–77.0)
Platelets: 160 10*3/uL (ref 150.0–400.0)
RBC: 4.71 Mil/uL (ref 4.22–5.81)
RDW: 14.3 % (ref 11.5–15.5)
WBC: 9.2 10*3/uL (ref 4.0–10.5)

## 2014-09-06 LAB — COMPREHENSIVE METABOLIC PANEL
ALT: 23 U/L (ref 0–53)
AST: 30 U/L (ref 0–37)
Albumin: 3.6 g/dL (ref 3.5–5.2)
Alkaline Phosphatase: 108 U/L (ref 39–117)
BUN: 16 mg/dL (ref 6–23)
CO2: 21 mEq/L (ref 19–32)
Calcium: 9.2 mg/dL (ref 8.4–10.5)
Chloride: 108 mEq/L (ref 96–112)
Creatinine, Ser: 0.8 mg/dL (ref 0.4–1.5)
GFR: 106.63 mL/min (ref >60.00)
Glucose, Bld: 100 mg/dL — ABNORMAL HIGH (ref 70–99)
Potassium: 4.2 mEq/L (ref 3.5–5.1)
Sodium: 141 mEq/L (ref 135–145)
Total Bilirubin: 0.4 mg/dL (ref 0.2–1.2)
Total Protein: 7.3 g/dL (ref 6.0–8.3)

## 2014-09-06 LAB — VITAMIN D 25 HYDROXY (VIT D DEFICIENCY, FRACTURES): VITD: 79.13 ng/mL (ref 30.00–100.00)

## 2014-09-06 LAB — TSH: TSH: 1.6 u[IU]/mL (ref 0.35–4.50)

## 2014-09-06 NOTE — Assessment & Plan Note (Signed)
His BP is well controlled Lytes and renal function are stable 

## 2014-09-06 NOTE — Assessment & Plan Note (Signed)
His H and H are normal now

## 2014-09-06 NOTE — Assessment & Plan Note (Signed)
His VIt D level is on the normal range now

## 2014-09-06 NOTE — Patient Instructions (Signed)

## 2014-09-06 NOTE — Progress Notes (Signed)
Pre visit review using our clinic review tool, if applicable. No additional management support is needed unless otherwise documented below in the visit note. 

## 2014-09-06 NOTE — Progress Notes (Signed)
Subjective:    Patient ID: [name] [name], male    DOB: August 14, 1956, 58 y.o.   MRN: [id number]  Hypertension This is a chronic problem. The current episode started more than 1 year ago. The problem has been gradually improving since onset. The problem is controlled. Pertinent negatives include no anxiety, blurred vision, chest pain, headaches, malaise/fatigue, neck pain, orthopnea, palpitations, peripheral edema, PND, shortness of breath or sweats. Agents associated with hypertension include decongestants. Past treatments include calcium channel blockers and ACE inhibitors. The current treatment provides moderate improvement. There are no compliance problems.       Review of Systems  Constitutional: Negative.  Negative for fever, chills, malaise/fatigue, diaphoresis, appetite change and fatigue.  HENT: Negative.   Eyes: Negative.  Negative for blurred vision.  Respiratory: Negative.  Negative for cough, choking, chest tightness, shortness of breath and stridor.   Cardiovascular: Negative.  Negative for chest pain, palpitations, orthopnea, leg swelling and PND.  Gastrointestinal: Negative.  Negative for nausea, vomiting, abdominal pain, diarrhea, constipation and blood in stool.  Endocrine: Negative.  Negative for polydipsia, polyphagia and polyuria.  Genitourinary: Negative.   Musculoskeletal: Negative.  Negative for neck pain.  Skin: Negative.   Allergic/Immunologic: Negative.   Neurological: Negative.  Negative for dizziness, syncope, speech difficulty, weakness, light-headedness, numbness and headaches.  Hematological: Negative.  Negative for adenopathy. Does not bruise/bleed easily.  Psychiatric/Behavioral: Negative.        Objective:   Physical Exam  Vitals reviewed. Constitutional: He is oriented to person, place, and time. He appears well-developed and well-nourished. No distress.  HENT:  Head: Normocephalic and atraumatic.  Mouth/Throat: Oropharynx is clear and moist. No  oropharyngeal exudate.  Eyes: Conjunctivae are normal. Right eye exhibits no discharge. Left eye exhibits no discharge. No scleral icterus.  Neck: Normal range of motion. Neck supple. No JVD present. No tracheal deviation present. No thyromegaly present.  Cardiovascular: Normal rate, regular rhythm, normal heart sounds and intact distal pulses.  Exam reveals no gallop and no friction rub.   No murmur heard. Pulmonary/Chest: Effort normal and breath sounds normal. No stridor. No respiratory distress. He has no wheezes. He has no rales. He exhibits no tenderness.  Abdominal: Soft. Bowel sounds are normal. He exhibits no distension and no mass. There is no tenderness. There is no rebound and no guarding.  Musculoskeletal: Normal range of motion. He exhibits no edema and no tenderness.  Lymphadenopathy:    He has no cervical adenopathy.  Neurological: He is oriented to person, place, and time.  Skin: Skin is warm and dry. No rash noted. He is not diaphoretic. No erythema. No pallor.  Psychiatric: He has a normal mood and affect. His behavior is normal. Judgment and thought content normal.    Lab Results  Component Value Date   WBC 8.9 05/05/2014   HGB 13.2 05/05/2014   HCT 39.5 05/05/2014   PLT 200.0 05/05/2014   GLUCOSE 98 05/05/2014   CHOL 179 05/05/2014   TRIG 277.0* 05/05/2014   HDL 49.00 05/05/2014   LDLDIRECT 70.6 03/24/2012   LDLCALC 75 05/05/2014   ALT 52 05/05/2014   AST 45* 05/05/2014   NA 141 05/05/2014   K 4.3 05/05/2014   CL 109 05/05/2014   CREATININE 0.6 05/05/2014   BUN 18 05/05/2014   CO2 25 05/05/2014   TSH 1.54 05/05/2014   PSA 0.36 05/05/2014   INR 0.88 03/25/2014   HGBA1C 6.0 05/05/2014   MICROALBUR 4.2* 12/30/2007  Assessment & Plan:

## 2014-09-06 NOTE — Assessment & Plan Note (Signed)
I will recheck her B1 level today and will advise if needed

## 2014-09-07 ENCOUNTER — Telehealth: Payer: Self-pay | Admitting: Internal Medicine

## 2014-09-07 NOTE — Telephone Encounter (Signed)
emmi mailed  °

## 2014-09-10 ENCOUNTER — Encounter: Payer: Self-pay | Admitting: Internal Medicine

## 2014-09-10 LAB — VITAMIN B1: Vitamin B1 (Thiamine): 65 nmol/L — ABNORMAL HIGH (ref 8–30)

## 2014-10-31 ENCOUNTER — Other Ambulatory Visit: Payer: Self-pay | Admitting: Internal Medicine

## 2014-12-07 ENCOUNTER — Encounter: Payer: Self-pay | Admitting: Gastroenterology

## 2015-02-12 ENCOUNTER — Telehealth: Payer: Self-pay | Admitting: Internal Medicine

## 2015-02-12 NOTE — Telephone Encounter (Signed)
Disregard

## 2015-02-13 ENCOUNTER — Other Ambulatory Visit: Payer: Self-pay | Admitting: Internal Medicine

## 2015-02-13 ENCOUNTER — Telehealth: Payer: Self-pay | Admitting: Internal Medicine

## 2015-02-13 ENCOUNTER — Ambulatory Visit (INDEPENDENT_AMBULATORY_CARE_PROVIDER_SITE_OTHER): Payer: BLUE CROSS/BLUE SHIELD | Admitting: Internal Medicine

## 2015-02-13 ENCOUNTER — Other Ambulatory Visit (INDEPENDENT_AMBULATORY_CARE_PROVIDER_SITE_OTHER): Payer: BLUE CROSS/BLUE SHIELD

## 2015-02-13 ENCOUNTER — Encounter: Payer: Self-pay | Admitting: Internal Medicine

## 2015-02-13 VITALS — BP 124/82 | HR 81 | Temp 97.7°F | Resp 18 | Wt 272.0 lb

## 2015-02-13 DIAGNOSIS — E519 Thiamine deficiency, unspecified: Secondary | ICD-10-CM

## 2015-02-13 DIAGNOSIS — Z9889 Other specified postprocedural states: Secondary | ICD-10-CM | POA: Insufficient documentation

## 2015-02-13 DIAGNOSIS — D538 Other specified nutritional anemias: Secondary | ICD-10-CM | POA: Diagnosis not present

## 2015-02-13 DIAGNOSIS — M170 Bilateral primary osteoarthritis of knee: Secondary | ICD-10-CM

## 2015-02-13 DIAGNOSIS — E038 Other specified hypothyroidism: Secondary | ICD-10-CM

## 2015-02-13 DIAGNOSIS — E559 Vitamin D deficiency, unspecified: Secondary | ICD-10-CM

## 2015-02-13 DIAGNOSIS — I10 Essential (primary) hypertension: Secondary | ICD-10-CM

## 2015-02-13 DIAGNOSIS — K219 Gastro-esophageal reflux disease without esophagitis: Secondary | ICD-10-CM | POA: Diagnosis not present

## 2015-02-13 DIAGNOSIS — R739 Hyperglycemia, unspecified: Secondary | ICD-10-CM | POA: Diagnosis not present

## 2015-02-13 DIAGNOSIS — Z9884 Bariatric surgery status: Secondary | ICD-10-CM

## 2015-02-13 DIAGNOSIS — Z1211 Encounter for screening for malignant neoplasm of colon: Secondary | ICD-10-CM

## 2015-02-13 LAB — CBC WITH DIFFERENTIAL/PLATELET
Basophils Absolute: 0 10*3/uL (ref 0.0–0.1)
Basophils Relative: 0.5 % (ref 0.0–3.0)
Eosinophils Absolute: 0.2 10*3/uL (ref 0.0–0.7)
Eosinophils Relative: 2 % (ref 0.0–5.0)
HCT: 43.1 % (ref 39.0–52.0)
Hemoglobin: 14.3 g/dL (ref 13.0–17.0)
Lymphocytes Relative: 34.3 % (ref 12.0–46.0)
Lymphs Abs: 3.4 10*3/uL (ref 0.7–4.0)
MCHC: 33.1 g/dL (ref 30.0–36.0)
MCV: 83.2 fl (ref 78.0–100.0)
Monocytes Absolute: 0.9 10*3/uL (ref 0.1–1.0)
Monocytes Relative: 8.9 % (ref 3.0–12.0)
Neutro Abs: 5.4 10*3/uL (ref 1.4–7.7)
Neutrophils Relative %: 54.3 % (ref 43.0–77.0)
Platelets: 161 10*3/uL (ref 150.0–400.0)
RBC: 5.18 Mil/uL (ref 4.22–5.81)
RDW: 14.3 % (ref 11.5–15.5)
WBC: 9.9 10*3/uL (ref 4.0–10.5)

## 2015-02-13 LAB — BASIC METABOLIC PANEL
BUN: 15 mg/dL (ref 6–23)
CO2: 27 mEq/L (ref 19–32)
Calcium: 9.5 mg/dL (ref 8.4–10.5)
Chloride: 104 mEq/L (ref 96–112)
Creatinine, Ser: 0.8 mg/dL (ref 0.40–1.50)
GFR: 104.93 mL/min (ref >60.00)
Glucose, Bld: 113 mg/dL — ABNORMAL HIGH (ref 70–99)
Potassium: 4.4 mEq/L (ref 3.5–5.1)
Sodium: 138 mEq/L (ref 135–145)

## 2015-02-13 LAB — TSH: TSH: 3.5 u[IU]/mL (ref 0.35–4.50)

## 2015-02-13 LAB — HEMOGLOBIN A1C: Hgb A1c MFr Bld: 6.2 % (ref 4.6–6.5)

## 2015-02-13 LAB — VITAMIN D 25 HYDROXY (VIT D DEFICIENCY, FRACTURES): VITD: 92.02 ng/mL (ref 30.00–100.00)

## 2015-02-13 MED ORDER — TURMERIC 450 MG PO CAPS: 1.0000 | ORAL_CAPSULE | Freq: Two times a day (BID) | ORAL | Status: AC

## 2015-02-13 MED ORDER — TRIAZOLAM 0.25 MG PO TABS: 0.2500 mg | ORAL_TABLET | Freq: Every evening | ORAL | Status: DC | PRN

## 2015-02-13 MED ORDER — TADALAFIL 20 MG PO TABS: 20.0000 mg | ORAL_TABLET | Freq: Every day | ORAL | Status: DC | PRN

## 2015-02-13 MED ORDER — LORATADINE-PSEUDOEPHEDRINE ER 10-240 MG PO TB24: 1.0000 | ORAL_TABLET | Freq: Every day | ORAL | Status: DC | PRN

## 2015-02-13 NOTE — Patient Instructions (Signed)

## 2015-02-13 NOTE — Progress Notes (Signed)
Pre visit review using our clinic review tool, if applicable. No additional management support is needed unless otherwise documented below in the visit note. 

## 2015-02-13 NOTE — Telephone Encounter (Signed)
Pt called in said that [facility] stated that he is not able to fill his triazolam (HALCION) 0.25 MG tablet [[id number].  They did not tell him why, she wanted a nurse to call and see what is going on with his script.  He stated it was just this one he was having the problem with.

## 2015-02-14 NOTE — Assessment & Plan Note (Signed)
He has prediabetes Will work on his regimen for exercise and weight loss

## 2015-02-14 NOTE — Progress Notes (Signed)
Subjective:    Patient ID: [name] [name], male    DOB: 06-Nov-1956, 59 y.o.   MRN: [id number]  Hypertension Pertinent negatives include no chest pain, palpitations or shortness of breath. Hypertensive end-organ damage includes a thyroid problem.  Thyroid Problem Presents for follow-up visit. Symptoms include weight gain. Patient reports no anxiety, cold intolerance, constipation, depressed mood, diaphoresis, diarrhea, dry skin, fatigue, hair loss, heat intolerance, hoarse voice, leg swelling, nail problem, palpitations, tremors, visual change or weight loss. The symptoms have been stable. Past treatments include levothyroxine. The treatment provided moderate relief. His past medical history is significant for obesity.      Review of Systems  Constitutional: Positive for weight gain and unexpected weight change. Negative for fever, chills, weight loss, diaphoresis and fatigue.  HENT: Negative.  Negative for hoarse voice.   Eyes: Negative.   Respiratory: Negative.  Negative for cough, choking, chest tightness, shortness of breath and stridor.   Cardiovascular: Negative.  Negative for chest pain, palpitations and leg swelling.  Gastrointestinal: Negative.  Negative for nausea, vomiting, abdominal pain, diarrhea and constipation.  Endocrine: Negative.  Negative for cold intolerance and heat intolerance.  Genitourinary: Negative.   Musculoskeletal: Negative.  Negative for back pain and arthralgias.  Skin: Negative.  Negative for rash.  Allergic/Immunologic: Negative.   Neurological: Negative.  Negative for dizziness and tremors.  Hematological: Negative.  Negative for adenopathy. Does not bruise/bleed easily.  Psychiatric/Behavioral: Negative.  The patient is not nervous/anxious.        Objective:   Physical Exam  Constitutional: He is oriented to person, place, and time. He appears well-developed and well-nourished. No distress.  HENT:  Head: Normocephalic and atraumatic.    Mouth/Throat: Oropharynx is clear and moist. No oropharyngeal exudate.  Eyes: Conjunctivae are normal. Right eye exhibits no discharge. Left eye exhibits no discharge. No scleral icterus.  Neck: Normal range of motion. Neck supple. No JVD present. No tracheal deviation present. No thyromegaly present.  Cardiovascular: Normal rate, regular rhythm, normal heart sounds and intact distal pulses.  Exam reveals no gallop and no friction rub.   No murmur heard. Pulmonary/Chest: Effort normal and breath sounds normal. No stridor. No respiratory distress. He has no wheezes. He has no rales. He exhibits no tenderness.  Abdominal: Soft. Bowel sounds are normal. He exhibits no distension and no mass. There is no tenderness. There is no rebound and no guarding.  Musculoskeletal: Normal range of motion. He exhibits no edema or tenderness.  Lymphadenopathy:    He has no cervical adenopathy.  Neurological: He is oriented to person, place, and time.  Skin: Skin is warm and dry. No rash noted. He is not diaphoretic. No erythema. No pallor.  Vitals reviewed.    Lab Results  Component Value Date   WBC 9.9 02/13/2015   HGB 14.3 02/13/2015   HCT 43.1 02/13/2015   PLT 161.0 02/13/2015   GLUCOSE 113* 02/13/2015   CHOL 179 05/05/2014   TRIG 277.0* 05/05/2014   HDL 49.00 05/05/2014   LDLDIRECT 70.6 03/24/2012   LDLCALC 75 05/05/2014   ALT 23 09/06/2014   AST 30 09/06/2014   NA 138 02/13/2015   K 4.4 02/13/2015   CL 104 02/13/2015   CREATININE 0.80 02/13/2015   BUN 15 02/13/2015   CO2 27 02/13/2015   TSH 3.50 02/13/2015   PSA 0.36 05/05/2014   INR 0.88 03/25/2014   HGBA1C 6.2 02/13/2015   MICROALBUR 4.2* 12/30/2007       Assessment & Plan:

## 2015-02-14 NOTE — Assessment & Plan Note (Signed)
Vit D level is normal now Will cont the current supplement

## 2015-02-14 NOTE — Telephone Encounter (Signed)
Called pharmacy - rx will not be filled until the 7th per insurance.  Called and informed pt.

## 2015-02-14 NOTE — Assessment & Plan Note (Signed)
His TSh is in the normal range Will cont the current dose 

## 2015-02-14 NOTE — Assessment & Plan Note (Signed)
His BP is well controlled Lytes and renal function are stable 

## 2015-02-16 LAB — VITAMIN B1: Vitamin B1 (Thiamine): 40 nmol/L — ABNORMAL HIGH (ref 8–30)

## 2015-02-17 ENCOUNTER — Other Ambulatory Visit: Payer: Self-pay | Admitting: Internal Medicine

## 2015-02-17 ENCOUNTER — Encounter: Payer: Self-pay | Admitting: Internal Medicine

## 2015-04-25 ENCOUNTER — Other Ambulatory Visit: Payer: Self-pay

## 2015-04-25 DIAGNOSIS — E559 Vitamin D deficiency, unspecified: Secondary | ICD-10-CM

## 2015-04-25 MED ORDER — CHOLECALCIFEROL 1.25 MG (50000 UT) PO TABS: 1.0000 | ORAL_TABLET | ORAL | Status: DC

## 2015-05-08 ENCOUNTER — Telehealth: Payer: Self-pay | Admitting: Internal Medicine

## 2015-05-08 DIAGNOSIS — E519 Thiamine deficiency, unspecified: Secondary | ICD-10-CM

## 2015-05-08 MED ORDER — VITAMIN B-1 100 MG PO TABS: 100.0000 mg | ORAL_TABLET | Freq: Every day | ORAL | Status: DC

## 2015-05-08 NOTE — Telephone Encounter (Signed)
Approved.  

## 2015-05-08 NOTE — Telephone Encounter (Signed)
Receive [facility] from [facility], requesting refill for Vitamin B-1 mg tablet.  Rx last written 05/10/2014 and patient last seen 02/13/2015     .

## 2015-07-26 ENCOUNTER — Other Ambulatory Visit: Payer: Self-pay

## 2015-07-26 MED ORDER — PANTOPRAZOLE SODIUM 40 MG PO TBEC: 40.0000 mg | DELAYED_RELEASE_TABLET | Freq: Every day | ORAL | Status: DC

## 2015-08-16 ENCOUNTER — Other Ambulatory Visit (INDEPENDENT_AMBULATORY_CARE_PROVIDER_SITE_OTHER): Payer: BLUE CROSS/BLUE SHIELD

## 2015-08-16 ENCOUNTER — Ambulatory Visit (INDEPENDENT_AMBULATORY_CARE_PROVIDER_SITE_OTHER): Payer: BLUE CROSS/BLUE SHIELD | Admitting: Internal Medicine

## 2015-08-16 ENCOUNTER — Encounter: Payer: Self-pay | Admitting: Internal Medicine

## 2015-08-16 VITALS — BP 116/66 | HR 68 | Temp 97.8°F | Resp 18 | Wt 255.0 lb

## 2015-08-16 DIAGNOSIS — T148XXA Other injury of unspecified body region, initial encounter: Secondary | ICD-10-CM

## 2015-08-16 DIAGNOSIS — T148 Other injury of unspecified body region: Secondary | ICD-10-CM

## 2015-08-16 DIAGNOSIS — R42 Dizziness and giddiness: Secondary | ICD-10-CM | POA: Diagnosis not present

## 2015-08-16 DIAGNOSIS — Z8679 Personal history of other diseases of the circulatory system: Secondary | ICD-10-CM

## 2015-08-16 DIAGNOSIS — H8110 Benign paroxysmal vertigo, unspecified ear: Secondary | ICD-10-CM | POA: Diagnosis not present

## 2015-08-16 DIAGNOSIS — Z87828 Personal history of other (healed) physical injury and trauma: Secondary | ICD-10-CM | POA: Insufficient documentation

## 2015-08-16 LAB — CBC WITH DIFFERENTIAL/PLATELET
Basophils Absolute: 0 10*3/uL (ref 0.0–0.1)
Basophils Relative: 0.4 % (ref 0.0–3.0)
Eosinophils Absolute: 0.2 10*3/uL (ref 0.0–0.7)
Eosinophils Relative: 1.7 % (ref 0.0–5.0)
HCT: 42.7 % (ref 39.0–52.0)
Hemoglobin: 14.1 g/dL (ref 13.0–17.0)
Lymphocytes Relative: 33.8 % (ref 12.0–46.0)
Lymphs Abs: 3.5 10*3/uL (ref 0.7–4.0)
MCHC: 33 g/dL (ref 30.0–36.0)
MCV: 84.1 fl (ref 78.0–100.0)
Monocytes Absolute: 0.9 10*3/uL (ref 0.1–1.0)
Monocytes Relative: 8.4 % (ref 3.0–12.0)
Neutro Abs: 5.7 10*3/uL (ref 1.4–7.7)
Neutrophils Relative %: 55.7 % (ref 43.0–77.0)
Platelets: 164 10*3/uL (ref 150.0–400.0)
RBC: 5.08 Mil/uL (ref 4.22–5.81)
RDW: 14.2 % (ref 11.5–15.5)
WBC: 10.3 10*3/uL (ref 4.0–10.5)

## 2015-08-16 LAB — BASIC METABOLIC PANEL
BUN: 17 mg/dL (ref 6–23)
CO2: 29 mEq/L (ref 19–32)
Calcium: 9.5 mg/dL (ref 8.4–10.5)
Chloride: 104 mEq/L (ref 96–112)
Creatinine, Ser: 0.76 mg/dL (ref 0.40–1.50)
GFR: 111.14 mL/min (ref >60.00)
Glucose, Bld: 89 mg/dL (ref 70–99)
Potassium: 4.3 mEq/L (ref 3.5–5.1)
Sodium: 140 mEq/L (ref 135–145)

## 2015-08-16 MED ORDER — MECLIZINE HCL 25 MG PO TABS: ORAL_TABLET | ORAL | Status: DC

## 2015-08-16 NOTE — Progress Notes (Signed)
   Subjective:    Patient ID: [name] [name], male    DOB: January 21, 1956, 59 y.o.   MRN: [id number]  HPI  His symptoms began 2.5 days ago after he stood up from his bed. He had lightheadedness with sensation of near syncope. He's had intermittent episodes since, both turning over in bed as well as getting up from the bed or chair.  The symptoms will last 10-15 seconds  He has no associated upper respiratory tract infection, cardiac, or neurologic symptomatology.  He was hospitalized in Mar 18, 2014 following a fall in which he sustained a depressed skull fracture with subdural hematoma subarachnoid hematoma. Alcohol level at that time was 475. He was rehospitalized 5/16-5/18 at [facility] for severe headache. This resolved with morphine. Following discharge he was monitored by Dr. Vertell Limber, Neurosurgeon.  He states he does not drink alcohol at this time.  He has some abdominal and lower extremities bruising related to moving furniture last week. He denies any bleeding dyscrasias.  Review of Systems Fever, chills, sweats, or unexplained weight loss not present. No significant headaches. Mental status change or memory loss denied. Blurred vision , diplopia or vision loss absent. There is no numbness, tingling, or weakness in extremities.   No loss of control of bladder or bowels. Radicular type pain absent. No seizure stigmata. Frontal headache, facial pain , nasal purulence, dental pain, sore throat , otic pain or otic discharge denied. Epistaxis, hemoptysis, hematuria, melena, or rectal bleeding denied. There is no abnormal bleeding or difficulty stopping bleeding with injury.      Objective:   Physical Exam Pertinent or positive findings include: With Romberg testing he fell to the right. He is able to do heel and toe walking. When he lay back on the exam table he exhibited brief vertical nystagmus. Cranial nerve exam is otherwise normal. Knee reflexes are 0+. Tympanic membranes are  scarred. Whisper is heard at 6 feet.Marland Kitchen He has scattered faint bruising over the abdomen and thighs.  General appearance :adequately nourished; in no distress.  Eyes: No conjunctival inflammation or scleral icterus is present.EOM & FOV WNL  Oral exam:  Lips and gums are healthy appearing.There is no oropharyngeal erythema or exudate noted. Dental hygiene is good; but evidence of incomplete dental work .  Heart:  Normal rate and regular rhythm. S1 and S2 normal without gallop, murmur, click, rub or other extra sounds    Lungs:Chest clear to auscultation; no wheezes, rhonchi,rales ,or rubs present.No increased work of breathing.   Abdomen: Protuberant;bowel sounds normal, soft and non-tender without masses, organomegaly or hernias noted.  No guarding or rebound.   Vascular : all pulses equal ; no bruits present.  Skin:Warm & dry.  Intact without suspicious lesions or rashes ; no tenting or jaundice   Lymphatic: No lymphadenopathy is noted about the head, neck, axilla.   Neuro: Strength, tone  normal.     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

## 2015-08-16 NOTE — Patient Instructions (Signed)
Go to [employer] M[employer] for information on benign positional vertigo (BPV) . Physical therapy exercises can treat that. Perform isometric exercise of calves  ( while seated go up on toes to count of 5 & then onto heels for 5 count). Repeat  4- 5 times prior to standing if you've been seated or supine for any significant period of time as BP drops with such positions.

## 2015-08-16 NOTE — Progress Notes (Signed)
Pre visit review using our clinic review tool, if applicable. No additional management support is needed unless otherwise documented below in the visit note. 

## 2015-09-07 ENCOUNTER — Other Ambulatory Visit: Payer: Self-pay | Admitting: Internal Medicine

## 2015-09-17 ENCOUNTER — Other Ambulatory Visit: Payer: Self-pay

## 2015-09-17 MED ORDER — TRIAZOLAM 0.25 MG PO TABS: 0.2500 mg | ORAL_TABLET | Freq: Every evening | ORAL | Status: DC | PRN

## 2015-09-17 NOTE — Telephone Encounter (Signed)
Incoming fax for pended med. Ok to fill?

## 2015-09-17 NOTE — Telephone Encounter (Signed)
HE NEEDS AN APPT FOR A THYROID CHECK PRIOR TO ANY MORE REFILLS

## 2015-10-15 ENCOUNTER — Other Ambulatory Visit: Payer: Self-pay

## 2015-10-15 MED ORDER — TRIAZOLAM 0.25 MG PO TABS: 0.2500 mg | ORAL_TABLET | Freq: Every evening | ORAL | Status: DC | PRN

## 2015-10-19 ENCOUNTER — Other Ambulatory Visit: Payer: Self-pay

## 2015-10-19 MED ORDER — ENALAPRIL MALEATE 10 MG PO TABS: 10.0000 mg | ORAL_TABLET | Freq: Every day | ORAL | Status: DC

## 2015-10-19 MED ORDER — AMLODIPINE BESYLATE 10 MG PO TABS: 10.0000 mg | ORAL_TABLET | Freq: Every day | ORAL | Status: DC

## 2015-11-01 ENCOUNTER — Telehealth: Payer: Self-pay | Admitting: Internal Medicine

## 2015-11-01 NOTE — Telephone Encounter (Signed)
He needs a f/up appt

## 2015-11-30 ENCOUNTER — Other Ambulatory Visit: Payer: Self-pay | Admitting: Internal Medicine

## 2015-12-03 NOTE — Telephone Encounter (Signed)
lmovm to call back to schedule an appt

## 2015-12-03 NOTE — Telephone Encounter (Signed)
Per PCP requesting f/u appt. Looks like for med rfs. A 60 day supple was given on 11/01/15

## 2015-12-03 NOTE — Telephone Encounter (Signed)
faxed

## 2015-12-18 ENCOUNTER — Ambulatory Visit (INDEPENDENT_AMBULATORY_CARE_PROVIDER_SITE_OTHER): Payer: BLUE CROSS/BLUE SHIELD | Admitting: Internal Medicine

## 2015-12-18 ENCOUNTER — Other Ambulatory Visit (INDEPENDENT_AMBULATORY_CARE_PROVIDER_SITE_OTHER): Payer: BLUE CROSS/BLUE SHIELD

## 2015-12-18 ENCOUNTER — Encounter: Payer: Self-pay | Admitting: Internal Medicine

## 2015-12-18 VITALS — BP 122/88 | HR 73 | Temp 97.7°F | Resp 16 | Ht 74.0 in | Wt 263.0 lb

## 2015-12-18 DIAGNOSIS — E519 Thiamine deficiency, unspecified: Secondary | ICD-10-CM

## 2015-12-18 DIAGNOSIS — D538 Other specified nutritional anemias: Secondary | ICD-10-CM

## 2015-12-18 DIAGNOSIS — R739 Hyperglycemia, unspecified: Secondary | ICD-10-CM

## 2015-12-18 DIAGNOSIS — Z9884 Bariatric surgery status: Secondary | ICD-10-CM | POA: Diagnosis not present

## 2015-12-18 DIAGNOSIS — R7989 Other specified abnormal findings of blood chemistry: Secondary | ICD-10-CM | POA: Diagnosis not present

## 2015-12-18 DIAGNOSIS — Z0000 Encounter for general adult medical examination without abnormal findings: Secondary | ICD-10-CM | POA: Diagnosis not present

## 2015-12-18 LAB — CBC WITH DIFFERENTIAL/PLATELET
Basophils Absolute: 0 10*3/uL (ref 0.0–0.1)
Basophils Relative: 0.3 % (ref 0.0–3.0)
Eosinophils Absolute: 0.2 10*3/uL (ref 0.0–0.7)
Eosinophils Relative: 1.4 % (ref 0.0–5.0)
HCT: 42.6 % (ref 39.0–52.0)
Hemoglobin: 14 g/dL (ref 13.0–17.0)
Lymphocytes Relative: 28 % (ref 12.0–46.0)
Lymphs Abs: 3.2 10*3/uL (ref 0.7–4.0)
MCHC: 32.9 g/dL (ref 30.0–36.0)
MCV: 83.8 fl (ref 78.0–100.0)
Monocytes Absolute: 1 10*3/uL (ref 0.1–1.0)
Monocytes Relative: 8.5 % (ref 3.0–12.0)
Neutro Abs: 7.1 10*3/uL (ref 1.4–7.7)
Neutrophils Relative %: 61.8 % (ref 43.0–77.0)
Platelets: 160 10*3/uL (ref 150.0–400.0)
RBC: 5.08 Mil/uL (ref 4.22–5.81)
RDW: 14.1 % (ref 11.5–15.5)
WBC: 11.6 10*3/uL — ABNORMAL HIGH (ref 4.0–10.5)

## 2015-12-18 LAB — COMPREHENSIVE METABOLIC PANEL
ALT: 16 U/L (ref 0–53)
AST: 22 U/L (ref 0–37)
Albumin: 4.1 g/dL (ref 3.5–5.2)
Alkaline Phosphatase: 102 U/L (ref 39–117)
BUN: 17 mg/dL (ref 6–23)
CO2: 29 mEq/L (ref 19–32)
Calcium: 9.1 mg/dL (ref 8.4–10.5)
Chloride: 108 mEq/L (ref 96–112)
Creatinine, Ser: 0.79 mg/dL (ref 0.40–1.50)
GFR: 106.16 mL/min (ref >60.00)
Glucose, Bld: 84 mg/dL (ref 70–99)
Potassium: 4.1 mEq/L (ref 3.5–5.1)
Sodium: 142 mEq/L (ref 135–145)
Total Bilirubin: 0.8 mg/dL (ref 0.2–1.2)
Total Protein: 7 g/dL (ref 6.0–8.3)

## 2015-12-18 LAB — LIPID PANEL
Cholesterol: 158 mg/dL (ref 0–200)
HDL: 33 mg/dL — ABNORMAL LOW (ref >39.00)
NonHDL: 124.92
Total CHOL/HDL Ratio: 5
Triglycerides: 239 mg/dL — ABNORMAL HIGH (ref 0.0–149.0)
VLDL: 47.8 mg/dL — ABNORMAL HIGH (ref 0.0–40.0)

## 2015-12-18 LAB — FERRITIN: Ferritin: 9.2 ng/mL — ABNORMAL LOW (ref 22.0–322.0)

## 2015-12-18 LAB — PSA: PSA: 0.35 ng/mL (ref 0.10–4.00)

## 2015-12-18 LAB — FOLATE: Folate: 23.3 ng/mL (ref >5.9)

## 2015-12-18 LAB — LDL CHOLESTEROL, DIRECT: Direct LDL: 101 mg/dL

## 2015-12-18 LAB — HEMOGLOBIN A1C: Hgb A1c MFr Bld: 6 % (ref 4.6–6.5)

## 2015-12-18 LAB — VITAMIN D 25 HYDROXY (VIT D DEFICIENCY, FRACTURES): VITD: 71.78 ng/mL (ref 30.00–100.00)

## 2015-12-18 LAB — TSH: TSH: 1.44 u[IU]/mL (ref 0.35–4.50)

## 2015-12-18 NOTE — Patient Instructions (Signed)

## 2015-12-18 NOTE — Progress Notes (Signed)
Subjective:  Patient ID: [name] [name], male    DOB: [birth year]  Age: 60 y.o. MRN: [id number]  CC: Anemia; Hypothyroidism; and Annual Exam   HPI [name] [name] presents for a complete physical, follow-up on anemia status post gastric bypass, and follow-up on his thyroid disease. His only complaint today is that he has gained about 30 pounds over the last year.  Outpatient Prescriptions Prior to Visit  Medication Sig Dispense Refill  . amLODipine (NORVASC) 10 MG tablet TAKE 1 TABLET BY MOUTH EVERY DAY 30 tablet 1  . Cholecalciferol 50000 UNITS TABS Take 1 tablet by mouth once a week. 12 tablet 3  . enalapril (VASOTEC) 10 MG tablet TAKE 1 TABLET (10 MG TOTAL) BY MOUTH DAILY. 30 tablet 1  . loratadine-pseudoephedrine (CLARITIN-D 24-HOUR) 10-240 MG per 24 hr tablet Take 1 tablet by mouth daily as needed for allergies. 30 tablet 11  . meclizine (ANTIVERT) 25 MG tablet 1/2-1 q 6-8 hrs prn 21 tablet 1  . pantoprazole (PROTONIX) 40 MG tablet Take 1 tablet (40 mg total) by mouth daily. 90 tablet 4  . tadalafil (CIALIS) 20 MG tablet Take 1 tablet (20 mg total) by mouth daily as needed for erectile dysfunction. 10 tablet 5  . thiamine (VITAMIN B-1) 100 MG tablet Take 1 tablet (100 mg total) by mouth daily. 90 tablet 2  . triazolam (HALCION) 0.25 MG tablet TAKE 1 TABLET BY MOUTH AT BEDTIME AS NEEDED SLEEP 30 tablet 0  . Turmeric 450 MG CAPS Take 1 capsule by mouth 2 (two) times daily with a meal. 180 capsule 3   No facility-administered medications prior to visit.    ROS Review of Systems  Constitutional: Positive for unexpected weight change. Negative for fever, chills, diaphoresis, appetite change and fatigue.  HENT: Negative.  Negative for sore throat, trouble swallowing and voice change.   Eyes: Negative.   Respiratory: Negative.  Negative for cough, choking, chest tightness, shortness of breath and stridor.   Cardiovascular: Negative.  Negative for chest pain, palpitations and leg  swelling.  Gastrointestinal: Negative.  Negative for nausea, vomiting, abdominal pain, diarrhea, constipation and blood in stool.  Endocrine: Negative.   Genitourinary: Negative.  Negative for urgency, frequency, hematuria, flank pain, decreased urine volume, penile swelling, scrotal swelling and difficulty urinating.  Musculoskeletal: Negative.  Negative for myalgias, back pain, joint swelling, arthralgias and neck pain.  Skin: Negative.  Negative for color change and rash.  Allergic/Immunologic: Negative.   Neurological: Negative.  Negative for dizziness, syncope, speech difficulty, weakness, light-headedness, numbness and headaches.  Hematological: Negative.  Negative for adenopathy. Does not bruise/bleed easily.  Psychiatric/Behavioral: Negative.     Objective:  BP 122/88 mmHg  Pulse 73  Temp(Src) 97.7 F (36.5 C) (Oral)  Resp 16  Ht 6\' 2"  (1.88 m)  Wt 263 lb (119.296 kg)  BMI 33.75 kg/m2  SpO2 96%  BP Readings from Last 3 Encounters:  12/18/15 122/88  08/16/15 116/66  02/13/15 124/82    Wt Readings from Last 3 Encounters:  12/18/15 263 lb (119.296 kg)  08/16/15 255 lb (115.667 kg)  02/13/15 272 lb (123.378 kg)    Physical Exam  Constitutional: He is oriented to person, place, and time. He appears well-developed and well-nourished. No distress.  HENT:  Mouth/Throat: Oropharynx is clear and moist. No oropharyngeal exudate.  Eyes: Conjunctivae are normal. Right eye exhibits no discharge. Left eye exhibits no discharge. No scleral icterus.  Neck: Normal range of motion. Neck supple. No JVD present. No tracheal  deviation present. No thyromegaly present.  Cardiovascular: Normal rate, regular rhythm, normal heart sounds and intact distal pulses.  Exam reveals no gallop and no friction rub.   No murmur heard. Pulmonary/Chest: Effort normal and breath sounds normal. No stridor. No respiratory distress. He has no wheezes. He has no rales. He exhibits no tenderness.  Abdominal:  Soft. Bowel sounds are normal. He exhibits no distension and no mass. There is no tenderness. There is no rebound and no guarding. Hernia confirmed negative in the right inguinal area and confirmed negative in the left inguinal area.  Genitourinary: Testes normal and penis normal. Right testis shows no mass, no swelling and no tenderness. Right testis is descended. Left testis shows no swelling and no tenderness. Left testis is descended. Uncircumcised. No penile erythema or penile tenderness. No discharge found.  Musculoskeletal: Normal range of motion. He exhibits no edema or tenderness.  Lymphadenopathy:    He has no cervical adenopathy.       Right: No inguinal adenopathy present.       Left: No inguinal adenopathy present.  Neurological: He is oriented to person, place, and time.  Skin: Skin is warm and dry. No rash noted. He is not diaphoretic. No erythema. No pallor.  Psychiatric: He has a normal mood and affect. His behavior is normal. Judgment and thought content normal.  Vitals reviewed.   Lab Results  Component Value Date   WBC 11.6* 12/18/2015   HGB 14.0 12/18/2015   HCT 42.6 12/18/2015   PLT 160.0 12/18/2015   GLUCOSE 84 12/18/2015   CHOL 158 12/18/2015   TRIG 239.0* 12/18/2015   HDL 33.00* 12/18/2015   LDLDIRECT 101.0 12/18/2015   LDLCALC 75 05/05/2014   ALT 16 12/18/2015   AST 22 12/18/2015   NA 142 12/18/2015   K 4.1 12/18/2015   CL 108 12/18/2015   CREATININE 0.79 12/18/2015   BUN 17 12/18/2015   CO2 29 12/18/2015   TSH 1.44 12/18/2015   PSA 0.35 12/18/2015   INR 0.88 03/25/2014   HGBA1C 6.0 12/18/2015   MICROALBUR 4.2* 12/30/2007    Ct Head Wo Contrast  03/26/2014  CLINICAL DATA:  Follow-up hemorrhage. EXAM: CT HEAD WITHOUT CONTRAST TECHNIQUE: Contiguous axial images were obtained from the base of the skull through the vertex without intravenous contrast. COMPARISON:  CT HEAD W/O CM dated 03/25/2014 FINDINGS: Mild asymmetric density along the right cerebellar  tentorium consistent with subdural hematoma. Evolving left inferior frontal hemorrhagic contusion with surrounding low-density vasogenic edema and decreased hemorrhagic component. Bilateral holohemispheric low-density extra-axial fluid collections, largest on the right measuring 5 mm with slight mass effect on the subjacent sulci. No residual subarachnoid blood. No hydrocephalus. No acute large vascular territory infarct. Basal cisterns are patent. Ocular globes and orbital contents are unremarkable. Remote right medial orbital blowout fracture. Mastoid air cells are well aerated. Nondisplaced right occipital skull fracture extending to the condyle. Small right parietal scalp hematoma without subcutaneous gas or radiopaque foreign bodies. IMPRESSION: Small residual subdural hematoma along the right cerebellar tentorium, evolving left inferior frontal lobe hemorrhagic contusion with stable small bilateral holohemispheric hygromas versus degenerated subdural hematomas. Resolution of subarachnoid blood without hydrocephalus. Nondisplaced right occipital skull fracture. Electronically Signed   By: Elon Alas   On: 03/26/2014 06:59   Ct Head (brain) Wo Contrast  03/25/2014  CLINICAL DATA:  Acute onset of severe headache 1 hr ago. Skull fracture with subdural, subarachnoid and parenchymal hemorrhage in [place], [place] 10 days ago. EXAM: CT HEAD WITHOUT CONTRAST  TECHNIQUE: Contiguous axial images were obtained from the base of the skull through the vertex without intravenous contrast. COMPARISON:  12/10/2012. FINDINGS: Right tentorial subdural hematoma. This appears acute. There is also a small amount of high density subarachnoid hemorrhage in the left frontal region which also appears acute. Small area of high density hemorrhage in the left parietal-occipital subarachnoid space. Also noted is white matter low density in the left corona region which was not previously present. There are also interval low  density subdural fluid collections, measuring 12 mm in maximum thickness on the left and 10 mm in maximum thickness on the right. Normal size and position of the ventricles. Nondisplaced right occipital skull fracture with a small overlying scalp hematoma. No intraventricular hemorrhage is seen. Normally pneumatized paranasal sinuses. IMPRESSION: 1. Probable acute right tentorial subdural hematoma. 2. Probable acute left frontal and left parietal occipital subarachnoid hemorrhage. 3. Interval left frontal encephalomalacia. 4. Interval bilateral subdural hygromas. 5. Nondisplaced right occipital bone fracture. These results were called by telephone at the time of interpretation on 03/25/2014 at 3:09 AM to Dr. Nicole Kindred, who verbally acknowledged these results. Electronically Signed   By: Enrique Sack M.D.   On: 03/25/2014 03:13    Assessment & Plan:   [place] was seen today for anemia, hypothyroidism and annual exam.  Diagnoses and all orders for this visit:  Thiamine deficiency- this has been adequately treated as his thiamine level is now a little high. -     Vitamin B1; Future  LAP-BAND surgery status- his anemia has resolved and all of his vitamin levels are normal now -     Folate; Future -     Ferritin; Future -     Vitamin B1; Future -     Vitamin B6; Future -     Zinc; Future -     VITAMIN D 25 Hydroxy (Vit-D Deficiency, Fractures); Future  Hyperglycemia- he has prediabetes and agrees to improve his lifestyle modifications -     Hemoglobin A1c; Future  Other specified nutritional anemias- this has resolved, vitamin levels are all normal, since he is status post gastric bypass will continue to monitor him for anemia and vitamin deficiencies. -     Folate; Future -     Ferritin; Future -     Vitamin B1; Future -     Vitamin B6; Future -     Zinc; Future  Routine general medical examination at a health care facility- his vaccines were reviewed and updated, exam done, labs ordered and  have been reviewed, his colonoscopy is up-to-date, his PSA is within normal limits am not suspicious for prostate cancer, he was given patient education material. -     Comprehensive metabolic panel; Future -     CBC with Differential/Platelet; Future -     Lipid panel; Future -     TSH; Future -     PSA; Future   I am having Mr. Ranke maintain his loratadine-pseudoephedrine, tadalafil, Turmeric, Cholecalciferol, thiamine, pantoprazole, meclizine, enalapril, amLODipine, and triazolam.  No orders of the defined types were placed in this encounter.     Follow-up: Return in about 6 months (around 06/16/2016), or if symptoms worsen or fail to improve.  Scarlette Calico, MD

## 2015-12-18 NOTE — Progress Notes (Signed)
Pre visit review using our clinic review tool, if applicable. No additional management support is needed unless otherwise documented below in the visit note. 

## 2015-12-19 ENCOUNTER — Encounter: Payer: Self-pay | Admitting: Internal Medicine

## 2015-12-19 ENCOUNTER — Other Ambulatory Visit (INDEPENDENT_AMBULATORY_CARE_PROVIDER_SITE_OTHER): Payer: BLUE CROSS/BLUE SHIELD

## 2015-12-19 DIAGNOSIS — D538 Other specified nutritional anemias: Secondary | ICD-10-CM

## 2015-12-19 LAB — IBC PANEL
Iron: 103 ug/dL (ref 42–165)
Saturation Ratios: 22.3 % (ref 20.0–50.0)
Transferrin: 330 mg/dL (ref 212.0–360.0)

## 2015-12-21 LAB — ZINC: Zinc: 97 ug/dL (ref 60–130)

## 2015-12-21 LAB — VITAMIN B1: Vitamin B1 (Thiamine): 75 nmol/L — ABNORMAL HIGH (ref 8–30)

## 2015-12-21 LAB — VITAMIN B6: Vitamin B6: 43.8 ng/mL — ABNORMAL HIGH (ref 2.1–21.7)

## 2015-12-23 ENCOUNTER — Encounter: Payer: Self-pay | Admitting: Internal Medicine

## 2015-12-27 ENCOUNTER — Other Ambulatory Visit: Payer: Self-pay | Admitting: Internal Medicine

## 2016-01-26 ENCOUNTER — Other Ambulatory Visit: Payer: Self-pay | Admitting: Internal Medicine

## 2016-01-27 ENCOUNTER — Other Ambulatory Visit: Payer: Self-pay | Admitting: Internal Medicine

## 2016-02-29 ENCOUNTER — Other Ambulatory Visit: Payer: Self-pay | Admitting: Internal Medicine

## 2016-03-02 ENCOUNTER — Other Ambulatory Visit: Payer: Self-pay | Admitting: Internal Medicine

## 2016-04-01 ENCOUNTER — Telehealth: Payer: Self-pay

## 2016-04-01 MED ORDER — ENALAPRIL MALEATE 10 MG PO TABS: ORAL_TABLET | ORAL | Status: DC

## 2016-04-01 NOTE — Telephone Encounter (Signed)
[facility] 

## 2016-04-01 NOTE — Telephone Encounter (Signed)
[facility] and is [facility] on enalapril (VASOTEC) 10 MG tablet [id number]

## 2016-04-07 ENCOUNTER — Other Ambulatory Visit: Payer: Self-pay | Admitting: Internal Medicine

## 2016-04-08 NOTE — Telephone Encounter (Signed)
[facility] 

## 2016-05-08 ENCOUNTER — Other Ambulatory Visit: Payer: Self-pay | Admitting: Internal Medicine

## 2016-05-12 NOTE — Telephone Encounter (Signed)
[facility] 

## 2016-08-21 ENCOUNTER — Other Ambulatory Visit: Payer: Self-pay | Admitting: Internal Medicine

## 2016-08-21 NOTE — Telephone Encounter (Signed)
Rx faxed to [facility].  

## 2016-08-26 ENCOUNTER — Other Ambulatory Visit: Payer: Self-pay | Admitting: Internal Medicine

## 2016-09-22 ENCOUNTER — Other Ambulatory Visit: Payer: Self-pay | Admitting: Internal Medicine

## 2016-10-11 ENCOUNTER — Other Ambulatory Visit: Payer: Self-pay | Admitting: Internal Medicine

## 2016-11-24 ENCOUNTER — Telehealth: Payer: Self-pay | Admitting: Internal Medicine

## 2016-11-24 ENCOUNTER — Encounter: Payer: Self-pay | Admitting: Gastroenterology

## 2016-11-24 ENCOUNTER — Other Ambulatory Visit: Payer: Self-pay | Admitting: Internal Medicine

## 2016-11-24 MED ORDER — PANTOPRAZOLE SODIUM 40 MG PO TBEC: 40.0000 mg | DELAYED_RELEASE_TABLET | Freq: Every day | ORAL | 0 refills | Status: DC

## 2016-11-24 NOTE — Telephone Encounter (Signed)
30 day erx sent to [facility] ([facility] No refills if pt does not come to 2/8 appt.

## 2016-11-24 NOTE — Telephone Encounter (Signed)
Pt needs a refill on his protonix,. He has made an appt for his cpe on 2/8.  His was 2/7 last year.  Can this be refilled til he can get her on the 8th

## 2016-12-03 ENCOUNTER — Encounter: Payer: Self-pay | Admitting: Gastroenterology

## 2016-12-06 ENCOUNTER — Other Ambulatory Visit: Payer: Self-pay | Admitting: Internal Medicine

## 2016-12-18 ENCOUNTER — Ambulatory Visit (INDEPENDENT_AMBULATORY_CARE_PROVIDER_SITE_OTHER): Payer: BLUE CROSS/BLUE SHIELD | Admitting: Internal Medicine

## 2016-12-18 ENCOUNTER — Other Ambulatory Visit (INDEPENDENT_AMBULATORY_CARE_PROVIDER_SITE_OTHER): Payer: BLUE CROSS/BLUE SHIELD

## 2016-12-18 VITALS — BP 122/78 | HR 75 | Temp 97.6°F | Ht 74.0 in | Wt 261.1 lb

## 2016-12-18 DIAGNOSIS — E038 Other specified hypothyroidism: Secondary | ICD-10-CM

## 2016-12-18 DIAGNOSIS — I10 Essential (primary) hypertension: Secondary | ICD-10-CM | POA: Diagnosis not present

## 2016-12-18 DIAGNOSIS — R739 Hyperglycemia, unspecified: Secondary | ICD-10-CM

## 2016-12-18 DIAGNOSIS — E559 Vitamin D deficiency, unspecified: Secondary | ICD-10-CM | POA: Diagnosis not present

## 2016-12-18 DIAGNOSIS — F5101 Primary insomnia: Secondary | ICD-10-CM

## 2016-12-18 DIAGNOSIS — Z0000 Encounter for general adult medical examination without abnormal findings: Secondary | ICD-10-CM | POA: Diagnosis not present

## 2016-12-18 DIAGNOSIS — E519 Thiamine deficiency, unspecified: Secondary | ICD-10-CM

## 2016-12-18 LAB — CBC WITH DIFFERENTIAL/PLATELET
Basophils Absolute: 0.1 10*3/uL (ref 0.0–0.1)
Basophils Relative: 0.7 % (ref 0.0–3.0)
Eosinophils Absolute: 0.1 10*3/uL (ref 0.0–0.7)
Eosinophils Relative: 1 % (ref 0.0–5.0)
HCT: 43.1 % (ref 39.0–52.0)
Hemoglobin: 14.3 g/dL (ref 13.0–17.0)
Lymphocytes Relative: 27.6 % (ref 12.0–46.0)
Lymphs Abs: 3.5 10*3/uL (ref 0.7–4.0)
MCHC: 33.1 g/dL (ref 30.0–36.0)
MCV: 85.3 fl (ref 78.0–100.0)
Monocytes Absolute: 1 10*3/uL (ref 0.1–1.0)
Monocytes Relative: 7.6 % (ref 3.0–12.0)
Neutro Abs: 8 10*3/uL — ABNORMAL HIGH (ref 1.4–7.7)
Neutrophils Relative %: 63.1 % (ref 43.0–77.0)
Platelets: 148 10*3/uL — ABNORMAL LOW (ref 150.0–400.0)
RBC: 5.05 Mil/uL (ref 4.22–5.81)
RDW: 13.7 % (ref 11.5–15.5)
WBC: 12.7 10*3/uL — ABNORMAL HIGH (ref 4.0–10.5)

## 2016-12-18 LAB — COMPREHENSIVE METABOLIC PANEL
ALT: 13 U/L (ref 0–53)
AST: 18 U/L (ref 0–37)
Albumin: 4.2 g/dL (ref 3.5–5.2)
Alkaline Phosphatase: 72 U/L (ref 39–117)
BUN: 14 mg/dL (ref 6–23)
CO2: 30 mEq/L (ref 19–32)
Calcium: 9.3 mg/dL (ref 8.4–10.5)
Chloride: 104 mEq/L (ref 96–112)
Creatinine, Ser: 0.85 mg/dL (ref 0.40–1.50)
GFR: 97.24 mL/min (ref >60.00)
Glucose, Bld: 95 mg/dL (ref 70–99)
Potassium: 4.1 mEq/L (ref 3.5–5.1)
Sodium: 140 mEq/L (ref 135–145)
Total Bilirubin: 1.1 mg/dL (ref 0.2–1.2)
Total Protein: 7.1 g/dL (ref 6.0–8.3)

## 2016-12-18 LAB — HEMOGLOBIN A1C: Hgb A1c MFr Bld: 5.8 % (ref 4.6–6.5)

## 2016-12-18 LAB — LIPID PANEL
Cholesterol: 157 mg/dL (ref 0–200)
HDL: 37.6 mg/dL — ABNORMAL LOW (ref >39.00)
LDL Cholesterol: 85 mg/dL (ref 0–99)
NonHDL: 119.02
Total CHOL/HDL Ratio: 4
Triglycerides: 169 mg/dL — ABNORMAL HIGH (ref 0.0–149.0)
VLDL: 33.8 mg/dL (ref 0.0–40.0)

## 2016-12-18 LAB — PSA: PSA: 0.44 ng/mL (ref 0.10–4.00)

## 2016-12-18 LAB — TSH: TSH: 1.53 u[IU]/mL (ref 0.35–4.50)

## 2016-12-18 LAB — VITAMIN D 25 HYDROXY (VIT D DEFICIENCY, FRACTURES): VITD: 67.63 ng/mL (ref 30.00–100.00)

## 2016-12-18 MED ORDER — ZOSTER VAC RECOMB ADJUVANTED 50 MCG/0.5ML IM SUSR: 1.0000 | Freq: Once | INTRAMUSCULAR | 1 refills | Status: AC

## 2016-12-18 MED ORDER — TRIAZOLAM 0.25 MG PO TABS: 0.2500 mg | ORAL_TABLET | Freq: Every evening | ORAL | 3 refills | Status: DC | PRN

## 2016-12-18 NOTE — Patient Instructions (Signed)

## 2016-12-18 NOTE — Progress Notes (Signed)
Subjective:  Patient ID: [name] [name], male    DOB: September 10, 1956  Age: 61 y.o. MRN: [id number]  CC: Annual Exam; Hypertension; and Hyperlipidemia   HPI JAEMS GERSTENBERGER presents for a CPX.  He feels well and offers no new complaints. [place] a couple times a week and experiences no DOE, chest pain, fatigue, palpitations, or edema. He tells me his blood pressure has been well controlled.  Outpatient Medications Prior to Visit  Medication Sig Dispense Refill  . amLODipine (NORVASC) 10 MG tablet TAKE 1 TABLET BY MOUTH EVERY DAY 30 tablet 1  . Cholecalciferol (D3-50) 50000 units capsule Take 1 capsule (50,000 Units total) by mouth once a week. 12 capsule 3  . CIALIS 20 MG tablet TAKE 1 TABLET (20 MG TOTAL) BY MOUTH DAILY AS NEEDED FOR ERECTILE DYSFUNCTION. 10 tablet 5  . enalapril (VASOTEC) 10 MG tablet TAKE 1 TABLET BY MOUTH DAILY 90 tablet 1  . loratadine-pseudoephedrine (CLARITIN-D 24-HOUR) 10-240 MG per 24 hr tablet Take 1 tablet by mouth daily as needed for allergies. 30 tablet 11  . pantoprazole (PROTONIX) 40 MG tablet Take 1 tablet (40 mg total) by mouth daily. 30 tablet 0  . thiamine ([employer] B-1) 100 MG tablet Take 1 tablet (100 mg total) by mouth daily. 90 tablet 3  . Turmeric 450 MG CAPS Take 1 capsule by mouth 2 (two) times daily with a meal. 180 capsule 3  . triazolam (HALCION) 0.25 MG tablet TAKE 1 TABLET BY MOUTH AT BEDTIME AS NEEDED FOR SLEEP 30 tablet 0  . amLODipine (NORVASC) 10 MG tablet TAKE 1 TABLET BY MOUTH EVERY DAY 90 tablet 3  . meclizine (ANTIVERT) 25 MG tablet 1/2-1 q 6-8 hrs prn 21 tablet 1   No facility-administered medications prior to visit.     ROS Review of Systems  Constitutional: Negative for appetite change, chills, diaphoresis, fatigue and unexpected weight change.  HENT: Negative.  Negative for trouble swallowing and voice change.   Eyes: Negative for visual disturbance.  Respiratory: Negative for cough, chest tightness, shortness of  breath, wheezing and stridor.   Cardiovascular: Negative for chest pain, palpitations and leg swelling.  Gastrointestinal: Negative for abdominal pain, blood in stool, constipation, diarrhea, nausea and vomiting.  Endocrine: Negative.  Negative for cold intolerance and heat intolerance.  Genitourinary: Negative.  Negative for difficulty urinating, penile swelling, scrotal swelling and testicular pain.  Musculoskeletal: Negative.  Negative for back pain and myalgias.  Skin: Negative.   Allergic/Immunologic: Negative.   Neurological: Negative.  Negative for dizziness.  Hematological: Negative for adenopathy. Does not bruise/bleed easily.  Psychiatric/Behavioral: Negative for dysphoric mood, hallucinations, sleep disturbance and suicidal ideas. The patient is nervous/anxious.     Objective:  BP 122/78 (BP Location: Left Arm, Patient Position: Sitting, Cuff Size: Normal)   Pulse 75   Temp 97.6 F (36.4 C) (Oral)   Ht 6\' 2"  (1.88 m)   Wt 261 lb 1.9 oz (118.4 kg)   SpO2 94%   BMI 33.53 kg/m   BP Readings from Last 3 Encounters:  12/18/16 122/78  12/18/15 122/88  08/16/15 116/66    Wt Readings from Last 3 Encounters:  12/18/16 261 lb 1.9 oz (118.4 kg)  12/18/15 263 lb (119.3 kg)  08/16/15 255 lb (115.7 kg)    Physical Exam  Constitutional: He is oriented to person, place, and time. He appears well-nourished. No distress.  HENT:  Mouth/Throat: No oropharyngeal exudate.  Eyes: Conjunctivae are normal. Right eye exhibits no  discharge. Left eye exhibits no discharge. No scleral icterus.  Neck: Normal range of motion. Neck supple. No JVD present. No tracheal deviation present. No thyromegaly present.  Cardiovascular: Normal rate, regular rhythm, normal heart sounds and intact distal pulses.  Exam reveals no gallop and no friction rub.   No murmur heard. EKG ---  Sinus  Rhythm  WITHIN NORMAL LIMITS  Pulmonary/Chest: Effort normal and breath sounds normal. No stridor. No respiratory  distress. He has no wheezes. He has no rales. He exhibits no tenderness.  Abdominal: Soft. Bowel sounds are normal. He exhibits no distension and no mass. There is no tenderness. There is no rebound and no guarding. Hernia confirmed negative in the right inguinal area and confirmed negative in the left inguinal area.  Genitourinary: Rectum normal, prostate normal, testes normal and penis normal. Rectal exam shows no external hemorrhoid, no internal hemorrhoid, no fissure, no mass, no tenderness, anal tone normal and guaiac negative stool. Prostate is not enlarged and not tender. Right testis shows no mass, no swelling and no tenderness. Right testis is descended. Left testis shows no mass, no swelling and no tenderness. Left testis is descended. Circumcised. No penile erythema or penile tenderness. No discharge found.  Musculoskeletal: Normal range of motion. He exhibits no edema, tenderness or deformity.  Lymphadenopathy:    He has no cervical adenopathy.       Right: No inguinal adenopathy present.       Left: No inguinal adenopathy present.  Neurological: He is oriented to person, place, and time.  Skin: Skin is warm and dry. No rash noted. He is not diaphoretic. No erythema. No pallor.  Psychiatric: He has a normal mood and affect. His behavior is normal. Judgment and thought content normal.  Vitals reviewed.   Lab Results  Component Value Date   WBC 12.7 (H) 12/18/2016   HGB 14.3 12/18/2016   HCT 43.1 12/18/2016   PLT 148.0 (L) 12/18/2016   GLUCOSE 95 12/18/2016   CHOL 157 12/18/2016   TRIG 169.0 (H) 12/18/2016   HDL 37.60 (L) 12/18/2016   LDLDIRECT 101.0 12/18/2015   LDLCALC 85 12/18/2016   ALT 13 12/18/2016   AST 18 12/18/2016   NA 140 12/18/2016   K 4.1 12/18/2016   CL 104 12/18/2016   CREATININE 0.85 12/18/2016   BUN 14 12/18/2016   CO2 30 12/18/2016   TSH 1.53 12/18/2016   PSA 0.44 12/18/2016   INR 0.88 03/25/2014   HGBA1C 5.8 12/18/2016   MICROALBUR 4.2 (H) 12/30/2007     Ct Head Wo Contrast  Result Date: 03/26/2014 CLINICAL DATA:  Follow-up hemorrhage. EXAM: CT HEAD WITHOUT CONTRAST TECHNIQUE: Contiguous axial images were obtained from the base of the skull through the vertex without intravenous contrast. COMPARISON:  CT HEAD W/O CM dated 03/25/2014 FINDINGS: Mild asymmetric density along the right cerebellar tentorium consistent with subdural hematoma. Evolving left inferior frontal hemorrhagic contusion with surrounding low-density vasogenic edema and decreased hemorrhagic component. Bilateral holohemispheric low-density extra-axial fluid collections, largest on the right measuring 5 mm with slight mass effect on the subjacent sulci. No residual subarachnoid blood. No hydrocephalus. No acute large vascular territory infarct. Basal cisterns are patent. Ocular globes and orbital contents are unremarkable. Remote right medial orbital blowout fracture. Mastoid air cells are well aerated. Nondisplaced right occipital skull fracture extending to the condyle. Small right parietal scalp hematoma without subcutaneous gas or radiopaque foreign bodies. IMPRESSION: Small residual subdural hematoma along the right cerebellar tentorium, evolving left inferior frontal lobe hemorrhagic  contusion with stable small bilateral holohemispheric hygromas versus degenerated subdural hematomas. Resolution of subarachnoid blood without hydrocephalus. Nondisplaced right occipital skull fracture. Electronically Signed   By: Elon Alas   On: 03/26/2014 06:59   Ct Head (brain) Wo Contrast  Result Date: 03/25/2014 CLINICAL DATA:  Acute onset of severe headache 1 hr ago. Skull fracture with subdural, subarachnoid and parenchymal hemorrhage in [place], [place] 10 days ago. EXAM: CT HEAD WITHOUT CONTRAST TECHNIQUE: Contiguous axial images were obtained from the base of the skull through the vertex without intravenous contrast. COMPARISON:  12/10/2012. FINDINGS: Right tentorial subdural hematoma.  This appears acute. There is also a small amount of high density subarachnoid hemorrhage in the left frontal region which also appears acute. Small area of high density hemorrhage in the left parietal-occipital subarachnoid space. Also noted is white matter low density in the left corona region which was not previously present. There are also interval low density subdural fluid collections, measuring 12 mm in maximum thickness on the left and 10 mm in maximum thickness on the right. Normal size and position of the ventricles. Nondisplaced right occipital skull fracture with a small overlying scalp hematoma. No intraventricular hemorrhage is seen. Normally pneumatized paranasal sinuses. IMPRESSION: 1. Probable acute right tentorial subdural hematoma. 2. Probable acute left frontal and left parietal occipital subarachnoid hemorrhage. 3. Interval left frontal encephalomalacia. 4. Interval bilateral subdural hygromas. 5. Nondisplaced right occipital bone fracture. These results were called by telephone at the time of interpretation on 03/25/2014 at 3:09 AM to Dr. Nicole Kindred, who verbally acknowledged these results. Electronically Signed   By: Enrique Sack M.D.   On: 03/25/2014 03:13    Assessment & Plan:   Worden was seen today for annual exam, hypertension and hyperlipidemia.  Diagnoses and all orders for this visit:  Essential hypertension- His blood pressures adequately well-controlled, his electrolytes and renal function are normal, his EKG is negative for LVH or signs of ischemia. We'll continue the combination of an ACE inhibitor and calcium channel blocker. -     Cancel: EKG 12-Lead -     EKG 12-Lead  Other specified hypothyroidism- his TSH is in the normal range, he will remain on the current dose of levothyroxine.  Hyperglycemia- improvement noted with lifestyle modifications, he is prediabetic but no medications are needed. -     Hemoglobin A1c; Future  Thiamine deficiency- will monitor his B1  level and continue B1 replacement -     Vitamin B1; Future  Vitamin D deficiency- will monitor his vitamin D level and continue vitamin D replacement therapy. -     VITAMIN D 25 Hydroxy (Vit-D Deficiency, Fractures); Future  Routine general medical examination at a health care facility- exam completed, labs ordered and reviewed, he refused a flu vaccine, he has an upcoming colonoscopy in March, patient education material was given. -     Lipid panel; Future -     Comprehensive metabolic panel; Future -     CBC with Differential/Platelet; Future -     TSH; Future -     PSA; Future -     Zoster Vac Recomb Adjuvanted (Santaquin) 50 MCG SUSR; Inject 1 Act into the muscle once.  Primary insomnia -     triazolam (HALCION) 0.25 MG tablet; Take 1 tablet (0.25 mg total) by mouth at bedtime as needed. for sleep   I have discontinued Mr. Heying's meclizine. I have also changed his triazolam. Additionally, I am having him start on Zoster Vac Recomb Adjuvanted. Lastly, I  am having him maintain his loratadine-pseudoephedrine, Turmeric, amLODipine, thiamine, Cholecalciferol, enalapril, pantoprazole, and CIALIS.  Meds ordered this encounter  Medications  . Zoster Vac Recomb Adjuvanted (SHINGRIX) 50 MCG SUSR    Sig: Inject 1 Act into the muscle once.    Dispense:  1 each    Refill:  1  . triazolam (HALCION) 0.25 MG tablet    Sig: Take 1 tablet (0.25 mg total) by mouth at bedtime as needed. for sleep    Dispense:  30 tablet    Refill:  3     Follow-up: Return in about 6 months (around 06/17/2017).  Scarlette Calico, MD

## 2016-12-18 NOTE — Progress Notes (Signed)
Pre visit review using our clinic review tool, if applicable. No additional management support is needed unless otherwise documented below in the visit note. 

## 2016-12-21 ENCOUNTER — Encounter: Payer: Self-pay | Admitting: Internal Medicine

## 2016-12-22 LAB — VITAMIN B1: Vitamin B1 (Thiamine): 56 nmol/L — ABNORMAL HIGH (ref 8–30)

## 2016-12-23 ENCOUNTER — Encounter: Payer: Self-pay | Admitting: Internal Medicine

## 2016-12-29 ENCOUNTER — Other Ambulatory Visit: Payer: Self-pay | Admitting: Internal Medicine

## 2017-01-07 ENCOUNTER — Other Ambulatory Visit: Payer: Self-pay | Admitting: Internal Medicine

## 2017-01-15 DIAGNOSIS — H2513 Age-related nuclear cataract, bilateral: Secondary | ICD-10-CM | POA: Diagnosis not present

## 2017-01-15 DIAGNOSIS — D3131 Benign neoplasm of right choroid: Secondary | ICD-10-CM | POA: Diagnosis not present

## 2017-01-20 ENCOUNTER — Encounter (HOSPITAL_COMMUNITY): Payer: Self-pay

## 2017-01-23 ENCOUNTER — Ambulatory Visit (AMBULATORY_SURGERY_CENTER): Payer: Self-pay | Admitting: *Deleted

## 2017-01-23 ENCOUNTER — Encounter: Payer: Self-pay | Admitting: Gastroenterology

## 2017-01-23 VITALS — Ht 74.0 in | Wt 263.0 lb

## 2017-01-23 DIAGNOSIS — Z1211 Encounter for screening for malignant neoplasm of colon: Secondary | ICD-10-CM

## 2017-01-23 MED ORDER — NA SULFATE-K SULFATE-MG SULF 17.5-3.13-1.6 GM/177ML PO SOLN: 1.0000 | Freq: Once | ORAL | 0 refills | Status: AC

## 2017-01-23 NOTE — Progress Notes (Signed)
No egg or soy allergy known to patient  No issues with past sedation with any surgeries  or procedures, no intubation problems  No diet pills per patient No home 02 use per patient  No blood thinners per patient  Pt denies issues with constipation  No A fib or A flutter  emmi video declined

## 2017-01-25 ENCOUNTER — Other Ambulatory Visit: Payer: Self-pay | Admitting: Internal Medicine

## 2017-02-03 ENCOUNTER — Encounter: Payer: Self-pay | Admitting: Gastroenterology

## 2017-02-03 ENCOUNTER — Ambulatory Visit (AMBULATORY_SURGERY_CENTER): Payer: BLUE CROSS/BLUE SHIELD | Admitting: Gastroenterology

## 2017-02-03 VITALS — BP 115/85 | HR 62 | Temp 98.8°F | Resp 13 | Ht 74.0 in | Wt 263.0 lb

## 2017-02-03 DIAGNOSIS — D122 Benign neoplasm of ascending colon: Secondary | ICD-10-CM

## 2017-02-03 DIAGNOSIS — K5730 Diverticulosis of large intestine without perforation or abscess without bleeding: Secondary | ICD-10-CM | POA: Diagnosis not present

## 2017-02-03 DIAGNOSIS — Z1211 Encounter for screening for malignant neoplasm of colon: Secondary | ICD-10-CM

## 2017-02-03 DIAGNOSIS — Z1212 Encounter for screening for malignant neoplasm of rectum: Secondary | ICD-10-CM | POA: Diagnosis not present

## 2017-02-03 MED ORDER — SODIUM CHLORIDE 0.9 % IV SOLN: 500.0000 mL | INTRAVENOUS | Status: DC

## 2017-02-03 NOTE — Progress Notes (Signed)
Called to room to assist during endoscopic procedure.  Patient ID and intended procedure confirmed with present staff. Received instructions for my participation in the procedure from the performing physician.  

## 2017-02-03 NOTE — Patient Instructions (Signed)
YOU HAD AN ENDOSCOPIC PROCEDURE TODAY AT [facility]:   Refer to the procedure report that was given to you for any specific questions about what was found during the examination.  If the procedure report does not answer your questions, please call your gastroenterologist to clarify.  If you requested that your care partner not be given the details of your procedure findings, then the procedure report has been included in a sealed envelope for you to review at your convenience later.  YOU SHOULD EXPECT: Some feelings of bloating in the abdomen. Passage of more gas than usual.  Walking can help get rid of the air that was put into your GI tract during the procedure and reduce the bloating. If you had a lower endoscopy (such as a colonoscopy or flexible sigmoidoscopy) you may notice spotting of blood in your stool or on the toilet paper. If you underwent a bowel prep for your procedure, you may not have a normal bowel movement for a few days.  Please Note:  You might notice some irritation and congestion in your nose or some drainage.  This is from the oxygen used during your procedure.  There is no need for concern and it should clear up in a day or so.  SYMPTOMS TO REPORT IMMEDIATELY:   Following lower endoscopy (colonoscopy or flexible sigmoidoscopy):  Excessive amounts of blood in the stool  Significant tenderness or worsening of abdominal pains  Swelling of the abdomen that is new, acute  Fever of 100F or higher  For urgent or emergent issues, a gastroenterologist can be reached at any hour by calling [phone].   DIET:  We do recommend a small meal at first, but then you may proceed to your regular diet.  Drink plenty of fluids but you should avoid alcoholic beverages for 24 hours.  ACTIVITY:  You should plan to take it easy for the rest of today and you should NOT DRIVE or use heavy machinery until tomorrow (because of the sedation medicines used during the test).     FOLLOW UP: Our staff will call the number listed on your records the next business day following your procedure to check on you and address any questions or concerns that you may have regarding the information given to you following your procedure. If we do not reach you, we will leave a message.  However, if you are feeling well and you are not experiencing any problems, there is no need to return our call.  We will assume that you have returned to your regular daily activities without incident.  If any biopsies were taken you will be contacted by phone or by letter within the next 1-3 weeks.  Please call us at [phone] if you have not heard about the biopsies in 3 weeks.   Await for biopsy results to determined next repeat Colonoscopy Polyps (handout given) Diverticulosis (handout given)   SIGNATURES/CONFIDENTIALITY: You and/or your care partner have signed paperwork which will be entered into your electronic medical record.  These signatures attest to the fact that that the information above on your After Visit Summary has been reviewed and is understood.  Full responsibility of the confidentiality of this discharge information lies with you and/or your care-partner.

## 2017-02-03 NOTE — Op Note (Signed)
[facility] Patient Name: Stephano Arrants Procedure Date: 02/03/2017 8:35 AM MRN: [id number] Endoscopist: Milus Banister , MD Age: 61 Referring MD:  Date of Birth: 07-Oct-1956 Gender: Male Account #: [id number] Procedure:                Colonoscopy Indications:              Screening for colorectal malignant neoplasm;                            colonoscopy 2008 with HP polyps only Medicines:                Monitored Anesthesia Care Procedure:                Pre-Anesthesia Assessment:                           - Prior to the procedure, a History and Physical                            was performed, and patient medications and                            allergies were reviewed. The patient's tolerance of                            previous anesthesia was also reviewed. The risks                            and benefits of the procedure and the sedation                            options and risks were discussed with the patient.                            All questions were answered, and informed consent                            was obtained. Prior Anticoagulants: The patient has                            taken no previous anticoagulant or antiplatelet                            agents. ASA Grade Assessment: II - A patient with                            mild systemic disease. After reviewing the risks                            and benefits, the patient was deemed in                            satisfactory condition to undergo the procedure.  After obtaining informed consent, the colonoscope                            was passed under direct vision. Throughout the                            procedure, the patient's blood pressure, pulse, and                            oxygen saturations were monitored continuously. The                            Model CF-HQ190L [id number]) scope was introduced                            through the anus and advanced  to the the cecum,                            identified by appendiceal orifice and ileocecal                            valve. The colonoscopy was performed without                            difficulty. The patient tolerated the procedure                            well. The quality of the bowel preparation was                            excellent. The ileocecal valve, appendiceal                            orifice, and rectum were photographed. Scope In: 8:38:16 AM Scope Out: 8:49:59 AM Scope Withdrawal Time: 0 hours 10 minutes 11 seconds  Total Procedure Duration: 0 hours 11 minutes 43 seconds  Findings:                 Two sessile polyps were found in the ascending                            colon. The polyps were 2 to 3 mm in size. These                            polyps were removed with a cold snare. Resection                            and retrieval were complete.                           Multiple small-mouthed diverticula were found in                            the entire colon.  The exam was otherwise without abnormality on                            direct and retroflexion views. Complications:            No immediate complications. Estimated blood loss:                            None. Estimated Blood Loss:     Estimated blood loss: none. Impression:               - Two 2 to 3 mm polyps in the ascending colon,                            removed with a cold snare. Resected and retrieved.                           - Diverticulosis in the entire examined colon.                           - The examination was otherwise normal on direct                            and retroflexion views. Recommendation:           - Patient has a contact number available for                            emergencies. The signs and symptoms of potential                            delayed complications were discussed with the                            patient. Return to  normal activities tomorrow.                            Written discharge instructions were provided to the                            patient.                           - Resume previous diet.                           - Continue present medications.                           You will receive a letter within 2-3 weeks with the                            pathology results and my final recommendations.                           If the polyp(s) is proven to be 'pre-cancerous' on  pathology, you will need repeat colonoscopy in 5                            years. If the polyp(s) is NOT 'precancerous' on                            pathology then you should repeat colon cancer                            screening in 10 years with colonoscopy without need                            for colon cancer screening by any method prior to                            then (including stool testing). Milus Banister, MD 02/03/2017 8:55:01 AM This report has been signed electronically.

## 2017-02-03 NOTE — Progress Notes (Signed)
Report to PACU, RN, vss, BBS= Clear.  

## 2017-02-04 ENCOUNTER — Telehealth: Payer: Self-pay

## 2017-02-04 LAB — HM COLONOSCOPY

## 2017-02-04 NOTE — Telephone Encounter (Signed)
  Follow up Call-  Call back number 02/03/2017  Post procedure Call Back phone  # [phone]  Permission to leave phone message Yes  Some recent data might be hidden     Patient questions:  Do you have a fever, pain , or abdominal swelling? No. Pain Score  0 *  Have you tolerated food without any problems? Yes.    Have you been able to return to your normal activities? Yes.    Do you have any questions about your discharge instructions: Diet   No. Medications  No. Follow up visit  No.  Do you have questions or concerns about your Care? No.  Actions: * If pain score is 4 or above: No action needed, pain <4.

## 2017-02-13 ENCOUNTER — Encounter: Payer: Self-pay | Admitting: Gastroenterology

## 2017-02-15 ENCOUNTER — Other Ambulatory Visit: Payer: Self-pay | Admitting: Internal Medicine

## 2017-04-05 ENCOUNTER — Other Ambulatory Visit: Payer: Self-pay | Admitting: Internal Medicine

## 2017-04-12 ENCOUNTER — Other Ambulatory Visit: Payer: Self-pay | Admitting: Internal Medicine

## 2017-04-12 DIAGNOSIS — F5101 Primary insomnia: Secondary | ICD-10-CM

## 2017-04-13 NOTE — Telephone Encounter (Signed)
Faxed to [facility].  

## 2017-04-21 ENCOUNTER — Other Ambulatory Visit: Payer: Self-pay | Admitting: Internal Medicine

## 2017-05-18 ENCOUNTER — Other Ambulatory Visit: Payer: Self-pay | Admitting: Internal Medicine

## 2017-05-18 DIAGNOSIS — K2100 Gastro-esophageal reflux disease with esophagitis, without bleeding: Secondary | ICD-10-CM

## 2017-05-18 MED ORDER — PANTOPRAZOLE SODIUM 40 MG PO TBEC: 40.0000 mg | DELAYED_RELEASE_TABLET | Freq: Every day | ORAL | 1 refills | Status: DC

## 2017-06-10 ENCOUNTER — Other Ambulatory Visit: Payer: Self-pay | Admitting: Internal Medicine

## 2017-06-10 DIAGNOSIS — F5101 Primary insomnia: Secondary | ICD-10-CM

## 2017-07-02 ENCOUNTER — Other Ambulatory Visit: Payer: Self-pay | Admitting: Internal Medicine

## 2017-07-19 ENCOUNTER — Other Ambulatory Visit: Payer: Self-pay | Admitting: Internal Medicine

## 2017-09-19 ENCOUNTER — Other Ambulatory Visit: Payer: Self-pay | Admitting: Internal Medicine

## 2017-09-19 DIAGNOSIS — F5101 Primary insomnia: Secondary | ICD-10-CM

## 2017-09-24 ENCOUNTER — Ambulatory Visit: Payer: BLUE CROSS/BLUE SHIELD | Admitting: Internal Medicine

## 2017-09-24 ENCOUNTER — Other Ambulatory Visit (INDEPENDENT_AMBULATORY_CARE_PROVIDER_SITE_OTHER): Payer: BLUE CROSS/BLUE SHIELD

## 2017-09-24 ENCOUNTER — Encounter: Payer: Self-pay | Admitting: Internal Medicine

## 2017-09-24 VITALS — BP 102/64 | HR 84 | Temp 97.5°F | Resp 16 | Ht 74.0 in | Wt 224.5 lb

## 2017-09-24 DIAGNOSIS — F5101 Primary insomnia: Secondary | ICD-10-CM

## 2017-09-24 DIAGNOSIS — I10 Essential (primary) hypertension: Secondary | ICD-10-CM

## 2017-09-24 DIAGNOSIS — E519 Thiamine deficiency, unspecified: Secondary | ICD-10-CM

## 2017-09-24 DIAGNOSIS — E039 Hypothyroidism, unspecified: Secondary | ICD-10-CM | POA: Diagnosis not present

## 2017-09-24 LAB — BASIC METABOLIC PANEL
BUN: 21 mg/dL (ref 6–23)
CO2: 28 mEq/L (ref 19–32)
Calcium: 9.8 mg/dL (ref 8.4–10.5)
Chloride: 107 mEq/L (ref 96–112)
Creatinine, Ser: 1.05 mg/dL (ref 0.40–1.50)
GFR: 76.01 mL/min (ref >60.00)
Glucose, Bld: 104 mg/dL — ABNORMAL HIGH (ref 70–99)
Potassium: 4.4 mEq/L (ref 3.5–5.1)
Sodium: 141 mEq/L (ref 135–145)

## 2017-09-24 LAB — CBC WITH DIFFERENTIAL/PLATELET
Basophils Absolute: 0.1 10*3/uL (ref 0.0–0.1)
Basophils Relative: 0.5 % (ref 0.0–3.0)
Eosinophils Absolute: 0.1 10*3/uL (ref 0.0–0.7)
Eosinophils Relative: 1.2 % (ref 0.0–5.0)
HCT: 45.2 % (ref 39.0–52.0)
Hemoglobin: 14.6 g/dL (ref 13.0–17.0)
Lymphocytes Relative: 22 % (ref 12.0–46.0)
Lymphs Abs: 2.5 10*3/uL (ref 0.7–4.0)
MCHC: 32.4 g/dL (ref 30.0–36.0)
MCV: 90 fl (ref 78.0–100.0)
Monocytes Absolute: 0.9 10*3/uL (ref 0.1–1.0)
Monocytes Relative: 8.1 % (ref 3.0–12.0)
Neutro Abs: 7.9 10*3/uL — ABNORMAL HIGH (ref 1.4–7.7)
Neutrophils Relative %: 68.2 % (ref 43.0–77.0)
Platelets: 143 10*3/uL — ABNORMAL LOW (ref 150.0–400.0)
RBC: 5.02 Mil/uL (ref 4.22–5.81)
RDW: 13.4 % (ref 11.5–15.5)
WBC: 11.5 10*3/uL — ABNORMAL HIGH (ref 4.0–10.5)

## 2017-09-24 MED ORDER — TRIAZOLAM 0.25 MG PO TABS: 0.2500 mg | ORAL_TABLET | Freq: Every evening | ORAL | 5 refills | Status: DC | PRN

## 2017-09-24 NOTE — Patient Instructions (Signed)

## 2017-09-24 NOTE — Progress Notes (Signed)
Subjective:  Patient ID: [name] [name], male    DOB: January 27, 1956  Age: 61 y.o. MRN: [id number]  CC: Hypertension   HPI [name] [name] presents for a BP check - Since I last saw him he has been very diligent with his lifestyle modifications.  He has lost about 40 pounds.  He runs on a treadmill at the gym for 6 miles about 4 or 5 times a week.  When he is exercising he has no chest pain, fatigue, or shortness of breath.  Over the last 4 weeks he has had a few episodes of lightheadedness and dizziness not associated with exercise.  He has been prescribed Vasotec and amlodipine. He tells me he is taking 1 of them but not both of them.  He is not sure which one he is taking.  Outpatient Medications Prior to Visit  Medication Sig Dispense Refill  . aspirin 81 MG chewable tablet Chew by mouth daily.    Marland Kitchen CIALIS 20 MG tablet TAKE 1 TABLET (20 MG TOTAL) BY MOUTH DAILY AS NEEDED FOR ERECTILE DYSFUNCTION. 10 tablet 5  . [facility] B-1 100 MG tablet TAKE 1 TABLET (100 MG TOTAL) BY MOUTH DAILY. 90 tablet 3  . D3-50 50000 units capsule TAKE 1 CAPSULE (50,000 UNITS TOTAL) BY MOUTH ONCE A WEEK. 12 capsule 3  . ibuprofen (ADVIL,MOTRIN) 200 MG tablet Take 600 mg by mouth daily.    Marland Kitchen loratadine-pseudoephedrine (CLARITIN-D 24-HOUR) 10-240 MG per 24 hr tablet Take 1 tablet by mouth daily as needed for allergies. 30 tablet 11  . Multiple Vitamin (MULTIVITAMIN) tablet Take 1 tablet by mouth daily.    . Omega-3 Fatty Acids (OMEGA-3 FISH OIL PO) Take 2 tablets by mouth daily.    Marland Kitchen OVER THE COUNTER MEDICATION Take 1 capsule by mouth daily. Nugenix    . pantoprazole (PROTONIX) 40 MG tablet Take 1 tablet (40 mg total) by mouth daily. 90 tablet 1  . Turmeric 450 MG CAPS Take 1 capsule by mouth 2 (two) times daily with a meal. 180 capsule 3  . amLODipine (NORVASC) 10 MG tablet TAKE 1 TABLET BY MOUTH EVERY DAY 30 tablet 1  . enalapril (VASOTEC) 10 MG tablet TAKE 1 TABLET BY MOUTH DAILY 90 tablet 1  . triazolam (HALCION) 0.25  MG tablet TAKE 1 TABLET BY MOUTH AT BEDTIME AS NEEDED FOR SLEEP 30 tablet 2   No facility-administered medications prior to visit.     ROS Review of Systems  Constitutional: Negative.  Negative for appetite change, diaphoresis, fatigue and unexpected weight change.  HENT: Negative.   Eyes: Negative for visual disturbance.  Respiratory: Negative for cough, chest tightness, shortness of breath and wheezing.   Cardiovascular: Negative for chest pain, palpitations and leg swelling.  Gastrointestinal: Negative for abdominal pain, constipation, diarrhea, nausea and vomiting.  Endocrine: Negative.   Genitourinary: Negative.   Musculoskeletal: Negative.  Negative for arthralgias, joint swelling and myalgias.  Skin: Negative.  Negative for color change.  Allergic/Immunologic: Negative.   Neurological: Positive for dizziness and light-headedness. Negative for seizures, syncope, facial asymmetry, weakness, numbness and headaches.  Hematological: Negative for adenopathy. Does not bruise/bleed easily.  Psychiatric/Behavioral: Positive for sleep disturbance (DFA). Negative for behavioral problems, decreased concentration, dysphoric mood and suicidal ideas. The patient is not nervous/anxious.     Objective:  BP 102/64 (BP Location: Left Arm, Patient Position: Sitting, Cuff Size: Large)   Pulse 84   Temp (!) 97.5 F (36.4 C) (Oral)   Resp 16   Ht  6\' 2"  (1.88 m)   Wt 224 lb 8 oz (101.8 kg)   SpO2 95%   BMI 28.82 kg/m   BP Readings from Last 3 Encounters:  09/24/17 102/64  02/03/17 115/85  12/18/16 122/78    Wt Readings from Last 3 Encounters:  09/24/17 224 lb 8 oz (101.8 kg)  02/03/17 263 lb (119.3 kg)  01/23/17 263 lb (119.3 kg)    Physical Exam  Constitutional: He is oriented to person, place, and time. No distress.  HENT:  Mouth/Throat: Oropharynx is clear and moist. No oropharyngeal exudate.  Eyes: Conjunctivae are normal. Right eye exhibits no discharge. Left eye exhibits no  discharge. No scleral icterus.  Neck: Normal range of motion. Neck supple. No JVD present. No thyromegaly present.  Cardiovascular: Normal rate, regular rhythm and intact distal pulses. Exam reveals no gallop and no friction rub.  No murmur heard. Pulmonary/Chest: Effort normal and breath sounds normal. No respiratory distress. He has no wheezes. He has no rales. He exhibits no tenderness.  Abdominal: Soft. Bowel sounds are normal. He exhibits no distension and no mass. There is no tenderness. There is no rebound and no guarding.  Musculoskeletal: Normal range of motion. He exhibits no edema, tenderness or deformity.  Lymphadenopathy:    He has no cervical adenopathy.  Neurological: He is alert and oriented to person, place, and time.  Skin: Skin is warm and dry. No rash noted. He is not diaphoretic. No erythema. No pallor.  Psychiatric: He has a normal mood and affect. His behavior is normal. Judgment and thought content normal.  Vitals reviewed.   Lab Results  Component Value Date   WBC 11.5 (H) 09/24/2017   HGB 14.6 09/24/2017   HCT 45.2 09/24/2017   PLT 143.0 (L) 09/24/2017   GLUCOSE 104 (H) 09/24/2017   CHOL 157 12/18/2016   TRIG 169.0 (H) 12/18/2016   HDL 37.60 (L) 12/18/2016   LDLDIRECT 101.0 12/18/2015   LDLCALC 85 12/18/2016   ALT 13 12/18/2016   AST 18 12/18/2016   NA 141 09/24/2017   K 4.4 09/24/2017   CL 107 09/24/2017   CREATININE 1.05 09/24/2017   BUN 21 09/24/2017   CO2 28 09/24/2017   TSH 1.93 09/24/2017   PSA 0.44 12/18/2016   INR 0.88 03/25/2014   HGBA1C 5.8 12/18/2016   MICROALBUR 4.2 (H) 12/30/2007    Ct Head Wo Contrast  Result Date: 03/26/2014 CLINICAL DATA:  Follow-up hemorrhage. EXAM: CT HEAD WITHOUT CONTRAST TECHNIQUE: Contiguous axial images were obtained from the base of the skull through the vertex without intravenous contrast. COMPARISON:  CT HEAD W/O CM dated 03/25/2014 FINDINGS: Mild asymmetric density along the right cerebellar tentorium  consistent with subdural hematoma. Evolving left inferior frontal hemorrhagic contusion with surrounding low-density vasogenic edema and decreased hemorrhagic component. Bilateral holohemispheric low-density extra-axial fluid collections, largest on the right measuring 5 mm with slight mass effect on the subjacent sulci. No residual subarachnoid blood. No hydrocephalus. No acute large vascular territory infarct. Basal cisterns are patent. Ocular globes and orbital contents are unremarkable. Remote right medial orbital blowout fracture. Mastoid air cells are well aerated. Nondisplaced right occipital skull fracture extending to the condyle. Small right parietal scalp hematoma without subcutaneous gas or radiopaque foreign bodies. IMPRESSION: Small residual subdural hematoma along the right cerebellar tentorium, evolving left inferior frontal lobe hemorrhagic contusion with stable small bilateral holohemispheric hygromas versus degenerated subdural hematomas. Resolution of subarachnoid blood without hydrocephalus. Nondisplaced right occipital skull fracture. Electronically Signed   By: Sandie Ano  Bloomer   On: 03/26/2014 06:59   Ct Head (brain) Wo Contrast  Result Date: 03/25/2014 CLINICAL DATA:  Acute onset of severe headache 1 hr ago. Skull fracture with subdural, subarachnoid and parenchymal hemorrhage in [place], [place] 10 days ago. EXAM: CT HEAD WITHOUT CONTRAST TECHNIQUE: Contiguous axial images were obtained from the base of the skull through the vertex without intravenous contrast. COMPARISON:  12/10/2012. FINDINGS: Right tentorial subdural hematoma. This appears acute. There is also a small amount of high density subarachnoid hemorrhage in the left frontal region which also appears acute. Small area of high density hemorrhage in the left parietal-occipital subarachnoid space. Also noted is white matter low density in the left corona region which was not previously present. There are also interval low  density subdural fluid collections, measuring 12 mm in maximum thickness on the left and 10 mm in maximum thickness on the right. Normal size and position of the ventricles. Nondisplaced right occipital skull fracture with a small overlying scalp hematoma. No intraventricular hemorrhage is seen. Normally pneumatized paranasal sinuses. IMPRESSION: 1. Probable acute right tentorial subdural hematoma. 2. Probable acute left frontal and left parietal occipital subarachnoid hemorrhage. 3. Interval left frontal encephalomalacia. 4. Interval bilateral subdural hygromas. 5. Nondisplaced right occipital bone fracture. These results were called by telephone at the time of interpretation on 03/25/2014 at 3:09 AM to Dr. Nicole Kindred, who verbally acknowledged these results. Electronically Signed   By: Enrique Sack M.D.   On: 03/25/2014 03:13    Assessment & Plan:   J[place]an was seen today for hypertension.  Diagnoses and all orders for this visit:  Essential hypertension- His blood pressure is down to 102/64 and he is symptomatic.  I have asked him to stop taking his antihypertensive.  His electrolytes and renal function are normal. -     Basic metabolic panel; Future -     CBC with Differential/Platelet; Future  Hypothyroidism, unspecified type-TSH is in the normal range.  I do not think he needs to takie levothyroxine. -     Thyroid Panel With TSH; Future  Thiamine deficiency- I will recheck his thiamine level and will advise further. -     Vitamin B1; Future  Primary insomnia -     triazolam (HALCION) 0.25 MG tablet; Take 1 tablet (0.25 mg total) at bedtime as needed by mouth. for sleep   I have discontinued [name] [name]. [name] [name] amLODipine and enalapril. I have also changed his triazolam. Additionally, I am having him maintain his loratadine-pseudoephedrine, Turmeric, CIALIS, aspirin, ibuprofen, Omega-3 Fatty Acids (OMEGA-3 FISH OIL PO), multivitamin, OVER THE COUNTER MEDICATION, D3-50, CVS B-1, and  pantoprazole.  Meds ordered this encounter  Medications  . triazolam (HALCION) 0.25 MG tablet    Sig: Take 1 tablet (0.25 mg total) at bedtime as needed by mouth. for sleep    Dispense:  30 tablet    Refill:  5    Not to exceed 4 additional fills before 10/10/2017.     Follow-up: Return in about 6 months (around 03/24/2018).  Scarlette Calico, MD

## 2017-09-28 ENCOUNTER — Other Ambulatory Visit: Payer: Self-pay | Admitting: Nurse Practitioner

## 2017-09-28 ENCOUNTER — Other Ambulatory Visit: Payer: Self-pay | Admitting: Internal Medicine

## 2017-09-28 MED ORDER — TADALAFIL 20 MG PO TABS: 20.0000 mg | ORAL_TABLET | Freq: Every day | ORAL | 0 refills | Status: DC | PRN

## 2017-09-28 NOTE — Telephone Encounter (Signed)
Please advise in PCP absence.  

## 2017-09-28 NOTE — Telephone Encounter (Signed)
Pt called back, please call back.

## 2017-09-28 NOTE — Telephone Encounter (Signed)
1 refill sent

## 2017-09-28 NOTE — Telephone Encounter (Signed)
LVM for pt to call back as soon as possible.   

## 2017-09-29 LAB — THYROID PANEL WITH TSH
Free Thyroxine Index: 2 (ref 1.4–3.8)
T3 Uptake: 32 % (ref 22–35)
T4, Total: 6.4 ug/dL (ref 4.9–10.5)
TSH: 1.93 mIU/L (ref 0.40–4.50)

## 2017-09-29 LAB — VITAMIN B1: Vitamin B1 (Thiamine): 61 nmol/L — ABNORMAL HIGH (ref 8–30)

## 2017-10-04 ENCOUNTER — Other Ambulatory Visit: Payer: Self-pay | Admitting: Internal Medicine

## 2017-10-04 ENCOUNTER — Encounter: Payer: Self-pay | Admitting: Internal Medicine

## 2017-10-28 ENCOUNTER — Other Ambulatory Visit: Payer: Self-pay | Admitting: Internal Medicine

## 2017-10-28 DIAGNOSIS — K2100 Gastro-esophageal reflux disease with esophagitis, without bleeding: Secondary | ICD-10-CM

## 2017-11-06 ENCOUNTER — Other Ambulatory Visit: Payer: Self-pay | Admitting: Internal Medicine

## 2017-11-06 NOTE — Telephone Encounter (Signed)
Okay to continue strength of vitamin d?

## 2017-11-25 ENCOUNTER — Other Ambulatory Visit: Payer: Self-pay | Admitting: Nurse Practitioner

## 2017-12-04 ENCOUNTER — Telehealth: Payer: Self-pay | Admitting: Internal Medicine

## 2017-12-04 NOTE — Telephone Encounter (Signed)
Copied from [place]. Topic: Quick Communication - Rx Refill/Question >> Dec 04, 2017  8:38 AM Celedonio Savage L wrote: Medication: tadalafil (CIALIS) 20 MG tablet    Patient would like 20 pills he states that he travels and need about 20 pills  Has the patient contacted their pharmacy? Yes.  And his pharmacy told him to call his provider   (Agent: If no, request that the patient contact the pharmacy for the refill.)   Preferred Pharmacy (with phone number or street name): [id number] [place], [place] [phone] (Phone) [phone] (Fax)     Agent: Please be advised that RX refills may take up to 3 business days. We ask that you follow-up with your pharmacy.

## 2017-12-04 NOTE — Telephone Encounter (Signed)
Call to pharmacy regarding patient insurance coverage- per pharmacy- insurance has been covering 4 pills per month- they are not sure if patient gets more if they will cover more- patient may have to pay out of pocket for that. Left message for patient regarding that information and that he may want to check on that with the pharmacy. Will forward request for more pills to provider for his consideration.  Cialis 20 mg- patient requesting increased # to 20 pills.   Patient states he travels.  LOV: 09/24/2017  Pharmacy- verified

## 2017-12-08 ENCOUNTER — Other Ambulatory Visit: Payer: Self-pay | Admitting: Internal Medicine

## 2017-12-08 DIAGNOSIS — N5201 Erectile dysfunction due to arterial insufficiency: Secondary | ICD-10-CM

## 2017-12-08 MED ORDER — TADALAFIL 20 MG PO TABS: 20.0000 mg | ORAL_TABLET | Freq: Every day | ORAL | 5 refills | Status: DC | PRN

## 2017-12-11 ENCOUNTER — Telehealth: Payer: Self-pay

## 2017-12-11 NOTE — Telephone Encounter (Signed)
Key: [id number]

## 2017-12-11 NOTE — Telephone Encounter (Signed)
PA came back cancelled.

## 2017-12-28 ENCOUNTER — Ambulatory Visit: Payer: BLUE CROSS/BLUE SHIELD | Admitting: Family

## 2017-12-28 ENCOUNTER — Encounter: Payer: Self-pay | Admitting: Family

## 2017-12-28 VITALS — BP 128/88 | HR 74 | Temp 97.5°F | Ht 74.0 in | Wt 246.1 lb

## 2017-12-28 DIAGNOSIS — M62838 Other muscle spasm: Secondary | ICD-10-CM | POA: Diagnosis not present

## 2017-12-28 DIAGNOSIS — M545 Low back pain, unspecified: Secondary | ICD-10-CM

## 2017-12-28 MED ORDER — MELOXICAM 15 MG PO TABS: 15.0000 mg | ORAL_TABLET | Freq: Every day | ORAL | 0 refills | Status: DC

## 2017-12-28 MED ORDER — METHOCARBAMOL 500 MG PO TABS: 500.0000 mg | ORAL_TABLET | Freq: Three times a day (TID) | ORAL | 0 refills | Status: DC | PRN

## 2017-12-28 NOTE — Progress Notes (Signed)
[name] [name] is a 62 y.o. male with the following history as recorded in Epic[employer]:  Patient Active Problem List   Diagnosis Date Noted  . History of subdural hematoma (post traumatic) 08/16/2015  . LAP-BAND surgery status 02/13/2015  . Screen for colon cancer 02/13/2015  . Vitamin D deficiency 05/10/2014  . Thiamine deficiency 05/10/2014  . Routine general medical examination at a health care facility 05/05/2014  . Primary insomnia 07/04/2010  . Hypothyroidism 04/05/2009  . DJD (degenerative joint disease) of knee 05/30/2008  . Hyperglycemia 03/26/2007  . Hyperlipidemia with target LDL less than 100 03/26/2007  . OBESITY 03/26/2007  . Essential hypertension 03/26/2007  . GERD 03/26/2007    Current Outpatient Medications  Medication Sig Dispense Refill  . aspirin 81 MG chewable tablet Chew by mouth daily.    . CVS B-1 100 MG tablet TAKE 1 TABLET (100 MG TOTAL) BY MOUTH DAILY. 90 tablet 3  . D3-50 50000 units capsule TAKE 1 CAPSULE (50,000 UNITS TOTAL) BY MOUTH ONCE A WEEK. 12 capsule 3  . ibuprofen (ADVIL,MOTRIN) 200 MG tablet Take 600 mg by mouth daily.    Marland Kitchen loratadine-pseudoephedrine (CLARITIN-D 24-HOUR) 10-240 MG per 24 hr tablet Take 1 tablet by mouth daily as needed for allergies. 30 tablet 11  . Multiple Vitamin (MULTIVITAMIN) tablet Take 1 tablet by mouth daily.    . Omega-3 Fatty Acids (OMEGA-3 FISH OIL PO) Take 2 tablets by mouth daily.    Marland Kitchen OVER THE COUNTER MEDICATION Take 1 capsule by mouth daily. Nugenix    . pantoprazole (PROTONIX) 40 MG tablet Take 1 tablet (40 mg total) by mouth daily. 90 tablet 1  . tadalafil (CIALIS) 20 MG tablet Take 1 tablet (20 mg total) by mouth daily as needed for erectile dysfunction. 20 tablet 5  . triazolam (HALCION) 0.25 MG tablet Take 1 tablet (0.25 mg total) at bedtime as needed by mouth. for sleep 30 tablet 5  . Turmeric 450 MG CAPS Take 1 capsule by mouth 2 (two) times daily with a meal. 180 capsule 3  . meloxicam (MOBIC) 15 MG tablet  Take 1 tablet (15 mg total) by mouth daily. 30 tablet 0  . methocarbamol (ROBAXIN) 500 MG tablet Take 1 tablet (500 mg total) by mouth every 8 (eight) hours as needed for muscle spasms. 30 tablet 0   No current facility-administered medications for this visit.     Allergies: Penicillins and Codeine  Past Medical History:  Diagnosis Date  . Allergy   . Diabetes mellitus    type 2-off all meds now after wt loss  . GERD (gastroesophageal reflux disease)   . Hyperlipidemia   . Hyperlipidemia   . Hypertension   . Osteoarthritis    hands, mild, not disabling    Past Surgical History:  Procedure Laterality Date  . Arthroscopy X 1 left ([name])     X 2 right  . COLONOSCOPY    . correction of deviated septum    . GANGLION CYST EXCISION     right wrist (k[place]ma)  . GASTRIC BANDING PORT REVISION  8/11  . PILONIDAL CYST / SINUS EXCISION  62 yrs old  . TONSILLECTOMY    . ULNAR COLLATERAL LIGAMENT REPAIR Right 09/21/2013   Procedure: ULNAR COLLATERAL LIGAMENT REPAIR METACARPAL PHALANGE RIGHT THUMB POSSIBLE APL GRAFT RECONSTRUCTION;  Surgeon: Wynonia Sours, MD;  Location: [facility];  Service: Orthopedics;  Laterality: Right;    Family History  Problem Relation Age of Onset  . Cancer Father  lung cancer metatstatic cancer to brain  . Coronary artery disease Father   . Heart attack Father        X 2 (41,45)  . Heart disease Father        CAD/MI x 2  . Cancer Mother        rapid on set  . Hypertension Mother   . Parkinsonism Maternal Grandfather   . Diabetes Neg Hx   . Colon cancer Neg Hx   . Colon polyps Neg Hx   . Esophageal cancer Neg Hx   . Rectal cancer Neg Hx   . Stomach cancer Neg Hx     Social History   Tobacco Use  . Smoking status: Former Smoker    Last attempt to quit: 06/13/1995    Years since quitting: 22.5  . Smokeless tobacco: Never Used  Substance Use Topics  . Alcohol use: No    Alcohol/week: 0.0 oz    Subjective:  Started last Thursday  with low back pain/ muscle spasm; denies any injury or trauma; has taken 2 muscle relaxers with benefit; denies any numbness/ tingling into the lower extremities; no changes in bowel or bladder habits;  Is frustrated to see that has gained almost 20 pounds since last OV in November- not able to exercise as regularly recently due to family illness.   Objective:  Vitals:   12/28/17 1323  BP: 128/88  Pulse: 74  Temp: (!) 97.5 F (36.4 C)  TempSrc: Oral  SpO2: 97%  Weight: 246 lb 1.3 oz (111.6 kg)  Height: 6\' 2"  (1.88 m)    General: Well developed, well nourished, in no acute distress  Skin : Warm and dry.  Head: Normocephalic and atraumatic  Lungs: Respirations unlabored; clear to auscultation bilaterally without wheeze, rales, rhonchi  Musculoskeletal: No deformities; no active joint inflammation  Extremities: No edema, cyanosis, clubbing  Vessels: Symmetric bilaterally  Neurologic: Alert and oriented; speech intact; face symmetrical; moves all extremities well; CNII-XII intact without focal deficit  Assessment:  1. Acute right-sided low back pain without sciatica   2. Muscle spasm     Plan:  Reassurance; do not feel imaging needed today; trial of Mobic and Robaxin; apply heat/ ice to affected area; follow-up worse, no better; he understands to limit his exercise for the next week or so;  No Follow-up on file.  No orders of the defined types were placed in this encounter.   Requested Prescriptions   Signed Prescriptions Disp Refills  . meloxicam (MOBIC) 15 MG tablet 30 tablet 0    Sig: Take 1 tablet (15 mg total) by mouth daily.  . methocarbamol (ROBAXIN) 500 MG tablet 30 tablet 0    Sig: Take 1 tablet (500 mg total) by mouth every 8 (eight) hours as needed for muscle spasms.

## 2018-01-21 ENCOUNTER — Encounter (HOSPITAL_COMMUNITY): Payer: Self-pay

## 2018-02-15 ENCOUNTER — Other Ambulatory Visit: Payer: Self-pay | Admitting: Internal Medicine

## 2018-04-07 ENCOUNTER — Other Ambulatory Visit: Payer: Self-pay | Admitting: Internal Medicine

## 2018-04-07 DIAGNOSIS — F5101 Primary insomnia: Secondary | ICD-10-CM

## 2018-04-21 ENCOUNTER — Telehealth: Payer: Self-pay | Admitting: Internal Medicine

## 2018-04-21 DIAGNOSIS — F5101 Primary insomnia: Secondary | ICD-10-CM

## 2018-04-21 NOTE — Telephone Encounter (Signed)
Check [place] registry last filled 03/04/2018.Marland KitchenJohny [name]

## 2018-04-21 NOTE — Telephone Encounter (Signed)
Refill request Halcion 0.25 mg Last OV 12-28-2017  Jodi Mourning Last filled Dr Ronnald Ramp 09-24-2017   30 tabs 5 refills 1 q hs prn  Pharmacy on File

## 2018-04-21 NOTE — Telephone Encounter (Signed)
Copied from [place] [id number]. Topic: Quick Communication - Rx Refill/Question >> Apr 21, 2018  9:45 AM Yvette Rack wrote: Medication: triazolam (HALCION) 0.25 MG tablet   pt is out of medicine  Has the patient contacted their pharmacy? Yes.  Pharmacy told him to call his provider (Agent: If no, request that the patient contact the pharmacy for the refill.) (Agent: If yes, when and what did the pharmacy advise?)  Preferred Pharmacy (with phone number or street name):   [id number] [place], [place] [phone] (Phone) [phone] (Fax)      Agent: Please be advised that RX refills may take up to 3 business days. We ask that you follow-up with your pharmacy.

## 2018-04-25 ENCOUNTER — Other Ambulatory Visit: Payer: Self-pay | Admitting: Internal Medicine

## 2018-04-25 DIAGNOSIS — F5101 Primary insomnia: Secondary | ICD-10-CM

## 2018-04-26 ENCOUNTER — Other Ambulatory Visit: Payer: Self-pay | Admitting: Internal Medicine

## 2018-04-26 DIAGNOSIS — F5101 Primary insomnia: Secondary | ICD-10-CM

## 2018-04-26 MED ORDER — TRIAZOLAM 0.25 MG PO TABS: 0.2500 mg | ORAL_TABLET | Freq: Every evening | ORAL | 0 refills | Status: DC | PRN

## 2018-04-26 NOTE — Telephone Encounter (Signed)
RX sent

## 2018-04-26 NOTE — Telephone Encounter (Signed)
Patient has scheduled an appointment on 05/12/18 as requested. Would like to get enough medication to last him to that appointment. Please advise.

## 2018-05-12 ENCOUNTER — Ambulatory Visit (INDEPENDENT_AMBULATORY_CARE_PROVIDER_SITE_OTHER): Payer: PRIVATE HEALTH INSURANCE | Admitting: Internal Medicine

## 2018-05-12 ENCOUNTER — Encounter: Payer: Self-pay | Admitting: Internal Medicine

## 2018-05-12 VITALS — BP 120/80 | HR 77 | Temp 98.1°F | Resp 16 | Ht 74.0 in | Wt 251.5 lb

## 2018-05-12 DIAGNOSIS — Z0000 Encounter for general adult medical examination without abnormal findings: Secondary | ICD-10-CM

## 2018-05-12 DIAGNOSIS — E785 Hyperlipidemia, unspecified: Secondary | ICD-10-CM | POA: Diagnosis not present

## 2018-05-12 DIAGNOSIS — I10 Essential (primary) hypertension: Secondary | ICD-10-CM

## 2018-05-12 DIAGNOSIS — E039 Hypothyroidism, unspecified: Secondary | ICD-10-CM

## 2018-05-12 DIAGNOSIS — E559 Vitamin D deficiency, unspecified: Secondary | ICD-10-CM | POA: Diagnosis not present

## 2018-05-12 DIAGNOSIS — E519 Thiamine deficiency, unspecified: Secondary | ICD-10-CM

## 2018-05-12 NOTE — Patient Instructions (Signed)

## 2018-05-12 NOTE — Progress Notes (Signed)
Subjective:  Patient ID: [name] [name], male    DOB: October 12, 1956  Age: 62 y.o. MRN: [id number]  CC: Annual Exam and Gastroesophageal Reflux   HPI [name] presents for a CPX.  He has been very active lately and denies any episodes of DOE, CP, palpitations, edema, or fatigue.  He continues to struggle with chronic insomnia but gets symptom relief with Halcion.  He tells me he is compliant with his vitamin supplements.  Outpatient Medications Prior to Visit  Medication Sig Dispense Refill  . D3-50 50000 units capsule TAKE 1 CAPSULE (50,000 UNITS TOTAL) BY MOUTH ONCE A WEEK. 12 capsule 3  . Multiple Vitamin (MULTIVITAMIN) tablet Take 1 tablet by mouth daily.    . Omega-3 Fatty Acids (OMEGA-3 FISH OIL PO) Take 2 tablets by mouth daily.    . pantoprazole (PROTONIX) 40 MG tablet Take 1 tablet (40 mg total) by mouth daily. 90 tablet 1  . tadalafil (CIALIS) 20 MG tablet Take 1 tablet (20 mg total) by mouth daily as needed for erectile dysfunction. 20 tablet 5  . thiamine ([employer] B-1) 100 MG tablet Take 1 tablet (100 mg total) by mouth daily. 90 tablet 0  . triazolam (HALCION) 0.25 MG tablet Take 1 tablet (0.25 mg total) by mouth at bedtime as needed. for sleep 30 tablet 0  . Turmeric 450 MG CAPS Take 1 capsule by mouth 2 (two) times daily with a meal. 180 capsule 3  . OVER THE COUNTER MEDICATION Take 1 capsule by mouth daily. Nugenix    . aspirin 81 MG chewable tablet Chew by mouth daily.    Marland Kitchen ibuprofen (ADVIL,MOTRIN) 200 MG tablet Take 600 mg by mouth daily.    Marland Kitchen loratadine-pseudoephedrine (CLARITIN-D 24-HOUR) 10-240 MG per 24 hr tablet Take 1 tablet by mouth daily as needed for allergies. 30 tablet 11  . meloxicam (MOBIC) 15 MG tablet Take 1 tablet (15 mg total) by mouth daily. 30 tablet 0  . methocarbamol (ROBAXIN) 500 MG tablet Take 1 tablet (500 mg total) by mouth every 8 (eight) hours as needed for muscle spasms. 30 tablet 0   No facility-administered medications prior to visit.      ROS Review of Systems  Constitutional: Negative.  Negative for appetite change, diaphoresis, fatigue and unexpected weight change.  HENT: Negative.   Eyes: Negative.   Respiratory: Negative.  Negative for apnea, cough, chest tightness, shortness of breath and wheezing.   Cardiovascular: Negative for chest pain, palpitations and leg swelling.  Gastrointestinal: Negative for abdominal pain, constipation, diarrhea and nausea.  Endocrine: Negative.   Genitourinary: Negative.  Negative for difficulty urinating and dysuria.  Musculoskeletal: Negative.  Negative for arthralgias and myalgias.  Skin: Negative.  Negative for rash.  Neurological: Negative.  Negative for dizziness, weakness and numbness.  Hematological: Negative for adenopathy. Does not bruise/bleed easily.  Psychiatric/Behavioral: Positive for sleep disturbance. Negative for confusion, decreased concentration, dysphoric mood and suicidal ideas. The patient is not nervous/anxious.     Objective:  BP 120/80 (BP Location: Left Arm, Patient Position: Sitting, Cuff Size: Normal)   Pulse 77   Temp 98.1 F (36.7 C) (Oral)   Resp 16   Ht 6\' 2"  (1.88 m)   Wt 251 lb 8 oz (114.1 kg)   SpO2 96%   BMI 32.29 kg/m   BP Readings from Last 3 Encounters:  05/12/18 120/80  12/28/17 128/88  09/24/17 102/64    Wt Readings from Last 3 Encounters:  05/12/18 251 lb 8 oz (114.1  kg)  12/28/17 246 lb 1.3 oz (111.6 kg)  09/24/17 224 lb 8 oz (101.8 kg)    Physical Exam  Constitutional: He is oriented to person, place, and time. No distress.  HENT:  Nose: Nose normal.  Mouth/Throat: No oropharyngeal exudate.  Eyes: Conjunctivae are normal. No scleral icterus.  Neck: Normal range of motion. Neck supple. No JVD present. No thyromegaly present.  Cardiovascular: Normal rate, regular rhythm and normal heart sounds.  Pulmonary/Chest: Effort normal and breath sounds normal. He has no wheezes. He has no rales.  Abdominal: Soft. Normal  appearance and bowel sounds are normal. He exhibits no mass. There is no hepatosplenomegaly. There is no tenderness. No hernia. Hernia confirmed negative in the right inguinal area and confirmed negative in the left inguinal area.  Genitourinary: Rectum normal, testes normal and penis normal. Rectal exam shows no external hemorrhoid, no internal hemorrhoid, no fissure, no mass, no tenderness, anal tone normal and guaiac negative stool. Prostate is enlarged (1+ smooth symm BPH). Prostate is not tender. Cremasteric reflex is present. Right testis shows no mass, no swelling and no tenderness. Right testis is descended. Left testis shows no mass, no swelling and no tenderness. Left testis is descended. Circumcised. No penile erythema or penile tenderness. No discharge found.  Musculoskeletal: Normal range of motion. He exhibits no edema, tenderness or deformity.  Lymphadenopathy:    He has no cervical adenopathy. No inguinal adenopathy noted on the right or left side.  Neurological: He is oriented to person, place, and time.  Skin: Skin is warm and dry. No rash noted. He is not diaphoretic.  Psychiatric: He has a normal mood and affect. His behavior is normal. Thought content normal.  Vitals reviewed.   Lab Results  Component Value Date   WBC 9.3 05/14/2018   HGB 14.9 05/14/2018   HCT 44.5 05/14/2018   PLT 155.0 05/14/2018   GLUCOSE 118 (H) 05/14/2018   CHOL 183 05/14/2018   TRIG 209.0 (H) 05/14/2018   HDL 38.60 (L) 05/14/2018   LDLDIRECT 118.0 05/14/2018   LDLCALC 85 12/18/2016   ALT 19 05/14/2018   AST 23 05/14/2018   NA 142 05/14/2018   K 4.3 05/14/2018   CL 109 05/14/2018   CREATININE 0.78 05/14/2018   BUN 14 05/14/2018   CO2 25 05/14/2018   TSH 2.49 05/14/2018   PSA 0.39 05/14/2018   INR 0.88 03/25/2014   HGBA1C 5.8 12/18/2016   MICROALBUR 4.2 (H) 12/30/2007    Ct Head Wo Contrast  Result Date: 03/26/2014 CLINICAL DATA:  Follow-up hemorrhage. EXAM: CT HEAD WITHOUT CONTRAST  TECHNIQUE: Contiguous axial images were obtained from the base of the skull through the vertex without intravenous contrast. COMPARISON:  CT HEAD W/O CM dated 03/25/2014 FINDINGS: Mild asymmetric density along the right cerebellar tentorium consistent with subdural hematoma. Evolving left inferior frontal hemorrhagic contusion with surrounding low-density vasogenic edema and decreased hemorrhagic component. Bilateral holohemispheric low-density extra-axial fluid collections, largest on the right measuring 5 mm with slight mass effect on the subjacent sulci. No residual subarachnoid blood. No hydrocephalus. No acute large vascular territory infarct. Basal cisterns are patent. Ocular globes and orbital contents are unremarkable. Remote right medial orbital blowout fracture. Mastoid air cells are well aerated. Nondisplaced right occipital skull fracture extending to the condyle. Small right parietal scalp hematoma without subcutaneous gas or radiopaque foreign bodies. IMPRESSION: Small residual subdural hematoma along the right cerebellar tentorium, evolving left inferior frontal lobe hemorrhagic contusion with stable small bilateral holohemispheric hygromas versus degenerated subdural  hematomas. Resolution of subarachnoid blood without hydrocephalus. Nondisplaced right occipital skull fracture. Electronically Signed   By: Elon Alas   On: 03/26/2014 06:59   Ct Head (brain) Wo Contrast  Result Date: 03/25/2014 CLINICAL DATA:  Acute onset of severe headache 1 hr ago. Skull fracture with subdural, subarachnoid and parenchymal hemorrhage in [place], [place] 10 days ago. EXAM: CT HEAD WITHOUT CONTRAST TECHNIQUE: Contiguous axial images were obtained from the base of the skull through the vertex without intravenous contrast. COMPARISON:  12/10/2012. FINDINGS: Right tentorial subdural hematoma. This appears acute. There is also a small amount of high density subarachnoid hemorrhage in the left frontal region which  also appears acute. Small area of high density hemorrhage in the left parietal-occipital subarachnoid space. Also noted is white matter low density in the left corona region which was not previously present. There are also interval low density subdural fluid collections, measuring 12 mm in maximum thickness on the left and 10 mm in maximum thickness on the right. Normal size and position of the ventricles. Nondisplaced right occipital skull fracture with a small overlying scalp hematoma. No intraventricular hemorrhage is seen. Normally pneumatized paranasal sinuses. IMPRESSION: 1. Probable acute right tentorial subdural hematoma. 2. Probable acute left frontal and left parietal occipital subarachnoid hemorrhage. 3. Interval left frontal encephalomalacia. 4. Interval bilateral subdural hygromas. 5. Nondisplaced right occipital bone fracture. These results were called by telephone at the time of interpretation on 03/25/2014 at 3:09 AM to Dr. Nicole Kindred, who verbally acknowledged these results. Electronically Signed   By: Enrique Sack M.D.   On: 03/25/2014 03:13    Assessment & Plan:   Keene was seen today for annual exam and gastroesophageal reflux.  Diagnoses and all orders for this visit:  Essential hypertension- His blood pressure is adequately well controlled.  Electrolytes and renal function are normal. -     Comprehensive metabolic panel; Future -     Urinalysis, Routine w reflex microscopic; Future  Acquired hypothyroidism- His TSH is in the normal range.  Thyroid replacement therapy is not indicated. -     TSH; Future  Thiamine deficiency- I will monitor his vitamin B1 level. -     CBC with Differential/Platelet; Future -     Vitamin B1; Future  Vitamin D deficiency- I will monitor his vitamin D level. -     VITAMIN D 25 Hydroxy (Vit-D Deficiency, Fractures); Future  Routine general medical examination at a health care facility- Exam completed, labs reviewed, vaccines reviewed and updated,  screening for colon cancer is up-to-date, patient education material was given. -     Lipid panel; Future -     PSA; Future  Hyperlipidemia with target LDL less than 100- His ASCVD risk score is only 10% so I do not recommend statin or aspirin therapy for CV risk reduction.   I have discontinued Delmer Kowalski. Ozdemir's loratadine-pseudoephedrine, aspirin, ibuprofen, OVER THE COUNTER MEDICATION, meloxicam, and methocarbamol. I am also having him maintain his Turmeric, Omega-3 Fatty Acids (OMEGA-3 FISH OIL PO), multivitamin, pantoprazole, D3-50, tadalafil, thiamine, and triazolam.  No orders of the defined types were placed in this encounter.    Follow-up: Return in about 6 months (around 11/12/2018).  Scarlette Calico, MD

## 2018-05-14 ENCOUNTER — Other Ambulatory Visit (INDEPENDENT_AMBULATORY_CARE_PROVIDER_SITE_OTHER): Payer: PRIVATE HEALTH INSURANCE

## 2018-05-14 ENCOUNTER — Encounter: Payer: Self-pay | Admitting: Internal Medicine

## 2018-05-14 DIAGNOSIS — E039 Hypothyroidism, unspecified: Secondary | ICD-10-CM

## 2018-05-14 DIAGNOSIS — I10 Essential (primary) hypertension: Secondary | ICD-10-CM | POA: Diagnosis not present

## 2018-05-14 DIAGNOSIS — Z0000 Encounter for general adult medical examination without abnormal findings: Secondary | ICD-10-CM

## 2018-05-14 DIAGNOSIS — E559 Vitamin D deficiency, unspecified: Secondary | ICD-10-CM

## 2018-05-14 DIAGNOSIS — E519 Thiamine deficiency, unspecified: Secondary | ICD-10-CM | POA: Diagnosis not present

## 2018-05-14 LAB — URINALYSIS, ROUTINE W REFLEX MICROSCOPIC
Bilirubin Urine: NEGATIVE
Hgb urine dipstick: NEGATIVE
Ketones, ur: NEGATIVE
Leukocytes, UA: NEGATIVE
Nitrite: NEGATIVE
RBC / HPF: NONE SEEN (ref 0–?)
Specific Gravity, Urine: >=1.03 — AB (ref 1.000–1.030)
Total Protein, Urine: NEGATIVE
Urine Glucose: NEGATIVE
Urobilinogen, UA: 0.2 (ref 0.0–1.0)
WBC, UA: NONE SEEN (ref 0–?)
pH: 6 (ref 5.0–8.0)

## 2018-05-14 LAB — LIPID PANEL
Cholesterol: 183 mg/dL (ref 0–200)
HDL: 38.6 mg/dL — ABNORMAL LOW (ref >39.00)
NonHDL: 144.49
Total CHOL/HDL Ratio: 5
Triglycerides: 209 mg/dL — ABNORMAL HIGH (ref 0.0–149.0)
VLDL: 41.8 mg/dL — ABNORMAL HIGH (ref 0.0–40.0)

## 2018-05-14 LAB — COMPREHENSIVE METABOLIC PANEL
ALT: 19 U/L (ref 0–53)
AST: 23 U/L (ref 0–37)
Albumin: 4.1 g/dL (ref 3.5–5.2)
Alkaline Phosphatase: 72 U/L (ref 39–117)
BUN: 14 mg/dL (ref 6–23)
CO2: 25 mEq/L (ref 19–32)
Calcium: 9.1 mg/dL (ref 8.4–10.5)
Chloride: 109 mEq/L (ref 96–112)
Creatinine, Ser: 0.78 mg/dL (ref 0.40–1.50)
GFR: 106.89 mL/min (ref >60.00)
Glucose, Bld: 118 mg/dL — ABNORMAL HIGH (ref 70–99)
Potassium: 4.3 mEq/L (ref 3.5–5.1)
Sodium: 142 mEq/L (ref 135–145)
Total Bilirubin: 0.6 mg/dL (ref 0.2–1.2)
Total Protein: 6.5 g/dL (ref 6.0–8.3)

## 2018-05-14 LAB — TSH: TSH: 2.49 u[IU]/mL (ref 0.35–4.50)

## 2018-05-14 LAB — CBC WITH DIFFERENTIAL/PLATELET
Basophils Absolute: 0 10*3/uL (ref 0.0–0.1)
Basophils Relative: 0.4 % (ref 0.0–3.0)
Eosinophils Absolute: 0.2 10*3/uL (ref 0.0–0.7)
Eosinophils Relative: 2.5 % (ref 0.0–5.0)
HCT: 44.5 % (ref 39.0–52.0)
Hemoglobin: 14.9 g/dL (ref 13.0–17.0)
Lymphocytes Relative: 35.7 % (ref 12.0–46.0)
Lymphs Abs: 3.3 10*3/uL (ref 0.7–4.0)
MCHC: 33.6 g/dL (ref 30.0–36.0)
MCV: 88.8 fl (ref 78.0–100.0)
Monocytes Absolute: 0.6 10*3/uL (ref 0.1–1.0)
Monocytes Relative: 6.8 % (ref 3.0–12.0)
Neutro Abs: 5.1 10*3/uL (ref 1.4–7.7)
Neutrophils Relative %: 54.6 % (ref 43.0–77.0)
Platelets: 155 10*3/uL (ref 150.0–400.0)
RBC: 5.01 Mil/uL (ref 4.22–5.81)
RDW: 13.8 % (ref 11.5–15.5)
WBC: 9.3 10*3/uL (ref 4.0–10.5)

## 2018-05-14 LAB — LDL CHOLESTEROL, DIRECT: Direct LDL: 118 mg/dL

## 2018-05-14 LAB — VITAMIN D 25 HYDROXY (VIT D DEFICIENCY, FRACTURES): VITD: 86.18 ng/mL (ref 30.00–100.00)

## 2018-05-14 LAB — PSA: PSA: 0.39 ng/mL (ref 0.10–4.00)

## 2018-05-18 ENCOUNTER — Encounter: Payer: Self-pay | Admitting: Internal Medicine

## 2018-05-18 LAB — VITAMIN B1: Vitamin B1 (Thiamine): 27 nmol/L (ref 8–30)

## 2018-05-20 ENCOUNTER — Other Ambulatory Visit: Payer: Self-pay | Admitting: Internal Medicine

## 2018-05-20 DIAGNOSIS — K2100 Gastro-esophageal reflux disease with esophagitis, without bleeding: Secondary | ICD-10-CM

## 2018-05-24 ENCOUNTER — Other Ambulatory Visit: Payer: Self-pay | Admitting: Internal Medicine

## 2018-05-24 DIAGNOSIS — F5101 Primary insomnia: Secondary | ICD-10-CM

## 2018-11-01 ENCOUNTER — Other Ambulatory Visit: Payer: Self-pay | Admitting: Internal Medicine

## 2018-11-01 DIAGNOSIS — K2100 Gastro-esophageal reflux disease with esophagitis, without bleeding: Secondary | ICD-10-CM

## 2018-11-01 NOTE — Telephone Encounter (Signed)
LVM for patient to call back and make Fu appt providers first available, pleae let us know when appt is made so we can send in RX

## 2018-11-01 NOTE — Telephone Encounter (Signed)
Pt is due for follow up the beginning of January. I can send in refill after the appt is scheduled.

## 2018-11-15 ENCOUNTER — Ambulatory Visit: Payer: PRIVATE HEALTH INSURANCE | Admitting: Internal Medicine

## 2018-11-20 ENCOUNTER — Other Ambulatory Visit: Payer: Self-pay | Admitting: Internal Medicine

## 2018-11-20 DIAGNOSIS — F5101 Primary insomnia: Secondary | ICD-10-CM

## 2018-12-09 ENCOUNTER — Encounter: Payer: Self-pay | Admitting: Internal Medicine

## 2018-12-09 ENCOUNTER — Other Ambulatory Visit: Payer: Self-pay | Admitting: Internal Medicine

## 2018-12-09 ENCOUNTER — Other Ambulatory Visit (INDEPENDENT_AMBULATORY_CARE_PROVIDER_SITE_OTHER): Payer: BLUE CROSS/BLUE SHIELD

## 2018-12-09 ENCOUNTER — Ambulatory Visit: Payer: BLUE CROSS/BLUE SHIELD | Admitting: Internal Medicine

## 2018-12-09 VITALS — BP 154/96 | HR 73 | Temp 97.8°F | Resp 16 | Ht 74.0 in | Wt 255.8 lb

## 2018-12-09 DIAGNOSIS — I10 Essential (primary) hypertension: Secondary | ICD-10-CM | POA: Diagnosis not present

## 2018-12-09 DIAGNOSIS — E519 Thiamine deficiency, unspecified: Secondary | ICD-10-CM

## 2018-12-09 DIAGNOSIS — N5201 Erectile dysfunction due to arterial insufficiency: Secondary | ICD-10-CM

## 2018-12-09 DIAGNOSIS — E039 Hypothyroidism, unspecified: Secondary | ICD-10-CM | POA: Diagnosis not present

## 2018-12-09 DIAGNOSIS — F1210 Cannabis abuse, uncomplicated: Secondary | ICD-10-CM

## 2018-12-09 DIAGNOSIS — Z23 Encounter for immunization: Secondary | ICD-10-CM

## 2018-12-09 DIAGNOSIS — R05 Cough: Secondary | ICD-10-CM

## 2018-12-09 DIAGNOSIS — J440 Chronic obstructive pulmonary disease with acute lower respiratory infection: Secondary | ICD-10-CM | POA: Diagnosis not present

## 2018-12-09 DIAGNOSIS — E559 Vitamin D deficiency, unspecified: Secondary | ICD-10-CM

## 2018-12-09 DIAGNOSIS — J209 Acute bronchitis, unspecified: Secondary | ICD-10-CM | POA: Insufficient documentation

## 2018-12-09 DIAGNOSIS — K2100 Gastro-esophageal reflux disease with esophagitis, without bleeding: Secondary | ICD-10-CM

## 2018-12-09 DIAGNOSIS — R739 Hyperglycemia, unspecified: Secondary | ICD-10-CM

## 2018-12-09 DIAGNOSIS — R059 Cough, unspecified: Secondary | ICD-10-CM

## 2018-12-09 LAB — BASIC METABOLIC PANEL
BUN: 15 mg/dL (ref 6–23)
CO2: 25 mEq/L (ref 19–32)
Calcium: 9.5 mg/dL (ref 8.4–10.5)
Chloride: 108 mEq/L (ref 96–112)
Creatinine, Ser: 0.83 mg/dL (ref 0.40–1.50)
GFR: 93.44 mL/min (ref >60.00)
Glucose, Bld: 103 mg/dL — ABNORMAL HIGH (ref 70–99)
Potassium: 4.6 mEq/L (ref 3.5–5.1)
Sodium: 142 mEq/L (ref 135–145)

## 2018-12-09 LAB — URINALYSIS, ROUTINE W REFLEX MICROSCOPIC
Hgb urine dipstick: NEGATIVE
Ketones, ur: NEGATIVE
Leukocytes, UA: NEGATIVE
Nitrite: NEGATIVE
RBC / HPF: NONE SEEN (ref 0–?)
Specific Gravity, Urine: >=1.03 — AB (ref 1.000–1.030)
Total Protein, Urine: NEGATIVE
Urine Glucose: NEGATIVE
Urobilinogen, UA: 0.2 (ref 0.0–1.0)
pH: 5.5 (ref 5.0–8.0)

## 2018-12-09 LAB — POCT EXHALED NITRIC OXIDE: FeNO level (ppb): 10

## 2018-12-09 LAB — HEMOGLOBIN A1C: Hgb A1c MFr Bld: 6 % (ref 4.6–6.5)

## 2018-12-09 LAB — TSH: TSH: 2.3 u[IU]/mL (ref 0.35–4.50)

## 2018-12-09 MED ORDER — OLMESARTAN MEDOXOMIL 20 MG PO TABS: 20.0000 mg | ORAL_TABLET | Freq: Every day | ORAL | 0 refills | Status: DC

## 2018-12-09 MED ORDER — VITAMIN B-1 100 MG PO TABS: 100.0000 mg | ORAL_TABLET | Freq: Every day | ORAL | 1 refills | Status: DC

## 2018-12-09 MED ORDER — TADALAFIL 20 MG PO TABS: 20.0000 mg | ORAL_TABLET | Freq: Every day | ORAL | 5 refills | Status: DC | PRN

## 2018-12-09 MED ORDER — CHOLECALCIFEROL 1.25 MG (50000 UT) PO CAPS: 50000.0000 [IU] | ORAL_CAPSULE | ORAL | 0 refills | Status: DC

## 2018-12-09 MED ORDER — TIOTROPIUM BROMIDE-OLODATEROL 2.5-2.5 MCG/ACT IN AERS: 2.0000 | INHALATION_SPRAY | Freq: Every day | RESPIRATORY_TRACT | 5 refills | Status: DC

## 2018-12-09 MED ORDER — PANTOPRAZOLE SODIUM 40 MG PO TBEC: 40.0000 mg | DELAYED_RELEASE_TABLET | Freq: Every day | ORAL | 1 refills | Status: DC

## 2018-12-09 NOTE — Progress Notes (Signed)
Subjective:  Patient ID: [name] [name], male    DOB: [birth year]  Age: 63 y.o. MRN: [id number]  CC: Cough and Hypertension   HPI [name] [name] presents for a BP check - He complains of a 2-day history of nonproductive cough.  He quit smoking cigarettes about 15 years ago.  Prior to that it sounds like he had about a 30-pack-year history.  He does not monitor his blood pressure.  He has not recently been working on his lifestyle modifications but when he is active he denies CP or DOE.  Outpatient Medications Prior to Visit  Medication Sig Dispense Refill  . Multiple Vitamin (MULTIVITAMIN) tablet Take 1 tablet by mouth daily.    . Omega-3 Fatty Acids (OMEGA-3 FISH OIL PO) Take 2 tablets by mouth daily.    . triazolam (HALCION) 0.25 MG tablet TAKE 1 TABLET (0.25 MG TOTAL) BY MOUTH AT BEDTIME AS NEEDED. FOR SLEEP 90 tablet 0  . Turmeric 450 MG CAPS Take 1 capsule by mouth 2 (two) times daily with a meal. 180 capsule 3  . D3-50 50000 units capsule TAKE 1 CAPSULE (50,000 UNITS TOTAL) BY MOUTH ONCE A WEEK. 12 capsule 3  . pantoprazole (PROTONIX) 40 MG tablet Take 1 tablet (40 mg total) by mouth daily. 90 tablet 1  . tadalafil (CIALIS) 20 MG tablet Take 1 tablet (20 mg total) by mouth daily as needed for erectile dysfunction. 20 tablet 5  . thiamine (VITAMIN B-1) 100 MG tablet TAKE 1 TABLET BY MOUTH EVERY DAY 100 tablet 1   No facility-administered medications prior to visit.     ROS Review of Systems  Constitutional: Negative.  Negative for chills, diaphoresis, fatigue and fever.  HENT: Negative.  Negative for sore throat and trouble swallowing.   Respiratory: Positive for cough. Negative for chest tightness, shortness of breath and wheezing.   Cardiovascular: Negative for chest pain, palpitations and leg swelling.  Gastrointestinal: Negative for abdominal pain, constipation, diarrhea, nausea and vomiting.  Endocrine: Negative.   Genitourinary: Negative.  Negative for difficulty  urinating.  Musculoskeletal: Negative.  Negative for arthralgias and myalgias.  Skin: Negative.  Negative for color change.  Neurological: Negative.  Negative for dizziness, weakness and light-headedness.  Hematological: Negative for adenopathy. Does not bruise/bleed easily.  Psychiatric/Behavioral: Negative.     Objective:  BP (!) 154/96 (BP Location: Left Arm, Patient Position: Sitting, Cuff Size: Normal)   Pulse 73   Temp 97.8 F (36.6 C) (Oral)   Resp 16   Ht 6\' 2"  (1.88 m)   Wt 255 lb 12 oz (116 kg)   SpO2 95%   BMI 32.84 kg/m   BP Readings from Last 3 Encounters:  12/09/18 (!) 154/96  05/12/18 120/80  12/28/17 128/88    Wt Readings from Last 3 Encounters:  12/09/18 255 lb 12 oz (116 kg)  05/12/18 251 lb 8 oz (114.1 kg)  12/28/17 246 lb 1.3 oz (111.6 kg)    Physical Exam Vitals signs reviewed.  Constitutional:      General: He is not in acute distress.    Appearance: He is not ill-appearing, toxic-appearing or diaphoretic.  HENT:     Nose: Nose normal. No congestion or rhinorrhea.     Mouth/Throat:     Mouth: Mucous membranes are moist.     Pharynx: Oropharynx is clear. No oropharyngeal exudate.  Eyes:     General: No scleral icterus.    Conjunctiva/sclera: Conjunctivae normal.  Neck:     Musculoskeletal: Normal range of  motion and neck supple. No neck rigidity or muscular tenderness.  Cardiovascular:     Rate and Rhythm: Normal rate and regular rhythm.     Heart sounds: No murmur. No friction rub. No gallop.   Pulmonary:     Effort: Pulmonary effort is normal. No tachypnea or respiratory distress.     Breath sounds: No stridor. Examination of the right-lower field reveals wheezing and rhonchi. Examination of the left-lower field reveals wheezing and rhonchi. Wheezing and rhonchi present. No decreased breath sounds or rales.  Abdominal:     General: Abdomen is flat.     Palpations: There is no hepatomegaly, splenomegaly or mass.     Tenderness: There is no  abdominal tenderness. There is no guarding.  Musculoskeletal: Normal range of motion.        General: No swelling.     Right lower leg: No edema.     Left lower leg: No edema.  Skin:    General: Skin is warm and dry.     Coloration: Skin is not pale.     Findings: No erythema or rash.  Neurological:     General: No focal deficit present.     Mental Status: He is oriented to person, place, and time. Mental status is at baseline.     Lab Results  Component Value Date   WBC 9.3 05/14/2018   HGB 14.9 05/14/2018   HCT 44.5 05/14/2018   PLT 155.0 05/14/2018   GLUCOSE 103 (H) 12/09/2018   CHOL 183 05/14/2018   TRIG 209.0 (H) 05/14/2018   HDL 38.60 (L) 05/14/2018   LDLDIRECT 118.0 05/14/2018   LDLCALC 85 12/18/2016   ALT 19 05/14/2018   AST 23 05/14/2018   NA 142 12/09/2018   K 4.6 12/09/2018   CL 108 12/09/2018   CREATININE 0.83 12/09/2018   BUN 15 12/09/2018   CO2 25 12/09/2018   TSH 2.30 12/09/2018   PSA 0.39 05/14/2018   INR 0.88 03/25/2014   HGBA1C 6.0 12/09/2018   MICROALBUR 4.2 (H) 12/30/2007    Ct Head Wo Contrast  Result Date: 03/26/2014 CLINICAL DATA:  Follow-up hemorrhage. EXAM: CT HEAD WITHOUT CONTRAST TECHNIQUE: Contiguous axial images were obtained from the base of the skull through the vertex without intravenous contrast. COMPARISON:  CT HEAD W/O CM dated 03/25/2014 FINDINGS: Mild asymmetric density along the right cerebellar tentorium consistent with subdural hematoma. Evolving left inferior frontal hemorrhagic contusion with surrounding low-density vasogenic edema and decreased hemorrhagic component. Bilateral holohemispheric low-density extra-axial fluid collections, largest on the right measuring 5 mm with slight mass effect on the subjacent sulci. No residual subarachnoid blood. No hydrocephalus. No acute large vascular territory infarct. Basal cisterns are patent. Ocular globes and orbital contents are unremarkable. Remote right medial orbital blowout fracture.  Mastoid air cells are well aerated. Nondisplaced right occipital skull fracture extending to the condyle. Small right parietal scalp hematoma without subcutaneous gas or radiopaque foreign bodies. IMPRESSION: Small residual subdural hematoma along the right cerebellar tentorium, evolving left inferior frontal lobe hemorrhagic contusion with stable small bilateral holohemispheric hygromas versus degenerated subdural hematomas. Resolution of subarachnoid blood without hydrocephalus. Nondisplaced right occipital skull fracture. Electronically Signed   By: Elon Alas   On: 03/26/2014 06:59   Ct Head (brain) Wo Contrast  Result Date: 03/25/2014 CLINICAL DATA:  Acute onset of severe headache 1 hr ago. Skull fracture with subdural, subarachnoid and parenchymal hemorrhage in [place], [place] 10 days ago. EXAM: CT HEAD WITHOUT CONTRAST TECHNIQUE: Contiguous axial images were obtained  from the base of the skull through the vertex without intravenous contrast. COMPARISON:  12/10/2012. FINDINGS: Right tentorial subdural hematoma. This appears acute. There is also a small amount of high density subarachnoid hemorrhage in the left frontal region which also appears acute. Small area of high density hemorrhage in the left parietal-occipital subarachnoid space. Also noted is white matter low density in the left corona region which was not previously present. There are also interval low density subdural fluid collections, measuring 12 mm in maximum thickness on the left and 10 mm in maximum thickness on the right. Normal size and position of the ventricles. Nondisplaced right occipital skull fracture with a small overlying scalp hematoma. No intraventricular hemorrhage is seen. Normally pneumatized paranasal sinuses. IMPRESSION: 1. Probable acute right tentorial subdural hematoma. 2. Probable acute left frontal and left parietal occipital subarachnoid hemorrhage. 3. Interval left frontal encephalomalacia. 4. Interval  bilateral subdural hygromas. 5. Nondisplaced right occipital bone fracture. These results were called by telephone at the time of interpretation on 03/25/2014 at 3:09 AM to Dr. Nicole Kindred, who verbally acknowledged these results. Electronically Signed   By: Enrique Sack M.D.   On: 03/25/2014 03:13    Assessment & Plan:   Laney was seen today for cough and hypertension.  Diagnoses and all orders for this visit:  Essential hypertension- His blood pressure is not adequately well controlled.  His labs are negative for secondary causes or endorgan damage.  I will screen his urine for substances of abuse (stimulants and alcohol.)  Will also start treating this with an ARB. -     Basic metabolic panel; Future -     TSH; Future -     Urinalysis, Routine w reflex microscopic; Future -     Urine drugs of abuse scrn w alc, routine (Ref Lab); Future -     olmesartan (BENICAR) 20 MG tablet; Take 1 tablet (20 mg total) by mouth daily.  Hyperglycemia -     Basic metabolic panel; Future -     Hemoglobin A1c; Future  Need for shingles vaccine -     Varicella-zoster vaccine IM (Shingrix)  Acquired hypothyroidism- His TSH is in the normal range.  He will remain on the current dose of levothyroxine. -     TSH; Future  Thiamine deficiency -     thiamine (VITAMIN B-1) 100 MG tablet; Take 1 tablet (100 mg total) by mouth daily.  Vitamin D deficiency -     Cholecalciferol (D3-50) 1.25 MG (50000 UT) capsule; Take 1 capsule (50,000 Units total) by mouth once a week.  Cough- His FeNO is not elevated which indicates he is not suffering from an eosinophilic or allergic condition and steroid therapy is not indicated. -     POCT EXHALED NITRIC OXIDE  COPD (chronic obstructive pulmonary disease) with acute bronchitis ([place])- I have asked him to start using a LABA/LAMA inhaler for this.  I gave him a sample of Stiolto and showed him how to use it.  He demonstrated proficiency with its use. -     Tiotropium  Bromide-Olodaterol (STIOLTO RESPIMAT) 2.5-2.5 MCG/ACT AERS; Inhale 2 puffs into the lungs daily.   I have changed Norina Buzzard. Strohmeier's D3-50 to Cholecalciferol. I have also changed his thiamine. I am also having him start on Tiotropium Bromide-Olodaterol and olmesartan. Additionally, I am having him maintain his Turmeric, Omega-3 Fatty Acids (OMEGA-3 FISH OIL PO), multivitamin, triazolam, pantoprazole, and tadalafil.  Meds ordered this encounter  Medications  . Cholecalciferol (D3-50) 1.25 MG (50000  UT) capsule    Sig: Take 1 capsule (50,000 Units total) by mouth once a week.    Dispense:  12 capsule    Refill:  0  . thiamine (VITAMIN B-1) 100 MG tablet    Sig: Take 1 tablet (100 mg total) by mouth daily.    Dispense:  100 tablet    Refill:  1  . Tiotropium Bromide-Olodaterol (STIOLTO RESPIMAT) 2.5-2.5 MCG/ACT AERS    Sig: Inhale 2 puffs into the lungs daily.    Dispense:  1 Inhaler    Refill:  5  . olmesartan (BENICAR) 20 MG tablet    Sig: Take 1 tablet (20 mg total) by mouth daily.    Dispense:  90 tablet    Refill:  0  . pantoprazole (PROTONIX) 40 MG tablet    Sig: Take 1 tablet (40 mg total) by mouth daily.    Dispense:  90 tablet    Refill:  1  . tadalafil (CIALIS) 20 MG tablet    Sig: Take 1 tablet (20 mg total) by mouth daily as needed for erectile dysfunction.    Dispense:  20 tablet    Refill:  5     Follow-up: Return in about 3 months (around 03/10/2019).  Scarlette Calico, MD

## 2018-12-09 NOTE — Patient Instructions (Signed)

## 2018-12-13 DIAGNOSIS — F1210 Cannabis abuse, uncomplicated: Secondary | ICD-10-CM | POA: Insufficient documentation

## 2018-12-13 LAB — PANEL 799049
CARBOXY THC GC/MS CONF: 343 ng/mL
Cannabinoid GC/MS, Ur: POSITIVE — AB

## 2018-12-13 LAB — URINE DRUGS OF ABUSE SCREEN W ALC, ROUTINE (REF LAB)
Amphetamines, Urine: NEGATIVE ng/mL
Barbiturate Quant, Ur: NEGATIVE ng/mL
Cocaine (Metab.): NEGATIVE ng/mL
Ethanol, Urine: NEGATIVE %
Methadone Screen, Urine: NEGATIVE ng/mL
Opiate Quant, Ur: NEGATIVE ng/mL
PCP Quant, Ur: NEGATIVE ng/mL
Propoxyphene: NEGATIVE ng/mL

## 2018-12-13 LAB — DRUG PROFILE 799031: BENZODIAZEPINES: NEGATIVE

## 2019-01-04 ENCOUNTER — Other Ambulatory Visit: Payer: Self-pay | Admitting: Internal Medicine

## 2019-01-04 DIAGNOSIS — E559 Vitamin D deficiency, unspecified: Secondary | ICD-10-CM

## 2019-02-17 ENCOUNTER — Other Ambulatory Visit: Payer: Self-pay | Admitting: Internal Medicine

## 2019-02-17 DIAGNOSIS — F5101 Primary insomnia: Secondary | ICD-10-CM

## 2019-02-27 ENCOUNTER — Other Ambulatory Visit: Payer: Self-pay | Admitting: Internal Medicine

## 2019-02-27 DIAGNOSIS — I10 Essential (primary) hypertension: Secondary | ICD-10-CM

## 2019-03-09 ENCOUNTER — Encounter: Payer: Self-pay | Admitting: Internal Medicine

## 2019-03-09 ENCOUNTER — Other Ambulatory Visit: Payer: Self-pay

## 2019-03-09 ENCOUNTER — Ambulatory Visit: Payer: BLUE CROSS/BLUE SHIELD | Admitting: Internal Medicine

## 2019-03-09 ENCOUNTER — Other Ambulatory Visit (INDEPENDENT_AMBULATORY_CARE_PROVIDER_SITE_OTHER): Payer: BLUE CROSS/BLUE SHIELD

## 2019-03-09 VITALS — BP 132/82 | HR 74 | Temp 97.9°F | Resp 16 | Ht 74.0 in | Wt 283.0 lb

## 2019-03-09 DIAGNOSIS — I10 Essential (primary) hypertension: Secondary | ICD-10-CM

## 2019-03-09 DIAGNOSIS — M170 Bilateral primary osteoarthritis of knee: Secondary | ICD-10-CM | POA: Diagnosis not present

## 2019-03-09 DIAGNOSIS — M19041 Primary osteoarthritis, right hand: Secondary | ICD-10-CM | POA: Diagnosis not present

## 2019-03-09 DIAGNOSIS — M19042 Primary osteoarthritis, left hand: Secondary | ICD-10-CM

## 2019-03-09 LAB — BASIC METABOLIC PANEL
BUN: 27 mg/dL — ABNORMAL HIGH (ref 6–23)
CO2: 25 mEq/L (ref 19–32)
Calcium: 8.8 mg/dL (ref 8.4–10.5)
Chloride: 106 mEq/L (ref 96–112)
Creatinine, Ser: 1 mg/dL (ref 0.40–1.50)
GFR: 75.3 mL/min (ref >60.00)
Glucose, Bld: 100 mg/dL — ABNORMAL HIGH (ref 70–99)
Potassium: 4.3 mEq/L (ref 3.5–5.1)
Sodium: 140 mEq/L (ref 135–145)

## 2019-03-09 MED ORDER — MELOXICAM 15 MG PO TABS: 15.0000 mg | ORAL_TABLET | Freq: Every day | ORAL | 1 refills | Status: DC

## 2019-03-09 NOTE — Progress Notes (Signed)
Subjective:  Patient ID: [name] [name], male    DOB: 06/18/1956  Age: 63 y.o. MRN: [id number]  CC: Osteoarthritis and Hypertension   HPI [name] [name] presents for f/up - He tells me his blood pressure has been well controlled.  He is tolerating the ARB well.  He denies dizziness, lightheadedness, chest pain, shortness of breath, or edema.  He complains of chronic pain in his joints.  He notices the pain in his knees and his finger joints - he points to the MCP and the PIP joints.  He has never seen joint redness or swelling.  He has tried to tumeric and tylenol without much symptom relief.  Outpatient Medications Prior to Visit  Medication Sig Dispense Refill   Cholecalciferol (VITAMIN D3) 1.25 MG (50000 UT) CAPS TAKE 1 CAPSULE (50,000 UNITS TOTAL) BY MOUTH ONCE A WEEK. 12 capsule 1   Multiple Vitamin (MULTIVITAMIN) tablet Take 1 tablet by mouth daily.     olmesartan (BENICAR) 20 MG tablet TAKE 1 TABLET BY MOUTH EVERY DAY 90 tablet 1   Omega-3 Fatty Acids (OMEGA-3 FISH OIL PO) Take 2 tablets by mouth daily.     pantoprazole (PROTONIX) 40 MG tablet Take 1 tablet (40 mg total) by mouth daily. 90 tablet 1   tadalafil (CIALIS) 20 MG tablet Take 1 tablet (20 mg total) by mouth daily as needed for erectile dysfunction. 20 tablet 5   thiamine (VITAMIN B-1) 100 MG tablet Take 1 tablet (100 mg total) by mouth daily. 100 tablet 1   triazolam (HALCION) 0.25 MG tablet TAKE 1 TABLET (0.25 MG TOTAL) BY MOUTH AT BEDTIME AS NEEDED. FOR SLEEP 90 tablet 0   Turmeric 450 MG CAPS Take 1 capsule by mouth 2 (two) times daily with a meal. 180 capsule 3   Tiotropium Bromide-Olodaterol (STIOLTO RESPIMAT) 2.5-2.5 MCG/ACT AERS Inhale 2 puffs into the lungs daily. (Patient not taking: Reported on 03/09/2019) 1 Inhaler 5   No facility-administered medications prior to visit.     ROS Review of Systems  Constitutional: Negative for diaphoresis and fatigue.  HENT: Negative.  Negative for trouble  swallowing.   Eyes: Negative for visual disturbance.  Respiratory: Negative for cough, chest tightness, shortness of breath and wheezing.   Cardiovascular: Negative for chest pain, palpitations and leg swelling.  Gastrointestinal: Negative for abdominal pain, constipation, diarrhea, nausea and vomiting.  Endocrine: Negative.   Genitourinary: Negative.  Negative for difficulty urinating.  Musculoskeletal: Positive for arthralgias. Negative for back pain and myalgias.  Skin: Negative.  Negative for color change and pallor.  Neurological: Negative for dizziness, weakness and light-headedness.  Hematological: Negative for adenopathy. Does not bruise/bleed easily.  Psychiatric/Behavioral: Negative.     Objective:  BP 132/82 (BP Location: Left Arm, Patient Position: Sitting, Cuff Size: Large)    Pulse 74    Temp 97.9 F (36.6 C) (Oral)    Resp 16    Ht 6\' 2"  (1.88 m)    Wt 283 lb (128.4 kg)    SpO2 96%    BMI 36.34 kg/m   BP Readings from Last 3 Encounters:  03/09/19 132/82  12/09/18 (!) 154/96  05/12/18 120/80    Wt Readings from Last 3 Encounters:  03/09/19 283 lb (128.4 kg)  12/09/18 255 lb 12 oz (116 kg)  05/12/18 251 lb 8 oz (114.1 kg)    Physical Exam Vitals signs reviewed.  Constitutional:      Appearance: He is obese. He is not ill-appearing or diaphoretic.  HENT:  Nose: Nose normal.     Mouth/Throat:     Mouth: Mucous membranes are moist.     Pharynx: No oropharyngeal exudate.  Eyes:     General: No scleral icterus.    Conjunctiva/sclera: Conjunctivae normal.  Neck:     Musculoskeletal: Normal range of motion. No neck rigidity or muscular tenderness.  Cardiovascular:     Rate and Rhythm: Normal rate and regular rhythm.     Heart sounds: No murmur.  Pulmonary:     Effort: Pulmonary effort is normal.     Breath sounds: Normal breath sounds. No stridor. No wheezing, rhonchi or rales.  Abdominal:     General: Abdomen is flat.     Palpations: There is no  hepatomegaly, splenomegaly or mass.     Tenderness: There is no abdominal tenderness. There is no guarding.  Musculoskeletal: Normal range of motion.        General: No swelling.     Comments: There is no evidence of synovitis in any of his large, medium, or small joints.  The only joint abnormality I notice is Heberden's nodes in his digits.  Lymphadenopathy:     Cervical: No cervical adenopathy.  Skin:    General: Skin is warm and dry.     Findings: No erythema or rash.  Neurological:     General: No focal deficit present.     Lab Results  Component Value Date   WBC 9.3 05/14/2018   HGB 14.9 05/14/2018   HCT 44.5 05/14/2018   PLT 155.0 05/14/2018   GLUCOSE 100 (H) 03/09/2019   CHOL 183 05/14/2018   TRIG 209.0 (H) 05/14/2018   HDL 38.60 (L) 05/14/2018   LDLDIRECT 118.0 05/14/2018   LDLCALC 85 12/18/2016   ALT 19 05/14/2018   AST 23 05/14/2018   NA 140 03/09/2019   K 4.3 03/09/2019   CL 106 03/09/2019   CREATININE 1.00 03/09/2019   BUN 27 (H) 03/09/2019   CO2 25 03/09/2019   TSH 2.30 12/09/2018   PSA 0.39 05/14/2018   INR 0.88 03/25/2014   HGBA1C 6.0 12/09/2018   MICROALBUR 4.2 (H) 12/30/2007    Ct Head Wo Contrast  Result Date: 03/26/2014 CLINICAL DATA:  Follow-up hemorrhage. EXAM: CT HEAD WITHOUT CONTRAST TECHNIQUE: Contiguous axial images were obtained from the base of the skull through the vertex without intravenous contrast. COMPARISON:  CT HEAD W/O CM dated 03/25/2014 FINDINGS: Mild asymmetric density along the right cerebellar tentorium consistent with subdural hematoma. Evolving left inferior frontal hemorrhagic contusion with surrounding low-density vasogenic edema and decreased hemorrhagic component. Bilateral holohemispheric low-density extra-axial fluid collections, largest on the right measuring 5 mm with slight mass effect on the subjacent sulci. No residual subarachnoid blood. No hydrocephalus. No acute large vascular territory infarct. Basal cisterns are  patent. Ocular globes and orbital contents are unremarkable. Remote right medial orbital blowout fracture. Mastoid air cells are well aerated. Nondisplaced right occipital skull fracture extending to the condyle. Small right parietal scalp hematoma without subcutaneous gas or radiopaque foreign bodies. IMPRESSION: Small residual subdural hematoma along the right cerebellar tentorium, evolving left inferior frontal lobe hemorrhagic contusion with stable small bilateral holohemispheric hygromas versus degenerated subdural hematomas. Resolution of subarachnoid blood without hydrocephalus. Nondisplaced right occipital skull fracture. Electronically Signed   By: Elon Alas   On: 03/26/2014 06:59   Ct Head (brain) Wo Contrast  Result Date: 03/25/2014 CLINICAL DATA:  Acute onset of severe headache 1 hr ago. Skull fracture with subdural, subarachnoid and parenchymal hemorrhage in [place],  [place] 10 days ago. EXAM: CT HEAD WITHOUT CONTRAST TECHNIQUE: Contiguous axial images were obtained from the base of the skull through the vertex without intravenous contrast. COMPARISON:  12/10/2012. FINDINGS: Right tentorial subdural hematoma. This appears acute. There is also a small amount of high density subarachnoid hemorrhage in the left frontal region which also appears acute. Small area of high density hemorrhage in the left parietal-occipital subarachnoid space. Also noted is white matter low density in the left corona region which was not previously present. There are also interval low density subdural fluid collections, measuring 12 mm in maximum thickness on the left and 10 mm in maximum thickness on the right. Normal size and position of the ventricles. Nondisplaced right occipital skull fracture with a small overlying scalp hematoma. No intraventricular hemorrhage is seen. Normally pneumatized paranasal sinuses. IMPRESSION: 1. Probable acute right tentorial subdural hematoma. 2. Probable acute left frontal and  left parietal occipital subarachnoid hemorrhage. 3. Interval left frontal encephalomalacia. 4. Interval bilateral subdural hygromas. 5. Nondisplaced right occipital bone fracture. These results were called by telephone at the time of interpretation on 03/25/2014 at 3:09 AM to Dr. Nicole Kindred, who verbally acknowledged these results. Electronically Signed   By: Enrique Sack M.D.   On: 03/25/2014 03:13    Assessment & Plan:   Wyn was seen today for osteoarthritis and hypertension.  Diagnoses and all orders for this visit:  Essential hypertension- His blood pressure is adequately well controlled.  Electrolytes and renal function are normal. -     Basic metabolic panel; Future  Primary osteoarthritis of both knees -     meloxicam (MOBIC) 15 MG tablet; Take 1 tablet (15 mg total) by mouth daily.  Primary osteoarthritis of both hands -     meloxicam (MOBIC) 15 MG tablet; Take 1 tablet (15 mg total) by mouth daily.   I have discontinued Earvin Blazier. Clipper's Tiotropium Bromide-Olodaterol. I am also having him start on meloxicam. Additionally, I am having him maintain his Turmeric, Omega-3 Fatty Acids (OMEGA-3 FISH OIL PO), multivitamin, thiamine, pantoprazole, tadalafil, Vitamin D3, triazolam, and olmesartan.  Meds ordered this encounter  Medications   meloxicam (MOBIC) 15 MG tablet    Sig: Take 1 tablet (15 mg total) by mouth daily.    Dispense:  90 tablet    Refill:  1     Follow-up: Return in about 6 months (around 09/08/2019).  Scarlette Calico, MD

## 2019-03-09 NOTE — Patient Instructions (Signed)

## 2019-04-20 ENCOUNTER — Other Ambulatory Visit: Payer: Self-pay | Admitting: Internal Medicine

## 2019-04-20 DIAGNOSIS — E559 Vitamin D deficiency, unspecified: Secondary | ICD-10-CM

## 2019-04-28 DIAGNOSIS — Z4651 Encounter for fitting and adjustment of gastric lap band: Secondary | ICD-10-CM | POA: Diagnosis not present

## 2019-05-19 ENCOUNTER — Other Ambulatory Visit: Payer: Self-pay | Admitting: Internal Medicine

## 2019-05-19 DIAGNOSIS — F5101 Primary insomnia: Secondary | ICD-10-CM

## 2019-05-25 ENCOUNTER — Other Ambulatory Visit: Payer: Self-pay | Admitting: Internal Medicine

## 2019-05-25 DIAGNOSIS — K2100 Gastro-esophageal reflux disease with esophagitis, without bleeding: Secondary | ICD-10-CM

## 2019-07-12 ENCOUNTER — Other Ambulatory Visit: Payer: Self-pay | Admitting: Internal Medicine

## 2019-07-12 DIAGNOSIS — E559 Vitamin D deficiency, unspecified: Secondary | ICD-10-CM

## 2019-08-21 ENCOUNTER — Other Ambulatory Visit: Payer: Self-pay | Admitting: Internal Medicine

## 2019-08-21 DIAGNOSIS — I10 Essential (primary) hypertension: Secondary | ICD-10-CM

## 2019-08-28 ENCOUNTER — Other Ambulatory Visit: Payer: Self-pay | Admitting: Internal Medicine

## 2019-08-28 DIAGNOSIS — M19041 Primary osteoarthritis, right hand: Secondary | ICD-10-CM

## 2019-08-28 DIAGNOSIS — M170 Bilateral primary osteoarthritis of knee: Secondary | ICD-10-CM

## 2019-08-28 DIAGNOSIS — M19042 Primary osteoarthritis, left hand: Secondary | ICD-10-CM

## 2019-09-01 ENCOUNTER — Other Ambulatory Visit: Payer: Self-pay

## 2019-09-01 ENCOUNTER — Ambulatory Visit: Payer: Self-pay

## 2019-09-01 ENCOUNTER — Encounter: Payer: Self-pay | Admitting: Internal Medicine

## 2019-09-01 ENCOUNTER — Ambulatory Visit (INDEPENDENT_AMBULATORY_CARE_PROVIDER_SITE_OTHER): Payer: BC Managed Care – PPO | Admitting: Internal Medicine

## 2019-09-01 VITALS — BP 98/64 | HR 67 | Temp 98.4°F | Ht 74.0 in | Wt 229.8 lb

## 2019-09-01 DIAGNOSIS — I10 Essential (primary) hypertension: Secondary | ICD-10-CM

## 2019-09-01 NOTE — Progress Notes (Signed)
Subjective:    Patient ID: [name] [name], male    DOB: 1956/01/18, 63 y.o.   MRN: [id number]  HPI The patient is here for an acute visit.  For the past several months he has been working on weight loss.  He walks normal 6-numeral 12 miles a day and has lost over 50 pounds.  6 weeks ago he started feeling lightheaded.  He passed out a few weeks ago at home - he was lightehdad prior to passing out.Marland Kitchen  He was out briefly.  The past few weeks the lightheadedness has increased.  Has been tired the past few days.  Yesterday he was at the gym and walked 4 miles.  He left to go home and stopped at the gas station and was extremely lightheaded.  He had to sit down for a while before he was able to go home.  When he did get home he started checking his blood pressure and it was on the low side.  This did happen to him before when he lost a lot of weight he was able to go off of his blood pressure medication and he does not think he needs his current blood pressure medication anymore.  He did not take his medication this morning.  His blood pressure measurements from last night to this morning are:  7pm - 60/48 7: 30  63/49 8: 15 79/53 11pm  97/68 6: 39 am -- 85/61 8 am  115/81  He denies any chest pain, pain in his arms, palpitations, shortness of breath.  He denies headaches.  He has not had any cold symptoms or other concerning symptoms.  He states he does feel better today since not taking his medication.    Medications and allergies reviewed with patient and updated if appropriate.  Patient Active Problem List   Diagnosis Date Noted  . Primary osteoarthritis of both hands 03/09/2019  . Cannabis abuse 12/13/2018  . COPD (chronic obstructive pulmonary disease) with acute bronchitis ([employer]) 12/09/2018  . LAP-BAND surgery status 02/13/2015  . Vitamin D deficiency 05/10/2014  . Thiamine deficiency 05/10/2014  . Routine general medical examination at a health care facility 05/05/2014  . TBI  (traumatic brain injury) ([place]) 03/20/2014  . Primary insomnia 07/04/2010  . Hypothyroidism 04/05/2009  . DJD (degenerative joint disease) of knee 05/30/2008  . Hyperglycemia 03/26/2007  . Hyperlipidemia with target LDL less than 100 03/26/2007  . OBESITY 03/26/2007  . Essential hypertension 03/26/2007  . GERD 03/26/2007    Current Outpatient Medications on File Prior to Visit  Medication Sig Dispense Refill  . Cholecalciferol (VITAMIN D3) 1.25 MG (50000 UT) CAPS TAKE 1 CAPSULE BY MOUTH ONE TIME PER WEEK 12 capsule 0  . meloxicam (MOBIC) 15 MG tablet Take 1 tablet (15 mg total) by mouth daily. **OFFICE VISIT IS DUE** 90 tablet 0  . Multiple Vitamin (MULTIVITAMIN) tablet Take 1 tablet by mouth daily.    Marland Kitchen olmesartan (BENICAR) 20 MG tablet TAKE 1 TABLET BY MOUTH EVERY DAY 90 tablet 1  . Omega-3 Fatty Acids (OMEGA-3 FISH OIL PO) Take 2 tablets by mouth daily.    . pantoprazole (PROTONIX) 40 MG tablet TAKE 1 TABLET BY MOUTH EVERY DAY 90 tablet 1  . tadalafil (CIALIS) 20 MG tablet Take 1 tablet (20 mg total) by mouth daily as needed for erectile dysfunction. 20 tablet 5  . thiamine (VITAMIN B-1) 100 MG tablet Take 1 tablet (100 mg total) by mouth daily. 100 tablet 1  . triazolam (  HALCION) 0.25 MG tablet TAKE 1 TABLET (0.25 MG TOTAL) BY MOUTH AT BEDTIME AS NEEDED. FOR SLEEP 90 tablet 1  . Turmeric 450 MG CAPS Take 1 capsule by mouth 2 (two) times daily with a meal. 180 capsule 3   No current facility-administered medications on file prior to visit.     Past Medical History:  Diagnosis Date  . Allergy   . Diabetes mellitus    type 2-off all meds now after wt loss  . GERD (gastroesophageal reflux disease)   . Hyperlipidemia   . Hyperlipidemia   . Hypertension   . Osteoarthritis    hands, mild, not disabling    Past Surgical History:  Procedure Laterality Date  . Arthroscopy X 1 left ([name])     X 2 right  . COLONOSCOPY    . correction of deviated septum    . GANGLION CYST  EXCISION     right wrist ([facility])  . GASTRIC BANDING PORT REVISION  8/11  . PILONIDAL CYST / SINUS EXCISION  63 yrs old  . TONSILLECTOMY    . ULNAR COLLATERAL LIGAMENT REPAIR Right 09/21/2013   Procedure: ULNAR COLLATERAL LIGAMENT REPAIR METACARPAL PHALANGE RIGHT THUMB POSSIBLE APL GRAFT RECONSTRUCTION;  Surgeon: Wynonia Sours, MD;  Location: [facility];  Service: Orthopedics;  Laterality: Right;    Social History   Socioeconomic History  . Marital status: Married    Spouse name: Not on file  . Number of children: 2  . Years of education: 61  . Highest education level: Not on file  Occupational History  . Occupation: Tourist information centre manager: [employer]  . Financial resource strain: Not on file  . Food insecurity    Worry: Not on file    Inability: Not on file  . Transportation needs    Medical: Not on file    Non-medical: Not on file  Tobacco Use  . Smoking status: Former Smoker    Quit date: 06/13/1995    Years since quitting: 24.2  . Smokeless tobacco: Never Used  Substance and Sexual Activity  . Alcohol use: No    Alcohol/week: 0.0 standard drinks  . Drug use: No  . Sexual activity: Yes    Partners: Female  Lifestyle  . Physical activity    Days per week: Not on file    Minutes per session: Not on file  . Stress: Not on file  Relationships  . Social Herbalist on phone: Not on file    Gets together: Not on file    Attends religious service: Not on file    Active member of club or organization: Not on file    Attends meetings of clubs or organizations: Not on file    Relationship status: Not on file  Other Topics Concern  . Not on file  Social History Narrative   HSG, [employer] business. Married '76  2 sons - '77, '80. Marriage in good health. [place], plus a lot business. Very active and feeling.     Family History  Problem Relation Age of Onset  . Cancer Father        lung cancer metatstatic cancer to  brain  . Coronary artery disease Father   . Heart attack Father        X 2 (41,45)  . Heart disease Father        CAD/MI x 2  . Cancer Mother  rapid on set  . Hypertension Mother   . Parkinsonism Maternal Grandfather   . Diabetes Neg Hx   . Colon cancer Neg Hx   . Colon polyps Neg Hx   . Esophageal cancer Neg Hx   . Rectal cancer Neg Hx   . Stomach cancer Neg Hx     Review of Systems  Constitutional: Negative for chills and fever.  Respiratory: Negative for cough, shortness of breath and wheezing.   Cardiovascular: Negative for chest pain and palpitations.  Neurological: Positive for light-headedness. Negative for headaches.       Objective:   Vitals:   09/01/19 0941  BP: 98/64  Pulse: 67  Temp: 98.4 F (36.9 C)  SpO2: 97%   BP Readings from Last 3 Encounters:  09/01/19 98/64  03/09/19 132/82  12/09/18 (!) 154/96   Wt Readings from Last 3 Encounters:  09/01/19 229 lb 12 oz (104.2 kg)  03/09/19 283 lb (128.4 kg)  12/09/18 255 lb 12 oz (116 kg)   Body mass index is 29.5 kg/m.   Physical Exam    Constitutional: Appears well-developed and well-nourished. No distress.  HENT:  Head: Normocephalic and atraumatic.  Neck: Neck supple. No tracheal deviation present. No thyromegaly present.  No cervical lymphadenopathy Cardiovascular: Normal rate, regular rhythm and normal heart sounds.   No murmur heard. No carotid bruit .  No edema Pulmonary/Chest: Effort normal and breath sounds normal. No respiratory distress. No has no wheezes. No rales.  Skin: Skin is warm and dry. Not diaphoretic.  Psychiatric: Normal mood and affect. Behavior is normal.       Assessment & Plan:    See Problem List for Assessment and Plan of chronic medical problems.

## 2019-09-01 NOTE — Telephone Encounter (Signed)
Pt. Reports he has lost 60 lbs. And has been working out. Has noticed his BP has been dropping. On high BP medicine. Last night after working out, bp was 63/49 and came up to 97/68. This morning at 0630 - 85/61 and now 115/81 pulse 73. Warm transfer to Tammy in the practice for a visit. Answer Assessment - Initial Assessment Questions 1. BLOOD PRESSURE: "What is the blood pressure?" "Did you take at least two measurements 5 minutes apart?"     97/68 LAST night, this morning  85/61    0810  115/81 2. ONSET: "When did you take your blood pressure?"     Last night and this morning 3. HOW: "How did you obtain the blood pressure?" (e.g., visiting nurse, automatic home BP monitor)     Home monitor 4. HISTORY: "Do you have a history of low blood pressure?" "What is your blood pressure normally?"     Happened when he lost weight 5. MEDICATIONS: "Are you taking any medications for blood pressure?" If yes: "Have they been changed recently?"     Yes - has taken this morning 6. PULSE RATE: "Do you know what your pulse rate is?"      73 7. OTHER SYMPTOMS: "Have you been sick recently?" "Have you had a recent injury?"     No 8. PREGNANCY: "Is there any chance you are pregnant?" "When was your last menstrual period?"     n/a  Protocols used: LOW BLOOD PRESSURE-A-AH

## 2019-09-01 NOTE — Patient Instructions (Signed)
Stop the Olmesartan.  Increase your fluids.   Monitor your BP at home and let us know your numbers.  Your lightheadedness should resolve over the next few days and if it does not let us know.

## 2019-09-01 NOTE — Assessment & Plan Note (Signed)
He has lost a tremendous amount of weight intentionally He no longer needs his blood pressure medication and is currently lightheaded because of hypotension He did not take his medication today and we will officially discontinue it He will monitor his blood pressure at home and send [place] readings He will call with any questions or concerns

## 2019-09-01 NOTE — Telephone Encounter (Signed)
Pt was seen by Dr. Quay Burow.

## 2019-09-13 ENCOUNTER — Other Ambulatory Visit: Payer: Self-pay | Admitting: Gastroenterology

## 2019-09-13 DIAGNOSIS — K743 Primary biliary cirrhosis: Secondary | ICD-10-CM

## 2019-10-28 ENCOUNTER — Other Ambulatory Visit: Payer: Self-pay | Admitting: Internal Medicine

## 2019-10-28 DIAGNOSIS — E559 Vitamin D deficiency, unspecified: Secondary | ICD-10-CM

## 2019-10-30 ENCOUNTER — Other Ambulatory Visit: Payer: Self-pay | Admitting: Internal Medicine

## 2019-10-30 DIAGNOSIS — E559 Vitamin D deficiency, unspecified: Secondary | ICD-10-CM

## 2019-11-13 ENCOUNTER — Other Ambulatory Visit: Payer: Self-pay | Admitting: Internal Medicine

## 2019-11-13 DIAGNOSIS — K2100 Gastro-esophageal reflux disease with esophagitis, without bleeding: Secondary | ICD-10-CM

## 2019-11-15 ENCOUNTER — Other Ambulatory Visit: Payer: Self-pay | Admitting: Internal Medicine

## 2019-11-15 DIAGNOSIS — F5101 Primary insomnia: Secondary | ICD-10-CM

## 2019-11-28 ENCOUNTER — Other Ambulatory Visit: Payer: Self-pay | Admitting: Internal Medicine

## 2019-11-28 DIAGNOSIS — M170 Bilateral primary osteoarthritis of knee: Secondary | ICD-10-CM

## 2019-11-28 DIAGNOSIS — M19041 Primary osteoarthritis, right hand: Secondary | ICD-10-CM

## 2019-11-28 DIAGNOSIS — M19042 Primary osteoarthritis, left hand: Secondary | ICD-10-CM

## 2019-12-04 ENCOUNTER — Other Ambulatory Visit: Payer: Self-pay | Admitting: Internal Medicine

## 2019-12-04 DIAGNOSIS — N5201 Erectile dysfunction due to arterial insufficiency: Secondary | ICD-10-CM

## 2020-01-19 ENCOUNTER — Ambulatory Visit: Payer: BC Managed Care – PPO | Attending: Internal Medicine

## 2020-01-19 DIAGNOSIS — Z23 Encounter for immunization: Secondary | ICD-10-CM

## 2020-01-19 NOTE — Progress Notes (Signed)
   Covid-19 Vaccination Clinic  Name:  [name] [name]    MRN: [id number] DOB: February 07, 1956  01/19/2020  [name] was observed post Covid-19 immunization for 15 minutes without incident. He was provided with Vaccine Information Sheet and instruction to access the V-Safe system.   [name] was instructed to call 911 with any severe reactions post vaccine: Marland Kitchen Difficulty breathing  . Swelling of face and throat  . A fast heartbeat  . A bad rash all over body  . Dizziness and weakness   Immunizations Administered    Name Date Dose VIS Date Route   Pfizer COVID-19 Vaccine 01/19/2020  2:21 PM 0.3 mL 10/21/2019 Intramuscular   Manufacturer: [employer]   Lot: [id number]   [place]: [id number]

## 2020-01-21 ENCOUNTER — Other Ambulatory Visit: Payer: Self-pay | Admitting: Internal Medicine

## 2020-01-21 DIAGNOSIS — E559 Vitamin D deficiency, unspecified: Secondary | ICD-10-CM

## 2020-01-23 ENCOUNTER — Other Ambulatory Visit: Payer: Self-pay | Admitting: Internal Medicine

## 2020-01-23 DIAGNOSIS — F5101 Primary insomnia: Secondary | ICD-10-CM

## 2020-02-13 ENCOUNTER — Ambulatory Visit: Payer: BC Managed Care – PPO | Attending: Internal Medicine

## 2020-02-13 DIAGNOSIS — Z23 Encounter for immunization: Secondary | ICD-10-CM

## 2020-02-13 NOTE — Progress Notes (Signed)
   Covid-19 Vaccination Clinic  Name:  [name] [name]    MRN: [id number] DOB: 10-01-1956  02/13/2020  [name] [name] was observed post Covid-19 immunization for 15 minutes without incident. He was provided with Vaccine Information Sheet and instruction to access the V-Safe system.   [name] [name] was instructed to call 911 with any severe reactions post vaccine: Marland Kitchen Difficulty breathing  . Swelling of face and throat  . A fast heartbeat  . A bad rash all over body  . Dizziness and weakness   Immunizations Administered    Name Date Dose VIS Date Route   Pfizer COVID-19 Vaccine 02/13/2020 10:00 AM 0.3 mL 10/21/2019 Intramuscular   Manufacturer: [employer]   Lot: [id number]   [place]: [id number]

## 2020-02-23 ENCOUNTER — Encounter: Payer: Self-pay | Admitting: Internal Medicine

## 2020-02-23 ENCOUNTER — Ambulatory Visit: Payer: BC Managed Care – PPO | Admitting: Internal Medicine

## 2020-02-23 ENCOUNTER — Telehealth: Payer: Self-pay

## 2020-02-23 ENCOUNTER — Other Ambulatory Visit: Payer: Self-pay

## 2020-02-23 VITALS — BP 138/82 | HR 68 | Temp 97.8°F | Resp 16 | Ht 74.0 in | Wt 224.0 lb

## 2020-02-23 DIAGNOSIS — M19041 Primary osteoarthritis, right hand: Secondary | ICD-10-CM

## 2020-02-23 DIAGNOSIS — R7989 Other specified abnormal findings of blood chemistry: Secondary | ICD-10-CM

## 2020-02-23 DIAGNOSIS — M170 Bilateral primary osteoarthritis of knee: Secondary | ICD-10-CM

## 2020-02-23 DIAGNOSIS — R739 Hyperglycemia, unspecified: Secondary | ICD-10-CM | POA: Diagnosis not present

## 2020-02-23 DIAGNOSIS — E785 Hyperlipidemia, unspecified: Secondary | ICD-10-CM | POA: Diagnosis not present

## 2020-02-23 DIAGNOSIS — F5101 Primary insomnia: Secondary | ICD-10-CM

## 2020-02-23 DIAGNOSIS — Z0000 Encounter for general adult medical examination without abnormal findings: Secondary | ICD-10-CM | POA: Diagnosis not present

## 2020-02-23 DIAGNOSIS — I10 Essential (primary) hypertension: Secondary | ICD-10-CM

## 2020-02-23 DIAGNOSIS — Z9884 Bariatric surgery status: Secondary | ICD-10-CM | POA: Diagnosis not present

## 2020-02-23 DIAGNOSIS — E519 Thiamine deficiency, unspecified: Secondary | ICD-10-CM

## 2020-02-23 DIAGNOSIS — M19042 Primary osteoarthritis, left hand: Secondary | ICD-10-CM

## 2020-02-23 DIAGNOSIS — E559 Vitamin D deficiency, unspecified: Secondary | ICD-10-CM

## 2020-02-23 DIAGNOSIS — D508 Other iron deficiency anemias: Secondary | ICD-10-CM

## 2020-02-23 MED ORDER — TRIAZOLAM 0.25 MG PO TABS: 0.2500 mg | ORAL_TABLET | Freq: Every evening | ORAL | 1 refills | Status: DC | PRN

## 2020-02-23 NOTE — Progress Notes (Signed)
Subjective:  Patient ID: [name] [name], male    DOB: [birth year]  Age: 64 y.o. MRN: [id number]  CC: Annual Exam  This visit occurred during the SARS-CoV-2 public health emergency.  Safety protocols were in place, including screening questions prior to the visit, additional usage of staff PPE, and extensive cleaning of exam room while observing appropriate contact time as indicated for disinfecting solutions.    HPI [name] [name] presents for a CPX.  He works out on a treadmill several times a week and does not experience chest pain, shortness of breath, diaphoresis, dizziness, lightheadedness, edema, or fatigue.  Outpatient Medications Prior to Visit  Medication Sig Dispense Refill  . meloxicam (MOBIC) 15 MG tablet TAKE 1 TABLET (15 MG TOTAL) BY MOUTH DAILY. **OFFICE VISIT IS DUE** 90 tablet 0  . Multiple Vitamin (MULTIVITAMIN) tablet Take 1 tablet by mouth daily.    . Omega-3 Fatty Acids (OMEGA-3 FISH OIL PO) Take 2 tablets by mouth daily.    . pantoprazole (PROTONIX) 40 MG tablet TAKE 1 TABLET BY MOUTH EVERY DAY 90 tablet 1  . tadalafil (CIALIS) 20 MG tablet TAKE 1 TABLET BY MOUTH EVERY DAY AS NEEDED FOR ERECTILE DYSFUNCTION 20 tablet 5  . thiamine (VITAMIN B-1) 100 MG tablet Take 1 tablet (100 mg total) by mouth daily. 100 tablet 1  . Turmeric 450 MG CAPS Take 1 capsule by mouth 2 (two) times daily with a meal. 180 capsule 3  . Cholecalciferol (VITAMIN D3) 1.25 MG (50000 UT) CAPS TAKE 1 CAPSULE BY MOUTH ONE TIME PER WEEK 12 capsule 0  . triazolam (HALCION) 0.25 MG tablet TAKE 1 TABLET (0.25 MG TOTAL) BY MOUTH AT BEDTIME AS NEEDED. FOR SLEEP 90 tablet 0   No facility-administered medications prior to visit.    ROS Review of Systems  Constitutional: Negative for appetite change, diaphoresis, fatigue and unexpected weight change.  HENT: Negative.   Eyes: Negative.   Respiratory: Negative.  Negative for cough, chest tightness, shortness of breath and wheezing.   Cardiovascular:  Negative for chest pain, palpitations and leg swelling.  Gastrointestinal: Negative for abdominal pain, blood in stool, diarrhea, nausea and vomiting.  Endocrine: Negative.   Genitourinary: Negative.  Negative for difficulty urinating, scrotal swelling and testicular pain.  Musculoskeletal: Positive for arthralgias. Negative for back pain and myalgias.  Skin: Negative.  Negative for color change and pallor.  Neurological: Negative.  Negative for dizziness and weakness.  Hematological: Negative.  Negative for adenopathy. Does not bruise/bleed easily.  Psychiatric/Behavioral: Positive for sleep disturbance. Negative for decreased concentration and dysphoric mood. The patient is not nervous/anxious.   All other systems reviewed and are negative.   Objective:  BP 138/82 (BP Location: Left Arm, Patient Position: Sitting, Cuff Size: Large)   Pulse 68   Temp 97.8 F (36.6 C) (Oral)   Resp 16   Ht 6\' 2"  (1.88 m)   Wt 224 lb (101.6 kg)   SpO2 97%   BMI 28.76 kg/m   BP Readings from Last 3 Encounters:  02/23/20 138/82  09/01/19 98/64  03/09/19 132/82    Wt Readings from Last 3 Encounters:  02/23/20 224 lb (101.6 kg)  09/01/19 229 lb 12 oz (104.2 kg)  03/09/19 283 lb (128.4 kg)    Physical Exam Vitals reviewed. Exam conducted with a chaperone present.  Constitutional:      Appearance: Normal appearance.  HENT:     Nose: Nose normal.     Mouth/Throat:     Mouth: Mucous membranes  are moist.  Eyes:     General: No scleral icterus.    Conjunctiva/sclera: Conjunctivae normal.  Cardiovascular:     Rate and Rhythm: Normal rate and regular rhythm.     Pulses: Normal pulses.     Heart sounds: No murmur. No gallop.   Pulmonary:     Effort: Pulmonary effort is normal.     Breath sounds: No stridor. No wheezing, rhonchi or rales.  Abdominal:     General: Abdomen is flat.     Palpations: There is no mass.     Tenderness: There is no abdominal tenderness. There is no guarding.    Genitourinary:    Pubic Area: No rash.      Penis: Normal. No discharge, swelling or lesions.      Testes: Normal.        Right: Mass, tenderness or swelling not present.        Left: Mass, tenderness or swelling not present.     Epididymis:     Right: Not inflamed or enlarged.     Left: Not inflamed or enlarged.     Prostate: Normal. Not enlarged, not tender and no nodules present.     Rectum: Normal. Guaiac result negative. No mass, tenderness, anal fissure, external hemorrhoid or internal hemorrhoid. Normal anal tone.  Musculoskeletal:     Cervical back: Neck supple.     Right knee: Deformity (DJD) present. No swelling, erythema, bony tenderness or crepitus. Normal range of motion. No tenderness.     Left knee: Deformity (DJD) present. No swelling or bony tenderness. Normal range of motion. No tenderness.  Lymphadenopathy:     Cervical: No cervical adenopathy.  Skin:    Comments: Excessive tanning  Neurological:     Mental Status: He is alert.     Lab Results  Component Value Date   WBC 10.3 02/24/2020   HGB 13.3 02/24/2020   HCT 40.4 02/24/2020   PLT 149.0 (L) 02/24/2020   GLUCOSE 110 (H) 02/24/2020   CHOL 168 02/24/2020   TRIG 135.0 02/24/2020   HDL 48.70 02/24/2020   LDLDIRECT 118.0 05/14/2018   LDLCALC 92 02/24/2020   ALT 67 (H) 02/24/2020   AST 215 (H) 02/24/2020   NA 145 02/24/2020   K 4.8 02/24/2020   CL 109 02/24/2020   CREATININE 0.92 02/24/2020   BUN 19 02/24/2020   CO2 28 02/24/2020   TSH 2.62 02/24/2020   PSA 0.39 02/24/2020   INR 0.88 03/25/2014   HGBA1C 5.7 02/24/2020   MICROALBUR 4.2 (H) 12/30/2007    CT Head Wo Contrast  Result Date: 03/26/2014 CLINICAL DATA:  Follow-up hemorrhage. EXAM: CT HEAD WITHOUT CONTRAST TECHNIQUE: Contiguous axial images were obtained from the base of the skull through the vertex without intravenous contrast. COMPARISON:  CT HEAD W/O CM dated 03/25/2014 FINDINGS: Mild asymmetric density along the right cerebellar  tentorium consistent with subdural hematoma. Evolving left inferior frontal hemorrhagic contusion with surrounding low-density vasogenic edema and decreased hemorrhagic component. Bilateral holohemispheric low-density extra-axial fluid collections, largest on the right measuring 5 mm with slight mass effect on the subjacent sulci. No residual subarachnoid blood. No hydrocephalus. No acute large vascular territory infarct. Basal cisterns are patent. Ocular globes and orbital contents are unremarkable. Remote right medial orbital blowout fracture. Mastoid air cells are well aerated. Nondisplaced right occipital skull fracture extending to the condyle. Small right parietal scalp hematoma without subcutaneous gas or radiopaque foreign bodies. IMPRESSION: Small residual subdural hematoma along the right cerebellar tentorium, evolving left  inferior frontal lobe hemorrhagic contusion with stable small bilateral holohemispheric hygromas versus degenerated subdural hematomas. Resolution of subarachnoid blood without hydrocephalus. Nondisplaced right occipital skull fracture. Electronically Signed   By: Elon Alas   On: 03/26/2014 06:59   CT Head (Brain) Wo Contrast  Result Date: 03/25/2014 CLINICAL DATA:  Acute onset of severe headache 1 hr ago. Skull fracture with subdural, subarachnoid and parenchymal hemorrhage in [place], [place] 10 days ago. EXAM: CT HEAD WITHOUT CONTRAST TECHNIQUE: Contiguous axial images were obtained from the base of the skull through the vertex without intravenous contrast. COMPARISON:  12/10/2012. FINDINGS: Right tentorial subdural hematoma. This appears acute. There is also a small amount of high density subarachnoid hemorrhage in the left frontal region which also appears acute. Small area of high density hemorrhage in the left parietal-occipital subarachnoid space. Also noted is white matter low density in the left corona region which was not previously present. There are also  interval low density subdural fluid collections, measuring 12 mm in maximum thickness on the left and 10 mm in maximum thickness on the right. Normal size and position of the ventricles. Nondisplaced right occipital skull fracture with a small overlying scalp hematoma. No intraventricular hemorrhage is seen. Normally pneumatized paranasal sinuses. IMPRESSION: 1. Probable acute right tentorial subdural hematoma. 2. Probable acute left frontal and left parietal occipital subarachnoid hemorrhage. 3. Interval left frontal encephalomalacia. 4. Interval bilateral subdural hygromas. 5. Nondisplaced right occipital bone fracture. These results were called by telephone at the time of interpretation on 03/25/2014 at 3:09 AM to Dr. Nicole Kindred, who verbally acknowledged these results. Electronically Signed   By: Enrique Sack M.D.   On: 03/25/2014 03:13    Assessment & Plan:   Valgene was seen today for annual exam.  Diagnoses and all orders for this visit:  Essential hypertension-his blood pressure is adequately well controlled. -     Basic metabolic panel; Future -     TSH; Future -     Urinalysis, Routine w reflex microscopic; Future -     CBC with Differential/Platelet; Future  Hyperglycemia-his blood sugar is normal now. -     Basic metabolic panel; Future -     Hemoglobin A1c; Future  Hyperlipidemia with target LDL less than 100-  He has achieved his LDL goal and is doing well on the statin. -     TSH; Future -     Hepatic function panel; Future  LAP-BAND surgery status- I will screen him for vitamin deficiencies. -     IBC panel; Future -     Vitamin B12; Future -     Vitamin B1; Future -     VITAMIN D 25 Hydroxy (Vit-D Deficiency, Fractures); Future -     Zinc; Future -     Folate; Future -     Ferritin; Future  Routine general medical examination at a health care facility- Exam completed, labs reviewed, vaccines reviewed and none were given since he just received his COVID-19 vaccine, colon  cancer screening is up-to-date, patient education was given. -     Lipid panel; Future -     PSA; Future  Thiamine deficiency- his thiamine level is normal now -     Vitamin B1; Future -     CBC with Differential/Platelet; Future  Vitamin D deficiency- his vit D level is too high. I have asked him to stop taking Vit D supplements. -     VITAMIN D 25 Hydroxy (Vit-D Deficiency, Fractures); Future  Primary insomnia -  triazolam (HALCION) 0.25 MG tablet; Take 1 tablet (0.25 mg total) by mouth at bedtime as needed.  Elevated LFTs- He has a history of NASH o an U/S done nearly 10 years ago.  I have asked him to undergo another ultrasound to confirm this.  If the ultrasound is positive for steatohepatitis then I will consider recommending pioglitazone. -     US Abdomen Limited RUQ; Future  Iron deficiency anemia due to dietary causes- His iron level is extremely low.  He has undergone lap band so I do not think he would absorb enough oral iron.  I have therefore recommended that he undergo a series of iron infusions.  Primary osteoarthritis of both knees- His pain is well controlled with meloxicam.  Primary osteoarthritis of both hands   I have discontinued [name] [name]'s Vitamin D3. I have also changed his triazolam. Additionally, I am having him maintain his Turmeric, Omega-3 Fatty Acids (OMEGA-3 FISH OIL PO), multivitamin, thiamine, pantoprazole, meloxicam, and tadalafil.  Meds ordered this encounter  Medications  . triazolam (HALCION) 0.25 MG tablet    Sig: Take 1 tablet (0.25 mg total) by mouth at bedtime as needed.    Dispense:  90 tablet    Refill:  1    This request is for a new prescription for a controlled substance as required by Federal/State law..   In addition to time spent on CPE, I spent 50 minutes in preparing to see the patient by review of recent labs, imaging and procedures, obtaining and reviewing separately obtained history, communicating with the patient and  family or caregiver, ordering medications, tests or procedures, and documenting clinical information in the EHR including the differential Dx, treatment, and any further evaluation and other management of 1. Essential hypertension 2. Hyperglycemia 3. Hyperlipidemia with target LDL less than 100 4. LAP-BAND surgery status 5. Thiamine deficiency 6. Vitamin D deficiency 8. Primary insomnia 9. Elevated LFTs 10. Iron deficiency anemia due to dietary causes 11. Primary osteoarthritis of both knees    Follow-up: Return in about 6 months (around 08/24/2020).  Scarlette Calico, MD

## 2020-02-23 NOTE — Patient Instructions (Signed)

## 2020-02-24 ENCOUNTER — Other Ambulatory Visit: Payer: BC Managed Care – PPO

## 2020-02-24 ENCOUNTER — Encounter: Payer: Self-pay | Admitting: Internal Medicine

## 2020-02-24 DIAGNOSIS — Z9884 Bariatric surgery status: Secondary | ICD-10-CM

## 2020-02-24 DIAGNOSIS — Z0000 Encounter for general adult medical examination without abnormal findings: Secondary | ICD-10-CM

## 2020-02-24 DIAGNOSIS — E519 Thiamine deficiency, unspecified: Secondary | ICD-10-CM

## 2020-02-24 DIAGNOSIS — R739 Hyperglycemia, unspecified: Secondary | ICD-10-CM

## 2020-02-24 DIAGNOSIS — I10 Essential (primary) hypertension: Secondary | ICD-10-CM

## 2020-02-24 DIAGNOSIS — E559 Vitamin D deficiency, unspecified: Secondary | ICD-10-CM

## 2020-02-24 DIAGNOSIS — E785 Hyperlipidemia, unspecified: Secondary | ICD-10-CM

## 2020-02-24 DIAGNOSIS — D508 Other iron deficiency anemias: Secondary | ICD-10-CM | POA: Insufficient documentation

## 2020-02-24 DIAGNOSIS — R7989 Other specified abnormal findings of blood chemistry: Secondary | ICD-10-CM | POA: Insufficient documentation

## 2020-02-24 LAB — CBC WITH DIFFERENTIAL/PLATELET
Basophils Absolute: 0 10*3/uL (ref 0.0–0.1)
Basophils Relative: 0.3 % (ref 0.0–3.0)
Eosinophils Absolute: 0.2 10*3/uL (ref 0.0–0.7)
Eosinophils Relative: 1.7 % (ref 0.0–5.0)
HCT: 40.4 % (ref 39.0–52.0)
Hemoglobin: 13.3 g/dL (ref 13.0–17.0)
Lymphocytes Relative: 22.5 % (ref 12.0–46.0)
Lymphs Abs: 2.3 10*3/uL (ref 0.7–4.0)
MCHC: 32.9 g/dL (ref 30.0–36.0)
MCV: 88.7 fl (ref 78.0–100.0)
Monocytes Absolute: 1 10*3/uL (ref 0.1–1.0)
Monocytes Relative: 9.5 % (ref 3.0–12.0)
Neutro Abs: 6.8 10*3/uL (ref 1.4–7.7)
Neutrophils Relative %: 66 % (ref 43.0–77.0)
Platelets: 149 10*3/uL — ABNORMAL LOW (ref 150.0–400.0)
RBC: 4.56 Mil/uL (ref 4.22–5.81)
RDW: 14 % (ref 11.5–15.5)
WBC: 10.3 10*3/uL (ref 4.0–10.5)

## 2020-02-24 LAB — LIPID PANEL
Cholesterol: 168 mg/dL (ref 0–200)
HDL: 48.7 mg/dL (ref >39.00)
LDL Cholesterol: 92 mg/dL (ref 0–99)
NonHDL: 119.23
Total CHOL/HDL Ratio: 3
Triglycerides: 135 mg/dL (ref 0.0–149.0)
VLDL: 27 mg/dL (ref 0.0–40.0)

## 2020-02-24 LAB — URINALYSIS, ROUTINE W REFLEX MICROSCOPIC
Hgb urine dipstick: NEGATIVE
Ketones, ur: NEGATIVE
Leukocytes,Ua: NEGATIVE
Nitrite: NEGATIVE
Specific Gravity, Urine: >=1.03 — AB (ref 1.000–1.030)
Total Protein, Urine: 30 — AB
Urine Glucose: NEGATIVE
Urobilinogen, UA: 0.2 (ref 0.0–1.0)
pH: 6 (ref 5.0–8.0)

## 2020-02-24 LAB — HEPATIC FUNCTION PANEL
ALT: 67 U/L — ABNORMAL HIGH (ref 0–53)
AST: 215 U/L — ABNORMAL HIGH (ref 0–37)
Albumin: 3.8 g/dL (ref 3.5–5.2)
Alkaline Phosphatase: 77 U/L (ref 39–117)
Bilirubin, Direct: 0.1 mg/dL (ref 0.0–0.3)
Total Bilirubin: 0.9 mg/dL (ref 0.2–1.2)
Total Protein: 6.5 g/dL (ref 6.0–8.3)

## 2020-02-24 LAB — FERRITIN: Ferritin: 123.1 ng/mL (ref 22.0–322.0)

## 2020-02-24 LAB — IBC PANEL
Iron: <10 ug/dL — ABNORMAL LOW (ref 42–165)
Saturation Ratios: 3.3 % — ABNORMAL LOW (ref 20.0–50.0)
Transferrin: 214 mg/dL (ref 212.0–360.0)

## 2020-02-24 LAB — BASIC METABOLIC PANEL
BUN: 19 mg/dL (ref 6–23)
CO2: 28 mEq/L (ref 19–32)
Calcium: 8.9 mg/dL (ref 8.4–10.5)
Chloride: 109 mEq/L (ref 96–112)
Creatinine, Ser: 0.92 mg/dL (ref 0.40–1.50)
GFR: 82.65 mL/min (ref >60.00)
Glucose, Bld: 110 mg/dL — ABNORMAL HIGH (ref 70–99)
Potassium: 4.8 mEq/L (ref 3.5–5.1)
Sodium: 145 mEq/L (ref 135–145)

## 2020-02-24 LAB — PSA: PSA: 0.39 ng/mL (ref 0.10–4.00)

## 2020-02-24 LAB — VITAMIN B12: Vitamin B-12: 297 pg/mL (ref 211–911)

## 2020-02-24 LAB — TSH: TSH: 2.62 u[IU]/mL (ref 0.35–4.50)

## 2020-02-24 LAB — VITAMIN D 25 HYDROXY (VIT D DEFICIENCY, FRACTURES): VITD: 107.79 ng/mL (ref 30.00–100.00)

## 2020-02-24 LAB — FOLATE: Folate: 16.2 ng/mL (ref >5.9)

## 2020-02-24 LAB — HEMOGLOBIN A1C: Hgb A1c MFr Bld: 5.7 % (ref 4.6–6.5)

## 2020-02-28 ENCOUNTER — Ambulatory Visit
Admission: RE | Admit: 2020-02-28 | Discharge: 2020-02-28 | Disposition: A | Discharge status: Home / Self Care | Payer: BC Managed Care – PPO | Source: Ambulatory Visit | Attending: Internal Medicine | Admitting: Internal Medicine

## 2020-02-28 ENCOUNTER — Other Ambulatory Visit: Payer: Self-pay

## 2020-02-28 ENCOUNTER — Encounter: Payer: Self-pay | Admitting: Internal Medicine

## 2020-02-28 DIAGNOSIS — R7989 Other specified abnormal findings of blood chemistry: Secondary | ICD-10-CM | POA: Diagnosis not present

## 2020-02-29 ENCOUNTER — Other Ambulatory Visit: Payer: Self-pay | Admitting: Internal Medicine

## 2020-02-29 DIAGNOSIS — R7989 Other specified abnormal findings of blood chemistry: Secondary | ICD-10-CM

## 2020-02-29 LAB — VITAMIN B1: Vitamin B1 (Thiamine): 29 nmol/L (ref 8–30)

## 2020-02-29 LAB — ZINC: Zinc: 59 ug/dL — ABNORMAL LOW (ref 60–130)

## 2020-03-01 ENCOUNTER — Other Ambulatory Visit: Payer: Self-pay

## 2020-03-01 ENCOUNTER — Other Ambulatory Visit: Payer: BC Managed Care – PPO

## 2020-03-01 ENCOUNTER — Other Ambulatory Visit: Payer: Self-pay | Admitting: Internal Medicine

## 2020-03-01 ENCOUNTER — Encounter: Payer: Self-pay | Admitting: Internal Medicine

## 2020-03-01 DIAGNOSIS — E60 Dietary zinc deficiency: Secondary | ICD-10-CM

## 2020-03-01 DIAGNOSIS — R7989 Other specified abnormal findings of blood chemistry: Secondary | ICD-10-CM | POA: Diagnosis not present

## 2020-03-01 DIAGNOSIS — Z9884 Bariatric surgery status: Secondary | ICD-10-CM

## 2020-03-01 LAB — PROTIME-INR
INR: 1.2 ratio — ABNORMAL HIGH (ref 0.8–1.0)
Prothrombin Time: 12.9 s (ref 9.6–13.1)

## 2020-03-01 LAB — HEPATIC FUNCTION PANEL
ALT: 35 U/L (ref 0–53)
AST: 31 U/L (ref 0–37)
Albumin: 3.8 g/dL (ref 3.5–5.2)
Alkaline Phosphatase: 53 U/L (ref 39–117)
Bilirubin, Direct: 0.2 mg/dL (ref 0.0–0.3)
Total Bilirubin: 0.9 mg/dL (ref 0.2–1.2)
Total Protein: 7 g/dL (ref 6.0–8.3)

## 2020-03-01 MED ORDER — ZINC GLUCONATE 50 MG PO TABS: 50.0000 mg | ORAL_TABLET | ORAL | 1 refills | Status: DC

## 2020-03-02 LAB — HEPATITIS B SURFACE ANTIBODY,QUALITATIVE: Hep B S Ab: NONREACTIVE

## 2020-03-02 LAB — HEPATITIS C ANTIBODY
Hepatitis C Ab: NONREACTIVE
SIGNAL TO CUT-OFF: 0.02 (ref <1.00)

## 2020-03-02 LAB — HEPATITIS A ANTIBODY, TOTAL: Hepatitis A AB,Total: NONREACTIVE

## 2020-03-02 LAB — HEPATITIS B SURFACE ANTIGEN: Hepatitis B Surface Ag: NONREACTIVE

## 2020-03-02 LAB — HEPATITIS B CORE ANTIBODY, TOTAL: Hep B Core Total Ab: NONREACTIVE

## 2020-03-03 ENCOUNTER — Encounter: Payer: Self-pay | Admitting: Internal Medicine

## 2020-03-05 NOTE — Telephone Encounter (Signed)
Called patient and he is scheduled for 03/07/2020 for hep A and hep B shots

## 2020-03-07 ENCOUNTER — Ambulatory Visit (INDEPENDENT_AMBULATORY_CARE_PROVIDER_SITE_OTHER): Payer: BC Managed Care – PPO | Admitting: *Deleted

## 2020-03-07 ENCOUNTER — Other Ambulatory Visit: Payer: Self-pay

## 2020-03-07 DIAGNOSIS — Z23 Encounter for immunization: Secondary | ICD-10-CM | POA: Diagnosis not present

## 2020-03-08 ENCOUNTER — Other Ambulatory Visit: Payer: Self-pay | Admitting: Internal Medicine

## 2020-03-08 DIAGNOSIS — M170 Bilateral primary osteoarthritis of knee: Secondary | ICD-10-CM

## 2020-03-08 DIAGNOSIS — M19041 Primary osteoarthritis, right hand: Secondary | ICD-10-CM

## 2020-03-08 DIAGNOSIS — M19042 Primary osteoarthritis, left hand: Secondary | ICD-10-CM

## 2020-03-21 ENCOUNTER — Telehealth: Payer: Self-pay

## 2020-03-21 ENCOUNTER — Other Ambulatory Visit: Payer: Self-pay

## 2020-03-21 ENCOUNTER — Ambulatory Visit (HOSPITAL_COMMUNITY)
Admission: RE | Admit: 2020-03-21 | Discharge: 2020-03-21 | Disposition: A | Discharge status: Home / Self Care | Payer: BC Managed Care – PPO | Source: Ambulatory Visit | Attending: Internal Medicine | Admitting: Internal Medicine

## 2020-03-21 DIAGNOSIS — D508 Other iron deficiency anemias: Secondary | ICD-10-CM | POA: Insufficient documentation

## 2020-03-21 DIAGNOSIS — Z9884 Bariatric surgery status: Secondary | ICD-10-CM | POA: Diagnosis not present

## 2020-03-21 MED ORDER — SODIUM CHLORIDE 0.9 % IV SOLN
INTRAVENOUS | Status: DC | PRN
  Administered 2020-03-21: 250 mL via INTRAVENOUS

## 2020-03-21 MED ORDER — SODIUM CHLORIDE 0.9 % IV SOLN: 750.0000 mg | Freq: Once | INTRAVENOUS | Status: DC

## 2020-03-21 MED ORDER — SODIUM CHLORIDE 0.9 % IV SOLN
510.0000 mg | INTRAVENOUS | Status: DC
  Administered 2020-03-21: 10:00:00 510 mg via INTRAVENOUS
  Filled 2020-03-21: qty 510

## 2020-03-21 NOTE — Discharge Instructions (Signed)

## 2020-03-21 NOTE — Progress Notes (Signed)
PATIENT CARE CENTER NOTE  Diagnosis: LAP-BAND surgery status (Z98.84); Iron deficiency anemia due to dietary causes (D50.8)    Provider: Scarlette Calico, MD   Procedure: IV Feraheme    Note: Patient received Feraheme via PIV. Tolerated well with no adverse reaction. Observed patient for 30 minutes post-infusion. Vital signs stable. Discharge instructions given. Patient to come back next week for second dose. Alert, oriented and ambulatory at discharge.

## 2020-03-21 NOTE — Telephone Encounter (Signed)
Pt formulary does not allow for injectafer due no previous allergy to other iron products and pt will not have an MRI in next 2 months. [facility] inpatient pharmacy called and stated that they can change injectafer to ferahem. Spoke to Dr. Quay Burow and given verbal okay for formulary change.

## 2020-03-28 ENCOUNTER — Other Ambulatory Visit: Payer: Self-pay

## 2020-03-28 ENCOUNTER — Telehealth: Payer: Self-pay | Admitting: Internal Medicine

## 2020-03-28 ENCOUNTER — Ambulatory Visit (HOSPITAL_COMMUNITY)
Admission: RE | Admit: 2020-03-28 | Discharge: 2020-03-28 | Disposition: A | Discharge status: Home / Self Care | Payer: BC Managed Care – PPO | Source: Ambulatory Visit | Attending: Internal Medicine | Admitting: Internal Medicine

## 2020-03-28 DIAGNOSIS — D508 Other iron deficiency anemias: Secondary | ICD-10-CM | POA: Diagnosis not present

## 2020-03-28 DIAGNOSIS — Z9884 Bariatric surgery status: Secondary | ICD-10-CM | POA: Diagnosis not present

## 2020-03-28 MED ORDER — SODIUM CHLORIDE 0.9 % IV SOLN
INTRAVENOUS | Status: DC | PRN
  Administered 2020-03-28: 250 mL via INTRAVENOUS

## 2020-03-28 MED ORDER — SODIUM CHLORIDE 0.9 % IV SOLN
510.0000 mg | Freq: Once | INTRAVENOUS | Status: AC
  Administered 2020-03-28: 510 mg via INTRAVENOUS
  Filled 2020-03-28: qty 510

## 2020-03-28 NOTE — Discharge Instructions (Signed)

## 2020-03-28 NOTE — Progress Notes (Signed)
0830 Pt arrived at the [name]) for a feraheme infusion. Pt A&Ox4, ambulatory on arrival. IV started, temp noted at 99.9 temporal, other vital signs are stable. Awaiting med from pharmacy. WCTM.   [id number] Feraheme infusion started, WCTM.   0915 Infusion completed, pt declined to wait for observation of s/s of reaction, vital signs stable, temp now 99.1. IV removed without difficulty, RN reviewed discharge instructions with pt, pt understands without assistance. Pt A&Ox4, ambulatory at time of discharge from the [facility].

## 2020-04-06 ENCOUNTER — Other Ambulatory Visit: Payer: Self-pay

## 2020-04-06 ENCOUNTER — Ambulatory Visit (INDEPENDENT_AMBULATORY_CARE_PROVIDER_SITE_OTHER): Payer: BC Managed Care – PPO | Admitting: *Deleted

## 2020-04-06 DIAGNOSIS — Z23 Encounter for immunization: Secondary | ICD-10-CM | POA: Diagnosis not present

## 2020-04-16 NOTE — Telephone Encounter (Signed)
Error

## 2020-05-04 ENCOUNTER — Other Ambulatory Visit: Payer: Self-pay | Admitting: Internal Medicine

## 2020-05-04 DIAGNOSIS — K2100 Gastro-esophageal reflux disease with esophagitis, without bleeding: Secondary | ICD-10-CM

## 2020-05-27 ENCOUNTER — Other Ambulatory Visit: Payer: Self-pay | Admitting: Internal Medicine

## 2020-05-27 DIAGNOSIS — M19042 Primary osteoarthritis, left hand: Secondary | ICD-10-CM

## 2020-05-27 DIAGNOSIS — M170 Bilateral primary osteoarthritis of knee: Secondary | ICD-10-CM

## 2020-05-27 DIAGNOSIS — M19041 Primary osteoarthritis, right hand: Secondary | ICD-10-CM

## 2020-07-30 ENCOUNTER — Other Ambulatory Visit: Payer: Self-pay | Admitting: Internal Medicine

## 2020-07-30 DIAGNOSIS — E559 Vitamin D deficiency, unspecified: Secondary | ICD-10-CM

## 2020-08-19 ENCOUNTER — Other Ambulatory Visit: Payer: Self-pay | Admitting: Internal Medicine

## 2020-08-19 DIAGNOSIS — K2100 Gastro-esophageal reflux disease with esophagitis, without bleeding: Secondary | ICD-10-CM

## 2020-08-19 DIAGNOSIS — Z9884 Bariatric surgery status: Secondary | ICD-10-CM

## 2020-08-19 DIAGNOSIS — M170 Bilateral primary osteoarthritis of knee: Secondary | ICD-10-CM

## 2020-08-19 DIAGNOSIS — E60 Dietary zinc deficiency: Secondary | ICD-10-CM

## 2020-08-19 DIAGNOSIS — M19041 Primary osteoarthritis, right hand: Secondary | ICD-10-CM

## 2020-08-19 DIAGNOSIS — E559 Vitamin D deficiency, unspecified: Secondary | ICD-10-CM

## 2020-08-20 ENCOUNTER — Other Ambulatory Visit: Payer: Self-pay | Admitting: Internal Medicine

## 2020-08-20 DIAGNOSIS — F5101 Primary insomnia: Secondary | ICD-10-CM

## 2020-09-06 ENCOUNTER — Ambulatory Visit (INDEPENDENT_AMBULATORY_CARE_PROVIDER_SITE_OTHER): Payer: PPO

## 2020-09-06 ENCOUNTER — Other Ambulatory Visit: Payer: Self-pay

## 2020-09-06 DIAGNOSIS — Z23 Encounter for immunization: Secondary | ICD-10-CM | POA: Diagnosis not present

## 2020-10-10 DIAGNOSIS — J869 Pyothorax without fistula: Secondary | ICD-10-CM

## 2020-10-10 HISTORY — DX: Pyothorax without fistula: J86.9

## 2020-10-18 ENCOUNTER — Encounter (HOSPITAL_BASED_OUTPATIENT_CLINIC_OR_DEPARTMENT_OTHER): Payer: Self-pay

## 2020-10-18 ENCOUNTER — Emergency Department (HOSPITAL_BASED_OUTPATIENT_CLINIC_OR_DEPARTMENT_OTHER)
Admission: EM | Admit: 2020-10-18 | Discharge: 2020-10-18 | Disposition: A | Discharge status: Home / Self Care | Payer: PPO | Attending: Emergency Medicine | Admitting: Emergency Medicine

## 2020-10-18 ENCOUNTER — Other Ambulatory Visit (HOSPITAL_BASED_OUTPATIENT_CLINIC_OR_DEPARTMENT_OTHER): Payer: Self-pay | Admitting: Emergency Medicine

## 2020-10-18 ENCOUNTER — Emergency Department (HOSPITAL_BASED_OUTPATIENT_CLINIC_OR_DEPARTMENT_OTHER): Payer: PPO

## 2020-10-18 ENCOUNTER — Other Ambulatory Visit: Payer: Self-pay

## 2020-10-18 DIAGNOSIS — J181 Lobar pneumonia, unspecified organism: Secondary | ICD-10-CM | POA: Insufficient documentation

## 2020-10-18 DIAGNOSIS — J449 Chronic obstructive pulmonary disease, unspecified: Secondary | ICD-10-CM | POA: Insufficient documentation

## 2020-10-18 DIAGNOSIS — Z20822 Contact with and (suspected) exposure to covid-19: Secondary | ICD-10-CM | POA: Insufficient documentation

## 2020-10-18 DIAGNOSIS — D72829 Elevated white blood cell count, unspecified: Secondary | ICD-10-CM | POA: Diagnosis not present

## 2020-10-18 DIAGNOSIS — Z79899 Other long term (current) drug therapy: Secondary | ICD-10-CM | POA: Diagnosis not present

## 2020-10-18 DIAGNOSIS — Z87891 Personal history of nicotine dependence: Secondary | ICD-10-CM | POA: Diagnosis not present

## 2020-10-18 DIAGNOSIS — R059 Cough, unspecified: Secondary | ICD-10-CM | POA: Diagnosis not present

## 2020-10-18 DIAGNOSIS — J90 Pleural effusion, not elsewhere classified: Secondary | ICD-10-CM | POA: Diagnosis not present

## 2020-10-18 DIAGNOSIS — J189 Pneumonia, unspecified organism: Secondary | ICD-10-CM | POA: Diagnosis not present

## 2020-10-18 DIAGNOSIS — I10 Essential (primary) hypertension: Secondary | ICD-10-CM | POA: Diagnosis not present

## 2020-10-18 DIAGNOSIS — R509 Fever, unspecified: Secondary | ICD-10-CM | POA: Diagnosis not present

## 2020-10-18 DIAGNOSIS — E039 Hypothyroidism, unspecified: Secondary | ICD-10-CM | POA: Diagnosis not present

## 2020-10-18 DIAGNOSIS — E1165 Type 2 diabetes mellitus with hyperglycemia: Secondary | ICD-10-CM | POA: Diagnosis not present

## 2020-10-18 LAB — CBC WITH DIFFERENTIAL/PLATELET
Abs Immature Granulocytes: 0.54 10*3/uL — ABNORMAL HIGH (ref 0.00–0.07)
Basophils Absolute: 0.1 10*3/uL (ref 0.0–0.1)
Basophils Relative: 0 %
Eosinophils Absolute: 0.1 10*3/uL (ref 0.0–0.5)
Eosinophils Relative: 0 %
HCT: 36.7 % — ABNORMAL LOW (ref 39.0–52.0)
Hemoglobin: 12.1 g/dL — ABNORMAL LOW (ref 13.0–17.0)
Immature Granulocytes: 3 %
Lymphocytes Relative: 7 %
Lymphs Abs: 1.6 10*3/uL (ref 0.7–4.0)
MCH: 28.7 pg (ref 26.0–34.0)
MCHC: 33 g/dL (ref 30.0–36.0)
MCV: 87 fL (ref 80.0–100.0)
Monocytes Absolute: 1.8 10*3/uL — ABNORMAL HIGH (ref 0.1–1.0)
Monocytes Relative: 8 %
Neutro Abs: 17.8 10*3/uL — ABNORMAL HIGH (ref 1.7–7.7)
Neutrophils Relative %: 82 %
Platelets: 290 10*3/uL (ref 150–400)
RBC: 4.22 MIL/uL (ref 4.22–5.81)
RDW: 13.5 % (ref 11.5–15.5)
WBC: 21.8 10*3/uL — ABNORMAL HIGH (ref 4.0–10.5)
nRBC: 0 % (ref 0.0–0.2)

## 2020-10-18 LAB — COMPREHENSIVE METABOLIC PANEL
ALT: 37 U/L (ref 0–44)
AST: 35 U/L (ref 15–41)
Albumin: 2.3 g/dL — ABNORMAL LOW (ref 3.5–5.0)
Alkaline Phosphatase: 98 U/L (ref 38–126)
Anion gap: 11 (ref 5–15)
BUN: 18 mg/dL (ref 8–23)
CO2: 26 mmol/L (ref 22–32)
Calcium: 8.5 mg/dL — ABNORMAL LOW (ref 8.9–10.3)
Chloride: 103 mmol/L (ref 98–111)
Creatinine, Ser: 0.81 mg/dL (ref 0.61–1.24)
GFR, Estimated: >60 mL/min (ref >60)
Glucose, Bld: 147 mg/dL — ABNORMAL HIGH (ref 70–99)
Potassium: 3.6 mmol/L (ref 3.5–5.1)
Sodium: 140 mmol/L (ref 135–145)
Total Bilirubin: 0.9 mg/dL (ref 0.3–1.2)
Total Protein: 6.5 g/dL (ref 6.5–8.1)

## 2020-10-18 LAB — RESP PANEL BY RT-PCR (FLU A&B, COVID) ARPGX2
Influenza A by PCR: NEGATIVE
Influenza B by PCR: NEGATIVE
SARS Coronavirus 2 by RT PCR: NEGATIVE

## 2020-10-18 LAB — TROPONIN I (HIGH SENSITIVITY): Troponin I (High Sensitivity): 5 ng/L (ref <18)

## 2020-10-18 MED ORDER — SODIUM CHLORIDE 0.9 % IV SOLN: INTRAVENOUS | Status: DC | PRN

## 2020-10-18 MED ORDER — SODIUM CHLORIDE 0.9 % IV SOLN
1.0000 g | Freq: Once | INTRAVENOUS | Status: AC
  Administered 2020-10-18: 1 g via INTRAVENOUS
  Filled 2020-10-18: qty 10

## 2020-10-18 MED ORDER — AZITHROMYCIN 250 MG PO TABS: 250.0000 mg | ORAL_TABLET | Freq: Every day | ORAL | 0 refills | Status: DC

## 2020-10-18 MED ORDER — SODIUM CHLORIDE 0.9 % IV SOLN
500.0000 mg | Freq: Once | INTRAVENOUS | Status: AC
  Administered 2020-10-18: 500 mg via INTRAVENOUS
  Filled 2020-10-18: qty 500

## 2020-10-18 MED ORDER — CEFPODOXIME PROXETIL 200 MG PO TABS: 200.0000 mg | ORAL_TABLET | Freq: Two times a day (BID) | ORAL | 0 refills | Status: DC

## 2020-10-18 MED FILL — AZITHROMYCIN 250 MG TABLET: 250 | 5 days supply | Qty: 6 | Fill #0

## 2020-10-18 MED FILL — CEFUROXIME AXETIL 500 MG TA: 500 | 7 days supply | Qty: 14 | Fill #0

## 2020-10-18 NOTE — Discharge Instructions (Addendum)
Take antibiotic as prescribed.  Follow-up with your primary doctor for close recheck, ideally to be seen in the next few days.  At some point in the next couple weeks, you should have a repeat chest x-ray to ensure clearing of your left lung and rule out any underlying masses.  If at any point you develop difficulty in breathing, fevers, passing out or other new concerning symptom, return to ER for reassessment.

## 2020-10-18 NOTE — ED Notes (Signed)
Pt maintained SpO2 levels between 94-97% when ambulating down hallway. Denies shortness of breath.

## 2020-10-18 NOTE — ED Triage Notes (Signed)
Pt reports cough with low grade fever and body aches since yesterday.

## 2020-10-18 NOTE — ED Provider Notes (Signed)
[facility] EMERGENCY DEPARTMENT Provider Note   CSN: [id number] Arrival date & time: 10/18/20  7209     History Chief Complaint  Patient presents with  . Cough    [name] [name] is a 64 y.o. male.  Diabetes, hypertension, hyperlipidemia.  Presents to ER with concern for cough, malaise, low-grade fever.  Symptoms ongoing for the past day or 2.  Chills.  Low-grade fever.  Feels somewhat short of breath, worse with exertion.  No associated chest pain.  Cough has yellow phlegm.  No blood.  Denies known Covid exposure.  Remote history of smoking.  HPI     Past Medical History:  Diagnosis Date  . Allergy   . Diabetes mellitus    type 2-off all meds now after wt loss  . GERD (gastroesophageal reflux disease)   . Hyperlipidemia   . Hyperlipidemia   . Hypertension   . Osteoarthritis    hands, mild, not disabling    Patient Active Problem List   Diagnosis Date Noted  . Zinc deficiency 03/01/2020  . Elevated LFTs 02/24/2020  . Iron deficiency anemia due to dietary causes 02/24/2020  . Primary osteoarthritis of both hands 03/09/2019  . Cannabis abuse 12/13/2018  . COPD (chronic obstructive pulmonary disease) with acute bronchitis ([place]) 12/09/2018  . LAP-BAND surgery status 02/13/2015  . Vitamin D deficiency 05/10/2014  . Thiamine deficiency 05/10/2014  . Routine general medical examination at a health care facility 05/05/2014  . TBI (traumatic brain injury) ([place]) 03/20/2014  . Primary insomnia 07/04/2010  . Hypothyroidism 04/05/2009  . DJD (degenerative joint disease) of knee 05/30/2008  . Hyperglycemia 03/26/2007  . Hyperlipidemia with target LDL less than 100 03/26/2007  . OBESITY 03/26/2007  . Essential hypertension 03/26/2007  . GERD 03/26/2007    Past Surgical History:  Procedure Laterality Date  . Arthroscopy X 1 left ([name])     X 2 right  . COLONOSCOPY    . correction of deviated septum    . GANGLION CYST EXCISION     right wrist ([place])  .  GASTRIC BANDING PORT REVISION  8/11  . PILONIDAL CYST / SINUS EXCISION  64 yrs old  . TONSILLECTOMY    . ULNAR COLLATERAL LIGAMENT REPAIR Right 09/21/2013   Procedure: ULNAR COLLATERAL LIGAMENT REPAIR METACARPAL PHALANGE RIGHT THUMB POSSIBLE APL GRAFT RECONSTRUCTION;  Surgeon: Wynonia Sours, MD;  Location: [facility];  Service: Orthopedics;  Laterality: Right;       Family History  Problem Relation Age of Onset  . Cancer Father        lung cancer metatstatic cancer to brain  . Coronary artery disease Father   . Heart attack Father        X 2 (41,45)  . Heart disease Father        CAD/MI x 2  . Cancer Mother        rapid on set  . Hypertension Mother   . Parkinsonism Maternal Grandfather   . Diabetes Neg Hx   . Colon cancer Neg Hx   . Colon polyps Neg Hx   . Esophageal cancer Neg Hx   . Rectal cancer Neg Hx   . Stomach cancer Neg Hx     Social History   Tobacco Use  . Smoking status: Former Smoker    Quit date: 06/13/1995    Years since quitting: 25.3  . Smokeless tobacco: Never Used  Substance Use Topics  . Alcohol use: No    Alcohol/week: 0.0  standard drinks  . Drug use: No    Home Medications Prior to Admission medications   Medication Sig Start Date End Date Taking? Authorizing Provider  azithromycin (ZITHROMAX) 250 MG tablet Take 1 tablet (250 mg total) by mouth daily. Take first 2 tablets together, then 1 every day until finished. 10/18/20   Lucrezia Starch, MD  cefpodoxime (VANTIN) 200 MG tablet Take 1 tablet (200 mg total) by mouth 2 (two) times daily for 7 days. 10/18/20 10/25/20  Lucrezia Starch, MD  [facility] ZINC GLUCONATE 50 MG tablet TAKE 1 TABLET (50 MG TOTAL) BY MOUTH EVERY OTHER DAY. 08/19/20   Janith Lima, MD  meloxicam (MOBIC) 15 MG tablet TAKE 1 TABLET (15 MG TOTAL) BY MOUTH DAILY. **OFFICE VISIT IS DUE** 08/19/20   Janith Lima, MD  Multiple Vitamin (MULTIVITAMIN) tablet Take 1 tablet by mouth daily.    [provider]   Omega-3 Fatty Acids (OMEGA-3 FISH OIL PO) Take 2 tablets by mouth daily.    [provider]  pantoprazole (PROTONIX) 40 MG tablet TAKE 1 TABLET BY MOUTH EVERY DAY 08/19/20   Janith Lima, MD  tadalafil (CIALIS) 20 MG tablet TAKE 1 TABLET BY MOUTH EVERY DAY AS NEEDED FOR ERECTILE DYSFUNCTION 12/04/19   Janith Lima, MD  thiamine (VITAMIN B-1) 100 MG tablet Take 1 tablet (100 mg total) by mouth daily. 12/09/18   Janith Lima, MD  triazolam (HALCION) 0.25 MG tablet TAKE 1 TABLET (0.25 MG TOTAL) BY MOUTH AT BEDTIME AS NEEDED. 08/20/20   Janith Lima, MD  Turmeric 450 MG CAPS Take 1 capsule by mouth 2 (two) times daily with a meal. 02/13/15   Janith Lima, MD  Cholecalciferol (VITAMIN D3) 1.25 MG (50000 UT) CAPS TAKE 1 CAPSULE BY MOUTH ONE TIME PER WEEK 10/30/19   Janith Lima, MD  meloxicam (MOBIC) 15 MG tablet TAKE 1 TABLET (15 MG TOTAL) BY MOUTH DAILY. **OFFICE VISIT IS DUE** 03/08/20   Janith Lima, MD  pantoprazole (PROTONIX) 40 MG tablet TAKE 1 TABLET BY MOUTH EVERY DAY 11/13/19   Janith Lima, MD  tadalafil (CIALIS) 20 MG tablet Take 1 tablet (20 mg total) by mouth daily as needed for erectile dysfunction. 12/09/18   Janith Lima, MD    Allergies    Penicillins and Codeine  Review of Systems   Review of Systems  Constitutional: Positive for chills, fatigue and fever.  HENT: Negative for ear pain and sore throat.   Eyes: Negative for pain and visual disturbance.  Respiratory: Positive for cough and shortness of breath.   Cardiovascular: Negative for chest pain and palpitations.  Gastrointestinal: Negative for abdominal pain and vomiting.  Genitourinary: Negative for dysuria and hematuria.  Musculoskeletal: Negative for arthralgias and back pain.  Skin: Negative for color change and rash.  Neurological: Negative for seizures and syncope.  All other systems reviewed and are negative.   Physical Exam Updated Vital Signs BP 129/78 (BP Location: Right Arm)    Pulse 75   Temp 97.9 F (36.6 C) (Oral)   Resp (!) 21   Ht 6\' 2"  (1.88 m)   Wt 85.3 kg   SpO2 97%   BMI 24.14 kg/m   Physical Exam Vitals and nursing note reviewed.  Constitutional:      Appearance: He is well-developed and well-nourished.  HENT:     Head: Normocephalic and atraumatic.  Eyes:     Conjunctiva/sclera: Conjunctivae normal.  Cardiovascular:  Rate and Rhythm: Normal rate and regular rhythm.     Heart sounds: No murmur heard.   Pulmonary:     Comments: Diminished breath sounds on left side, no wheeze, some crackles; on right, lungs cta, no wheeze or crackles Abdominal:     Palpations: Abdomen is soft.     Tenderness: There is no abdominal tenderness.  Musculoskeletal:        General: No edema.     Cervical back: Neck supple.  Skin:    General: Skin is warm and dry.  Neurological:     General: No focal deficit present.     Mental Status: He is alert and oriented to person, place, and time.  Psychiatric:        Mood and Affect: Mood and affect and mood normal.     ED Results / Procedures / Treatments   Labs (all labs ordered are listed, but only abnormal results are displayed) Labs Reviewed  CBC WITH DIFFERENTIAL/PLATELET - Abnormal; Notable for the following components:      Result Value   WBC 21.8 (*)    Hemoglobin 12.1 (*)    HCT 36.7 (*)    Neutro Abs 17.8 (*)    Monocytes Absolute 1.8 (*)    Abs Immature Granulocytes 0.54 (*)    All other components within normal limits  COMPREHENSIVE METABOLIC PANEL - Abnormal; Notable for the following components:   Glucose, Bld 147 (*)    Calcium 8.5 (*)    Albumin 2.3 (*)    All other components within normal limits  RESP PANEL BY RT-PCR (FLU A&B, COVID) [id number]  TROPONIN I (HIGH SENSITIVITY)    EKG EKG Interpretation  Date/Time:  Thursday October 18 2020 08:48:05 EST Ventricular Rate:  75 PR Interval:    QRS Duration: 101 QT Interval:  396 QTC Calculation: 443 R Axis:   26 Text  Interpretation: Sinus rhythm Ventricular premature complex Confirmed by Madalyn Rob [phone]) on 10/18/2020 9:05:43 AM   Radiology DG Chest Portable 1 View  Result Date: 10/18/2020 CLINICAL DATA:  Cough, fever. EXAM: PORTABLE CHEST 1 VIEW COMPARISON:  September 27, 2011. FINDINGS: No pneumothorax is noted. Right lung is clear. Large left midlung and basilar opacity is noted concerning for pneumonia or atelectasis with associated pleural effusion. Underlying mass cannot be excluded. Old left rib fractures are noted. IMPRESSION: Large left midlung and basilar opacity is noted concerning for pneumonia or atelectasis with associated pleural effusion. Underlying mass cannot be excluded. Followup PA and lateral chest X-ray is recommended in 3-4 weeks following trial of antibiotic therapy to ensure resolution and exclude underlying malignancy. Electronically Signed   By: Marijo Conception M.D.   On: 10/18/2020 08:37    Procedures Procedures (including critical care time)  Medications Ordered in ED Medications  cefTRIAXone (ROCEPHIN) 1 g in sodium chloride 0.9 % 100 mL IVPB (0 g Intravenous Stopped 10/18/20 0941)  azithromycin (ZITHROMAX) 500 mg in sodium chloride 0.9 % 250 mL IVPB (0 mg Intravenous Stopped 10/18/20 1115)    ED Course  I have reviewed the triage vital signs and the nursing notes.  Pertinent labs & imaging results that were available during my care of the patient were reviewed by me and considered in my medical decision making (see chart for details).    MDM Rules/Calculators/A&P                         64 year old male presents to the emergency room with  concern for cough, low-grade fever.  Labs notable for leukocytosis.  CXR noted for large left infiltrate consistent with pneumonia.  On exam, patient mildly tachypneic but no hypoxia, no distress, appears comfortable.  I reviewed in detail his lab and x-ray findings.  Due to the degree of pneumonia and leukocytosis, offered admission.   After reviewing risks and benefits of discharge versus admission, patient has decided to go home.  Believe this is a reasonable option given his overall well appearance, reassuring vital signs.  Patient understands need for close recheck with his PCP.  Clearly demonstrated understanding of strict return precautions.  Additionally instructed patient to request repeat CXR by PCP to ensure resolution of his infiltrate and rule out any underlying mass.  Provided initial dose of Rocephin and azithromycin for community-acquired pneumonia.  Given medical comorbidities, will provide Rx for third-generation cephalosporin and azithromycin.  After the discussed management above, the patient was determined to be safe for discharge.  The patient was in agreement with this plan and all questions regarding their care were answered.  ED return precautions were discussed and the patient will return to the ED with any significant worsening of condition.    Final Clinical Impression(s) / ED Diagnoses Final diagnoses:  Community acquired pneumonia of left lower lobe of lung  Leukocytosis, unspecified type    Rx / DC Orders ED Discharge Orders         Ordered    cefpodoxime (VANTIN) 200 MG tablet  2 times daily        10/18/20 1151    azithromycin (ZITHROMAX) 250 MG tablet  Daily        10/18/20 1151           Lucrezia Starch, MD 10/19/20 1527

## 2020-10-25 ENCOUNTER — Encounter: Payer: Self-pay | Admitting: Internal Medicine

## 2020-10-25 ENCOUNTER — Inpatient Hospital Stay (HOSPITAL_COMMUNITY)
Admission: EM | Admit: 2020-10-25 | Discharge: 2020-11-02 | DRG: 163 | Disposition: A | Discharge status: Home Health | Payer: PPO | Attending: Cardiothoracic Surgery | Admitting: Cardiothoracic Surgery

## 2020-10-25 ENCOUNTER — Emergency Department (HOSPITAL_COMMUNITY): Payer: PPO

## 2020-10-25 ENCOUNTER — Ambulatory Visit (INDEPENDENT_AMBULATORY_CARE_PROVIDER_SITE_OTHER): Payer: PPO | Admitting: Internal Medicine

## 2020-10-25 ENCOUNTER — Other Ambulatory Visit: Payer: Self-pay

## 2020-10-25 ENCOUNTER — Ambulatory Visit (INDEPENDENT_AMBULATORY_CARE_PROVIDER_SITE_OTHER): Payer: PPO

## 2020-10-25 VITALS — BP 138/82 | HR 107 | Temp 98.8°F | Resp 20 | Ht 74.0 in | Wt 200.0 lb

## 2020-10-25 DIAGNOSIS — B954 Other streptococcus as the cause of diseases classified elsewhere: Secondary | ICD-10-CM | POA: Diagnosis present

## 2020-10-25 DIAGNOSIS — R918 Other nonspecific abnormal finding of lung field: Secondary | ICD-10-CM | POA: Diagnosis not present

## 2020-10-25 DIAGNOSIS — K219 Gastro-esophageal reflux disease without esophagitis: Secondary | ICD-10-CM | POA: Diagnosis not present

## 2020-10-25 DIAGNOSIS — R0602 Shortness of breath: Secondary | ICD-10-CM | POA: Diagnosis not present

## 2020-10-25 DIAGNOSIS — E663 Overweight: Secondary | ICD-10-CM | POA: Diagnosis present

## 2020-10-25 DIAGNOSIS — R0600 Dyspnea, unspecified: Secondary | ICD-10-CM | POA: Diagnosis not present

## 2020-10-25 DIAGNOSIS — Z8249 Family history of ischemic heart disease and other diseases of the circulatory system: Secondary | ICD-10-CM | POA: Diagnosis not present

## 2020-10-25 DIAGNOSIS — J929 Pleural plaque without asbestos: Secondary | ICD-10-CM | POA: Diagnosis not present

## 2020-10-25 DIAGNOSIS — J869 Pyothorax without fistula: Secondary | ICD-10-CM

## 2020-10-25 DIAGNOSIS — Z791 Long term (current) use of non-steroidal anti-inflammatories (NSAID): Secondary | ICD-10-CM | POA: Diagnosis not present

## 2020-10-25 DIAGNOSIS — E119 Type 2 diabetes mellitus without complications: Secondary | ICD-10-CM | POA: Diagnosis not present

## 2020-10-25 DIAGNOSIS — Z9884 Bariatric surgery status: Secondary | ICD-10-CM

## 2020-10-25 DIAGNOSIS — Z87891 Personal history of nicotine dependence: Secondary | ICD-10-CM | POA: Diagnosis not present

## 2020-10-25 DIAGNOSIS — D72829 Elevated white blood cell count, unspecified: Secondary | ICD-10-CM | POA: Diagnosis present

## 2020-10-25 DIAGNOSIS — Z885 Allergy status to narcotic agent status: Secondary | ICD-10-CM | POA: Diagnosis not present

## 2020-10-25 DIAGNOSIS — Z820 Family history of epilepsy and other diseases of the nervous system: Secondary | ICD-10-CM | POA: Diagnosis not present

## 2020-10-25 DIAGNOSIS — D75839 Thrombocytosis, unspecified: Secondary | ICD-10-CM | POA: Diagnosis not present

## 2020-10-25 DIAGNOSIS — I712 Thoracic aortic aneurysm, without rupture: Secondary | ICD-10-CM | POA: Diagnosis present

## 2020-10-25 DIAGNOSIS — Z79899 Other long term (current) drug therapy: Secondary | ICD-10-CM | POA: Diagnosis not present

## 2020-10-25 DIAGNOSIS — I714 Abdominal aortic aneurysm, without rupture, unspecified: Secondary | ICD-10-CM | POA: Diagnosis present

## 2020-10-25 DIAGNOSIS — J189 Pneumonia, unspecified organism: Secondary | ICD-10-CM | POA: Diagnosis not present

## 2020-10-25 DIAGNOSIS — J939 Pneumothorax, unspecified: Secondary | ICD-10-CM | POA: Diagnosis not present

## 2020-10-25 DIAGNOSIS — Z880 Allergy status to penicillin: Secondary | ICD-10-CM | POA: Diagnosis not present

## 2020-10-25 DIAGNOSIS — Z20822 Contact with and (suspected) exposure to covid-19: Secondary | ICD-10-CM | POA: Diagnosis not present

## 2020-10-25 DIAGNOSIS — J9811 Atelectasis: Secondary | ICD-10-CM | POA: Diagnosis not present

## 2020-10-25 DIAGNOSIS — E039 Hypothyroidism, unspecified: Secondary | ICD-10-CM | POA: Diagnosis present

## 2020-10-25 DIAGNOSIS — I10 Essential (primary) hypertension: Secondary | ICD-10-CM | POA: Diagnosis not present

## 2020-10-25 DIAGNOSIS — E46 Unspecified protein-calorie malnutrition: Secondary | ICD-10-CM | POA: Diagnosis present

## 2020-10-25 DIAGNOSIS — M19041 Primary osteoarthritis, right hand: Secondary | ICD-10-CM | POA: Diagnosis present

## 2020-10-25 DIAGNOSIS — D649 Anemia, unspecified: Secondary | ICD-10-CM | POA: Diagnosis not present

## 2020-10-25 DIAGNOSIS — E785 Hyperlipidemia, unspecified: Secondary | ICD-10-CM | POA: Diagnosis present

## 2020-10-25 DIAGNOSIS — I493 Ventricular premature depolarization: Secondary | ICD-10-CM | POA: Diagnosis present

## 2020-10-25 DIAGNOSIS — Z801 Family history of malignant neoplasm of trachea, bronchus and lung: Secondary | ICD-10-CM

## 2020-10-25 DIAGNOSIS — J440 Chronic obstructive pulmonary disease with acute lower respiratory infection: Secondary | ICD-10-CM | POA: Diagnosis present

## 2020-10-25 DIAGNOSIS — Z9889 Other specified postprocedural states: Secondary | ICD-10-CM

## 2020-10-25 DIAGNOSIS — Z4682 Encounter for fitting and adjustment of non-vascular catheter: Secondary | ICD-10-CM

## 2020-10-25 DIAGNOSIS — M62838 Other muscle spasm: Secondary | ICD-10-CM | POA: Diagnosis not present

## 2020-10-25 DIAGNOSIS — E559 Vitamin D deficiency, unspecified: Secondary | ICD-10-CM | POA: Diagnosis not present

## 2020-10-25 DIAGNOSIS — J449 Chronic obstructive pulmonary disease, unspecified: Secondary | ICD-10-CM | POA: Diagnosis not present

## 2020-10-25 DIAGNOSIS — R059 Cough, unspecified: Secondary | ICD-10-CM | POA: Diagnosis not present

## 2020-10-25 DIAGNOSIS — M19042 Primary osteoarthritis, left hand: Secondary | ICD-10-CM | POA: Diagnosis present

## 2020-10-25 DIAGNOSIS — Z9689 Presence of other specified functional implants: Secondary | ICD-10-CM

## 2020-10-25 DIAGNOSIS — R079 Chest pain, unspecified: Secondary | ICD-10-CM | POA: Diagnosis not present

## 2020-10-25 DIAGNOSIS — M47814 Spondylosis without myelopathy or radiculopathy, thoracic region: Secondary | ICD-10-CM | POA: Diagnosis not present

## 2020-10-25 DIAGNOSIS — Z87820 Personal history of traumatic brain injury: Secondary | ICD-10-CM

## 2020-10-25 DIAGNOSIS — R091 Pleurisy: Secondary | ICD-10-CM | POA: Diagnosis not present

## 2020-10-25 DIAGNOSIS — Z452 Encounter for adjustment and management of vascular access device: Secondary | ICD-10-CM | POA: Diagnosis not present

## 2020-10-25 DIAGNOSIS — R52 Pain, unspecified: Secondary | ICD-10-CM | POA: Diagnosis not present

## 2020-10-25 DIAGNOSIS — J90 Pleural effusion, not elsewhere classified: Secondary | ICD-10-CM | POA: Diagnosis not present

## 2020-10-25 DIAGNOSIS — J439 Emphysema, unspecified: Secondary | ICD-10-CM | POA: Diagnosis not present

## 2020-10-25 LAB — CBC
HCT: 35.2 % — ABNORMAL LOW (ref 39.0–52.0)
Hemoglobin: 11.3 g/dL — ABNORMAL LOW (ref 13.0–17.0)
MCH: 28.5 pg (ref 26.0–34.0)
MCHC: 32.1 g/dL (ref 30.0–36.0)
MCV: 88.7 fL (ref 80.0–100.0)
Platelets: 305 10*3/uL (ref 150–400)
RBC: 3.97 MIL/uL — ABNORMAL LOW (ref 4.22–5.81)
RDW: 13.2 % (ref 11.5–15.5)
WBC: 11.2 10*3/uL — ABNORMAL HIGH (ref 4.0–10.5)
nRBC: 0 % (ref 0.0–0.2)

## 2020-10-25 LAB — TROPONIN I (HIGH SENSITIVITY)
Troponin I (High Sensitivity): 11 ng/L (ref <18)
Troponin I (High Sensitivity): 9 ng/L (ref <18)

## 2020-10-25 LAB — BASIC METABOLIC PANEL
Anion gap: 14 (ref 5–15)
BUN: 13 mg/dL (ref 8–23)
CO2: 25 mmol/L (ref 22–32)
Calcium: 8.8 mg/dL — ABNORMAL LOW (ref 8.9–10.3)
Chloride: 97 mmol/L — ABNORMAL LOW (ref 98–111)
Creatinine, Ser: 0.78 mg/dL (ref 0.61–1.24)
GFR, Estimated: >60 mL/min (ref >60)
Glucose, Bld: 115 mg/dL — ABNORMAL HIGH (ref 70–99)
Potassium: 4.2 mmol/L (ref 3.5–5.1)
Sodium: 136 mmol/L (ref 135–145)

## 2020-10-25 LAB — PROTIME-INR
INR: 1.3 — ABNORMAL HIGH (ref 0.8–1.2)
Prothrombin Time: 15.9 seconds — ABNORMAL HIGH (ref 11.4–15.2)

## 2020-10-25 LAB — COMPREHENSIVE METABOLIC PANEL
ALT: 22 U/L (ref 0–44)
AST: 16 U/L (ref 15–41)
Albumin: 2.1 g/dL — ABNORMAL LOW (ref 3.5–5.0)
Alkaline Phosphatase: 74 U/L (ref 38–126)
Anion gap: 12 (ref 5–15)
BUN: 14 mg/dL (ref 8–23)
CO2: 24 mmol/L (ref 22–32)
Calcium: 8.4 mg/dL — ABNORMAL LOW (ref 8.9–10.3)
Chloride: 97 mmol/L — ABNORMAL LOW (ref 98–111)
Creatinine, Ser: 0.68 mg/dL (ref 0.61–1.24)
GFR, Estimated: >60 mL/min (ref >60)
Glucose, Bld: 157 mg/dL — ABNORMAL HIGH (ref 70–99)
Potassium: 4.3 mmol/L (ref 3.5–5.1)
Sodium: 133 mmol/L — ABNORMAL LOW (ref 135–145)
Total Bilirubin: 0.4 mg/dL (ref 0.3–1.2)
Total Protein: 6.7 g/dL (ref 6.5–8.1)

## 2020-10-25 LAB — RESP PANEL BY RT-PCR (FLU A&B, COVID) ARPGX2
Influenza A by PCR: NEGATIVE
Influenza B by PCR: NEGATIVE
SARS Coronavirus 2 by RT PCR: NEGATIVE

## 2020-10-25 LAB — I-STAT ARTERIAL BLOOD GAS, ED
Acid-Base Excess: 5 mmol/L — ABNORMAL HIGH (ref 0.0–2.0)
Bicarbonate: 28.9 mmol/L — ABNORMAL HIGH (ref 20.0–28.0)
Calcium, Ion: 1.2 mmol/L (ref 1.15–1.40)
HCT: 31 % — ABNORMAL LOW (ref 39.0–52.0)
Hemoglobin: 10.5 g/dL — ABNORMAL LOW (ref 13.0–17.0)
O2 Saturation: 94 %
Patient temperature: 98.4
Potassium: 4.2 mmol/L (ref 3.5–5.1)
Sodium: 136 mmol/L (ref 135–145)
TCO2: 30 mmol/L (ref 22–32)
pCO2 arterial: 40.7 mmHg (ref 32.0–48.0)
pH, Arterial: 7.458 — ABNORMAL HIGH (ref 7.350–7.450)
pO2, Arterial: 68 mmHg — ABNORMAL LOW (ref 83.0–108.0)

## 2020-10-25 LAB — LACTIC ACID, PLASMA: Lactic Acid, Venous: 1.3 mmol/L (ref 0.5–1.9)

## 2020-10-25 LAB — PREPARE RBC (CROSSMATCH)

## 2020-10-25 LAB — APTT: aPTT: 40 seconds — ABNORMAL HIGH (ref 24–36)

## 2020-10-25 LAB — ABO/RH: ABO/RH(D): O POS

## 2020-10-25 LAB — HIV ANTIBODY (ROUTINE TESTING W REFLEX): HIV Screen 4th Generation wRfx: NONREACTIVE

## 2020-10-25 MED ORDER — ONDANSETRON HCL 4 MG/2ML IJ SOLN: 4.0000 mg | Freq: Four times a day (QID) | INTRAMUSCULAR | Status: DC | PRN

## 2020-10-25 MED ORDER — VANCOMYCIN HCL IN DEXTROSE 1-5 GM/200ML-% IV SOLN: 1000.0000 mg | INTRAVENOUS | Status: DC

## 2020-10-25 MED ORDER — ACETAMINOPHEN 650 MG RE SUPP: 650.0000 mg | Freq: Four times a day (QID) | RECTAL | Status: DC | PRN

## 2020-10-25 MED ORDER — VANCOMYCIN HCL 1500 MG/300ML IV SOLN
1500.0000 mg | Freq: Two times a day (BID) | INTRAVENOUS | Status: DC
  Administered 2020-10-26 – 2020-10-29 (×6): 1500 mg via INTRAVENOUS
  Filled 2020-10-25 (×8): qty 300

## 2020-10-25 MED ORDER — ONDANSETRON HCL 4 MG PO TABS: 4.0000 mg | ORAL_TABLET | Freq: Four times a day (QID) | ORAL | Status: DC | PRN

## 2020-10-25 MED ORDER — TRIAZOLAM 0.25 MG PO TABS: 0.2500 mg | ORAL_TABLET | Freq: Every evening | ORAL | Status: DC | PRN

## 2020-10-25 MED ORDER — SODIUM CHLORIDE 0.9 % IV SOLN
2.0000 g | Freq: Once | INTRAVENOUS | Status: AC
  Administered 2020-10-25: 2 g via INTRAVENOUS
  Filled 2020-10-25: qty 2

## 2020-10-25 MED ORDER — ALBUTEROL SULFATE (2.5 MG/3ML) 0.083% IN NEBU: 2.5000 mg | INHALATION_SOLUTION | Freq: Four times a day (QID) | RESPIRATORY_TRACT | Status: DC | PRN

## 2020-10-25 MED ORDER — ENOXAPARIN SODIUM 40 MG/0.4ML ~~LOC~~ SOLN
40.0000 mg | SUBCUTANEOUS | Status: DC
  Administered 2020-10-25: 40 mg via SUBCUTANEOUS
  Filled 2020-10-25: qty 0.4

## 2020-10-25 MED ORDER — VANCOMYCIN HCL 1750 MG/350ML IV SOLN
1750.0000 mg | Freq: Once | INTRAVENOUS | Status: AC
  Administered 2020-10-25: 1750 mg via INTRAVENOUS
  Filled 2020-10-25: qty 350

## 2020-10-25 MED ORDER — IOHEXOL 300 MG/ML  SOLN
75.0000 mL | Freq: Once | INTRAMUSCULAR | Status: AC | PRN
  Administered 2020-10-25: 75 mL via INTRAVENOUS

## 2020-10-25 MED ORDER — OXYCODONE-ACETAMINOPHEN 5-325 MG PO TABS
1.0000 | ORAL_TABLET | Freq: Four times a day (QID) | ORAL | Status: DC | PRN
Start: 2020-10-25 — End: 2020-10-26
  Administered 2020-10-25 – 2020-10-26 (×2): 1 via ORAL
  Filled 2020-10-25 (×2): qty 1

## 2020-10-25 MED ORDER — HYDROMORPHONE HCL 1 MG/ML IJ SOLN
0.5000 mg | INTRAMUSCULAR | Status: DC | PRN
  Administered 2020-10-25: 0.5 mg via INTRAVENOUS
  Filled 2020-10-25: qty 1

## 2020-10-25 MED ORDER — SODIUM CHLORIDE 0.9% FLUSH
3.0000 mL | Freq: Two times a day (BID) | INTRAVENOUS | Status: DC
  Administered 2020-10-25: 3 mL via INTRAVENOUS

## 2020-10-25 MED ORDER — VANCOMYCIN HCL 1500 MG/300ML IV SOLN
1500.0000 mg | INTRAVENOUS | Status: AC
  Administered 2020-10-26 (×2): 1500 mg via INTRAVENOUS
  Filled 2020-10-25: qty 300

## 2020-10-25 MED ORDER — ACETAMINOPHEN 325 MG PO TABS: 650.0000 mg | ORAL_TABLET | Freq: Four times a day (QID) | ORAL | Status: DC | PRN

## 2020-10-25 MED ORDER — HYDROMORPHONE HCL 1 MG/ML IJ SOLN
1.0000 mg | Freq: Once | INTRAMUSCULAR | Status: AC
  Administered 2020-10-25: 1 mg via INTRAVENOUS
  Filled 2020-10-25: qty 1

## 2020-10-25 MED ORDER — METRONIDAZOLE IN NACL 5-0.79 MG/ML-% IV SOLN
500.0000 mg | Freq: Once | INTRAVENOUS | Status: AC
  Administered 2020-10-25: 500 mg via INTRAVENOUS
  Filled 2020-10-25: qty 100

## 2020-10-25 MED ORDER — SODIUM CHLORIDE 0.9 % IV SOLN
2.0000 g | Freq: Three times a day (TID) | INTRAVENOUS | Status: DC
  Administered 2020-10-26 – 2020-10-30 (×12): 2 g via INTRAVENOUS
  Filled 2020-10-25 (×12): qty 2

## 2020-10-25 MED ORDER — METRONIDAZOLE IN NACL 5-0.79 MG/ML-% IV SOLN
500.0000 mg | Freq: Three times a day (TID) | INTRAVENOUS | Status: DC
  Administered 2020-10-26: 500 mg via INTRAVENOUS
  Filled 2020-10-25: qty 100

## 2020-10-25 MED ORDER — PANTOPRAZOLE SODIUM 40 MG PO TBEC: 40.0000 mg | DELAYED_RELEASE_TABLET | Freq: Every day | ORAL | Status: DC

## 2020-10-25 NOTE — H&P (Addendum)
History and Physical    KYLAND NO PTW:[id number] DOB: 1956-08-21 DOA: 10/25/2020  Referring MD/NP/PA: Deno Etienne, MD PCP: Janith Lima, MD  Patient coming from: Primary care office  Chief Complaint: "Pneumonia"  I have personally briefly reviewed patient's old medical records in [place]   HPI: [name] [name] is a 64 y.o. male with medical history significant of hypertension, hyperlipidemia, diet-controlled diabetes mellitus type 2,  S/p gastric band, and GERD presents with with complaints of having pneumonia.  2 to 3 weeks ago patient reported having flulike symptoms.  Seen in the emergency department 1 week ago with complaints of fever, nonproductive cough, shortness of breath, and malaise.  X-rays revealed a large left mid and basilar opacity concerning for pneumonia with associated effusion.  Patient was prescribed cefpodoxime and azithromycin and discharged home.  He was taking antibiotics as prescribed, but reported having continued left mid back and chest pain that felt as though he was being beaten with a baseball bat.  Symptoms worsen to the point which he was unable to sleep at night.  He followed up with his primary care provider today who rechecked a chest x-ray to show that the left side of his lung field was completely whited out.  He was sent to the hospital for further evaluation.  Patient reports that he is only on medicine for arthritis and that he does not require medicine for previous diagnosis of hypertension hyperlipidemia or diabetes.  ED Course: Upon admission into the emergency department patient was seen to be afebrile, pulse 85-107, respirations 14-22, and all other vital signs maintained.  Labs significant for WBC 11.2 and hemoglobin 11.3.  CT scan of the chest revealed a large left-sided empyema with near collapse of the left lung and a 4.2 ascending aortic aneurysm.  Dr. Darcey Nora of cardiothoracic surgery was consulted.  Patient was started on  vancomycin, cefepime, and metronidazole.  Review of Systems  Constitutional: Negative for chills and fever.  HENT: Negative for nosebleeds and sinus pain.   Eyes: Negative for blurred vision and double vision.  Respiratory: Positive for cough. Negative for sputum production.   Cardiovascular: Positive for chest pain. Negative for leg swelling.  Gastrointestinal: Negative for abdominal pain and nausea.  Genitourinary: Negative for dysuria and hematuria.  Musculoskeletal: Positive for back pain.  Neurological: Negative for focal weakness and loss of consciousness.  Psychiatric/Behavioral: Negative for memory loss and substance abuse. The patient has insomnia.     Past Medical History:  Diagnosis Date  . Allergy   . Diabetes mellitus    type 2-off all meds now after wt loss  . GERD (gastroesophageal reflux disease)   . Hyperlipidemia   . Hyperlipidemia   . Hypertension   . Osteoarthritis    hands, mild, not disabling    Past Surgical History:  Procedure Laterality Date  . Arthroscopy X 1 left ([name])     X 2 right  . COLONOSCOPY    . correction of deviated septum    . GANGLION CYST EXCISION     right wrist ([facility])  . GASTRIC BANDING PORT REVISION  8/11  . PILONIDAL CYST / SINUS EXCISION  64 yrs old  . TONSILLECTOMY    . ULNAR COLLATERAL LIGAMENT REPAIR Right 09/21/2013   Procedure: ULNAR COLLATERAL LIGAMENT REPAIR METACARPAL PHALANGE RIGHT THUMB POSSIBLE APL GRAFT RECONSTRUCTION;  Surgeon: Wynonia Sours, MD;  Location: [facility];  Service: Orthopedics;  Laterality: Right;     reports that he quit  smoking about 25 years ago. He has never used smokeless tobacco. He reports that he does not drink alcohol and does not use drugs.  Allergies  Allergen Reactions  . Penicillins Anaphylaxis  . Codeine Nausea And Vomiting    Family History  Problem Relation Age of Onset  . Cancer Father        lung cancer metatstatic cancer to brain  . Coronary artery  disease Father   . Heart attack Father        X 2 (41,45)  . Heart disease Father        CAD/MI x 2  . Cancer Mother        rapid on set  . Hypertension Mother   . Parkinsonism Maternal Grandfather   . Diabetes Neg Hx   . Colon cancer Neg Hx   . Colon polyps Neg Hx   . Esophageal cancer Neg Hx   . Rectal cancer Neg Hx   . Stomach cancer Neg Hx     Prior to Admission medications   Medication Sig Start Date End Date Taking? Authorizing Provider  azithromycin (ZITHROMAX) 250 MG tablet Take 1 tablet (250 mg total) by mouth daily. Take first 2 tablets together, then 1 every day until finished. 10/18/20   Lucrezia Starch, MD  cefpodoxime (VANTIN) 200 MG tablet Take 1 tablet (200 mg total) by mouth 2 (two) times daily for 7 days. 10/18/20 10/25/20  Lucrezia Starch, MD  [facility] ZINC GLUCONATE 50 MG tablet TAKE 1 TABLET (50 MG TOTAL) BY MOUTH EVERY OTHER DAY. 08/19/20   Janith Lima, MD  meloxicam (MOBIC) 15 MG tablet TAKE 1 TABLET (15 MG TOTAL) BY MOUTH DAILY. **OFFICE VISIT IS DUE** 08/19/20   Janith Lima, MD  Multiple Vitamin (MULTIVITAMIN) tablet Take 1 tablet by mouth daily.    [provider]  pantoprazole (PROTONIX) 40 MG tablet TAKE 1 TABLET BY MOUTH EVERY DAY Patient not taking: Reported on 10/25/2020 08/19/20   Janith Lima, MD  tadalafil (CIALIS) 20 MG tablet TAKE 1 TABLET BY MOUTH EVERY DAY AS NEEDED FOR ERECTILE DYSFUNCTION 12/04/19   Janith Lima, MD  triazolam (HALCION) 0.25 MG tablet TAKE 1 TABLET (0.25 MG TOTAL) BY MOUTH AT BEDTIME AS NEEDED. 08/20/20   Janith Lima, MD  Turmeric 450 MG CAPS Take 1 capsule by mouth 2 (two) times daily with a meal. 02/13/15   Janith Lima, MD  Cholecalciferol (VITAMIN D3) 1.25 MG (50000 UT) CAPS TAKE 1 CAPSULE BY MOUTH ONE TIME PER WEEK 10/30/19   Janith Lima, MD  meloxicam (MOBIC) 15 MG tablet TAKE 1 TABLET (15 MG TOTAL) BY MOUTH DAILY. **OFFICE VISIT IS DUE** 03/08/20   Janith Lima, MD  pantoprazole (PROTONIX)  40 MG tablet TAKE 1 TABLET BY MOUTH EVERY DAY 11/13/19   Janith Lima, MD  tadalafil (CIALIS) 20 MG tablet Take 1 tablet (20 mg total) by mouth daily as needed for erectile dysfunction. 12/09/18   Janith Lima, MD    Physical Exam:  Constitutional: Older male who appears to be in no acute distress at this time Vitals:   10/25/20 1515 10/25/20 1530 10/25/20 1545 10/25/20 1633  BP: 135/87 131/86 120/87 (!) 133/99  Pulse: 91 86 85 85  Resp: 14 16 15  (!) 22  Temp:      TempSrc:      SpO2: 94% 94% 95% 94%   Eyes: PERRL, lids and conjunctivae normal ENMT: Mucous membranes are moist. Posterior  pharynx clear of any exudate or lesions.  Neck: normal, supple, no masses, no thyromegaly Respiratory: Decreased aeration most notably on the left lower lung field.  No significant wheezes or rhonchi appreciated.  Currently O2 saturation maintained on room air.  Patient able to talk in complete sentences. Cardiovascular: Regular rate and rhythm, no murmurs / rubs / gallops. No extremity edema. 2+ pedal pulses. No carotid bruits.  Abdomen: no tenderness, no masses palpated. No hepatosplenomegaly. Bowel sounds positive.  Musculoskeletal: no clubbing / cyanosis. No joint deformity upper and lower extremities. Good ROM, no contractures. Normal muscle tone.  Skin: no rashes, lesions, ulcers. No induration Neurologic: CN 2-12 grossly intact. Sensation intact, DTR normal. Strength 5/5 in all 4.  Psychiatric: Normal judgment and insight. Alert and oriented x 3. Normal mood.     Labs on Admission: I have personally reviewed following labs and imaging studies  CBC: Recent Labs  Lab 10/25/20 1008  WBC 11.2*  HGB 11.3*  HCT 35.2*  MCV 88.7  PLT 294   Basic Metabolic Panel: Recent Labs  Lab 10/25/20 1008  NA 136  K 4.2  CL 97*  CO2 25  GLUCOSE 115*  BUN 13  CREATININE 0.78  CALCIUM 8.8*   GFR: Estimated Creatinine Clearance: 107 mL/min (by C-G formula based on SCr of 0.78 mg/dL). Liver  Function Tests: No results for input(s): AST, ALT, ALKPHOS, BILITOT, PROT, ALBUMIN in the last 168 hours. No results for input(s): LIPASE, AMYLASE in the last 168 hours. No results for input(s): AMMONIA in the last 168 hours. Coagulation Profile: No results for input(s): INR, PROTIME in the last 168 hours. Cardiac Enzymes: No results for input(s): CKTOTAL, CKMB, CKMBINDEX, TROPONINI in the last 168 hours. BNP (last 3 results) No results for input(s): PROBNP in the last 8760 hours. HbA1C: No results for input(s): HGBA1C in the last 72 hours. CBG: No results for input(s): GLUCAP in the last 168 hours. Lipid Profile: No results for input(s): CHOL, HDL, LDLCALC, TRIG, CHOLHDL, LDLDIRECT in the last 72 hours. Thyroid Function Tests: No results for input(s): TSH, T4TOTAL, FREET4, T3FREE, THYROIDAB in the last 72 hours. Anemia Panel: No results for input(s): VITAMINB12, FOLATE, FERRITIN, TIBC, IRON, RETICCTPCT in the last 72 hours. Urine analysis:    Component Value Date/Time   COLORURINE YELLOW 02/24/2020 0810   APPEARANCEUR TURBID (A) 02/24/2020 0810   LABSPEC >=1.030 (A) 02/24/2020 0810   PHURINE 6.0 02/24/2020 0810   GLUCOSEU NEGATIVE 02/24/2020 0810   HGBUR NEGATIVE 02/24/2020 0810   BILIRUBINUR SMALL (A) 02/24/2020 0810   KETONESUR NEGATIVE 02/24/2020 0810   UROBILINOGEN 0.2 02/24/2020 0810   NITRITE NEGATIVE 02/24/2020 0810   LEUKOCYTESUR NEGATIVE 02/24/2020 0810   Sepsis Labs: Recent Results (from the past 240 hour(s))  Resp Panel by RT-PCR (Flu A&B, Covid) Nasopharyngeal Swab     Status: None   Collection Time: 10/18/20  8:40 AM   Specimen: Nasopharyngeal Swab; Nasopharyngeal(NP) swabs in vial transport medium  Result Value Ref Range Status   SARS Coronavirus 2 by RT PCR NEGATIVE NEGATIVE Final    Comment: (NOTE) SARS-CoV-2 target nucleic acids are NOT DETECTED.  The SARS-CoV-2 RNA is generally detectable in upper respiratory specimens during the acute phase of  infection. The lowest concentration of SARS-CoV-2 viral copies this assay can detect is 138 copies/mL. A negative result does not preclude SARS-Cov-2 infection and should not be used as the sole basis for treatment or other patient management decisions. A negative result may occur with  improper specimen collection/handling,  submission of specimen other than nasopharyngeal swab, presence of viral mutation(s) within the areas targeted by this assay, and inadequate number of viral copies(<138 copies/mL). A negative result must be combined with clinical observations, patient history, and epidemiological information. The expected result is Negative.  Fact Sheet for Patients:  EntrepreneurPulse.com.au  Fact Sheet for Healthcare Providers:  IncredibleEmployment.be  This test is no t yet approved or cleared by the [place] FDA and  has been authorized for detection and/or diagnosis of SARS-CoV-2 by FDA under an Emergency Use Authorization (EUA). This EUA will remain  in effect (meaning this test can be used) for the duration of the COVID-19 declaration under Section 564(b)(1) of the Act, 21 U.S.C.section 360bbb-3(b)(1), unless the authorization is terminated  or revoked sooner.       Influenza A by PCR NEGATIVE NEGATIVE Final   Influenza B by PCR NEGATIVE NEGATIVE Final    Comment: (NOTE) The Xpert Xpress SARS-CoV-2/FLU/RSV plus assay is intended as an aid in the diagnosis of influenza from Nasopharyngeal swab specimens and should not be used as a sole basis for treatment. Nasal washings and aspirates are unacceptable for Xpert Xpress SARS-CoV-2/FLU/RSV testing.  Fact Sheet for Patients: EntrepreneurPulse.com.au  Fact Sheet for Healthcare Providers: IncredibleEmployment.be  This test is not yet approved or cleared by the [place] FDA and has been authorized for detection and/or diagnosis of SARS-CoV-2  by FDA under an Emergency Use Authorization (EUA). This EUA will remain in effect (meaning this test can be used) for the duration of the COVID-19 declaration under Section 564(b)(1) of the Act, 21 U.S.C. section 360bbb-3(b)(1), unless the authorization is terminated or revoked.  Performed at [facility], [address] [place], [place] [address]   Resp Panel by RT-PCR (Flu A&B, Covid) Nasopharyngeal Swab     Status: None   Collection Time: 10/25/20  1:12 PM   Specimen: Nasopharyngeal Swab; Nasopharyngeal(NP) swabs in vial transport medium  Result Value Ref Range Status   SARS Coronavirus 2 by RT PCR NEGATIVE NEGATIVE Final    Comment: (NOTE) SARS-CoV-2 target nucleic acids are NOT DETECTED.  The SARS-CoV-2 RNA is generally detectable in upper respiratory specimens during the acute phase of infection. The lowest concentration of SARS-CoV-2 viral copies this assay can detect is 138 copies/mL. A negative result does not preclude SARS-Cov-2 infection and should not be used as the sole basis for treatment or other patient management decisions. A negative result may occur with  improper specimen collection/handling, submission of specimen other than nasopharyngeal swab, presence of viral mutation(s) within the areas targeted by this assay, and inadequate number of viral copies(<138 copies/mL). A negative result must be combined with clinical observations, patient history, and epidemiological information. The expected result is Negative.  Fact Sheet for Patients:  EntrepreneurPulse.com.au  Fact Sheet for Healthcare Providers:  IncredibleEmployment.be  This test is no t yet approved or cleared by the [place] FDA and  has been authorized for detection and/or diagnosis of SARS-CoV-2 by FDA under an Emergency Use Authorization (EUA). This EUA will remain  in effect (meaning this test can be used) for the duration of the COVID-19  declaration under Section 564(b)(1) of the Act, 21 U.S.C.section 360bbb-3(b)(1), unless the authorization is terminated  or revoked sooner.       Influenza A by PCR NEGATIVE NEGATIVE Final   Influenza B by PCR NEGATIVE NEGATIVE Final    Comment: (NOTE) The Xpert Xpress SARS-CoV-2/FLU/RSV plus assay is intended as an aid in the diagnosis  of influenza from Nasopharyngeal swab specimens and should not be used as a sole basis for treatment. Nasal washings and aspirates are unacceptable for Xpert Xpress SARS-CoV-2/FLU/RSV testing.  Fact Sheet for Patients: EntrepreneurPulse.com.au  Fact Sheet for Healthcare Providers: IncredibleEmployment.be  This test is not yet approved or cleared by the [place] FDA and has been authorized for detection and/or diagnosis of SARS-CoV-2 by FDA under an Emergency Use Authorization (EUA). This EUA will remain in effect (meaning this test can be used) for the duration of the COVID-19 declaration under Section 564(b)(1) of the Act, 21 U.S.C. section 360bbb-3(b)(1), unless the authorization is terminated or revoked.  Performed at [facility], [place] [address], [place], [place] [address]      Radiological Exams on Admission: DG Chest 2 View  Result Date: 10/25/2020 CLINICAL DATA:  Chest pain, dyspnea.  Pneumonia. EXAM: CHEST - 2 VIEW COMPARISON:  October 18, 2020. FINDINGS: Interval development of air-fluid level is noted posteriorly in the left upper lobe consistent with moderate to large left hydropneumothorax. There is otherwise nearly complete opacification of the left hemithorax consistent with pneumonia. Right lung is clear. Bony thorax is unremarkable. IMPRESSION: Moderate to large left hydropneumothorax is noted with otherwise nearly complete opacification of left hemithorax consistent with pneumonia. CT scan is recommended for further evaluation. Electronically Signed   By: Marijo Conception M.D.   On:  10/25/2020 09:16   CT Chest W Contrast  Result Date: 10/25/2020 CLINICAL DATA:  Continued cough and low-grade fever since finishing antibiotics for pneumonia. EXAM: CT CHEST WITH CONTRAST TECHNIQUE: Multidetector CT imaging of the chest was performed during intravenous contrast administration. CONTRAST:  14mL OMNIPAQUE IOHEXOL 300 MG/ML  SOLN COMPARISON:  Chest x-ray from same day. CT chest dated September 25, 2011. FINDINGS: Cardiovascular: Normal heart size. No pericardial effusion. Aneurysmal dilatation of the ascending thoracic aorta measuring up to 4.2 cm. No dissection. Coronary, aortic arch, and branch vessel atherosclerotic vascular disease. No pulmonary embolism. Mediastinum/Nodes: Slight rightward mediastinal shift. No enlarged mediastinal, hilar, or axillary lymph nodes. Patulous esophagus. Thyroid gland and trachea demonstrate no significant findings. Lungs/Pleura: Large loculated gas and fluid collection in the left pleural space with pleural thickening and enhancement. The collection essentially fills the entire left hemithorax with severe mass effect on the left lung which is mostly collapsed. The right lung is clear. Upper Abdomen: No acute abnormality. Prior gastric lap band surgery. Musculoskeletal: No chest wall abnormality. No acute or significant osseous findings. Old left-sided rib fractures. IMPRESSION: 1. Large left-sided empyema essentially filling the entire left hemithorax with near complete collapse of the left lung. 2. 4.2 cm ascending aortic aneurysm. Recommend annual imaging followup by CTA or MRA. This recommendation follows 2010 ACCF/AHA/AATS/ACR/ASA/SCA/SCAI/SIR/STS/SVM Guidelines for the Diagnosis and Management of Patients with Thoracic Aortic Disease. Circulation. 2010; 121: F621-H086. Aortic aneurysm NOS (ICD10-I71.9) 3. Aortic Atherosclerosis (ICD10-I70.0). Electronically Signed   By: Titus Dubin M.D.   On: 10/25/2020 15:49    EKG: Independently reviewed.  Sinus  tachycardia 105 bpm Assessment/Plan Left chest empyema: Acute.  Patient presented for persistent nonproductive cough.  Found to have complete opacification of the left lung field suspected to be empyema on CT.  Dr. Darcey Nora of cardiothoracic surgery consulted with plans for VATS in a.m.  -Admit to a medical telemetry bed -Follow-up blood cultures -N.p.o. after midnight -Continue empiric antibiotics of vancomycin, cefepime, and metronidazole -Appreciate cardiothoracic surgery,  will follow-up for further recommendations  Leukocytosis/SIRS: Acute.  Patient was noted to be tachycardic and  tachypneic meeting SIRS criteria.  White blood cell count was elevated at 11.2.  Lactic acid was not initially obtained.  -Add on lactic acid to previous lab -Recheck CBC in  AAA: Acute.  Patient with 4.2 cm ascending aortic aneurysm noted on CT a of the chest. -Patient will need annual monitoring of ascending aorta aneurysm  Normocytic anemia: Hemoglobin 11.3 g/dL.  Patient did not report any complaints of bleeding. -Continue to monitor H&H  Status post lap-band History of diabetes and hypertension:  He has not been on any medications for blood pressure or diabetes since gastric lap band and weight loss    DVT prophylaxis: Lovenox Code Status: Full Family Communication: Wife updated over the phone Disposition Plan: Likely discharge home once medically stable Consults called: Cardiothoracic surgery Admission status: Inpatient status requiring more than 2 midnight stay for treatment of empyema  Norval Morton MD [facility]   If 7PM-7AM, please contact night-coverage   10/25/2020, 4:53 PM

## 2020-10-25 NOTE — ED Provider Notes (Signed)
64 yo M with a chief complaint of difficulty breathing and some left-sided discomfort.  Was seen in the ED about a week ago and had almost complete left-sided effusion.  Was recommended to come to the hospital but declined.  Saw an outpatient doctor today and had a complete white out.  Sent here for CT.  CT concerning for a almost complete left-sided empyema.  I discussed this case with Dr. Darcey Nora.  He will see the patient and likely perform a VATS tomorrow.  Recommended medical admission and broad-spectrum antibiotics.  Admit.  CRITICAL CARE Performed by: Cecilio Asper   Total critical care time: 35 minutes  Critical care time was exclusive of separately billable procedures and treating other patients.  Critical care was necessary to treat or prevent imminent or life-threatening deterioration.  Critical care was time spent personally by me on the following activities: development of treatment plan with patient and/or surrogate as well as nursing, discussions with consultants, evaluation of patient's response to treatment, examination of patient, obtaining history from patient or surrogate, ordering and performing treatments and interventions, ordering and review of laboratory studies, ordering and review of radiographic studies, pulse oximetry and re-evaluation of patient's condition.    Deno Etienne, DO 10/25/20 2013

## 2020-10-25 NOTE — ED Provider Notes (Signed)
[facility] EMERGENCY DEPARTMENT Provider Note   CSN: [id number] Arrival date & time: 10/25/20  0957     History Chief Complaint  Patient presents with  . Pneumonia    [name] [name] is a 64 y.o. male.  HPI 64 year old male presents with pneumonia/abnormal chest x-ray.  Ongoing symptoms for about 2-1/2 or 3 weeks.  Much worse recently.  1 week ago he went to [facility] where he was diagnosed with pneumonia and put on antibiotics.  Is not improving since then.  Is having a lot of sharp pain in his left chest and back.  Has cough but cannot get anything up.  He is also feeling much more dyspneic than he has been, especially with exertion like going up stairs.  Saw his doctor and had a repeat chest x-ray today that was worse and sent to the ER for evaluation.  Past Medical History:  Diagnosis Date  . Allergy   . Diabetes mellitus    type 2-off all meds now after wt loss  . GERD (gastroesophageal reflux disease)   . Hyperlipidemia   . Hyperlipidemia   . Hypertension   . Osteoarthritis    hands, mild, not disabling    Patient Active Problem List   Diagnosis Date Noted  . Pneumonia of left lower lobe due to infectious organism 10/25/2020  . Zinc deficiency 03/01/2020  . Elevated LFTs 02/24/2020  . Iron deficiency anemia due to dietary causes 02/24/2020  . Primary osteoarthritis of both hands 03/09/2019  . Cannabis abuse 12/13/2018  . COPD (chronic obstructive pulmonary disease) with acute bronchitis ([place]) 12/09/2018  . LAP-BAND surgery status 02/13/2015  . Vitamin D deficiency 05/10/2014  . Thiamine deficiency 05/10/2014  . Routine general medical examination at a health care facility 05/05/2014  . TBI (traumatic brain injury) ([place]) 03/20/2014  . Primary insomnia 07/04/2010  . Hypothyroidism 04/05/2009  . DJD (degenerative joint disease) of knee 05/30/2008  . Hyperglycemia 03/26/2007  . Hyperlipidemia with target LDL less than 100 03/26/2007   . OBESITY 03/26/2007  . Essential hypertension 03/26/2007  . GERD 03/26/2007    Past Surgical History:  Procedure Laterality Date  . Arthroscopy X 1 left ([name])     X 2 right  . COLONOSCOPY    . correction of deviated septum    . GANGLION CYST EXCISION     right wrist ([place])  . GASTRIC BANDING PORT REVISION  8/11  . PILONIDAL CYST / SINUS EXCISION  64 yrs old  . TONSILLECTOMY    . ULNAR COLLATERAL LIGAMENT REPAIR Right 09/21/2013   Procedure: ULNAR COLLATERAL LIGAMENT REPAIR METACARPAL PHALANGE RIGHT THUMB POSSIBLE APL GRAFT RECONSTRUCTION;  Surgeon: Wynonia Sours, MD;  Location: [facility];  Service: Orthopedics;  Laterality: Right;       Family History  Problem Relation Age of Onset  . Cancer Father        lung cancer metatstatic cancer to brain  . Coronary artery disease Father   . Heart attack Father        X 2 (41,45)  . Heart disease Father        CAD/MI x 2  . Cancer Mother        rapid on set  . Hypertension Mother   . Parkinsonism Maternal Grandfather   . Diabetes Neg Hx   . Colon cancer Neg Hx   . Colon polyps Neg Hx   . Esophageal cancer Neg Hx   . Rectal cancer  Neg Hx   . Stomach cancer Neg Hx     Social History   Tobacco Use  . Smoking status: Former Smoker    Quit date: 06/13/1995    Years since quitting: 25.3  . Smokeless tobacco: Never Used  Substance Use Topics  . Alcohol use: No    Alcohol/week: 0.0 standard drinks  . Drug use: No    Home Medications Prior to Admission medications   Medication Sig Start Date End Date Taking? Authorizing Provider  azithromycin (ZITHROMAX) 250 MG tablet Take 1 tablet (250 mg total) by mouth daily. Take first 2 tablets together, then 1 every day until finished. 10/18/20   Lucrezia Starch, MD  cefpodoxime (VANTIN) 200 MG tablet Take 1 tablet (200 mg total) by mouth 2 (two) times daily for 7 days. 10/18/20 10/25/20  Lucrezia Starch, MD  [facility] ZINC GLUCONATE 50 MG tablet TAKE 1 TABLET (50 MG  TOTAL) BY MOUTH EVERY OTHER DAY. 08/19/20   Janith Lima, MD  meloxicam (MOBIC) 15 MG tablet TAKE 1 TABLET (15 MG TOTAL) BY MOUTH DAILY. **OFFICE VISIT IS DUE** 08/19/20   Janith Lima, MD  Multiple Vitamin (MULTIVITAMIN) tablet Take 1 tablet by mouth daily.    [provider]  pantoprazole (PROTONIX) 40 MG tablet TAKE 1 TABLET BY MOUTH EVERY DAY Patient not taking: Reported on 10/25/2020 08/19/20   Janith Lima, MD  tadalafil (CIALIS) 20 MG tablet TAKE 1 TABLET BY MOUTH EVERY DAY AS NEEDED FOR ERECTILE DYSFUNCTION 12/04/19   Janith Lima, MD  triazolam (HALCION) 0.25 MG tablet TAKE 1 TABLET (0.25 MG TOTAL) BY MOUTH AT BEDTIME AS NEEDED. 08/20/20   Janith Lima, MD  Turmeric 450 MG CAPS Take 1 capsule by mouth 2 (two) times daily with a meal. 02/13/15   Janith Lima, MD  Cholecalciferol (VITAMIN D3) 1.25 MG (50000 UT) CAPS TAKE 1 CAPSULE BY MOUTH ONE TIME PER WEEK 10/30/19   Janith Lima, MD  meloxicam (MOBIC) 15 MG tablet TAKE 1 TABLET (15 MG TOTAL) BY MOUTH DAILY. **OFFICE VISIT IS DUE** 03/08/20   Janith Lima, MD  pantoprazole (PROTONIX) 40 MG tablet TAKE 1 TABLET BY MOUTH EVERY DAY 11/13/19   Janith Lima, MD  tadalafil (CIALIS) 20 MG tablet Take 1 tablet (20 mg total) by mouth daily as needed for erectile dysfunction. 12/09/18   Janith Lima, MD    Allergies    Penicillins and Codeine  Review of Systems   Review of Systems  Constitutional: Negative for fever.  Respiratory: Positive for cough and shortness of breath.   Cardiovascular: Positive for chest pain.  All other systems reviewed and are negative.   Physical Exam Updated Vital Signs BP 140/84 (BP Location: Right Arm)   Pulse 93   Temp 98.2 F (36.8 C) (Oral)   Resp 18   SpO2 99%   Physical Exam Vitals and nursing note reviewed.  Constitutional:      General: He is in acute distress (in pain).     Appearance: He is well-developed and well-nourished. He is not ill-appearing or  diaphoretic.  HENT:     Head: Normocephalic and atraumatic.     Right Ear: External ear normal.     Left Ear: External ear normal.     Nose: Nose normal.  Eyes:     General:        Right eye: No discharge.        Left eye: No discharge.  Cardiovascular:     Rate and Rhythm: Normal rate and regular rhythm.     Heart sounds: Normal heart sounds.  Pulmonary:     Effort: Pulmonary effort is normal. Tachypnea present. No accessory muscle usage.     Breath sounds: Examination of the left-upper field reveals decreased breath sounds. Examination of the left-middle field reveals decreased breath sounds. Examination of the left-lower field reveals decreased breath sounds. Decreased breath sounds present.  Chest:     Chest wall: No tenderness.  Abdominal:     Palpations: Abdomen is soft.     Tenderness: There is no abdominal tenderness.  Musculoskeletal:        General: No edema.     Cervical back: Neck supple.  Skin:    General: Skin is warm and dry.  Neurological:     Mental Status: He is alert.  Psychiatric:        Mood and Affect: Mood is not anxious.     ED Results / Procedures / Treatments   Labs (all labs ordered are listed, but only abnormal results are displayed) Labs Reviewed  BASIC METABOLIC PANEL - Abnormal; Notable for the following components:      Result Value   Chloride 97 (*)    Glucose, Bld 115 (*)    Calcium 8.8 (*)    All other components within normal limits  CBC - Abnormal; Notable for the following components:   WBC 11.2 (*)    RBC 3.97 (*)    Hemoglobin 11.3 (*)    HCT 35.2 (*)    All other components within normal limits  CULTURE, BLOOD (ROUTINE X 2)  CULTURE, BLOOD (ROUTINE X 2)  TROPONIN I (HIGH SENSITIVITY)  TROPONIN I (HIGH SENSITIVITY)    EKG EKG Interpretation  Date/Time:  Thursday October 25 2020 09:59:20 EST Ventricular Rate:  105 PR Interval:  112 QRS Duration: 88 QT Interval:  324 QTC Calculation: 428 R Axis:   22 Text  Interpretation: Sinus tachycardia Nonspecific T wave abnormality Abnormal ECG rate is faster, otherwise similar to Oct 18 2020 Confirmed by Sherwood Gambler [phone]) on 10/25/2020 12:59:55 PM   Radiology DG Chest 2 View  Result Date: 10/25/2020 CLINICAL DATA:  Chest pain, dyspnea.  Pneumonia. EXAM: CHEST - 2 VIEW COMPARISON:  October 18, 2020. FINDINGS: Interval development of air-fluid level is noted posteriorly in the left upper lobe consistent with moderate to large left hydropneumothorax. There is otherwise nearly complete opacification of the left hemithorax consistent with pneumonia. Right lung is clear. Bony thorax is unremarkable. IMPRESSION: Moderate to large left hydropneumothorax is noted with otherwise nearly complete opacification of left hemithorax consistent with pneumonia. CT scan is recommended for further evaluation. Electronically Signed   By: Marijo Conception M.D.   On: 10/25/2020 09:16    Procedures Procedures (including critical care time)  Medications Ordered in ED Medications  HYDROmorphone (DILAUDID) injection 1 mg (has no administration in time range)    ED Course  I have reviewed the triage vital signs and the nursing notes.  Pertinent labs & imaging results that were available during my care of the patient were reviewed by me and considered in my medical decision making (see chart for details).    MDM Rules/Calculators/A&P                          Patient is in pain but not ill-appearing. Afebrile. Will hold on antibiotics until we get CT back to assess  what is going on in his chest. Care to Dr. Tyrone Nine Final Clinical Impression(s) / ED Diagnoses Final diagnoses:  None    Rx / DC Orders ED Discharge Orders    None       Sherwood Gambler, MD 10/25/20 1555

## 2020-10-25 NOTE — Progress Notes (Signed)
Subjective:  Patient ID: [name] [name], male    DOB: [birth year]  Age: 64 y.o. MRN: [id number]  CC: Cough  This visit occurred during the SARS-CoV-2 public health emergency.  Safety protocols were in place, including screening questions prior to the visit, additional usage of staff PPE, and extensive cleaning of exam room while observing appropriate contact time as indicated for disinfecting solutions.    HPI [name] [name] presents for f/up -he was seen at an ED about a week ago and diagnosed with left lower lobe pneumonia.  He has felt poorly for 3 weeks with weight loss, back pain, night sweats, cough productive of phlegm, and shortness of breath.  He has not experienced fever or chills.  Despite a course of azithromycin and Cefpodoxime his symptoms are worsening.  Outpatient Medications Prior to Visit  Medication Sig Dispense Refill  . azithromycin (ZITHROMAX) 250 MG tablet Take 1 tablet (250 mg total) by mouth daily. Take first 2 tablets together, then 1 every day until finished. 6 tablet 0  . cefpodoxime (VANTIN) 200 MG tablet Take 1 tablet (200 mg total) by mouth 2 (two) times daily for 7 days. 14 tablet 0  . [facility] ZINC GLUCONATE 50 MG tablet TAKE 1 TABLET (50 MG TOTAL) BY MOUTH EVERY OTHER DAY. 45 tablet 1  . meloxicam (MOBIC) 15 MG tablet TAKE 1 TABLET (15 MG TOTAL) BY MOUTH DAILY. **OFFICE VISIT IS DUE** 90 tablet 0  . Multiple Vitamin (MULTIVITAMIN) tablet Take 1 tablet by mouth daily.    . tadalafil (CIALIS) 20 MG tablet TAKE 1 TABLET BY MOUTH EVERY DAY AS NEEDED FOR ERECTILE DYSFUNCTION 20 tablet 5  . triazolam (HALCION) 0.25 MG tablet TAKE 1 TABLET (0.25 MG TOTAL) BY MOUTH AT BEDTIME AS NEEDED. 90 tablet 1  . Turmeric 450 MG CAPS Take 1 capsule by mouth 2 (two) times daily with a meal. 180 capsule 3  . pantoprazole (PROTONIX) 40 MG tablet TAKE 1 TABLET BY MOUTH EVERY DAY (Patient not taking: Reported on 10/25/2020) 90 tablet 1  . Omega-3 Fatty Acids (OMEGA-3 FISH OIL PO) Take 2  tablets by mouth daily.    Marland Kitchen thiamine (VITAMIN B-1) 100 MG tablet Take 1 tablet (100 mg total) by mouth daily. 100 tablet 1   No facility-administered medications prior to visit.    ROS Review of Systems  Constitutional: Positive for activity change, appetite change, diaphoresis, fatigue and unexpected weight change. Negative for chills and fever.  Eyes: Negative.   Respiratory: Positive for cough and shortness of breath. Negative for wheezing.   Cardiovascular: Negative for chest pain, palpitations and leg swelling.  Gastrointestinal: Negative.  Negative for abdominal pain.  Genitourinary: Negative.   Musculoskeletal: Positive for back pain.  Skin: Negative.   Neurological: Negative.  Negative for dizziness, weakness and light-headedness.  Hematological: Negative for adenopathy. Does not bruise/bleed easily.  Psychiatric/Behavioral: Negative.     Objective:  BP 138/82   Pulse (!) 107   Temp 98.8 F (37.1 C) (Oral)   Resp 20   Ht 6\' 2"  (1.88 m)   Wt 200 lb (90.7 kg)   SpO2 97%   BMI 25.68 kg/m   BP Readings from Last 3 Encounters:  10/25/20 138/82  10/18/20 129/78  03/28/20 117/76    Wt Readings from Last 3 Encounters:  10/25/20 200 lb (90.7 kg)  10/18/20 188 lb (85.3 kg)  02/23/20 224 lb (101.6 kg)    Physical Exam Vitals reviewed.  Constitutional:      Appearance:  He is ill-appearing (appears ill, in pain and SOB).  HENT:     Nose: Nose normal.     Mouth/Throat:     Mouth: Mucous membranes are moist.  Eyes:     General: No scleral icterus.    Conjunctiva/sclera: Conjunctivae normal.  Cardiovascular:     Rate and Rhythm: Normal rate and regular rhythm.     Pulses: Normal pulses.     Heart sounds: No murmur heard.   Pulmonary:     Effort: Tachypnea and accessory muscle usage present.     Breath sounds: Examination of the left-middle field reveals decreased breath sounds. Examination of the left-lower field reveals decreased breath sounds. Decreased  breath sounds present. No wheezing, rhonchi or rales.  Abdominal:     General: Abdomen is flat. There is no distension.     Tenderness: There is no guarding.  Musculoskeletal:        General: Normal range of motion.     Cervical back: Neck supple.     Right lower leg: No edema.     Left lower leg: No edema.  Skin:    General: Skin is warm and dry.  Neurological:     General: No focal deficit present.     Mental Status: He is alert.  Psychiatric:        Mood and Affect: Mood normal.     Lab Results  Component Value Date   WBC 21.8 (H) 10/18/2020   HGB 12.1 (L) 10/18/2020   HCT 36.7 (L) 10/18/2020   PLT 290 10/18/2020   GLUCOSE 147 (H) 10/18/2020   CHOL 168 02/24/2020   TRIG 135.0 02/24/2020   HDL 48.70 02/24/2020   LDLDIRECT 118.0 05/14/2018   LDLCALC 92 02/24/2020   ALT 37 10/18/2020   AST 35 10/18/2020   NA 140 10/18/2020   K 3.6 10/18/2020   CL 103 10/18/2020   CREATININE 0.81 10/18/2020   BUN 18 10/18/2020   CO2 26 10/18/2020   TSH 2.62 02/24/2020   PSA 0.39 02/24/2020   INR 1.2 (H) 03/01/2020   HGBA1C 5.7 02/24/2020   MICROALBUR 4.2 (H) 12/30/2007    DG Chest Portable 1 View  Result Date: 10/18/2020 CLINICAL DATA:  Cough, fever. EXAM: PORTABLE CHEST 1 VIEW COMPARISON:  September 27, 2011. FINDINGS: No pneumothorax is noted. Right lung is clear. Large left midlung and basilar opacity is noted concerning for pneumonia or atelectasis with associated pleural effusion. Underlying mass cannot be excluded. Old left rib fractures are noted. IMPRESSION: Large left midlung and basilar opacity is noted concerning for pneumonia or atelectasis with associated pleural effusion. Underlying mass cannot be excluded. Followup PA and lateral chest X-ray is recommended in 3-4 weeks following trial of antibiotic therapy to ensure resolution and exclude underlying malignancy. Electronically Signed   By: Marijo Conception M.D.   On: 10/18/2020 08:37   DG Chest 2 View  Result Date:  10/25/2020 CLINICAL DATA:  Chest pain, dyspnea.  Pneumonia. EXAM: CHEST - 2 VIEW COMPARISON:  October 18, 2020. FINDINGS: Interval development of air-fluid level is noted posteriorly in the left upper lobe consistent with moderate to large left hydropneumothorax. There is otherwise nearly complete opacification of the left hemithorax consistent with pneumonia. Right lung is clear. Bony thorax is unremarkable. IMPRESSION: Moderate to large left hydropneumothorax is noted with otherwise nearly complete opacification of left hemithorax consistent with pneumonia. CT scan is recommended for further evaluation. Electronically Signed   By: Marijo Conception M.D.   On:  10/25/2020 09:16    Assessment & Plan:   Va was seen today for cough.  Diagnoses and all orders for this visit:  Pneumonia of left lower lobe due to infectious organism- Based on his symptoms, exam, and chest x-ray this is worsening and he now has a large left hydropneumothorax.  He will be transported to the ED for urgent evaluation and treatment. -     DG Chest 2 View; Future   I have discontinued Zaccai Chavarin. [facility]ounts "[name] [name]"'s Omega-3 Fatty Acids (OMEGA-3 FISH OIL PO) and thiamine. I am also having him maintain his Turmeric, multivitamin, tadalafil, meloxicam, pantoprazole, [facility] Zinc Gluconate, triazolam, cefpodoxime, and azithromycin.  No orders of the defined types were placed in this encounter.    Follow-up: No follow-ups on file.  Scarlette Calico, MD

## 2020-10-25 NOTE — ED Notes (Signed)
Called RT for arterial blood gas.

## 2020-10-25 NOTE — Consult Note (Signed)
[address]       [place] [address]             [phone]        Dimitri D Abrams [facility] Medical Record #[id number] Date of Birth: 09/29/1956  Referring: No ref. provider found Primary Care: Janith Lima, MD Primary Cardiologist:No primary care provider on file.  Chief Complaint:    Chief Complaint  Patient presents with  . Pneumonia  Patient examined, images of CT scan of chest personally reviewed and discussed with patient.  History of Present Illness:     64 year old non-smoker admitted to the ED with left empyema and compressive atelectasis of the left lung by CT scan. The patient was seen in the ED 7 to 10 days ago with flulike symptoms and productive cough.  Chest x-ray showed a left lower lobe pneumonia and he was placed on oral antibiotics. He did not improve and developed pleuritic chest pain shortness of breath loss of appetite and malaise.  A visit to his primary care physician today with chest x-ray showed opacification of the left hemithorax and he was sent to the ED.  CT scan shows large left empyema with almost complete atelectasis of the left lung.  There is no pericardial effusion.  Today his white count is 11,000 and hemoglobin 11.3.  He is afebrile and nontoxic but very uncomfortable.  The patient is being admitted for planned left VATS and drainage of empyema and decortication of left lung in the morning.  The patient had a previous lap band operation 8 years ago and tolerated that operation and anesthesia well.  He has no cardiac history.  He has no history of thoracic trauma.  He is allergic to penicillin.  He lives with his wife and is currently helping assist his sister who has small cell lung cancer. 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 Zubrod Score: At the time of surgery this patient's most appropriate  activity status/level should be described as: []     0    Normal activity, no symptoms []     1    Restricted in physical strenuous activity but ambulatory, able to do out light work [x]     2    Ambulatory and capable of self care, unable to do work activities, up and about                 more than 50%  Of the time                            []     3    Only limited self care, in bed greater than 50% of waking hours []     4    Completely disabled, no self care, confined to bed or chair []     5    Moribund  Past Medical History:  Diagnosis Date  . Allergy   . Diabetes mellitus    type 2-off all meds now after wt loss  . GERD (gastroesophageal reflux disease)   . Hyperlipidemia   . Hyperlipidemia   . Hypertension   . Osteoarthritis    hands, mild, not disabling    Past Surgical History:  Procedure Laterality Date  . Arthroscopy X 1 left ([name])     X 2 right  .  COLONOSCOPY    . correction of deviated septum    . GANGLION CYST EXCISION     right wrist (kuzma)  . GASTRIC BANDING PORT REVISION  8/11  . PILONIDAL CYST / SINUS EXCISION  64 yrs old  . TONSILLECTOMY    . ULNAR COLLATERAL LIGAMENT REPAIR Right 09/21/2013   Procedure: ULNAR COLLATERAL LIGAMENT REPAIR METACARPAL PHALANGE RIGHT THUMB POSSIBLE APL GRAFT RECONSTRUCTION;  Surgeon: Wynonia Sours, MD;  Location: [facility];  Service: Orthopedics;  Laterality: Right;    Social History   Tobacco Use  Smoking Status Former Smoker  . Quit date: 06/13/1995  . Years since quitting: 25.3  Smokeless Tobacco Never Used    Social History   Substance and Sexual Activity  Alcohol Use No  . Alcohol/week: 0.0 standard drinks     Allergies  Allergen Reactions  . Penicillins Anaphylaxis    Did it involve swelling of the face/tongue/throat, SOB, or low BP? Y Did it involve sudden or severe rash/hives, skin peeling, or any reaction on the inside of your mouth or nose? Y Did you need to seek medical attention at a  hospital or doctor's office? Y When did it last happen?Childhood If all above answers are "NO", may proceed with cephalosporin use.    . Codeine Nausea And Vomiting    Current Facility-Administered Medications  Medication Dose Route Frequency Provider Last Rate Last Admin  . acetaminophen (TYLENOL) tablet 650 mg  650 mg Oral Q6H PRN Norval Morton, MD       Or  . acetaminophen (TYLENOL) suppository 650 mg  650 mg Rectal Q6H PRN Smith, Rondell A, MD      . albuterol (PROVENTIL) (2.5 MG/3ML) 0.083% nebulizer solution 2.5 mg  2.5 mg Nebulization Q6H PRN Smith, Rondell A, MD      . ceFEPIme (MAXIPIME) 2 g in sodium chloride 0.9 % 100 mL IVPB  2 g Intravenous Once Fuller Plan A, MD 200 mL/hr at 10/25/20 1817 2 g at 10/25/20 1817  . enoxaparin (LOVENOX) injection 40 mg  40 mg Subcutaneous Q24H Smith, Rondell A, MD      . HYDROmorphone (DILAUDID) injection 0.5 mg  0.5 mg Intravenous Q3H PRN Smith, Rondell A, MD      . metroNIDAZOLE (FLAGYL) IVPB 500 mg  500 mg Intravenous Once Norval Morton, MD      . Derrill Memo ON 10/26/2020] metroNIDAZOLE (FLAGYL) IVPB 500 mg  500 mg Intravenous Q8H Smith, Rondell A, MD      . ondansetron (ZOFRAN) tablet 4 mg  4 mg Oral Q6H PRN Fuller Plan A, MD       Or  . ondansetron (ZOFRAN) injection 4 mg  4 mg Intravenous Q6H PRN Smith, Rondell A, MD      . oxyCODONE-acetaminophen (PERCOCET/ROXICET) 5-325 MG per tablet 1 tablet  1 tablet Oral Q6H PRN Norval Morton, MD      . Derrill Memo ON 10/26/2020] pantoprazole (PROTONIX) EC tablet 40 mg  40 mg Oral Daily Smith, Rondell A, MD      . sodium chloride flush (NS) 0.9 % injection 3 mL  3 mL Intravenous Q12H Smith, Rondell A, MD      . triazolam (HALCION) tablet 0.25 mg  0.25 mg Oral QHS PRN Smith, Rondell A, MD      . vancomycin (VANCOREADY) IVPB 1750 mg/350 mL  1,750 mg Intravenous Once Norval Morton, MD       Current Outpatient Medications  Medication Sig Dispense Refill  .  azithromycin (ZITHROMAX) 250 MG  tablet Take 1 tablet (250 mg total) by mouth daily. Take first 2 tablets together, then 1 every day until finished. 6 tablet 0  . cefpodoxime (VANTIN) 200 MG tablet Take 1 tablet (200 mg total) by mouth 2 (two) times daily for 7 days. 14 tablet 0  . cefUROXime (CEFTIN) 500 MG tablet Take 500 mg by mouth 2 (two) times daily.    . [facility] ZINC GLUCONATE 50 MG tablet TAKE 1 TABLET (50 MG TOTAL) BY MOUTH EVERY OTHER DAY. 45 tablet 1  . meloxicam (MOBIC) 15 MG tablet TAKE 1 TABLET (15 MG TOTAL) BY MOUTH DAILY. **OFFICE VISIT IS DUE** 90 tablet 0  . Multiple Vitamin (MULTIVITAMIN) tablet Take 1 tablet by mouth daily.    . pantoprazole (PROTONIX) 40 MG tablet TAKE 1 TABLET BY MOUTH EVERY DAY (Patient not taking: No sig reported) 90 tablet 1  . tadalafil (CIALIS) 20 MG tablet TAKE 1 TABLET BY MOUTH EVERY DAY AS NEEDED FOR ERECTILE DYSFUNCTION 20 tablet 5  . triazolam (HALCION) 0.25 MG tablet TAKE 1 TABLET (0.25 MG TOTAL) BY MOUTH AT BEDTIME AS NEEDED. 90 tablet 1  . Turmeric 450 MG CAPS Take 1 capsule by mouth 2 (two) times daily with a meal. 180 capsule 3    (Not in a hospital admission)   Family History  Problem Relation Age of Onset  . Cancer Father        lung cancer metatstatic cancer to brain  . Coronary artery disease Father   . Heart attack Father        X 2 (41,45)  . Heart disease Father        CAD/MI x 2  . Cancer Mother        rapid on set  . Hypertension Mother   . Parkinsonism Maternal Grandfather   . Diabetes Neg Hx   . Colon cancer Neg Hx   . Colon polyps Neg Hx   . Esophageal cancer Neg Hx   . Rectal cancer Neg Hx   . Stomach cancer Neg Hx      Review of Systems:   ROS   y  Cardiac Review of Systems: Y or  [    ]= no  Chest Pain [  y  ]  Resting SOB [   ] Exertional SOB  [ y ]  Orthopnea [  ]   Pedal Edema [   ]    Palpitations [  ] Syncope  [  ]   Presyncope [   ]  General Review of Systems: [Y] = yes [  ]=no Constitional: recent weight change [ y ]; anorexia [ y ];  fatigue [name] [name]  ]; nausea [  ]; night sweats [  ]; fever [  ]; or chills [  ]                                                               Dental: Last Dentist visit:   Eye : blurred vision [  ]; diplopia [   ]; vision changes [  ];  Amaurosis fugax[  ]; Resp: cough [name] [name]  ];  wheezing[  ];  hemoptysis[  ]; shortness of breath[ y ]; paroxysmal nocturnal dyspnea[  ]; dyspnea on exertion[  ];  or orthopnea[  ];  GI:  gallstones[  ], vomiting[  ];  dysphagia[  ]; melena[  ];  hematochezia [  ]; heartburn[  ];   Hx of  Colonoscopy[  ]; GU: kidney stones [  ]; hematuria[  ];   dysuria [  ];  nocturia[  ];  history of     obstruction [  ]; urinary frequency [  ]             Skin: rash, swelling[  ];, hair loss[  ];  peripheral edema[  ];  or itching[  ]; Musculosketetal: myalgias[  ];  joint swelling[  ];  joint erythema[  ];  joint pain[  ];  back pain[  ];  Heme/Lymph: bruising[  ];  bleeding[  ];  anemia[  ];  Neuro: TIA[  ];  headaches[  ];  stroke[  ];  vertigo[  ];  seizures[  ];   paresthesias[  ];  difficulty walking[  ];  Psych:depression[  ]; anxiety[  ];  Endocrine: diabetes[  ];  thyroid dysfunction[  ];       Physical Exam: BP (!) 133/99   Pulse 85   Temp 98.2 F (36.8 C) (Oral)   Resp (!) 22   SpO2 94%        Exam    General- alert and appropriate but uncomfortable complaining of left pleuritic chest pain    Neck- no JVD, no cervical adenopathy palpable, no carotid bruit   Lungs- clear without rales, wheezes on the right.  Diminished breath sounds on the left.   Cor- regular rate and rhythm, no murmur , gallop   Abdomen- soft, non-tender.  Well-healed surgical incision.   Extremities - warm, non-tender, minimal edema   Neuro- oriented, appropriate, no focal weakness     Diagnostic Studies & Laboratory data:     Recent Radiology Findings:   DG Chest 2 View  Result Date: 10/25/2020 CLINICAL DATA:  Chest pain, dyspnea.  Pneumonia. EXAM: CHEST - 2 VIEW COMPARISON:  October 18, 2020. FINDINGS: Interval development of air-fluid level is noted posteriorly in the left upper lobe consistent with moderate to large left hydropneumothorax. There is otherwise nearly complete opacification of the left hemithorax consistent with pneumonia. Right lung is clear. Bony thorax is unremarkable. IMPRESSION: Moderate to large left hydropneumothorax is noted with otherwise nearly complete opacification of left hemithorax consistent with pneumonia. CT scan is recommended for further evaluation. Electronically Signed   By: Marijo Conception M.D.   On: 10/25/2020 09:16   CT Chest W Contrast  Result Date: 10/25/2020 CLINICAL DATA:  Continued cough and low-grade fever since finishing antibiotics for pneumonia. EXAM: CT CHEST WITH CONTRAST TECHNIQUE: Multidetector CT imaging of the chest was performed during intravenous contrast administration. CONTRAST:  72mL OMNIPAQUE IOHEXOL 300 MG/ML  SOLN COMPARISON:  Chest x-ray from same day. CT chest dated September 25, 2011. FINDINGS: Cardiovascular: Normal heart size. No pericardial effusion. Aneurysmal dilatation of the ascending thoracic aorta measuring up to 4.2 cm. No dissection. Coronary, aortic arch, and branch vessel atherosclerotic vascular disease. No pulmonary embolism. Mediastinum/Nodes: Slight rightward mediastinal shift. No enlarged mediastinal, hilar, or axillary lymph nodes. Patulous esophagus. Thyroid gland and trachea demonstrate no significant findings. Lungs/Pleura: Large loculated gas and fluid collection in the left pleural space with pleural thickening and enhancement. The collection essentially fills the entire left hemithorax with severe mass effect on the left lung which is mostly collapsed. The right lung is clear. Upper Abdomen:  No acute abnormality. Prior gastric lap band surgery. Musculoskeletal: No chest wall abnormality. No acute or significant osseous findings. Old left-sided rib fractures. IMPRESSION: 1. Large left-sided empyema  essentially filling the entire left hemithorax with near complete collapse of the left lung. 2. 4.2 cm ascending aortic aneurysm. Recommend annual imaging followup by CTA or MRA. This recommendation follows 2010 ACCF/AHA/AATS/ACR/ASA/SCA/SCAI/SIR/STS/SVM Guidelines for the Diagnosis and Management of Patients with Thoracic Aortic Disease. Circulation. 2010; 121: O115-B262. Aortic aneurysm NOS (ICD10-I71.9) 3. Aortic Atherosclerosis (ICD10-I70.0). Electronically Signed   By: Titus Dubin M.D.   On: 10/25/2020 15:49     I have independently reviewed the above radiologic studies and discussed with the patient   Recent Lab Findings: Lab Results  Component Value Date   WBC 11.2 (H) 10/25/2020   HGB 11.3 (L) 10/25/2020   HCT 35.2 (L) 10/25/2020   PLT 305 10/25/2020   GLUCOSE 115 (H) 10/25/2020   CHOL 168 02/24/2020   TRIG 135.0 02/24/2020   HDL 48.70 02/24/2020   LDLDIRECT 118.0 05/14/2018   LDLCALC 92 02/24/2020   ALT 37 10/18/2020   AST 35 10/18/2020   NA 136 10/25/2020   K 4.2 10/25/2020   CL 97 (L) 10/25/2020   CREATININE 0.78 10/25/2020   BUN 13 10/25/2020   CO2 25 10/25/2020   TSH 2.62 02/24/2020   INR 1.2 (H) 03/01/2020   HGBA1C 5.7 02/24/2020      Assessment / Plan:   Left lower lobe pneumonia progressing to left empyema with large loculated collection of fluid in the left pleural space with  compression of the left lung  The patient will be prepared for left VATS for drainage of empyema and decortication of the left lung with reexpansion and chest tube drainage.  I discussed the procedure in detail with the patient including the benefits and risks and he understands and agrees to proceed.    10/25/2020 6:28 PM

## 2020-10-25 NOTE — ED Notes (Signed)
Offered to place pt on oxygen via nasal cannula (for comfort)  however pt refused, pt satting at 94-95% on room air.

## 2020-10-25 NOTE — Progress Notes (Signed)
Pharmacy Antibiotic Note  [name] [name] is a 64 y.o. male admitted on 10/25/2020 with pneumonia and sepsis.  Pharmacy has been consulted for Cefepime and Vancomycin dosing.      Temp (24hrs), Avg:98.5 F (36.9 C), Min:98.2 F (36.8 C), Max:98.8 F (37.1 C)  Recent Labs  Lab 10/25/20 1008  WBC 11.2*  CREATININE 0.78    Estimated Creatinine Clearance: 107 mL/min (by C-G formula based on SCr of 0.78 mg/dL).    Allergies  Allergen Reactions  . Penicillins Anaphylaxis    Did it involve swelling of the face/tongue/throat, SOB, or low BP? Y Did it involve sudden or severe rash/hives, skin peeling, or any reaction on the inside of your mouth or nose? Y Did you need to seek medical attention at a hospital or doctor's office? Y When did it last happen?Childhood If all above answers are "NO", may proceed with cephalosporin use.    . Codeine Nausea And Vomiting    Antimicrobials this admission: 12/16 Cefepime >>  12/16 Vancomycin >>   Dose adjustments this admission: N/a  Microbiology results: Pending   Plan:  - Cefepime 2g IV q8h - Vancomycin 1750mg  IV x 1 dose  - Followed by Vancomycin 1500mg  IV q12h (avoiding q8h dosing in patients > 22 yo) - Goal trough ~ 15 - Monitor patients renal function and urine output  - De-escalate ABX when appropriate   Thank you for allowing pharmacy to be a part of this patient's care.  Duanne Limerick PharmD. BCPS 10/25/2020 6:32 PM

## 2020-10-25 NOTE — ED Notes (Signed)
Pt ambulated to and from bathroom with a steady gait. Is back in bed, side rails up and is back on cardiac monitor. Is requesting pain meds. Tyrone Nine, MD has been notified.

## 2020-10-25 NOTE — ED Triage Notes (Signed)
Pt dx with pneumonia on Thursday, given IV and rx abx, which he has finished. Still having continued L sided chest wall pain and exertional shob. Went for f/u at PCP who did CXR and sent here for further eval/possible CT scan d/t hydropneumothorax.

## 2020-10-26 ENCOUNTER — Encounter (HOSPITAL_COMMUNITY): Payer: Self-pay | Admitting: Internal Medicine

## 2020-10-26 ENCOUNTER — Encounter (HOSPITAL_COMMUNITY)
Admission: EM | Disposition: A | Discharge status: Home Health | Payer: Self-pay | Source: Home / Self Care | Attending: Cardiothoracic Surgery

## 2020-10-26 ENCOUNTER — Inpatient Hospital Stay (HOSPITAL_COMMUNITY): Payer: PPO | Admitting: Certified Registered Nurse Anesthetist

## 2020-10-26 ENCOUNTER — Inpatient Hospital Stay (HOSPITAL_COMMUNITY): Payer: PPO

## 2020-10-26 DIAGNOSIS — J869 Pyothorax without fistula: Secondary | ICD-10-CM | POA: Diagnosis present

## 2020-10-26 HISTORY — PX: DECORTICATION: SHX5101

## 2020-10-26 HISTORY — PX: VIDEO ASSISTED THORACOSCOPY (VATS)/EMPYEMA: SHX6172

## 2020-10-26 LAB — CBC
HCT: 32.2 % — ABNORMAL LOW (ref 39.0–52.0)
Hemoglobin: 10.2 g/dL — ABNORMAL LOW (ref 13.0–17.0)
MCH: 29 pg (ref 26.0–34.0)
MCHC: 31.7 g/dL (ref 30.0–36.0)
MCV: 91.5 fL (ref 80.0–100.0)
Platelets: 181 10*3/uL (ref 150–400)
RBC: 3.52 MIL/uL — ABNORMAL LOW (ref 4.22–5.81)
RDW: 13.4 % (ref 11.5–15.5)
WBC: 9.9 10*3/uL (ref 4.0–10.5)
nRBC: 0 % (ref 0.0–0.2)

## 2020-10-26 LAB — URINALYSIS, ROUTINE W REFLEX MICROSCOPIC
Bacteria, UA: NONE SEEN
Bilirubin Urine: NEGATIVE
Glucose, UA: NEGATIVE mg/dL
Hgb urine dipstick: NEGATIVE
Ketones, ur: NEGATIVE mg/dL
Leukocytes,Ua: NEGATIVE
Nitrite: NEGATIVE
Protein, ur: 30 mg/dL — AB
Specific Gravity, Urine: 1.038 — ABNORMAL HIGH (ref 1.005–1.030)
pH: 5 (ref 5.0–8.0)

## 2020-10-26 LAB — BASIC METABOLIC PANEL
Anion gap: 9 (ref 5–15)
BUN: 14 mg/dL (ref 8–23)
CO2: 21 mmol/L — ABNORMAL LOW (ref 22–32)
Calcium: 8 mg/dL — ABNORMAL LOW (ref 8.9–10.3)
Chloride: 105 mmol/L (ref 98–111)
Creatinine, Ser: 0.66 mg/dL (ref 0.61–1.24)
GFR, Estimated: >60 mL/min (ref >60)
Glucose, Bld: 89 mg/dL (ref 70–99)
Potassium: 4.2 mmol/L (ref 3.5–5.1)
Sodium: 135 mmol/L (ref 135–145)

## 2020-10-26 LAB — BLOOD GAS, ARTERIAL
Acid-Base Excess: 2.4 mmol/L — ABNORMAL HIGH (ref 0.0–2.0)
Bicarbonate: 26.3 mmol/L (ref 20.0–28.0)
Drawn by: 444751
FIO2: 97
O2 Saturation: 93 %
Patient temperature: 36.2
pCO2 arterial: 38.5 mmHg (ref 32.0–48.0)
pH, Arterial: 7.445 (ref 7.350–7.450)
pO2, Arterial: 66.8 mmHg — ABNORMAL LOW (ref 83.0–108.0)

## 2020-10-26 LAB — GLUCOSE, CAPILLARY
Glucose-Capillary: 100 mg/dL — ABNORMAL HIGH (ref 70–99)
Glucose-Capillary: 110 mg/dL — ABNORMAL HIGH (ref 70–99)
Glucose-Capillary: 114 mg/dL — ABNORMAL HIGH (ref 70–99)
Glucose-Capillary: 135 mg/dL — ABNORMAL HIGH (ref 70–99)

## 2020-10-26 SURGERY — VIDEO ASSISTED THORACOSCOPY (VATS)/EMPYEMA
Anesthesia: General | Site: Chest | Laterality: Left

## 2020-10-26 MED ORDER — VANCOMYCIN HCL 1000 MG IV SOLR
INTRAVENOUS | Status: DC | PRN
  Administered 2020-10-26: 1000 mL

## 2020-10-26 MED ORDER — OXYCODONE HCL 5 MG PO TABS
ORAL_TABLET | ORAL | Status: AC
  Filled 2020-10-26: qty 1

## 2020-10-26 MED ORDER — DEXAMETHASONE SODIUM PHOSPHATE 10 MG/ML IJ SOLN
INTRAMUSCULAR | Status: DC | PRN
  Administered 2020-10-26: 10 mg via INTRAVENOUS

## 2020-10-26 MED ORDER — FENTANYL CITRATE (PF) 250 MCG/5ML IJ SOLN
INTRAMUSCULAR | Status: AC
  Filled 2020-10-26: qty 5

## 2020-10-26 MED ORDER — ACETAMINOPHEN 500 MG PO TABS
1000.0000 mg | ORAL_TABLET | Freq: Four times a day (QID) | ORAL | Status: AC
  Administered 2020-10-26 – 2020-10-31 (×18): 1000 mg via ORAL
  Filled 2020-10-26 (×18): qty 2

## 2020-10-26 MED ORDER — KETOROLAC TROMETHAMINE 15 MG/ML IJ SOLN
15.0000 mg | Freq: Four times a day (QID) | INTRAMUSCULAR | Status: AC
  Administered 2020-10-26 – 2020-10-27 (×2): 15 mg via INTRAVENOUS
  Filled 2020-10-26 (×2): qty 1

## 2020-10-26 MED ORDER — ONDANSETRON HCL 4 MG/2ML IJ SOLN: 4.0000 mg | Freq: Once | INTRAMUSCULAR | Status: DC | PRN

## 2020-10-26 MED ORDER — LACTATED RINGERS IV SOLN: INTRAVENOUS | Status: DC | PRN

## 2020-10-26 MED ORDER — FENTANYL CITRATE (PF) 250 MCG/5ML IJ SOLN
INTRAMUSCULAR | Status: DC | PRN
  Administered 2020-10-26 (×3): 50 ug via INTRAVENOUS
  Administered 2020-10-26: 100 ug via INTRAVENOUS
  Administered 2020-10-26 (×2): 50 ug via INTRAVENOUS

## 2020-10-26 MED ORDER — HYDRALAZINE HCL 20 MG/ML IJ SOLN
INTRAMUSCULAR | Status: AC
  Filled 2020-10-26: qty 1

## 2020-10-26 MED ORDER — BUPIVACAINE 0.5 % ON-Q PUMP SINGLE CATH 400 ML
400.0000 mL | INJECTION | Status: DC
  Filled 2020-10-26: qty 400

## 2020-10-26 MED ORDER — NALOXONE HCL 0.4 MG/ML IJ SOLN: 0.4000 mg | INTRAMUSCULAR | Status: DC | PRN

## 2020-10-26 MED ORDER — KETOROLAC TROMETHAMINE 0.5 % OP SOLN
OPHTHALMIC | Status: AC
  Filled 2020-10-26: qty 5

## 2020-10-26 MED ORDER — KETOROLAC TROMETHAMINE 30 MG/ML IJ SOLN
INTRAMUSCULAR | Status: DC | PRN
  Administered 2020-10-26: 30 mg via INTRAVENOUS

## 2020-10-26 MED ORDER — PROPOFOL 10 MG/ML IV BOLUS
INTRAVENOUS | Status: AC
  Filled 2020-10-26: qty 40

## 2020-10-26 MED ORDER — DEXAMETHASONE SODIUM PHOSPHATE 10 MG/ML IJ SOLN
INTRAMUSCULAR | Status: AC
  Filled 2020-10-26: qty 1

## 2020-10-26 MED ORDER — OXYCODONE HCL 5 MG/5ML PO SOLN
5.0000 mg | Freq: Once | ORAL | Status: AC | PRN
Start: 2020-10-26 — End: 2020-10-26

## 2020-10-26 MED ORDER — KETOROLAC TROMETHAMINE 30 MG/ML IJ SOLN
INTRAMUSCULAR | Status: AC
  Filled 2020-10-26: qty 1

## 2020-10-26 MED ORDER — ACETAMINOPHEN 500 MG PO TABS
ORAL_TABLET | ORAL | Status: AC
  Filled 2020-10-26: qty 2

## 2020-10-26 MED ORDER — DIPHENHYDRAMINE HCL 12.5 MG/5ML PO ELIX: 12.5000 mg | ORAL_SOLUTION | Freq: Four times a day (QID) | ORAL | Status: DC | PRN

## 2020-10-26 MED ORDER — SENNOSIDES-DOCUSATE SODIUM 8.6-50 MG PO TABS
1.0000 | ORAL_TABLET | Freq: Every day | ORAL | Status: DC
  Administered 2020-10-26 – 2020-11-01 (×7): 1 via ORAL
  Filled 2020-10-26 (×7): qty 1

## 2020-10-26 MED ORDER — BUPIVACAINE 0.5 % ON-Q PUMP SINGLE CATH 400 ML
INJECTION | Status: AC | PRN
  Administered 2020-10-26: 400 mL

## 2020-10-26 MED ORDER — ONDANSETRON HCL 4 MG/2ML IJ SOLN: 4.0000 mg | Freq: Four times a day (QID) | INTRAMUSCULAR | Status: DC | PRN

## 2020-10-26 MED ORDER — CHLORHEXIDINE GLUCONATE CLOTH 2 % EX PADS
6.0000 | MEDICATED_PAD | Freq: Every day | CUTANEOUS | Status: DC
  Administered 2020-10-26 – 2020-11-01 (×6): 6 via TOPICAL

## 2020-10-26 MED ORDER — MIDAZOLAM HCL 2 MG/2ML IJ SOLN
INTRAMUSCULAR | Status: DC | PRN
  Administered 2020-10-26: 2 mg via INTRAVENOUS

## 2020-10-26 MED ORDER — FENTANYL CITRATE (PF) 100 MCG/2ML IJ SOLN
INTRAMUSCULAR | Status: AC
  Filled 2020-10-26: qty 2

## 2020-10-26 MED ORDER — PHENYLEPHRINE HCL-NACL 10-0.9 MG/250ML-% IV SOLN
INTRAVENOUS | Status: DC | PRN
  Administered 2020-10-26: 25 ug/min via INTRAVENOUS

## 2020-10-26 MED ORDER — DEXMEDETOMIDINE (PRECEDEX) IN NS 20 MCG/5ML (4 MCG/ML) IV SYRINGE
PREFILLED_SYRINGE | INTRAVENOUS | Status: DC | PRN
  Administered 2020-10-26 (×2): 8 ug via INTRAVENOUS

## 2020-10-26 MED ORDER — KETOROLAC TROMETHAMINE 0.5 % OP SOLN
1.0000 [drp] | Freq: Three times a day (TID) | OPHTHALMIC | Status: DC | PRN
  Administered 2020-10-26: 1 [drp] via OPHTHALMIC

## 2020-10-26 MED ORDER — SODIUM CHLORIDE 0.9 % IV SOLN: INTRAVENOUS | Status: DC | PRN

## 2020-10-26 MED ORDER — ONDANSETRON HCL 4 MG/2ML IJ SOLN
INTRAMUSCULAR | Status: DC | PRN
  Administered 2020-10-26: 4 mg via INTRAVENOUS

## 2020-10-26 MED ORDER — ROCURONIUM BROMIDE 10 MG/ML (PF) SYRINGE
PREFILLED_SYRINGE | INTRAVENOUS | Status: DC | PRN
  Administered 2020-10-26: 60 mg via INTRAVENOUS
  Administered 2020-10-26 (×3): 20 mg via INTRAVENOUS

## 2020-10-26 MED ORDER — MIDAZOLAM HCL 2 MG/2ML IJ SOLN
INTRAMUSCULAR | Status: AC
  Filled 2020-10-26: qty 2

## 2020-10-26 MED ORDER — BSS IO SOLN: 15.0000 mL | Freq: Once | INTRAOCULAR | Status: DC

## 2020-10-26 MED ORDER — SUGAMMADEX SODIUM 200 MG/2ML IV SOLN
INTRAVENOUS | Status: DC | PRN
  Administered 2020-10-26: 100 mg via INTRAVENOUS
  Administered 2020-10-26: 50 mg via INTRAVENOUS
  Administered 2020-10-26 (×2): 100 mg via INTRAVENOUS

## 2020-10-26 MED ORDER — TRAMADOL HCL 50 MG PO TABS
ORAL_TABLET | ORAL | Status: AC
  Filled 2020-10-26: qty 2

## 2020-10-26 MED ORDER — OXYCODONE HCL 5 MG PO TABS
5.0000 mg | ORAL_TABLET | Freq: Once | ORAL | Status: AC | PRN
Start: 2020-10-26 — End: 2020-10-26
  Administered 2020-10-26: 5 mg via ORAL

## 2020-10-26 MED ORDER — INSULIN ASPART 100 UNIT/ML ~~LOC~~ SOLN
0.0000 [IU] | SUBCUTANEOUS | Status: DC
  Administered 2020-10-26 – 2020-10-29 (×3): 2 [IU] via SUBCUTANEOUS
  Administered 2020-10-29: 4 [IU] via SUBCUTANEOUS
  Administered 2020-10-30: 2 [IU] via SUBCUTANEOUS

## 2020-10-26 MED ORDER — VANCOMYCIN HCL 1000 MG IV SOLR
INTRAVENOUS | Status: DC
  Filled 2020-10-26: qty 1000

## 2020-10-26 MED ORDER — ARTIFICIAL TEARS OPHTHALMIC OINT
TOPICAL_OINTMENT | OPHTHALMIC | Status: AC
  Filled 2020-10-26: qty 3.5

## 2020-10-26 MED ORDER — PROPOFOL 10 MG/ML IV BOLUS
INTRAVENOUS | Status: DC | PRN
  Administered 2020-10-26: 20 mg via INTRAVENOUS
  Administered 2020-10-26: 150 mg via INTRAVENOUS
  Administered 2020-10-26: 30 mg via INTRAVENOUS
  Administered 2020-10-26: 10 mg via INTRAVENOUS
  Administered 2020-10-26: 30 mg via INTRAVENOUS

## 2020-10-26 MED ORDER — BISACODYL 5 MG PO TBEC
10.0000 mg | DELAYED_RELEASE_TABLET | Freq: Every day | ORAL | Status: DC
  Administered 2020-10-27 – 2020-11-01 (×6): 10 mg via ORAL
  Filled 2020-10-26 (×6): qty 2

## 2020-10-26 MED ORDER — BUPIVACAINE ON-Q PAIN PUMP (FOR ORDER SET NO CHG): INJECTION | Status: DC

## 2020-10-26 MED ORDER — LIDOCAINE 2% (20 MG/ML) 5 ML SYRINGE
INTRAMUSCULAR | Status: AC
  Filled 2020-10-26: qty 5

## 2020-10-26 MED ORDER — BUPIVACAINE HCL (PF) 0.5 % IJ SOLN
INTRAMUSCULAR | Status: AC
  Filled 2020-10-26: qty 10

## 2020-10-26 MED ORDER — SODIUM CHLORIDE 0.9% FLUSH: 9.0000 mL | INTRAVENOUS | Status: DC | PRN

## 2020-10-26 MED ORDER — TRAMADOL HCL 50 MG PO TABS
50.0000 mg | ORAL_TABLET | Freq: Four times a day (QID) | ORAL | Status: DC | PRN
Start: 2020-10-26 — End: 2020-10-28
  Administered 2020-10-26 – 2020-10-28 (×7): 100 mg via ORAL
  Filled 2020-10-26 (×6): qty 2

## 2020-10-26 MED ORDER — SODIUM CHLORIDE 0.9 % IV SOLN: INTRAVENOUS | Status: DC

## 2020-10-26 MED ORDER — FENTANYL 2500MCG IN NS 250ML (10MCG/ML) PREMIX INFUSION
INTRAVENOUS | Status: AC
  Filled 2020-10-26: qty 250

## 2020-10-26 MED ORDER — BUPIVACAINE HCL (PF) 0.5 % IJ SOLN
INTRAMUSCULAR | Status: DC | PRN
  Administered 2020-10-26: 10 mL

## 2020-10-26 MED ORDER — HEMOSTATIC AGENTS (NO CHARGE) OPTIME
TOPICAL | Status: DC | PRN
  Administered 2020-10-26: 1 via TOPICAL

## 2020-10-26 MED ORDER — ACETAMINOPHEN 160 MG/5ML PO SOLN: 1000.0000 mg | Freq: Four times a day (QID) | ORAL | Status: AC

## 2020-10-26 MED ORDER — ROCURONIUM BROMIDE 10 MG/ML (PF) SYRINGE
PREFILLED_SYRINGE | INTRAVENOUS | Status: AC
  Filled 2020-10-26: qty 10

## 2020-10-26 MED ORDER — 0.9 % SODIUM CHLORIDE (POUR BTL) OPTIME
TOPICAL | Status: DC | PRN
  Administered 2020-10-26: 2000 mL

## 2020-10-26 MED ORDER — SUGAMMADEX SODIUM 200 MG/2ML IV SOLN: INTRAVENOUS | Status: DC | PRN

## 2020-10-26 MED ORDER — FENTANYL 50 MCG/ML IV PCA SOLN
INTRAVENOUS | Status: DC
Start: 2020-10-26 — End: 2020-10-27
  Administered 2020-10-26: 150 ug via INTRAVENOUS
  Administered 2020-10-26: 135 ug via INTRAVENOUS
  Administered 2020-10-27: 75 ug via INTRAVENOUS
  Administered 2020-10-27: 105 ug via INTRAVENOUS
  Administered 2020-10-27: 45 ug via INTRAVENOUS

## 2020-10-26 MED ORDER — DIPHENHYDRAMINE HCL 50 MG/ML IJ SOLN: 12.5000 mg | Freq: Four times a day (QID) | INTRAMUSCULAR | Status: DC | PRN

## 2020-10-26 MED ORDER — FENTANYL CITRATE (PF) 100 MCG/2ML IJ SOLN
25.0000 ug | INTRAMUSCULAR | Status: DC | PRN
  Administered 2020-10-26 (×3): 50 ug via INTRAVENOUS

## 2020-10-26 MED ORDER — HYDRALAZINE HCL 20 MG/ML IJ SOLN
10.0000 mg | INTRAMUSCULAR | Status: DC | PRN
  Administered 2020-10-26 (×2): 10 mg via INTRAVENOUS

## 2020-10-26 SURGICAL SUPPLY — 61 items
APPLICATOR COTTON TIP 6 STRL (MISCELLANEOUS) ×2 IMPLANT
APPLICATOR COTTON TIP 6IN STRL (MISCELLANEOUS) ×4
BLADE SURG 11 STRL SS (BLADE) ×4 IMPLANT
CANISTER SUCT 3000ML PPV (MISCELLANEOUS) ×4 IMPLANT
CATH KIT ON-Q SILVERSOAK 5IN (CATHETERS) ×4 IMPLANT
CATH THORACIC 28FR (CATHETERS) ×4 IMPLANT
CATH THORACIC 28FR RT ANG (CATHETERS) IMPLANT
CATH THORACIC 36FR (CATHETERS) IMPLANT
CATH THORACIC 36FR RT ANG (CATHETERS) ×4 IMPLANT
CNTNR URN SCR LID CUP LEK RST (MISCELLANEOUS) ×4 IMPLANT
CONN 3/8X3/8 GISH STERILE (MISCELLANEOUS) ×4 IMPLANT
CONN ST 1/4X3/8  BEN (MISCELLANEOUS) ×4
CONN ST 1/4X3/8 BEN (MISCELLANEOUS) ×2 IMPLANT
CONN Y 3/8X3/8X3/8  BEN (MISCELLANEOUS) ×4
CONN Y 3/8X3/8X3/8 BEN (MISCELLANEOUS) ×2 IMPLANT
CONT SPEC 4OZ STRL OR WHT (MISCELLANEOUS) ×8
COVER SURGICAL LIGHT HANDLE (MISCELLANEOUS) ×4 IMPLANT
DRAIN CHANNEL 32F RND 10.7 FF (WOUND CARE) ×4 IMPLANT
DRAPE LAPAROSCOPIC ABDOMINAL (DRAPES) ×4 IMPLANT
DRAPE SLUSH/WARMER DISC (DRAPES) ×4 IMPLANT
ELECT BLADE 4.0 EZ CLEAN MEGAD (MISCELLANEOUS) ×4
ELECT REM PT RETURN 9FT ADLT (ELECTROSURGICAL) ×4
ELECTRODE BLDE 4.0 EZ CLN MEGD (MISCELLANEOUS) ×2 IMPLANT
ELECTRODE REM PT RTRN 9FT ADLT (ELECTROSURGICAL) ×2 IMPLANT
GAUZE SPONGE 4X4 12PLY STRL (GAUZE/BANDAGES/DRESSINGS) ×4 IMPLANT
GAUZE SPONGE 4X4 12PLY STRL LF (GAUZE/BANDAGES/DRESSINGS) ×4 IMPLANT
GLOVE BIO SURGEON STRL SZ 6.5 (GLOVE) ×3 IMPLANT
GLOVE BIO SURGEON STRL SZ7.5 (GLOVE) ×8 IMPLANT
GLOVE BIO SURGEONS STRL SZ 6.5 (GLOVE) ×1
GLOVE BIOGEL M STRL SZ7.5 (GLOVE) ×4 IMPLANT
GLOVE SURG SS PI 6.0 STRL IVOR (GLOVE) ×8 IMPLANT
GLOVE SURG UNDER POLY LF SZ7 (GLOVE) ×4 IMPLANT
GOWN STRL REUS W/ TWL LRG LVL3 (GOWN DISPOSABLE) ×8 IMPLANT
GOWN STRL REUS W/TWL LRG LVL3 (GOWN DISPOSABLE) ×16
KIT BASIN OR (CUSTOM PROCEDURE TRAY) ×4 IMPLANT
KIT SUCTION CATH 14FR (SUCTIONS) ×8 IMPLANT
KIT TURNOVER KIT B (KITS) ×4 IMPLANT
NS IRRIG 1000ML POUR BTL (IV SOLUTION) ×8 IMPLANT
PACK CHEST (CUSTOM PROCEDURE TRAY) ×4 IMPLANT
PAD ARMBOARD 7.5X6 YLW CONV (MISCELLANEOUS) ×8 IMPLANT
SEALANT SURG COSEAL 4ML (VASCULAR PRODUCTS) ×4 IMPLANT
SOL ANTI FOG 6CC (MISCELLANEOUS) ×2 IMPLANT
SOLUTION ANTI FOG 6CC (MISCELLANEOUS) ×2
SPONGE TONSIL TAPE 1 RFD (DISPOSABLE) ×4 IMPLANT
SUT SILK  1 MH (SUTURE) ×12
SUT SILK 1 MH (SUTURE) ×6 IMPLANT
SUT SILK 2 0SH CR/8 30 (SUTURE) ×4 IMPLANT
SUT VIC AB 1 CTX 18 (SUTURE) ×8 IMPLANT
SUT VIC AB 2-0 CTX 36 (SUTURE) ×4 IMPLANT
SUT VIC AB 3-0 X1 27 (SUTURE) ×8 IMPLANT
SUT VICRYL 2 TP 1 (SUTURE) ×4 IMPLANT
SYR BULB IRRIG 60ML STRL (SYRINGE) ×12 IMPLANT
SYSTEM SAHARA CHEST DRAIN ATS (WOUND CARE) ×4 IMPLANT
TAPE CLOTH SURG 4X10 WHT LF (GAUZE/BANDAGES/DRESSINGS) ×4 IMPLANT
TOWEL GREEN STERILE (TOWEL DISPOSABLE) ×4 IMPLANT
TOWEL GREEN STERILE FF (TOWEL DISPOSABLE) ×4 IMPLANT
TRAP SPECIMEN MUCUS 40CC (MISCELLANEOUS) ×8 IMPLANT
TRAY FOLEY MTR SLVR 16FR STAT (SET/KITS/TRAYS/PACK) ×4 IMPLANT
TROCAR XCEL BLADELESS 5X75MML (TROCAR) ×4 IMPLANT
WATER STERILE IRR 1000ML POUR (IV SOLUTION) ×8 IMPLANT
YANKAUER SUCT BULB TIP NO VENT (SUCTIONS) ×4 IMPLANT

## 2020-10-26 NOTE — Anesthesia Postprocedure Evaluation (Signed)
Anesthesia Post Note  Patient: [name] [name]  Procedure(s) Performed: VIDEO ASSISTED THORACOSCOPY (VATS)/EMPYEMA ,INSERTION OF LEFT PLEURAL CHEST TUBES X 3 (Left Chest) DECORTICATION OF LEFT LUNG, APPLICATION OF ON Q-PUMP FOR PAIN MANAGEMENT (Left )     Patient location during evaluation: PACU Anesthesia Type: General Level of consciousness: awake and alert Pain management: pain level controlled Vital Signs Assessment: post-procedure vital signs reviewed and stable Respiratory status: spontaneous breathing, nonlabored ventilation and respiratory function stable Cardiovascular status: blood pressure returned to baseline and stable Postop Assessment: no apparent nausea or vomiting Anesthetic complications: no   No complications documented.  Last Vitals:  Vitals:   10/26/20 1215 10/26/20 1230  BP: (!) 162/85 (!) 147/84  Pulse: 81 84  Resp: 20 (!) 21  Temp:    SpO2: 94% 94%    Last Pain:  Vitals:   10/26/20 1200  TempSrc:   PainSc: [place]

## 2020-10-26 NOTE — Plan of Care (Signed)
[facility] General Care Plan initiated

## 2020-10-26 NOTE — ED Notes (Signed)
ER called--they will come get the patient for surgery at [facility]

## 2020-10-26 NOTE — Anesthesia Procedure Notes (Signed)
Central Venous Catheter Insertion Performed by: Roberts Gaudy, MD, anesthesiologist Start/End12/17/2021 7:05 AM, 10/26/2020 7:15 AM Patient location: Pre-op. Preanesthetic checklist: patient identified, IV checked, site marked, risks and benefits discussed, surgical consent, monitors and equipment checked, pre-op evaluation, timeout performed and anesthesia consent Lidocaine 1% used for infiltration and patient sedated Hand hygiene performed  and maximum sterile barriers used  Catheter size: 8 Fr Total catheter length 16. Central line was placed.Double lumen Procedure performed without using ultrasound guided technique. Attempts: 1 Following insertion, dressing applied, line sutured and Biopatch. Post procedure assessment: blood return through all ports, free fluid flow and no air  Patient tolerated the procedure well with no immediate complications.

## 2020-10-26 NOTE — Brief Op Note (Addendum)
10/26/2020  10:20 AM  PATIENT:  [name] [name]  64 y.o. male  PRE-OPERATIVE DIAGNOSIS:  Left Empyema  POST-OPERATIVE DIAGNOSIS:  Left Empyema  PROCEDURE:  Procedure(s): VIDEO ASSISTED THORACOSCOPY (VATS)/EMPYEMA ,INSERTION OF LEFT PLEURAL CHEST TUBES X 3 (Left) DECORTICATION OF LEFT LUNG, APPLICATION OF ON Q-PUMP FOR PAIN MANAGEMENT (Left)  SURGEON:  Surgeon(s) and Role:    Ivin Poot, MD - Primary  PHYSICIAN ASSISTANT:  Nicholes Rough, PA-C  ANESTHESIA:   general  EBL:  50 mL   4L of brownish pus drained from the pleural space  BLOOD ADMINISTERED:none  DRAINS: ONE STRAIGHT CHEST TUBE, ONE RIGHT ANGLE CHEST TUBE, ONE BLAKE DRAIN   LOCAL MEDICATIONS USED: ON-Q pump SPECIMEN:  Source of Specimen:  pleural peel, pleural fluid  DISPOSITION OF SPECIMEN:  PATHOLOGY  COUNTS:  YES  DICTATION: .Dragon Dictation  PLAN OF CARE: Admit to inpatient   PATIENT DISPOSITION:  PACU- hemodynamically stable   Delay start of Pharmacological VTE agent (>24hrs) due to surgical blood loss or risk of bleeding: yes

## 2020-10-26 NOTE — Anesthesia Procedure Notes (Signed)
Procedure Name: Intubation Date/Time: 10/26/2020 7:56 AM Performed by: Rande Brunt, CRNA Pre-anesthesia Checklist: Patient identified, Emergency Drugs available, Suction available and Patient being monitored Patient Re-evaluated:Patient Re-evaluated prior to induction Oxygen Delivery Method: Circle System Utilized Preoxygenation: Pre-oxygenation with 100% oxygen Induction Type: IV induction Ventilation: Mask ventilation without difficulty Laryngoscope Size: Mac and 4 Grade View: Grade I Tube type: Oral Endobronchial tube: Double lumen EBT, EBT position confirmed by auscultation and Left and 39 Fr Number of attempts: 1 Airway Equipment and Method: Stylet Placement Confirmation: ETT inserted through vocal cords under direct vision,  positive ETCO2 and breath sounds checked- equal and bilateral Tube secured with: Tape Dental Injury: Teeth and Oropharynx as per pre-operative assessment  Comments: Attempted by CRNA A. Scherry Laverne, grade 1 view but popped out, placed by Dr. Fransisco Beau

## 2020-10-26 NOTE — Progress Notes (Signed)
Pre Procedure note for inpatients:   [name] [name] has been scheduled for Procedure(s): VIDEO ASSISTED THORACOSCOPY (VATS)/EMPYEMA (Left) today. The various methods of treatment have been discussed with the patient. After consideration of the risks, benefits and treatment options the patient has consented to the planned procedure.   The patient has been seen and labs reviewed. There are no changes in the patient's condition to prevent proceeding with the planned procedure today.  Recent labs:  Lab Results  Component Value Date   WBC 9.9 10/26/2020   HGB 10.2 (L) 10/26/2020   HCT 32.2 (L) 10/26/2020   PLT 181 10/26/2020   GLUCOSE 89 10/26/2020   CHOL 168 02/24/2020   TRIG 135.0 02/24/2020   HDL 48.70 02/24/2020   LDLDIRECT 118.0 05/14/2018   LDLCALC 92 02/24/2020   ALT 22 10/25/2020   AST 16 10/25/2020   NA 135 10/26/2020   K 4.2 10/26/2020   CL 105 10/26/2020   CREATININE 0.66 10/26/2020   BUN 14 10/26/2020   CO2 21 (L) 10/26/2020   TSH 2.62 02/24/2020   PSA 0.39 02/24/2020   INR 1.3 (H) 10/25/2020   HGBA1C 5.7 02/24/2020   MICROALBUR 4.2 (H) 12/30/2007    Len Childs, MD 10/26/2020 7:35 AM

## 2020-10-26 NOTE — Anesthesia Preprocedure Evaluation (Addendum)
Anesthesia Evaluation  Patient identified by MRN, date of birth, ID band Patient awake    Reviewed: Allergy & Precautions, NPO status , Patient's Chart, lab work & pertinent test results  History of Anesthesia Complications Negative for: history of anesthetic complications  Airway Mallampati: II  TM Distance: >3 FB Neck ROM: Full    Dental  (+) Dental Advisory Given   Pulmonary COPD, former smoker,   Left empyema    Pulmonary exam normal        Cardiovascular hypertension (no meds), Normal cardiovascular exam     Neuro/Psych negative neurological ROS  negative psych ROS   GI/Hepatic Neg liver ROS, GERD  Controlled,  Endo/Other  diabetes (no meds), Type 2  Renal/GU negative Renal ROS     Musculoskeletal  (+) Arthritis , Osteoarthritis,    Abdominal   Peds  Hematology  (+) anemia ,  INR 1.3    Anesthesia Other Findings Covid test negative   Reproductive/Obstetrics                            Anesthesia Physical Anesthesia Plan  ASA: III  Anesthesia Plan: General   Post-op Pain Management:    Induction: Intravenous  PONV Risk Score and Plan: 2 and Treatment may vary due to age or medical condition, Ondansetron, Dexamethasone and Midazolam  Airway Management Planned: Double Lumen EBT  Additional Equipment: Arterial line and CVP  Intra-op Plan:   Post-operative Plan: Possible Post-op intubation/ventilation  Informed Consent: I have reviewed the patients History and Physical, chart, labs and discussed the procedure including the risks, benefits and alternatives for the proposed anesthesia with the patient or authorized representative who has indicated his/her understanding and acceptance.     Dental advisory given  Plan Discussed with: CRNA and Anesthesiologist  Anesthesia Plan Comments:        Anesthesia Quick Evaluation

## 2020-10-26 NOTE — Progress Notes (Signed)
Patient off floor for VATS-- plan to go to [facility] when bed available.  Appreciate CVTS. Eulogio Bear DO

## 2020-10-26 NOTE — ED Notes (Signed)
Called OR and staff verbalized its ok for this RN to start vancomycin on pt since pt is going to OR at around 6am.

## 2020-10-26 NOTE — Anesthesia Procedure Notes (Signed)
Arterial Line Insertion Start/End12/17/2021 6:30 AM, 10/26/2020 6:45 AM Performed by: Rande Brunt, CRNA  Patient location: Pre-op. Preanesthetic checklist: patient identified, IV checked, site marked, risks and benefits discussed, surgical consent, monitors and equipment checked, pre-op evaluation, timeout performed and anesthesia consent Lidocaine 1% used for infiltration Right, radial was placed Catheter size: 20 G Hand hygiene performed  and maximum sterile barriers used   Attempts: 1 Procedure performed without using ultrasound guided technique. Following insertion, dressing applied and Biopatch. Post procedure assessment: normal and unchanged

## 2020-10-26 NOTE — Transfer of Care (Signed)
Immediate Anesthesia Transfer of Care Note  Patient: KONA LOVER  Procedure(s) Performed: VIDEO ASSISTED THORACOSCOPY (VATS)/EMPYEMA ,INSERTION OF LEFT PLEURAL CHEST TUBES X 3 (Left Chest) DECORTICATION OF LEFT LUNG, APPLICATION OF ON Q-PUMP FOR PAIN MANAGEMENT (Left )  Patient Location: PACU  Anesthesia Type:General  Level of Consciousness: awake, alert  and oriented  Airway & Oxygen Therapy: Patient Spontanous Breathing  Post-op Assessment: Report given to RN, Post -op Vital signs reviewed and stable and Patient moving all extremities  Post vital signs: Reviewed and stable  Last Vitals:  Vitals Value Taken Time  BP 131/88   Temp    Pulse 79 10/26/20 1029  Resp 15 10/26/20 1029  SpO2 91 % 10/26/20 1029  Vitals shown include unvalidated device data.  Last Pain:  Vitals:   10/26/20 0250  TempSrc:   PainSc: 5          Complications: No complications documented.

## 2020-10-27 ENCOUNTER — Inpatient Hospital Stay (HOSPITAL_COMMUNITY): Payer: PPO

## 2020-10-27 ENCOUNTER — Encounter (HOSPITAL_COMMUNITY): Payer: Self-pay | Admitting: Cardiothoracic Surgery

## 2020-10-27 LAB — ACID FAST SMEAR (AFB, MYCOBACTERIA)
Acid Fast Smear: NEGATIVE
Acid Fast Smear: NEGATIVE

## 2020-10-27 LAB — BASIC METABOLIC PANEL
Anion gap: 10 (ref 5–15)
BUN: 17 mg/dL (ref 8–23)
CO2: 24 mmol/L (ref 22–32)
Calcium: 8.6 mg/dL — ABNORMAL LOW (ref 8.9–10.3)
Chloride: 102 mmol/L (ref 98–111)
Creatinine, Ser: 0.68 mg/dL (ref 0.61–1.24)
GFR, Estimated: >60 mL/min (ref >60)
Glucose, Bld: 113 mg/dL — ABNORMAL HIGH (ref 70–99)
Potassium: 4.4 mmol/L (ref 3.5–5.1)
Sodium: 136 mmol/L (ref 135–145)

## 2020-10-27 LAB — GLUCOSE, CAPILLARY
Glucose-Capillary: 99 mg/dL (ref 70–99)
Glucose-Capillary: 112 mg/dL — ABNORMAL HIGH (ref 70–99)
Glucose-Capillary: 86 mg/dL (ref 70–99)
Glucose-Capillary: 157 mg/dL — ABNORMAL HIGH (ref 70–99)

## 2020-10-27 LAB — CBC
HCT: 33.6 % — ABNORMAL LOW (ref 39.0–52.0)
Hemoglobin: 10.8 g/dL — ABNORMAL LOW (ref 13.0–17.0)
MCH: 28.2 pg (ref 26.0–34.0)
MCHC: 32.1 g/dL (ref 30.0–36.0)
MCV: 87.7 fL (ref 80.0–100.0)
Platelets: 498 10*3/uL — ABNORMAL HIGH (ref 150–400)
RBC: 3.83 MIL/uL — ABNORMAL LOW (ref 4.22–5.81)
RDW: 12.9 % (ref 11.5–15.5)
WBC: 15 10*3/uL — ABNORMAL HIGH (ref 4.0–10.5)
nRBC: 0 % (ref 0.0–0.2)

## 2020-10-27 LAB — BLOOD GAS, ARTERIAL
Acid-Base Excess: 1 mmol/L (ref 0.0–2.0)
Bicarbonate: 25 mmol/L (ref 20.0–28.0)
FIO2: 24
O2 Saturation: 96.8 %
Patient temperature: 37
pCO2 arterial: 40 mmHg (ref 32.0–48.0)
pH, Arterial: 7.413 (ref 7.350–7.450)
pO2, Arterial: 101 mmHg (ref 83.0–108.0)

## 2020-10-27 MED ORDER — FENTANYL 50 MCG/ML IV PCA SOLN
INTRAVENOUS | Status: DC
  Administered 2020-10-27: 135 ug via INTRAVENOUS

## 2020-10-27 NOTE — Progress Notes (Signed)
Progress Note    [name] [name]  JKD:[id number] DOB: 10-18-1956  DOA: 10/25/2020 PCP: Janith Lima, MD    Brief Narrative:     Medical records reviewed and are as summarized below:  [name] [name] is an 64 y.o. male with medical history significant of hypertension, hyperlipidemia, diet-controlled diabetes mellitus type 2,  S/p gastric band, and GERD presents with with complaints of having pneumonia.   He was found to have a left chest empyema and underwent drainage and VATS on 12/17  Assessment/Plan:   Principal Problem:   Empyema ([place]) Active Problems:   LAP-BAND surgery status   Normocytic anemia   AAA (abdominal aortic aneurysm) (HCC)   Leukocytosis   Empyema of pleural space (HCC)   Left chest empyema: Acute.   -Status post VATS on 12/17; with insertion of chest tube x3 -Cultures pending but showing gram-positive cocci -CVTS consult appreciated -Continue IV antibiotics, de-escalate as able -Ambulate -Chest x-ray ordered for the a.m. -Pain control per CVTS  Leukocytosis/SIRS: Acute.   -Trend  AAA:   Patient with 4.2 cm ascending aortic aneurysm noted on CTA of the chest. -Patient will need annual monitoring   Normocytic anemia:  -Daily labs to monitor  Status post lap-band History of diabetes and hypertension:  He has not been on any medications for blood pressure or diabetes since gastric lap band and weight loss    Family Communication/Anticipated D/C date and plan/Code Status   DVT prophylaxis: Per CVTS Code Status: Full Code.  Disposition Plan: Status is: Inpatient  Remains inpatient appropriate because:Inpatient level of care appropriate due to severity of illness   Dispo: The patient is from: Home              Anticipated d/c is to: Home              Anticipated d/c date is: 3 days              Patient currently is not medically stable to d/c.          Subjective:   Patient states pain is much better than when he presented  to the hospital  Objective:    Vitals:   10/27/20 0006 10/27/20 0300 10/27/20 0313 10/27/20 0558  BP:  121/73    Pulse:  61  67  Resp: 16 10 17 15   Temp:  98.2 F (36.8 C)    TempSrc:  Oral    SpO2: 96% 95% 96% 97%    Intake/Output Summary (Last 24 hours) at 10/27/2020 0946 Last data filed at 10/27/2020 0556 Gross per 24 hour  Intake 2947.82 ml  Output 2260 ml  Net 687.82 ml   There were no vitals filed for this visit.  Exam:  General: Appearance:     Overweight male in no acute distress     Lungs:     respirations unlabored, diminished LLL  Heart:    Normal heart rate. Normal rhythm. No murmurs, rubs, or gallops.   MS:   All extremities are intact.   Neurologic:   Awake, alert, oriented x 3. No apparent focal neurological           defect.     Data Reviewed:   I have personally reviewed following labs and imaging studies:  Labs: Labs show the following:   Basic Metabolic Panel: Recent Labs  Lab 10/25/20 1008 10/25/20 2151 10/25/20 2239 10/26/20 0216 10/27/20 0056  NA 136 133* 136 135 136  K 4.2 4.3  4.2 4.2 4.4  CL 97* 97*  --  105 102  CO2 25 24  --  21* 24  GLUCOSE 115* 157*  --  89 113*  BUN 13 14  --  14 17  CREATININE 0.78 0.68  --  0.66 0.68  CALCIUM 8.8* 8.4*  --  8.0* 8.6*   GFR Estimated Creatinine Clearance: 107 mL/min (by C-G formula based on SCr of 0.68 mg/dL). Liver Function Tests: Recent Labs  Lab 10/25/20 2151  AST 16  ALT 22  ALKPHOS 74  BILITOT 0.4  PROT 6.7  ALBUMIN 2.1*   No results for input(s): LIPASE, AMYLASE in the last 168 hours. No results for input(s): AMMONIA in the last 168 hours. Coagulation profile Recent Labs  Lab 10/25/20 2151  INR 1.3*    CBC: Recent Labs  Lab 10/25/20 1008 10/25/20 2239 10/26/20 0216 10/27/20 0056  WBC 11.2*  --  9.9 15.0*  HGB 11.3* 10.5* 10.2* 10.8*  HCT 35.2* 31.0* 32.2* 33.6*  MCV 88.7  --  91.5 87.7  PLT 305  --  181 498*   Cardiac Enzymes: No results for input(s):  CKTOTAL, CKMB, CKMBINDEX, TROPONINI in the last 168 hours. BNP (last 3 results) No results for input(s): PROBNP in the last 8760 hours. CBG: Recent Labs  Lab 10/26/20 1601 10/26/20 2045 10/26/20 2346 10/27/20 0335 10/27/20 0825  GLUCAP 135* 114* 100* 99 112*   D-Dimer: No results for input(s): DDIMER in the last 72 hours. Hgb A1c: No results for input(s): HGBA1C in the last 72 hours. Lipid Profile: No results for input(s): CHOL, HDL, LDLCALC, TRIG, CHOLHDL, LDLDIRECT in the last 72 hours. Thyroid function studies: No results for input(s): TSH, T4TOTAL, T3FREE, THYROIDAB in the last 72 hours.  Invalid input(s): FREET3 Anemia work up: No results for input(s): VITAMINB12, FOLATE, FERRITIN, TIBC, IRON, RETICCTPCT in the last 72 hours. Sepsis Labs: Recent Labs  Lab 10/25/20 1008 10/25/20 2151 10/26/20 0216 10/27/20 0056  WBC 11.2*  --  9.9 15.0*  LATICACIDVEN  --  1.3  --   --     Microbiology Recent Results (from the past 240 hour(s))  Resp Panel by RT-PCR (Flu A&B, Covid) Nasopharyngeal Swab     Status: None   Collection Time: 10/18/20  8:40 AM   Specimen: Nasopharyngeal Swab; Nasopharyngeal(NP) swabs in vial transport medium  Result Value Ref Range Status   SARS Coronavirus 2 by RT PCR NEGATIVE NEGATIVE Final    Comment: (NOTE) SARS-CoV-2 target nucleic acids are NOT DETECTED.  The SARS-CoV-2 RNA is generally detectable in upper respiratory specimens during the acute phase of infection. The lowest concentration of SARS-CoV-2 viral copies this assay can detect is 138 copies/mL. A negative result does not preclude SARS-Cov-2 infection and should not be used as the sole basis for treatment or other patient management decisions. A negative result may occur with  improper specimen collection/handling, submission of specimen other than nasopharyngeal swab, presence of viral mutation(s) within the areas targeted by this assay, and inadequate number of viral copies(<138  copies/mL). A negative result must be combined with clinical observations, patient history, and epidemiological information. The expected result is Negative.  Fact Sheet for Patients:  EntrepreneurPulse.com.au  Fact Sheet for Healthcare Providers:  IncredibleEmployment.be  This test is no t yet approved or cleared by the [place] FDA and  has been authorized for detection and/or diagnosis of SARS-CoV-2 by FDA under an Emergency Use Authorization (EUA). This EUA will remain  in effect (meaning this test can  be used) for the duration of the COVID-19 declaration under Section 564(b)(1) of the Act, 21 U.S.C.section 360bbb-3(b)(1), unless the authorization is terminated  or revoked sooner.       Influenza A by PCR NEGATIVE NEGATIVE Final   Influenza B by PCR NEGATIVE NEGATIVE Final    Comment: (NOTE) The Xpert Xpress SARS-CoV-2/FLU/RSV plus assay is intended as an aid in the diagnosis of influenza from Nasopharyngeal swab specimens and should not be used as a sole basis for treatment. Nasal washings and aspirates are unacceptable for Xpert Xpress SARS-CoV-2/FLU/RSV testing.  Fact Sheet for Patients: EntrepreneurPulse.com.au  Fact Sheet for Healthcare Providers: IncredibleEmployment.be  This test is not yet approved or cleared by the [place] FDA and has been authorized for detection and/or diagnosis of SARS-CoV-2 by FDA under an Emergency Use Authorization (EUA). This EUA will remain in effect (meaning this test can be used) for the duration of the COVID-19 declaration under Section 564(b)(1) of the Act, 21 U.S.C. section 360bbb-3(b)(1), unless the authorization is terminated or revoked.  Performed at [facility], [address] [place], [place] [address]   Culture, blood (routine x 2)     Status: None (Preliminary result)   Collection Time: 10/25/20 12:48 PM   Specimen: BLOOD   Result Value Ref Range Status   Specimen Description BLOOD BLOOD LEFT FOREARM  Final   Special Requests   Final    BOTTLES DRAWN AEROBIC AND ANAEROBIC Blood Culture adequate volume   Culture   Final    NO GROWTH < 24 HOURS Performed at [facility], [place] [address], [place], [place] [address]    Report Status PENDING  Incomplete  Culture, blood (routine x 2)     Status: None (Preliminary result)   Collection Time: 10/25/20 12:53 PM   Specimen: BLOOD  Result Value Ref Range Status   Specimen Description BLOOD RIGHT ANTECUBITAL  Final   Special Requests   Final    BOTTLES DRAWN AEROBIC AND ANAEROBIC Blood Culture adequate volume   Culture   Final    NO GROWTH < 24 HOURS Performed at [facility], [place] [address], [place], [place] [address]    Report Status PENDING  Incomplete  Resp Panel by RT-PCR (Flu A&B, Covid) Nasopharyngeal Swab     Status: None   Collection Time: 10/25/20  1:12 PM   Specimen: Nasopharyngeal Swab; Nasopharyngeal(NP) swabs in vial transport medium  Result Value Ref Range Status   SARS Coronavirus 2 by RT PCR NEGATIVE NEGATIVE Final    Comment: (NOTE) SARS-CoV-2 target nucleic acids are NOT DETECTED.  The SARS-CoV-2 RNA is generally detectable in upper respiratory specimens during the acute phase of infection. The lowest concentration of SARS-CoV-2 viral copies this assay can detect is 138 copies/mL. A negative result does not preclude SARS-Cov-2 infection and should not be used as the sole basis for treatment or other patient management decisions. A negative result may occur with  improper specimen collection/handling, submission of specimen other than nasopharyngeal swab, presence of viral mutation(s) within the areas targeted by this assay, and inadequate number of viral copies(<138 copies/mL). A negative result must be combined with clinical observations, patient history, and epidemiological information. The expected result is  Negative.  Fact Sheet for Patients:  EntrepreneurPulse.com.au  Fact Sheet for Healthcare Providers:  IncredibleEmployment.be  This test is no t yet approved or cleared by the [place] FDA and  has been authorized for detection and/or diagnosis of SARS-CoV-2 by FDA  under an Emergency Use Authorization (EUA). This EUA will remain  in effect (meaning this test can be used) for the duration of the COVID-19 declaration under Section 564(b)(1) of the Act, 21 U.S.C.section 360bbb-3(b)(1), unless the authorization is terminated  or revoked sooner.       Influenza A by PCR NEGATIVE NEGATIVE Final   Influenza B by PCR NEGATIVE NEGATIVE Final    Comment: (NOTE) The Xpert Xpress SARS-CoV-2/FLU/RSV plus assay is intended as an aid in the diagnosis of influenza from Nasopharyngeal swab specimens and should not be used as a sole basis for treatment. Nasal washings and aspirates are unacceptable for Xpert Xpress SARS-CoV-2/FLU/RSV testing.  Fact Sheet for Patients: EntrepreneurPulse.com.au  Fact Sheet for Healthcare Providers: IncredibleEmployment.be  This test is not yet approved or cleared by the [place] FDA and has been authorized for detection and/or diagnosis of SARS-CoV-2 by FDA under an Emergency Use Authorization (EUA). This EUA will remain in effect (meaning this test can be used) for the duration of the COVID-19 declaration under Section 564(b)(1) of the Act, 21 U.S.C. section 360bbb-3(b)(1), unless the authorization is terminated or revoked.  Performed at [facility], [place] [address] [place], [place] [address]   Body fluid culture     Status: None (Preliminary result)   Collection Time: 10/26/20  8:31 AM   Specimen: PATH Other; Body Fluid  Result Value Ref Range Status   Specimen Description PLEURAL  Final   Special Requests A LEFT PLEURAL  Final   Gram Stain   Final    ABUNDANT WBC  PRESENT,BOTH PMN AND MONONUCLEAR MODERATE GRAM POSITIVE COCCI Performed at [facility], [place] [address] [place], [place] [address]    Culture PENDING  Incomplete   Report Status PENDING  Incomplete  Acid Fast Smear (AFB)     Status: None   Collection Time: 10/26/20  8:31 AM   Specimen: PATH Other; Body Fluid  Result Value Ref Range Status   AFB Specimen Processing Concentration  Final   Acid Fast Smear Negative  Final    Comment: (NOTE) Performed At: [facility] [place], [place] [id number] Rush Farmer MD EH:[id number]    Source (AFB) PLEURAL  Final    Comment: Performed at [facility], [place] [address], [place], [place] [address]  Culture, fungus without smear     Status: None (Preliminary result)   Collection Time: 10/26/20  8:48 AM   Specimen: PATH Other; Lung  Result Value Ref Range Status   Specimen Description PLEURAL  Final   Special Requests C LT PLEURAL  Final   Culture   Final    NO FUNGUS ISOLATED AFTER 1 DAY Performed at [facility], [place] [address], [place], [place] [address]    Report Status PENDING  Incomplete  Aerobic/Anaerobic Culture (surgical/deep wound)     Status: None (Preliminary result)   Collection Time: 10/26/20  8:48 AM   Specimen: PATH Other; Lung  Result Value Ref Range Status   Specimen Description PLEURAL  Final   Special Requests C LT PLEURAL  Final   Gram Stain   Final    ABUNDANT WBC PRESENT,BOTH PMN AND MONONUCLEAR RARE GRAM POSITIVE COCCI Performed at [facility], [place] [address] [place],  [address]    Culture PENDING  Incomplete   Report Status PENDING  Incomplete  Acid Fast Smear (AFB)     Status: None   Collection Time: 10/26/20  8:48 AM  Specimen: PATH Other; Lung  Result Value Ref Range Status   AFB Specimen Processing Concentration  Final   Acid Fast Smear Negative  Final    Comment: (NOTE) Performed At: [facility] [place], [place]  [id number] Rush Farmer MD EY:[id number]    Source (AFB) PLEURAL  Final    Comment: Performed at [facility], [place] [address] [place], [place] [address]    Procedures and diagnostic studies:  CT Chest W Contrast  Result Date: 10/25/2020 CLINICAL DATA:  Continued cough and low-grade fever since finishing antibiotics for pneumonia. EXAM: CT CHEST WITH CONTRAST TECHNIQUE: Multidetector CT imaging of the chest was performed during intravenous contrast administration. CONTRAST:  79mL OMNIPAQUE IOHEXOL 300 MG/ML  SOLN COMPARISON:  Chest x-ray from same day. CT chest dated September 25, 2011. FINDINGS: Cardiovascular: Normal heart size. No pericardial effusion. Aneurysmal dilatation of the ascending thoracic aorta measuring up to 4.2 cm. No dissection. Coronary, aortic arch, and branch vessel atherosclerotic vascular disease. No pulmonary embolism. Mediastinum/Nodes: Slight rightward mediastinal shift. No enlarged mediastinal, hilar, or axillary lymph nodes. Patulous esophagus. Thyroid gland and trachea demonstrate no significant findings. Lungs/Pleura: Large loculated gas and fluid collection in the left pleural space with pleural thickening and enhancement. The collection essentially fills the entire left hemithorax with severe mass effect on the left lung which is mostly collapsed. The right lung is clear. Upper Abdomen: No acute abnormality. Prior gastric lap band surgery. Musculoskeletal: No chest wall abnormality. No acute or significant osseous findings. Old left-sided rib fractures. IMPRESSION: 1. Large left-sided empyema essentially filling the entire left hemithorax with near complete collapse of the left lung. 2. 4.2 cm ascending aortic aneurysm. Recommend annual imaging followup by CTA or MRA. This recommendation follows 2010 ACCF/AHA/AATS/ACR/ASA/SCA/SCAI/SIR/STS/SVM Guidelines for the Diagnosis and Management of Patients with Thoracic Aortic Disease. Circulation. 2010; 121: Y637-C588.  Aortic aneurysm NOS (ICD10-I71.9) 3. Aortic Atherosclerosis (ICD10-I70.0). Electronically Signed   By: Titus Dubin M.D.   On: 10/25/2020 15:49   DG Chest Port 1 View  Result Date: 10/26/2020 CLINICAL DATA:  64 year old male with a history vats EXAM: PORTABLE CHEST 1 VIEW COMPARISON:  10/25/2020, CT chest 10/26/2019 FINDINGS: Cardiomediastinal silhouette within normal limits, with improved visualization of the left mediastinum and left heart border. Interval resolution of dense opacity of the left hemithorax. Reticulonodular pattern of opacity throughout the left lung persist. Similar appearance of coarsened interstitial markings on the right. Interval placement of 3 thoracostomy tubes, within the sub pulmonic and the left lateral pleural space. Interval placement of left subclavian central venous catheter, with the tip appearing to terminate in the SVC. No pneumothorax. IMPRESSION: Interval surgical changes of VATS and 3 left-sided thoracostomy tubes for treatment of left pleural disease with no visualized pneumothorax and interval resolution of the left pleural fluid. Reticulonodular opacities of the left lung, potentially combination of chronic and acute change. Chronic changes of the right lung. Interval placement of left subclavian central venous catheter with the tip appearing to terminate SVC. Electronically Signed   By: Corrie Mckusick D.O.   On: 10/26/2020 11:17    Medications:   . acetaminophen  1,000 mg Oral Q6H   Or  . acetaminophen (TYLENOL) oral liquid 160 mg/5 mL  1,000 mg Oral Q6H  . bisacodyl  10 mg Oral Daily  . Chlorhexidine Gluconate Cloth  6 each Topical Daily  . fentaNYL   Intravenous Q4H  . insulin aspart  0-24 Units Subcutaneous Q4H  . senna-docusate  1 tablet Oral  QHS   Continuous Infusions: . sodium chloride    . sodium chloride 100 mL/hr at 10/26/20 1300  . bupivacaine 0.5 % ON-Q pump SINGLE CATH 400 mL    . ceFEPime (MAXIPIME) IV Stopped (10/27/20 0246)  .  vancomycin Stopped (10/26/20 2340)     LOS: 2 days   Geradine Girt  [facility]   How to contact the [facility] Attending or Consulting provider [place] or covering provider during after hours [place], for this patient?  1. Check the care team in [facility] and look for a) attending/consulting TR[facility] provider listed and b) the [facility] team listed 2. Log into [email or web address] and use [place]'s universal password to access. If you do not have the password, please contact the hospital operator. 3. Locate the [facility] provider you are looking for under [facility] and page to a number that you can be directly reached. 4. If you still have difficulty reaching the provider, please page the [facility] (Director on Call) for the Hospitalists listed on amion for assistance.  10/27/2020, 9:46 AM

## 2020-10-27 NOTE — Progress Notes (Signed)
Patient ID: [name] [name], male   DOB: [birth year], 64 y.o.   MRN: [id number] TCTS DAILY ICU PROGRESS NOTE                   [address]            [place] [address]          [phone]   1 Day Post-Op Procedure(s) (LRB): VIDEO ASSISTED THORACOSCOPY (VATS)/EMPYEMA ,INSERTION OF LEFT PLEURAL CHEST TUBES X 3 (Left) DECORTICATION OF LEFT LUNG, APPLICATION OF ON Q-PUMP FOR PAIN MANAGEMENT (Left)  Total Length of Stay:  LOS: 2 days   Subjective: Patient feels better this morning, decrease pain  Objective: Vital signs in last 24 hours: Temp:  [97.2 F (36.2 C)-98.3 F (36.8 C)] 98.2 F (36.8 C) (12/18 0300) Pulse Rate:  [60-90] 67 (12/18 0558) Cardiac Rhythm: Normal sinus rhythm (12/18 0701) Resp:  [10-25] 15 (12/18 0558) BP: (121-189)/(72-113) 121/73 (12/18 0300) SpO2:  [92 %-97 %] 97 % (12/18 0558) Arterial Line BP: (210-235)/(93-101) 235/101 (12/17 1215)  There were no vitals filed for this visit.  Weight change:    Hemodynamic parameters for last 24 hours:    Intake/Output from previous day: 12/17 0701 - 12/18 0700 In: 2947.8 [P.O.:720; I.V.:1000; IV Piggyback:977.8] Out: 6560 [Urine:1550; Blood:50; Chest Tube:460]  Intake/Output this shift: No intake/output data recorded.  Current Meds: Scheduled Meds: . acetaminophen  1,000 mg Oral Q6H   Or  . acetaminophen (TYLENOL) oral liquid 160 mg/5 mL  1,000 mg Oral Q6H  . bisacodyl  10 mg Oral Daily  . Chlorhexidine Gluconate Cloth  6 each Topical Daily  . fentaNYL   Intravenous Q4H  . insulin aspart  0-24 Units Subcutaneous Q4H  . senna-docusate  1 tablet Oral QHS   Continuous Infusions: . sodium chloride    . sodium chloride 100 mL/hr at 10/26/20 1300  . bupivacaine 0.5 % ON-Q pump SINGLE CATH 400 mL    . ceFEPime (MAXIPIME) IV Stopped (10/27/20 0246)  . vancomycin Stopped (10/26/20 2340)   PRN Meds:.Place/Maintain arterial line **AND** sodium chloride, diphenhydrAMINE **OR** diphenhydrAMINE,  naloxone **AND** sodium chloride flush, ondansetron (ZOFRAN) IV, traMADol  General appearance: alert, cooperative and no distress Neurologic: intact Heart: regular rate and rhythm, S1, S2 normal, no murmur, click, rub or gallop Lungs: diminished breath sounds LLL Abdomen: soft, non-tender; bowel sounds normal; no masses,  no organomegaly Extremities: extremities normal, atraumatic, no cyanosis or edema and Homans sign is negative, no sign of DVT Wound: Chest tubes in place  Lab Results: CBC: Recent Labs    10/26/20 0216 10/27/20 0056  WBC 9.9 15.0*  HGB 10.2* 10.8*  HCT 32.2* 33.6*  PLT 181 498*   BMET:  Recent Labs    10/26/20 0216 10/27/20 0056  NA 135 136  K 4.2 4.4  CL 105 102  CO2 21* 24  GLUCOSE 89 113*  BUN 14 17  CREATININE 0.66 0.68  CALCIUM 8.0* 8.6*    CMET: Lab Results  Component Value Date   WBC 15.0 (H) 10/27/2020   HGB 10.8 (L) 10/27/2020   HCT 33.6 (L) 10/27/2020   PLT 498 (H) 10/27/2020   GLUCOSE 113 (H) 10/27/2020   CHOL 168 02/24/2020   TRIG 135.0 02/24/2020   HDL 48.70 02/24/2020   LDLDIRECT 118.0 05/14/2018   LDLCALC 92 02/24/2020   ALT 22 10/25/2020   AST 16 10/25/2020   NA 136 10/27/2020   K 4.4 10/27/2020   CL 102 10/27/2020  CREATININE 0.68 10/27/2020   BUN 17 10/27/2020   CO2 24 10/27/2020   TSH 2.62 02/24/2020   PSA 0.39 02/24/2020   INR 1.3 (H) 10/25/2020   HGBA1C 5.7 02/24/2020   MICROALBUR 4.2 (H) 12/30/2007    Results for orders placed or performed during the hospital encounter of 10/25/20  Culture, blood (routine x 2)     Status: None (Preliminary result)   Collection Time: 10/25/20 12:48 PM   Specimen: BLOOD  Result Value Ref Range Status   Specimen Description BLOOD BLOOD LEFT FOREARM  Final   Special Requests   Final    BOTTLES DRAWN AEROBIC AND ANAEROBIC Blood Culture adequate volume   Culture   Final    NO GROWTH < 24 HOURS Performed at [facility], [place] [address] [place], [place] [address]     Report Status PENDING  Incomplete  Culture, blood (routine x 2)     Status: None (Preliminary result)   Collection Time: 10/25/20 12:53 PM   Specimen: BLOOD  Result Value Ref Range Status   Specimen Description BLOOD RIGHT ANTECUBITAL  Final   Special Requests   Final    BOTTLES DRAWN AEROBIC AND ANAEROBIC Blood Culture adequate volume   Culture   Final    NO GROWTH < 24 HOURS Performed at [facility], [place] [address], [place], [place] [address]    Report Status PENDING  Incomplete  Resp Panel by RT-PCR (Flu A&B, Covid) Nasopharyngeal Swab     Status: None   Collection Time: 10/25/20  1:12 PM   Specimen: Nasopharyngeal Swab; Nasopharyngeal(NP) swabs in vial transport medium  Result Value Ref Range Status   SARS Coronavirus 2 by RT PCR NEGATIVE NEGATIVE Final    Comment: (NOTE) SARS-CoV-2 target nucleic acids are NOT DETECTED.  The SARS-CoV-2 RNA is generally detectable in upper respiratory specimens during the acute phase of infection. The lowest concentration of SARS-CoV-2 viral copies this assay can detect is 138 copies/mL. A negative result does not preclude SARS-Cov-2 infection and should not be used as the sole basis for treatment or other patient management decisions. A negative result may occur with  improper specimen collection/handling, submission of specimen other than nasopharyngeal swab, presence of viral mutation(s) within the areas targeted by this assay, and inadequate number of viral copies(<138 copies/mL). A negative result must be combined with clinical observations, patient history, and epidemiological information. The expected result is Negative.  Fact Sheet for Patients:  EntrepreneurPulse.com.au  Fact Sheet for Healthcare Providers:  IncredibleEmployment.be  This test is no t yet approved or cleared by the [place] FDA and  has been authorized for detection and/or diagnosis of SARS-CoV-2 by FDA under an  Emergency Use Authorization (EUA). This EUA will remain  in effect (meaning this test can be used) for the duration of the COVID-19 declaration under Section 564(b)(1) of the Act, 21 U.S.C.section 360bbb-3(b)(1), unless the authorization is terminated  or revoked sooner.       Influenza A by PCR NEGATIVE NEGATIVE Final   Influenza B by PCR NEGATIVE NEGATIVE Final    Comment: (NOTE) The Xpert Xpress SARS-CoV-2/FLU/RSV plus assay is intended as an aid in the diagnosis of influenza from Nasopharyngeal swab specimens and should not be used as a sole basis for treatment. Nasal washings and aspirates are unacceptable for Xpert Xpress SARS-CoV-2/FLU/RSV testing.  Fact Sheet for Patients: EntrepreneurPulse.com.au  Fact Sheet for Healthcare Providers: IncredibleEmployment.be  This test is not yet approved or cleared by the [place]  States FDA and has been authorized for detection and/or diagnosis of SARS-CoV-2 by FDA under an Emergency Use Authorization (EUA). This EUA will remain in effect (meaning this test can be used) for the duration of the COVID-19 declaration under Section 564(b)(1) of the Act, 21 U.S.C. section 360bbb-3(b)(1), unless the authorization is terminated or revoked.  Performed at [facility], [place] [address] [place], [place] [address]   Body fluid culture     Status: None (Preliminary result)   Collection Time: 10/26/20  8:31 AM   Specimen: PATH Other; Body Fluid  Result Value Ref Range Status   Specimen Description PLEURAL  Final   Special Requests A LEFT PLEURAL  Final   Gram Stain   Final    ABUNDANT WBC PRESENT,BOTH PMN AND MONONUCLEAR MODERATE GRAM POSITIVE COCCI Performed at [facility], [place] [address], [place], [place] [address]    Culture PENDING  Incomplete   Report Status PENDING  Incomplete  Acid Fast Smear (AFB)     Status: None   Collection Time: 10/26/20  8:31 AM   Specimen: PATH Other; Body Fluid   Result Value Ref Range Status   AFB Specimen Processing Concentration  Final   Acid Fast Smear Negative  Final    Comment: (NOTE) Performed At: [facility] [place], [place] [id number] Rush Farmer MD [id number]    Source (AFB) PLEURAL  Final    Comment: Performed at [facility], [place] [address] [place], [place] [address]  Culture, fungus without smear     Status: None (Preliminary result)   Collection Time: 10/26/20  8:48 AM   Specimen: PATH Other; Lung  Result Value Ref Range Status   Specimen Description PLEURAL  Final   Special Requests C LT PLEURAL  Final   Culture   Final    NO FUNGUS ISOLATED AFTER 1 DAY Performed at [facility], [place] [address] [place], [place] [address]    Report Status PENDING  Incomplete  Aerobic/Anaerobic Culture (surgical/deep wound)     Status: None (Preliminary result)   Collection Time: 10/26/20  8:48 AM   Specimen: PATH Other; Lung  Result Value Ref Range Status   Specimen Description PLEURAL  Final   Special Requests C LT PLEURAL  Final   Gram Stain   Final    ABUNDANT WBC PRESENT,BOTH PMN AND MONONUCLEAR RARE GRAM POSITIVE COCCI Performed at [facility], [place] [address], [place], [place] [address]    Culture PENDING  Incomplete   Report Status PENDING  Incomplete  Acid Fast Smear (AFB)     Status: None   Collection Time: 10/26/20  8:48 AM   Specimen: PATH Other; Lung  Result Value Ref Range Status   AFB Specimen Processing Concentration  Final   Acid Fast Smear Negative  Final    Comment: (NOTE) Performed At: [facility] [place], [place] [id number] Rush Farmer MD [id number]    Source (AFB) PLEURAL  Final    Comment: Performed at [facility], [place] [address], [place],  [address]      PT/INR:  Recent Labs    10/25/20 2151  LABPROT 15.9*  INR 1.3*   Radiology: [facility] Chest Port 1 View  Result Date: 10/26/2020 CLINICAL DATA:  65 year old  male with a history vats EXAM: PORTABLE CHEST 1 VIEW COMPARISON:  10/25/2020, CT chest 10/26/2019 FINDINGS: Cardiomediastinal silhouette within normal limits, with improved visualization of the left mediastinum  and left heart border. Interval resolution of dense opacity of the left hemithorax. Reticulonodular pattern of opacity throughout the left lung persist. Similar appearance of coarsened interstitial markings on the right. Interval placement of 3 thoracostomy tubes, within the sub pulmonic and the left lateral pleural space. Interval placement of left subclavian central venous catheter, with the tip appearing to terminate in the SVC. No pneumothorax. IMPRESSION: Interval surgical changes of VATS and 3 left-sided thoracostomy tubes for treatment of left pleural disease with no visualized pneumothorax and interval resolution of the left pleural fluid. Reticulonodular opacities of the left lung, potentially combination of chronic and acute change. Chronic changes of the right lung. Interval placement of left subclavian central venous catheter with the tip appearing to terminate SVC. Electronically Signed   By: Corrie Mckusick D.O.   On: 10/26/2020 11:17    Assessment/Plan: S/P Procedure(s) (LRB): VIDEO ASSISTED THORACOSCOPY (VATS)/EMPYEMA ,INSERTION OF LEFT PLEURAL CHEST TUBES X 3 (Left) DECORTICATION OF LEFT LUNG, APPLICATION OF ON Q-PUMP FOR PAIN MANAGEMENT (Left) Mobilize BUNDANT WBC PRESENT,BOTH PMN AND MONONUCLEAR RARE GRAM POSITIVE COCCI Blood cultures negative Plan DC Foley D/c pca with  On Q  out last night Leave chest tubes   Grace Isaac 10/27/2020 9:14 AM

## 2020-10-27 NOTE — Op Note (Signed)
NAME: [name] [name], [name] [name]. MEDICAL RECORD [id number] ACCOUNT [id number] DATE OF BIRTH:[birth year] FACILITY: [facility] LOCATION: [facility] PHYSICIAN:Shatoya Roets VAN TRIGT III, MD  OPERATIVE REPORT  DATE OF PROCEDURE:  10/26/2020  OPERATION: 1.  Left VATS for drainage of large empyema and decortication of left lung. 2.  Placement of On-Q wound analgesia system.  SURGEON:  Ivin Poot, MD  ASSISTANT:  Nicholes Rough, PA-C.  PREOPERATIVE DIAGNOSES:  Left lower lobe pneumonia, followed by a large left empyema with atelectasis of the left lung.  POSTOPERATIVE DIAGNOSES:  Left lower lobe pneumonia, followed by a large left empyema with atelectasis of the left lung.  ANESTHESIA:  General.  CLINICAL NOTE:  The patient is a 64 year old nonsmoker, who developed left lower lobe pneumonia, was seen in an outpatient setting for chest x-ray and received a course of oral azithromycin.  His symptoms progressed to pleuritic pain, shortness of  breath, weakness and malaise with poor appetite.  He presented to his primary care office and a followup chest x-ray showed complete opacification of the left hemithorax and he was sent to the ED.  A CT scan was performed showing a large loculated  empyema with almost total atelectasis of the left lung.  He was admitted to the hospital for thoracic surgical therapy of the empyema and continued IV antibiotics.  He was not toxic or with systemic signs of sepsis.  I discussed the procedure of left VATS for drainage of empyema and decortication of the lung with the patient.  He understood the details of the surgery, the expected postoperative hospital recovery and the risks of bleeding, transfusion, dysfunction of  the reexpanded left lung, organ failure, death.  He agreed to proceed with surgery under what I felt was an informed consent.  OPERATIVE FINDINGS: 1.  Four liters of dark milky fluid from the left hemithorax, fluid sent for cultures and cytology. 2.  Purulent fibrinous  exudate on the visceral and parietal pleural surfaces, which were cleaned off and sent for culture and pathology. 3.  Peel on the left lung of fibrotic scar-like material, which was removed to allow reexpansion of the left upper and lower lobes.  DESCRIPTION OF PROCEDURE:  The patient was brought from preoperative holding where informed consent was documented, the proper site marked and final issues addressed with the patient.  He was placed supine on the operating table and general anesthesia  was induced.  A double lumen endotracheal tube was placed by the anesthesia team.  The patient then was rolled with left side up.  The left chest was prepped and draped as a sterile field.  A proper time-out was performed.  A small incision was made anterior to the tip of the left scapula in the 5th interspace.  The pleural space was entered.  Copious amounts (almost 4 liters) of dark purulent fluid was suctioned out and sent for the tests previously noted.  After complete  evacuation of the fluid, the thorax was inspected with a camera placed through a port and using a 5 mm camera.  There was large amount of fibrinous exudate on both parietal and visceral pleural surfaces of the chest wall and lung.  Over the next  significant period of time, we tediously removed the fibrinous exudate to decorticate the lung.  In parts of the areas, the exudate was extremely adherent and care was being taken to avoid injury to the underlying lung parenchyma.  After the solid  material was removed, the entire hemothorax was irrigated with over a  liter of vancomycin irrigation.  Next, we placed 3 chest tubes in the anterior, posterior and thick area of the costophrenic angle and brought out through separate incisions.  The small retractor was removed and the left lung was reexpanded under direct vision with good reexpansion.  The incision was closed using 2 pericostal sutures of #1 Vicryl.  The muscle layers were closed in layers  using interrupted #1 Vicryl.  Subcutaneous and skin layers were closed with a running Vicryl.  An On-Q catheter was placed between the incision and  the chest tube sites and the muscular chest wall to provide 0.5% Marcaine irrigation to reduce pain.  The patient was then rolled supine and observed and then extubated by anesthesia and returned to recovery room.  IN/NUANCE  D:10/26/2020 T:10/27/2020 JOB:[id number]

## 2020-10-27 NOTE — Plan of Care (Signed)

## 2020-10-28 ENCOUNTER — Inpatient Hospital Stay (HOSPITAL_COMMUNITY): Payer: PPO

## 2020-10-28 LAB — GLUCOSE, CAPILLARY
Glucose-Capillary: 125 mg/dL — ABNORMAL HIGH (ref 70–99)
Glucose-Capillary: 76 mg/dL (ref 70–99)
Glucose-Capillary: 79 mg/dL (ref 70–99)
Glucose-Capillary: 81 mg/dL (ref 70–99)
Glucose-Capillary: 89 mg/dL (ref 70–99)
Glucose-Capillary: 94 mg/dL (ref 70–99)
Glucose-Capillary: 95 mg/dL (ref 70–99)

## 2020-10-28 LAB — CBC
HCT: 33 % — ABNORMAL LOW (ref 39.0–52.0)
Hemoglobin: 10.2 g/dL — ABNORMAL LOW (ref 13.0–17.0)
MCH: 27.7 pg (ref 26.0–34.0)
MCHC: 30.9 g/dL (ref 30.0–36.0)
MCV: 89.7 fL (ref 80.0–100.0)
Platelets: 533 10*3/uL — ABNORMAL HIGH (ref 150–400)
RBC: 3.68 MIL/uL — ABNORMAL LOW (ref 4.22–5.81)
RDW: 12.8 % (ref 11.5–15.5)
WBC: 15.5 10*3/uL — ABNORMAL HIGH (ref 4.0–10.5)
nRBC: 0 % (ref 0.0–0.2)

## 2020-10-28 LAB — COMPREHENSIVE METABOLIC PANEL
ALT: 37 U/L (ref 0–44)
AST: 43 U/L — ABNORMAL HIGH (ref 15–41)
Albumin: 1.8 g/dL — ABNORMAL LOW (ref 3.5–5.0)
Alkaline Phosphatase: 70 U/L (ref 38–126)
Anion gap: 9 (ref 5–15)
BUN: 17 mg/dL (ref 8–23)
CO2: 27 mmol/L (ref 22–32)
Calcium: 8.1 mg/dL — ABNORMAL LOW (ref 8.9–10.3)
Chloride: 100 mmol/L (ref 98–111)
Creatinine, Ser: 0.72 mg/dL (ref 0.61–1.24)
GFR, Estimated: >60 mL/min (ref >60)
Glucose, Bld: 76 mg/dL (ref 70–99)
Potassium: 4.1 mmol/L (ref 3.5–5.1)
Sodium: 136 mmol/L (ref 135–145)
Total Bilirubin: 0.6 mg/dL (ref 0.3–1.2)
Total Protein: 5.5 g/dL — ABNORMAL LOW (ref 6.5–8.1)

## 2020-10-28 MED ORDER — TRAMADOL HCL 50 MG PO TABS: 50.0000 mg | ORAL_TABLET | ORAL | Status: DC | PRN

## 2020-10-28 MED ORDER — OXYCODONE HCL 5 MG PO TABS
5.0000 mg | ORAL_TABLET | ORAL | Status: DC | PRN
  Administered 2020-10-28 – 2020-11-01 (×16): 5 mg via ORAL
  Filled 2020-10-28 (×17): qty 1

## 2020-10-28 MED ORDER — TRAMADOL HCL 50 MG PO TABS
50.0000 mg | ORAL_TABLET | Freq: Four times a day (QID) | ORAL | Status: DC | PRN
  Administered 2020-10-28 – 2020-11-01 (×10): 100 mg via ORAL
  Filled 2020-10-28 (×11): qty 2

## 2020-10-28 MED ORDER — ENSURE ENLIVE PO LIQD
237.0000 mL | Freq: Three times a day (TID) | ORAL | Status: DC
  Administered 2020-10-29 – 2020-10-31 (×5): 237 mL via ORAL

## 2020-10-28 NOTE — Progress Notes (Signed)
Progress Note    [name] [name]  YYT:[id number] DOB: [birth year]  DOA: 10/25/2020 PCP: Janith [place], MD    Brief Narrative:     Medical records reviewed and are as summarized below:  [name] [name] is an 63 y.o. male with medical history significant of hypertension, hyperlipidemia, diet-controlled diabetes mellitus type 2,  S/p gastric band, and GERD presents with with complaints of having pneumonia.   He was found to have a left chest empyema and underwent drainage and VATS on 12/17  Assessment/Plan:   Principal Problem:   Empyema ([place]) Active Problems:   LAP-BAND surgery status   Normocytic anemia   AAA (abdominal aortic aneurysm) (HCC)   Leukocytosis   Empyema of pleural space (HCC)   Left chest empyema: Acute.   -Status post VATS on 12/17; with insertion of chest tube x3 -Cultures pending but showing gram-positive cocci -CVTS appreciated -Continue IV antibiotics, de-escalate as able -Ambulate -Pain control per CVTS  Leukocytosis/SIRS: Acute.   -Trend  AAA:   Patient with 4.2 cm ascending aortic aneurysm noted on CTA of the chest. -Patient will need annual monitoring   Normocytic anemia:  -Daily labs to monitor  Status post lap-band History of diabetes and hypertension:  He has not been on any medications for blood pressure or diabetes since gastric lap band and weight loss    Family Communication/Anticipated D/C date and plan/Code Status   DVT prophylaxis: Per CVTS Code Status: Full Code.  Disposition Plan: Status is: Inpatient  Remains inpatient appropriate because:Inpatient level of care appropriate due to severity of illness   Dispo: The patient is from: Home              Anticipated d/c is to: Home              Anticipated d/c date is: 3 days              Patient currently is not medically stable to d/c.          Subjective:   Up walking around the unit  Objective:    Vitals:   10/28/20 0611 10/28/20 0744 10/28/20 0800  10/28/20 1200  BP:  (!) 143/88    Pulse:  74  77  Resp: 17 20    Temp:  97.8 F (36.6 C)  98.7 F (37.1 C)  TempSrc:  Oral  Oral  SpO2:   98% 98%    Intake/Output Summary (Last 24 hours) at 10/28/2020 1307 Last data filed at 10/28/2020 1200 Gross per 24 hour  Intake 640 ml  Output 307 ml  Net 333 ml   There were no vitals filed for this visit.  Exam:  Up walking in the unit  Data Reviewed:   I have personally reviewed following labs and imaging studies:  Labs: Labs show the following:   Basic Metabolic Panel: Recent Labs  Lab 10/25/20 1008 10/25/20 2151 10/25/20 2239 10/26/20 0216 10/27/20 0056 10/28/20 0335  NA 136 133* 136 135 136 136  K 4.2 4.3 4.2 4.2 4.4 4.1  CL 97* 97*  --  105 102 100  CO2 25 24  --  21* 24 27  GLUCOSE 115* 157*  --  89 113* 76  BUN 13 14  --  14 17 17   CREATININE 0.78 0.68  --  0.66 0.68 0.72  CALCIUM 8.8* 8.4*  --  8.0* 8.6* 8.1*   GFR Estimated Creatinine Clearance: 107 mL/min (by C-G formula based on SCr of 0.72  mg/dL). Liver Function Tests: Recent Labs  Lab 10/25/20 2151 10/28/20 0335  AST 16 43*  ALT 22 37  ALKPHOS 74 70  BILITOT 0.4 0.6  PROT 6.7 5.5*  ALBUMIN 2.1* 1.8*   No results for input(s): LIPASE, AMYLASE in the last 168 hours. No results for input(s): AMMONIA in the last 168 hours. Coagulation profile Recent Labs  Lab 10/25/20 2151  INR 1.3*    CBC: Recent Labs  Lab 10/25/20 1008 10/25/20 2239 10/26/20 0216 10/27/20 0056 10/28/20 0335  WBC 11.2*  --  9.9 15.0* 15.5*  HGB 11.3* 10.5* 10.2* 10.8* 10.2*  HCT 35.2* 31.0* 32.2* 33.6* 33.0*  MCV 88.7  --  91.5 87.7 89.7  PLT 305  --  181 498* 533*   Cardiac Enzymes: No results for input(s): CKTOTAL, CKMB, CKMBINDEX, TROPONINI in the last 168 hours. BNP (last 3 results) No results for input(s): PROBNP in the last 8760 hours. CBG: Recent Labs  Lab 10/27/20 2052 10/28/20 0205 10/28/20 0514 10/28/20 0747 10/28/20 1107  GLUCAP 157* 95 76 94  89   D-Dimer: No results for input(s): DDIMER in the last 72 hours. Hgb A1c: No results for input(s): HGBA1C in the last 72 hours. Lipid Profile: No results for input(s): CHOL, HDL, LDLCALC, TRIG, CHOLHDL, LDLDIRECT in the last 72 hours. Thyroid function studies: No results for input(s): TSH, T4TOTAL, T3FREE, THYROIDAB in the last 72 hours.  Invalid input(s): FREET3 Anemia work up: No results for input(s): VITAMINB12, FOLATE, FERRITIN, TIBC, IRON, RETICCTPCT in the last 72 hours. Sepsis Labs: Recent Labs  Lab 10/25/20 1008 10/25/20 2151 10/26/20 0216 10/27/20 0056 10/28/20 0335  WBC 11.2*  --  9.9 15.0* 15.5*  LATICACIDVEN  --  1.3  --   --   --     Microbiology Recent Results (from the past 240 hour(s))  Culture, blood (routine x 2)     Status: None (Preliminary result)   Collection Time: 10/25/20 12:48 PM   Specimen: BLOOD  Result Value Ref Range Status   Specimen Description BLOOD BLOOD LEFT FOREARM  Final   Special Requests   Final    BOTTLES DRAWN AEROBIC AND ANAEROBIC Blood Culture adequate volume   Culture   Final    NO GROWTH 3 DAYS Performed at [facility], [place] [address] [place], [place] [address]    Report Status PENDING  Incomplete  Culture, blood (routine x 2)     Status: None (Preliminary result)   Collection Time: 10/25/20 12:53 PM   Specimen: BLOOD  Result Value Ref Range Status   Specimen Description BLOOD RIGHT ANTECUBITAL  Final   Special Requests   Final    BOTTLES DRAWN AEROBIC AND ANAEROBIC Blood Culture adequate volume   Culture   Final    NO GROWTH 3 DAYS Performed at [facility], [place] [address] [place], [place] [address]    Report Status PENDING  Incomplete  Resp Panel by RT-PCR (Flu A&B, Covid) Nasopharyngeal Swab     Status: None   Collection Time: 10/25/20  1:12 PM   Specimen: Nasopharyngeal Swab; Nasopharyngeal(NP) swabs in vial transport medium  Result Value Ref Range Status   SARS Coronavirus 2 by RT PCR NEGATIVE  NEGATIVE Final    Comment: (NOTE) SARS-CoV-2 target nucleic acids are NOT DETECTED.  The SARS-CoV-2 RNA is generally detectable in upper respiratory specimens during the acute phase of infection. The lowest concentration of SARS-CoV-2 viral copies this assay can detect is 138 copies/mL. A negative result  does not preclude SARS-Cov-2 infection and should not be used as the sole basis for treatment or other patient management decisions. A negative result may occur with  improper specimen collection/handling, submission of specimen other than nasopharyngeal swab, presence of viral mutation(s) within the areas targeted by this assay, and inadequate number of viral copies(<138 copies/mL). A negative result must be combined with clinical observations, patient history, and epidemiological information. The expected result is Negative.  Fact Sheet for Patients:  EntrepreneurPulse.com.au  Fact Sheet for Healthcare Providers:  IncredibleEmployment.be  This test is no t yet approved or cleared by the [place] FDA and  has been authorized for detection and/or diagnosis of SARS-CoV-2 by FDA under an Emergency Use Authorization (EUA). This EUA will remain  in effect (meaning this test can be used) for the duration of the COVID-19 declaration under Section 564(b)(1) of the Act, 21 U.S.C.section 360bbb-3(b)(1), unless the authorization is terminated  or revoked sooner.       Influenza A by PCR NEGATIVE NEGATIVE Final   Influenza B by PCR NEGATIVE NEGATIVE Final    Comment: (NOTE) The Xpert Xpress SARS-CoV-2/FLU/RSV plus assay is intended as an aid in the diagnosis of influenza from Nasopharyngeal swab specimens and should not be used as a sole basis for treatment. Nasal washings and aspirates are unacceptable for Xpert Xpress SARS-CoV-2/FLU/RSV testing.  Fact Sheet for Patients: EntrepreneurPulse.com.au  Fact Sheet for Healthcare  Providers: IncredibleEmployment.be  This test is not yet approved or cleared by the [place] FDA and has been authorized for detection and/or diagnosis of SARS-CoV-2 by FDA under an Emergency Use Authorization (EUA). This EUA will remain in effect (meaning this test can be used) for the duration of the COVID-19 declaration under Section 564(b)(1) of the Act, 21 U.S.C. section 360bbb-3(b)(1), unless the authorization is terminated or revoked.  Performed at [facility], [place] [address] [place], [place] [address]   Body fluid culture     Status: None (Preliminary result)   Collection Time: 10/26/20  8:31 AM   Specimen: PATH Other; Body Fluid  Result Value Ref Range Status   Specimen Description PLEURAL  Final   Special Requests A LEFT PLEURAL  Final   Gram Stain   Final    ABUNDANT WBC PRESENT,BOTH PMN AND MONONUCLEAR MODERATE GRAM POSITIVE COCCI    Culture   Final    CULTURE REINCUBATED FOR BETTER GROWTH Performed at [facility], [place] [address] [place], [place] [address]    Report Status PENDING  Incomplete  Acid Fast Smear (AFB)     Status: None   Collection Time: 10/26/20  8:31 AM   Specimen: PATH Other; Body Fluid  Result Value Ref Range Status   AFB Specimen Processing Concentration  Final   Acid Fast Smear Negative  Final    Comment: (NOTE) Performed At: [facility] [place], [place] [id number] Rush Farmer MD CL:[id number]    Source (AFB) PLEURAL  Final    Comment: Performed at [facility], [place] [address] [place], [place] [address]  Culture, fungus without smear     Status: None (Preliminary result)   Collection Time: 10/26/20  8:48 AM   Specimen: PATH Other; Lung  Result Value Ref Range Status   Specimen Description PLEURAL  Final   Special Requests C LT PLEURAL  Final   Culture   Final    NO FUNGUS ISOLATED AFTER 2 DAYS Performed at [facility], [place] [address]  [place], [place]  [address]    Report Status PENDING  Incomplete  Aerobic/Anaerobic Culture (surgical/deep wound)     Status: None (Preliminary result)   Collection Time: 10/26/20  8:48 AM   Specimen: PATH Other; Lung  Result Value Ref Range Status   Specimen Description PLEURAL  Final   Special Requests C LT PLEURAL  Final   Gram Stain   Final    ABUNDANT WBC PRESENT,BOTH PMN AND MONONUCLEAR RARE GRAM POSITIVE COCCI    Culture   Final    CULTURE REINCUBATED FOR BETTER GROWTH Performed at [facility], [place] [address], [place], [place] [address]    Report Status PENDING  Incomplete  Acid Fast Smear (AFB)     Status: None   Collection Time: 10/26/20  8:48 AM   Specimen: PATH Other; Lung  Result Value Ref Range Status   AFB Specimen Processing Concentration  Final   Acid Fast Smear Negative  Final    Comment: (NOTE) Performed At: [facility] [place], [place] [id number] Rush Farmer MD JS:[id number]    Source (AFB) PLEURAL  Final    Comment: Performed at [facility], [place] [address], [place], [place] [address]    Procedures and diagnostic studies:  DG Chest Port 1 View  Result Date: 10/28/2020 CLINICAL DATA:  Chest tube in place. EXAM: PORTABLE CHEST 1 VIEW COMPARISON:  Chest radiograph 10/27/2020. FINDINGS: Left subclavian central venous catheter tip projects over the central left brachiocephalic vein. Multiple left chest tubes in place. Stable cardiac and mediastinal contours. Persistent heterogeneous opacities left mid lower lung. Small amount of left pleural fluid. Possible small amount of pleural gas left lower hemithorax. IMPRESSION: 1. Multiple left chest tubes in place. Possible small amount of pleural gas left lower hemithorax. 2. Persistent left mid and lower lung heterogeneous opacities. Electronically Signed   By: Lovey Newcomer M.D.   On: 10/28/2020 10:59   DG Chest Port 1 View  Result Date: 10/27/2020 CLINICAL DATA:  Empyema.  Follow-up. EXAM:  PORTABLE CHEST 1 VIEW COMPARISON:  October 26, 2020 FINDINGS: The left central line is stable. Three left chest tube remain in stable position. No pneumothorax. Hyperinflation of the right lung persists. No right-sided opacities. Mild haziness in the left lung is stable. No acute interval changes. IMPRESSION: 1. Support apparatus as above. 2. Haziness in the left lung is stable to mildly improved. 3. No pneumothorax. Electronically Signed   By: Dorise Bullion III M.D   On: 10/27/2020 10:18    Medications:   . acetaminophen  1,000 mg Oral Q6H   Or  . acetaminophen (TYLENOL) oral liquid 160 mg/5 mL  1,000 mg Oral Q6H  . bisacodyl  10 mg Oral Daily  . Chlorhexidine Gluconate Cloth  6 each Topical Daily  . feeding supplement  237 mL Oral TID WC  . insulin aspart  0-24 Units Subcutaneous Q4H  . senna-docusate  1 tablet Oral QHS   Continuous Infusions: . sodium chloride    . sodium chloride 50 mL/hr at 10/27/20 1000  . bupivacaine 0.5 % ON-Q pump SINGLE CATH 400 mL    . ceFEPime (MAXIPIME) IV 2 g (10/28/20 0922)  . vancomycin 1,500 mg (10/28/20 1047)     LOS: 3 days   Geradine Girt  [facility]   How to contact the [facility] Attending or Consulting provider [place] or covering provider during after hours [place], for this patient?  1. Check the care team in  [facility] and look for a) attending/consulting [facility] provider listed and b) the [facility] team listed 2. Log into [email or web address] and use [place]'s universal password to access. If you do not have the password, please contact the hospital operator. 3. Locate the [facility] provider you are looking for under [facility] and page to a number that you can be directly reached. 4. If you still have difficulty reaching the provider, please page the [facility] (Director on Call) for the Hospitalists listed on amion for assistance.  10/28/2020, 1:07 PM

## 2020-10-28 NOTE — Hospital Course (Addendum)
  History of Present Illness:     64 year old non-smoker admitted to the ED with left empyema and compressive atelectasis of the left lung by CT scan. The patient was seen in the ED 7 to 10 days ago with flulike symptoms and productive cough.  Chest x-ray showed a left lower lobe pneumonia and he was placed on oral antibiotics. He did not improve and developed pleuritic chest pain shortness of breath loss of appetite and malaise.  A visit to his primary care physician today with chest x-ray showed opacification of the left hemithorax and he was sent to the ED.  CT scan shows large left empyema with almost complete atelectasis of the left lung.  There is no pericardial effusion.   Today his white count is 11,000 and hemoglobin 11.3.  He is afebrile and nontoxic but very uncomfortable.  The patient is being admitted for planned left VATS and drainage of empyema and decortication of left lung in the morning.   The patient had a previous lap band operation 8 years ago and tolerated that operation and anesthesia well.  He has no cardiac history.  He has no history of thoracic trauma.  He is allergic to penicillin.  He lives with his wife and is currently helping assist his sister who has small cell lung cancer.  Hospital course: Due to the findings on CT scan and studies Dr. Darcey Nora recommended video-assisted thoracoscopic surgery and on 10/26/2020 he underwent a left VATS for drainage of large empyema and decortication of the left lung.  He tolerated it well was taken to the postanesthesia care unit in stable condition  Postoperative hospital course: Patient is making adequate progress.  He has 3 chest tubes in place and they continue to drain.  It is slowing over time.  Will probably begin to discontinue tubes starting on 10/29/2020.  He has made good progress with his pulmonary status overall and oxygen has been weaned.  He has remained hemodynamically stable.  He has been on both vancomycin and cefepime  and white blood cell count is stabilized as of 10/28/2020 at 15,000.  Incisions are noted to be healing without evidence of infection.  He does have some protein calorie malnutrition with an albumin level 1.8.  We are pushing aggressive pulmonary toilet and mobilization as able. One of his chest tubes were removed on 12/20. He remained on antibiotics for treatment of his empyema.

## 2020-10-28 NOTE — Progress Notes (Signed)
Pharmacy Antibiotic Note  [name] [name] is a 64 y.o. male admitted on 10/25/2020 with empyema. Pharmacy has been consulted for Cefepime and Vancomycin dosing. WBC elevated but stable at 15.5. Scr stable 0.72, CrCl 107. Pleural fluid with moderate GPC on gram stain.   Plan:  - Cefepime 2g IV q8h - Vancomycin 1500 mg IV q12 hrs - Monitor cultures, renal function, clinical progression - Check vancomycin level as indicated  Richardine Service, PharmD, BCPS PGY2 Cardiology Pharmacy Resident Phone: [phone] 10/28/2020  2:08 PM  Please check [email or web address] for unit-specific pharmacy phone numbers.     Temp (24hrs), Avg:98.1 F (36.7 C), Min:97.8 F (36.6 C), Max:98.7 F (37.1 C)  Recent Labs  Lab 10/25/20 1008 10/25/20 2151 10/26/20 0216 10/27/20 0056 10/28/20 0335  WBC 11.2*  --  9.9 15.0* 15.5*  CREATININE 0.78 0.68 0.66 0.68 0.72  LATICACIDVEN  --  1.3  --   --   --     Estimated Creatinine Clearance: 107 mL/min (by C-G formula based on SCr of 0.72 mg/dL).    Allergies  Allergen Reactions  . Penicillins Anaphylaxis    Did it involve swelling of the face/tongue/throat, SOB, or low BP? Y Did it involve sudden or severe rash/hives, skin peeling, or any reaction on the inside of your mouth or nose? Y Did you need to seek medical attention at a hospital or doctor's office? Y When did it last happen?Childhood If all above answers are "NO", may proceed with cephalosporin use.    . Codeine Nausea And Vomiting    Antimicrobials this admission: 12/16 Cefepime >>  12/16 Vancomycin >>   Dose adjustments this admission: N/a  Microbiology results: 12/17 pleural fluid: moderate GPC on gram stain 12/16 BCx: ngtd

## 2020-10-28 NOTE — Progress Notes (Addendum)
[address]       [place] [address]             [phone]      2 Days Post-Op Procedure(s) (LRB): VIDEO ASSISTED THORACOSCOPY (VATS)/EMPYEMA ,INSERTION OF LEFT PLEURAL CHEST TUBES X 3 (Left) DECORTICATION OF LEFT LUNG, APPLICATION OF ON Q-PUMP FOR PAIN MANAGEMENT (Left) Subjective: Feeling better, breathing is more comfortable  Objective: Vital signs in last 24 hours: Temp:  [97.7 F (36.5 C)-98.1 F (36.7 C)] 97.8 F (36.6 C) (12/19 0744) Pulse Rate:  [57-85] 74 (12/19 0744) Cardiac Rhythm: Normal sinus rhythm (12/19 0800) Resp:  [15-20] 20 (12/19 0744) BP: (125-158)/(73-98) 143/88 (12/19 0744) SpO2:  [95 %-98 %] 98 % (12/19 0800)  Hemodynamic parameters for last 24 hours:    Intake/Output from previous day: 12/18 0701 - 12/19 0700 In: 640 [P.O.:240; IV Piggyback:400] Out: 590 [Urine:300; Chest Tube:290] Intake/Output this shift: Total I/O In: 120 [P.O.:120] Out: -   General appearance: alert, cooperative and no distress Heart: regular rate and rhythm and freq extrasystoles Lungs: dim air exchange on left, c/w right- mostly clear Abdomen: benign EXT- no edema  Lab Results: Recent Labs    10/27/20 0056 10/28/20 0335  WBC 15.0* 15.5*  HGB 10.8* 10.2*  HCT 33.6* 33.0*  PLT 498* 533*   BMET:  Recent Labs    10/27/20 0056 10/28/20 0335  NA 136 136  K 4.4 4.1  CL 102 100  CO2 24 27  GLUCOSE 113* 76  BUN 17 17  CREATININE 0.68 0.72  CALCIUM 8.6* 8.1*    PT/INR:  Recent Labs    10/25/20 2151  LABPROT 15.9*  INR 1.3*   ABG    Component Value Date/Time   PHART 7.413 10/27/2020 0417   HCO3 25.0 10/27/2020 0417   TCO2 30 10/25/2020 2239   O2SAT 96.8 10/27/2020 0417   CBG (last 3)  Recent Labs    10/28/20 0205 10/28/20 0514 10/28/20 0747  GLUCAP 95 76 94   Results for orders placed or performed during the hospital encounter of 10/25/20  Culture, blood (routine x 2)     Status: None (Preliminary result)    Collection Time: 10/25/20 12:48 PM   Specimen: BLOOD  Result Value Ref Range Status   Specimen Description BLOOD BLOOD LEFT FOREARM  Final   Special Requests   Final    BOTTLES DRAWN AEROBIC AND ANAEROBIC Blood Culture adequate volume   Culture   Final    NO GROWTH 2 DAYS Performed at [facility], [address], [place], [place] [address]    Report Status PENDING  Incomplete  Culture, blood (routine x 2)     Status: None (Preliminary result)   Collection Time: 10/25/20 12:53 PM   Specimen: BLOOD  Result Value Ref Range Status   Specimen Description BLOOD RIGHT ANTECUBITAL  Final   Special Requests   Final    BOTTLES DRAWN AEROBIC AND ANAEROBIC Blood Culture adequate volume   Culture   Final    NO GROWTH 2 DAYS Performed at [facility], [place] [address] [place], [place] [address]    Report Status PENDING  Incomplete  Resp Panel by RT-PCR (Flu A&B, Covid) Nasopharyngeal Swab     Status: None   Collection Time: 10/25/20  1:12 PM   Specimen: Nasopharyngeal Swab; Nasopharyngeal(NP) swabs in vial transport medium  Result Value Ref Range Status   SARS Coronavirus 2 by RT PCR NEGATIVE NEGATIVE Final  Comment: (NOTE) SARS-CoV-2 target nucleic acids are NOT DETECTED.  The SARS-CoV-2 RNA is generally detectable in upper respiratory specimens during the acute phase of infection. The lowest concentration of SARS-CoV-2 viral copies this assay can detect is 138 copies/mL. A negative result does not preclude SARS-Cov-2 infection and should not be used as the sole basis for treatment or other patient management decisions. A negative result may occur with  improper specimen collection/handling, submission of specimen other than nasopharyngeal swab, presence of viral mutation(s) within the areas targeted by this assay, and inadequate number of viral copies(<138 copies/mL). A negative result must be combined with clinical observations, patient history, and  epidemiological information. The expected result is Negative.  Fact Sheet for Patients:  EntrepreneurPulse.com.au  Fact Sheet for Healthcare Providers:  IncredibleEmployment.be  This test is no t yet approved or cleared by the [place] FDA and  has been authorized for detection and/or diagnosis of SARS-CoV-2 by FDA under an Emergency Use Authorization (EUA). This EUA will remain  in effect (meaning this test can be used) for the duration of the COVID-19 declaration under Section 564(b)(1) of the Act, 21 U.S.C.section 360bbb-3(b)(1), unless the authorization is terminated  or revoked sooner.       Influenza A by PCR NEGATIVE NEGATIVE Final   Influenza B by PCR NEGATIVE NEGATIVE Final    Comment: (NOTE) The Xpert Xpress SARS-CoV-2/FLU/RSV plus assay is intended as an aid in the diagnosis of influenza from Nasopharyngeal swab specimens and should not be used as a sole basis for treatment. Nasal washings and aspirates are unacceptable for Xpert Xpress SARS-CoV-2/FLU/RSV testing.  Fact Sheet for Patients: EntrepreneurPulse.com.au  Fact Sheet for Healthcare Providers: IncredibleEmployment.be  This test is not yet approved or cleared by the [place] FDA and has been authorized for detection and/or diagnosis of SARS-CoV-2 by FDA under an Emergency Use Authorization (EUA). This EUA will remain in effect (meaning this test can be used) for the duration of the COVID-19 declaration under Section 564(b)(1) of the Act, 21 U.S.C. section 360bbb-3(b)(1), unless the authorization is terminated or revoked.  Performed at [facility], [place] [address], [place], [place] [address]   Body fluid culture     Status: None (Preliminary result)   Collection Time: 10/26/20  8:31 AM   Specimen: PATH Other; Body Fluid  Result Value Ref Range Status   Specimen Description PLEURAL  Final   Special Requests A LEFT  PLEURAL  Final   Gram Stain   Final    ABUNDANT WBC PRESENT,BOTH PMN AND MONONUCLEAR MODERATE GRAM POSITIVE COCCI    Culture   Final    NO GROWTH 1 DAY Performed at [facility], [place] [address], [place], [place] [address]    Report Status PENDING  Incomplete  Acid Fast Smear (AFB)     Status: None   Collection Time: 10/26/20  8:31 AM   Specimen: PATH Other; Body Fluid  Result Value Ref Range Status   AFB Specimen Processing Concentration  Final   Acid Fast Smear Negative  Final    Comment: (NOTE) Performed At: [facility] [place], [place] [id number] Rush Farmer MD OB:[id number]    Source (AFB) PLEURAL  Final    Comment: Performed at [facility], [place] [address], [place],  [address]  Culture, fungus without smear     Status: None (Preliminary result)   Collection Time: 10/26/20  8:48 AM   Specimen: PATH Other; Lung  Result Value Ref  Range Status   Specimen Description PLEURAL  Final   Special Requests C LT PLEURAL  Final   Culture   Final    NO FUNGUS ISOLATED AFTER 2 DAYS Performed at [facility], [address], [place], [place] [address]    Report Status PENDING  Incomplete  Aerobic/Anaerobic Culture (surgical/deep wound)     Status: None (Preliminary result)   Collection Time: 10/26/20  8:48 AM   Specimen: PATH Other; Lung  Result Value Ref Range Status   Specimen Description PLEURAL  Final   Special Requests C LT PLEURAL  Final   Gram Stain   Final    ABUNDANT WBC PRESENT,BOTH PMN AND MONONUCLEAR RARE GRAM POSITIVE COCCI    Culture   Final    NO GROWTH 1 DAY Performed at [facility], [place] [address] [place], [place] [address]    Report Status PENDING  Incomplete  Acid Fast Smear (AFB)     Status: None   Collection Time: 10/26/20  8:48 AM   Specimen: PATH Other; Lung  Result Value Ref Range Status   AFB Specimen Processing Concentration  Final   Acid Fast Smear Negative  Final    Comment:  (NOTE) Performed At: [facility] [place], [place] [id number] Rush Farmer MD YW:[id number]    Source (AFB) PLEURAL  Final    Comment: Performed at [facility], [place] [address], [place], [place] [address]   Meds Scheduled Meds: . acetaminophen  1,000 mg Oral Q6H   Or  . acetaminophen (TYLENOL) oral liquid 160 mg/5 mL  1,000 mg Oral Q6H  . bisacodyl  10 mg Oral Daily  . Chlorhexidine Gluconate Cloth  6 each Topical Daily  . insulin aspart  0-24 Units Subcutaneous Q4H  . senna-docusate  1 tablet Oral QHS   Continuous Infusions: . sodium chloride    . sodium chloride 50 mL/hr at 10/27/20 1000  . bupivacaine 0.5 % ON-Q pump SINGLE CATH 400 mL    . ceFEPime (MAXIPIME) IV 2 g (10/28/20 0922)  . vancomycin 1,500 mg (10/27/20 2113)   PRN Meds:.Place/Maintain arterial line **AND** sodium chloride, ondansetron (ZOFRAN) IV, traMADol  Xrays DG Chest Port 1 View  Result Date: 10/27/2020 CLINICAL DATA:  Empyema.  Follow-up. EXAM: PORTABLE CHEST 1 VIEW COMPARISON:  October 26, 2020 FINDINGS: The left central line is stable. Three left chest tube remain in stable position. No pneumothorax. Hyperinflation of the right lung persists. No right-sided opacities. Mild haziness in the left lung is stable. No acute interval changes. IMPRESSION: 1. Support apparatus as above. 2. Haziness in the left lung is stable to mildly improved. 3. No pneumothorax. Electronically Signed   By: Dorise Bullion III M.D   On: 10/27/2020 10:18   DG Chest Port 1 View  Result Date: 10/26/2020 CLINICAL DATA:  64 year old male with a history vats EXAM: PORTABLE CHEST 1 VIEW COMPARISON:  10/25/2020, CT chest 10/26/2019 FINDINGS: Cardiomediastinal silhouette within normal limits, with improved visualization of the left mediastinum and left heart border. Interval resolution of dense opacity of the left hemithorax. Reticulonodular pattern of opacity throughout the left lung persist. Similar  appearance of coarsened interstitial markings on the right. Interval placement of 3 thoracostomy tubes, within the sub pulmonic and the left lateral pleural space. Interval placement of left subclavian central venous catheter, with the tip appearing to terminate in the SVC. No pneumothorax. IMPRESSION: Interval surgical changes of VATS and 3 left-sided thoracostomy tubes for treatment of  left pleural disease with no visualized pneumothorax and interval resolution of the left pleural fluid. Reticulonodular opacities of the left lung, potentially combination of chronic and acute change. Chronic changes of the right lung. Interval placement of left subclavian central venous catheter with the tip appearing to terminate SVC. Electronically Signed   By: Corrie Mckusick D.O.   On: 10/26/2020 11:17    Assessment/Plan: S/P Procedure(s) (LRB): VIDEO ASSISTED THORACOSCOPY (VATS)/EMPYEMA ,INSERTION OF LEFT PLEURAL CHEST TUBES X 3 (Left) DECORTICATION OF LEFT LUNG, APPLICATION OF ON Q-PUMP FOR PAIN MANAGEMENT (Left)  1 afeb, VSS, some systolic HTN readings, sinus rhythm 2 sats good on RA 3 BS well controlled 4 normal renal fxn 5 CT 290 cc last 24 hours, small air leak with cough- poss begin to remove tubes soon 6 WBC stable at 15, cultures neg, cont vanco/cefepime 7 H/H is stable 8 increased thrombocytosis- reactive 9 Protein -cal malnutrition with alb 1.8  LOS: 3 days    John Giovanni PA-C Pager [phone] 10/28/2020  Follow up chest xray  Leave chest tubes today  I have seen and examined [name] [name] and agree with the above assessment  and plan.  Grace Isaac MD Beeper [phone] Office [phone] 10/28/2020 10:52 AM

## 2020-10-28 NOTE — Plan of Care (Signed)
  Problem: Education: Goal: Knowledge of General Education information will improve Description: Including pain rating scale, medication(s)/side effects and non-pharmacologic comfort measures Outcome: Progressing   Problem: Health Behavior/Discharge Planning: Goal: Ability to manage health-related needs will improve Outcome: Progressing   Problem: Clinical Measurements: Goal: Diagnostic test results will improve Outcome: Progressing   Problem: Clinical Measurements: Goal: Respiratory complications will improve Outcome: Progressing   Problem: Activity: Goal: Risk for activity intolerance will decrease Outcome: Progressing   Problem: Nutrition: Goal: Adequate nutrition will be maintained Outcome: Progressing   Problem: Pain Managment: Goal: General experience of comfort will improve Outcome: Progressing   Problem: Skin Integrity: Goal: Risk for impaired skin integrity will decrease Outcome: Progressing   

## 2020-10-29 ENCOUNTER — Inpatient Hospital Stay (HOSPITAL_COMMUNITY): Payer: PPO

## 2020-10-29 LAB — CBC
HCT: 33.3 % — ABNORMAL LOW (ref 39.0–52.0)
Hemoglobin: 10.6 g/dL — ABNORMAL LOW (ref 13.0–17.0)
MCH: 28.1 pg (ref 26.0–34.0)
MCHC: 31.8 g/dL (ref 30.0–36.0)
MCV: 88.3 fL (ref 80.0–100.0)
Platelets: 435 10*3/uL — ABNORMAL HIGH (ref 150–400)
RBC: 3.77 MIL/uL — ABNORMAL LOW (ref 4.22–5.81)
RDW: 12.7 % (ref 11.5–15.5)
WBC: 14.9 10*3/uL — ABNORMAL HIGH (ref 4.0–10.5)
nRBC: 0 % (ref 0.0–0.2)

## 2020-10-29 LAB — TYPE AND SCREEN
ABO/RH(D): O POS
Antibody Screen: NEGATIVE
Unit division: 0
Unit division: 0

## 2020-10-29 LAB — BODY FLUID CULTURE

## 2020-10-29 LAB — GLUCOSE, CAPILLARY
Glucose-Capillary: 77 mg/dL (ref 70–99)
Glucose-Capillary: 175 mg/dL — ABNORMAL HIGH (ref 70–99)
Glucose-Capillary: 86 mg/dL (ref 70–99)
Glucose-Capillary: 113 mg/dL — ABNORMAL HIGH (ref 70–99)
Glucose-Capillary: 148 mg/dL — ABNORMAL HIGH (ref 70–99)
Glucose-Capillary: 97 mg/dL (ref 70–99)

## 2020-10-29 LAB — BPAM RBC
Blood Product Expiration Date: 202201172359
Blood Product Expiration Date: 202201172359
Unit Type and Rh: 5100
Unit Type and Rh: 5100

## 2020-10-29 LAB — SURGICAL PATHOLOGY

## 2020-10-29 LAB — BASIC METABOLIC PANEL
Anion gap: 11 (ref 5–15)
BUN: 10 mg/dL (ref 8–23)
CO2: 29 mmol/L (ref 22–32)
Calcium: 8.5 mg/dL — ABNORMAL LOW (ref 8.9–10.3)
Chloride: 96 mmol/L — ABNORMAL LOW (ref 98–111)
Creatinine, Ser: 0.7 mg/dL (ref 0.61–1.24)
GFR, Estimated: >60 mL/min (ref >60)
Glucose, Bld: 84 mg/dL (ref 70–99)
Potassium: 3.8 mmol/L (ref 3.5–5.1)
Sodium: 136 mmol/L (ref 135–145)

## 2020-10-29 LAB — VANCOMYCIN, TROUGH: Vancomycin Tr: 18 ug/mL (ref 15–20)

## 2020-10-29 LAB — CYTOLOGY - NON PAP

## 2020-10-29 NOTE — Progress Notes (Signed)
Progress Note    [name] [name]  IHK:[id number] DOB: Apr 24, 1956  DOA: 10/25/2020 PCP: Janith Lima, MD    Brief Narrative:     Medical records reviewed and are as summarized below:  [name] [name] is an 64 y.o. male with medical history significant of hypertension, hyperlipidemia, diet-controlled diabetes mellitus type 2,  S/p gastric band, and GERD presents with with complaints of having pneumonia.   He was found to have a left chest empyema and underwent drainage and VATS on 12/17  Assessment/Plan:   Principal Problem:   Empyema ([place]) Active Problems:   LAP-BAND surgery status   Normocytic anemia   AAA (abdominal aortic aneurysm) (HCC)   Leukocytosis   Empyema of pleural space (HCC)   Left chest empyema: Acute.   -Status post VATS on 12/17; with insertion of chest tube x3 -1 chest tube removed on 12/20 -Cultures pending but showing gram-positive cocci -CVTS appreciated -Continue IV antibiotics, de-escalate as able -Ambulate -Pain control per CVTS  Leukocytosis/SIRS: Acute.   -Trend  AAA:   Patient with 4.2 cm ascending aortic aneurysm noted on CTA of the chest. -Patient will need annual monitoring   Normocytic anemia:  -Daily labs to monitor  Status post lap-band History of diabetes and hypertension:  He has not been on any medications for blood pressure or diabetes since gastric lap band and weight loss    Family Communication/Anticipated D/C date and plan/Code Status   DVT prophylaxis: Per CVTS Code Status: Full Code.  Disposition Plan: Status is: Inpatient  Remains inpatient appropriate because:Inpatient level of care appropriate due to severity of illness          Subjective:   Feels better with 3rd chest tube removed  Objective:    Vitals:   10/28/20 1943 10/28/20 2319 10/29/20 0332 10/29/20 0717  BP: (!) 165/88 (!) 153/99 134/89 107/88  Pulse: 84 80 81   Resp: 19 19 16 19   Temp: 98.4 F (36.9 C) 98.4 F (36.9 C) 98 F  (36.7 C) 97.7 F (36.5 C)  TempSrc: Oral Oral Oral Oral  SpO2: 98% 99% 99%     Intake/Output Summary (Last 24 hours) at 10/29/2020 1132 Last data filed at 10/29/2020 7564 Gross per 24 hour  Intake 2463.87 ml  Output 1167 ml  Net 1296.87 ml   There were no vitals filed for this visit.  Exam:  In bed, NAD No increased work of breathing Down to 2 chest tubes  Data Reviewed:   I have personally reviewed following labs and imaging studies:  Labs: Labs show the following:   Basic Metabolic Panel: Recent Labs  Lab 10/25/20 2151 10/25/20 2239 10/26/20 0216 10/27/20 0056 10/28/20 0335 10/29/20 0345  NA 133* 136 135 136 136 136  K 4.3 4.2 4.2 4.4 4.1 3.8  CL 97*  --  105 102 100 96*  CO2 24  --  21* 24 27 29   GLUCOSE 157*  --  89 113* 76 84  BUN 14  --  14 17 17 10   CREATININE 0.68  --  0.66 0.68 0.72 0.70  CALCIUM 8.4*  --  8.0* 8.6* 8.1* 8.5*   GFR Estimated Creatinine Clearance: 107 mL/min (by C-G formula based on SCr of 0.7 mg/dL). Liver Function Tests: Recent Labs  Lab 10/25/20 2151 10/28/20 0335  AST 16 43*  ALT 22 37  ALKPHOS 74 70  BILITOT 0.4 0.6  PROT 6.7 5.5*  ALBUMIN 2.1* 1.8*   No results for input(s): LIPASE,  AMYLASE in the last 168 hours. No results for input(s): AMMONIA in the last 168 hours. Coagulation profile Recent Labs  Lab 10/25/20 2151  INR 1.3*    CBC: Recent Labs  Lab 10/25/20 1008 10/25/20 2239 10/26/20 0216 10/27/20 0056 10/28/20 0335 10/29/20 0345  WBC 11.2*  --  9.9 15.0* 15.5* 14.9*  HGB 11.3* 10.5* 10.2* 10.8* 10.2* 10.6*  HCT 35.2* 31.0* 32.2* 33.6* 33.0* 33.3*  MCV 88.7  --  91.5 87.7 89.7 88.3  PLT 305  --  181 498* 533* 435*   Cardiac Enzymes: No results for input(s): CKTOTAL, CKMB, CKMBINDEX, TROPONINI in the last 168 hours. BNP (last 3 results) No results for input(s): PROBNP in the last 8760 hours. CBG: Recent Labs  Lab 10/28/20 1601 10/28/20 2015 10/28/20 2348 10/29/20 0425 10/29/20 0819   GLUCAP 125* 81 79 77 175*   D-Dimer: No results for input(s): DDIMER in the last 72 hours. Hgb A1c: No results for input(s): HGBA1C in the last 72 hours. Lipid Profile: No results for input(s): CHOL, HDL, LDLCALC, TRIG, CHOLHDL, LDLDIRECT in the last 72 hours. Thyroid function studies: No results for input(s): TSH, T4TOTAL, T3FREE, THYROIDAB in the last 72 hours.  Invalid input(s): FREET3 Anemia work up: No results for input(s): VITAMINB12, FOLATE, FERRITIN, TIBC, IRON, RETICCTPCT in the last 72 hours. Sepsis Labs: Recent Labs  Lab 10/25/20 2151 10/26/20 0216 10/27/20 0056 10/28/20 0335 10/29/20 0345  WBC  --  9.9 15.0* 15.5* 14.9*  LATICACIDVEN 1.3  --   --   --   --     Microbiology Recent Results (from the past 240 hour(s))  Culture, blood (routine x 2)     Status: None (Preliminary result)   Collection Time: 10/25/20 12:48 PM   Specimen: BLOOD  Result Value Ref Range Status   Specimen Description BLOOD BLOOD LEFT FOREARM  Final   Special Requests   Final    BOTTLES DRAWN AEROBIC AND ANAEROBIC Blood Culture adequate volume   Culture   Final    NO GROWTH 4 DAYS Performed at [facility], [place] [address] [place], [place] [address]    Report Status PENDING  Incomplete  Culture, blood (routine x 2)     Status: None (Preliminary result)   Collection Time: 10/25/20 12:53 PM   Specimen: BLOOD  Result Value Ref Range Status   Specimen Description BLOOD RIGHT ANTECUBITAL  Final   Special Requests   Final    BOTTLES DRAWN AEROBIC AND ANAEROBIC Blood Culture adequate volume   Culture   Final    NO GROWTH 4 DAYS Performed at [facility], [place] [address], [place], [place] [address]    Report Status PENDING  Incomplete  Resp Panel by RT-PCR (Flu A&B, Covid) Nasopharyngeal Swab     Status: None   Collection Time: 10/25/20  1:12 PM   Specimen: Nasopharyngeal Swab; Nasopharyngeal(NP) swabs in vial transport medium  Result Value Ref Range Status   SARS  Coronavirus 2 by RT PCR NEGATIVE NEGATIVE Final    Comment: (NOTE) SARS-CoV-2 target nucleic acids are NOT DETECTED.  The SARS-CoV-2 RNA is generally detectable in upper respiratory specimens during the acute phase of infection. The lowest concentration of SARS-CoV-2 viral copies this assay can detect is 138 copies/mL. A negative result does not preclude SARS-Cov-2 infection and should not be used as the sole basis for treatment or other patient management decisions. A negative result may occur with  improper specimen collection/handling, submission of specimen other than  nasopharyngeal swab, presence of viral mutation(s) within the areas targeted by this assay, and inadequate number of viral copies(<138 copies/mL). A negative result must be combined with clinical observations, patient history, and epidemiological information. The expected result is Negative.  Fact Sheet for Patients:  EntrepreneurPulse.com.au  Fact Sheet for Healthcare Providers:  IncredibleEmployment.be  This test is no t yet approved or cleared by the [place] FDA and  has been authorized for detection and/or diagnosis of SARS-CoV-2 by FDA under an Emergency Use Authorization (EUA). This EUA will remain  in effect (meaning this test can be used) for the duration of the COVID-19 declaration under Section 564(b)(1) of the Act, 21 U.S.C.section 360bbb-3(b)(1), unless the authorization is terminated  or revoked sooner.       Influenza A by PCR NEGATIVE NEGATIVE Final   Influenza B by PCR NEGATIVE NEGATIVE Final    Comment: (NOTE) The Xpert Xpress SARS-CoV-2/FLU/RSV plus assay is intended as an aid in the diagnosis of influenza from Nasopharyngeal swab specimens and should not be used as a sole basis for treatment. Nasal washings and aspirates are unacceptable for Xpert Xpress SARS-CoV-2/FLU/RSV testing.  Fact Sheet for  Patients: EntrepreneurPulse.com.au  Fact Sheet for Healthcare Providers: IncredibleEmployment.be  This test is not yet approved or cleared by the [place] FDA and has been authorized for detection and/or diagnosis of SARS-CoV-2 by FDA under an Emergency Use Authorization (EUA). This EUA will remain in effect (meaning this test can be used) for the duration of the COVID-19 declaration under Section 564(b)(1) of the Act, 21 U.S.C. section 360bbb-3(b)(1), unless the authorization is terminated or revoked.  Performed at [facility], [place] [address], [place], [place] [address]   Body fluid culture     Status: None (Preliminary result)   Collection Time: 10/26/20  8:31 AM   Specimen: PATH Other; Body Fluid  Result Value Ref Range Status   Specimen Description PLEURAL  Final   Special Requests A LEFT PLEURAL  Final   Gram Stain   Final    ABUNDANT WBC PRESENT,BOTH PMN AND MONONUCLEAR MODERATE GRAM POSITIVE COCCI    Culture   Final    CULTURE REINCUBATED FOR BETTER GROWTH Performed at [facility], [place] [address] [place], [place] [address]    Report Status PENDING  Incomplete  Acid Fast Smear (AFB)     Status: None   Collection Time: 10/26/20  8:31 AM   Specimen: PATH Other; Body Fluid  Result Value Ref Range Status   AFB Specimen Processing Concentration  Final   Acid Fast Smear Negative  Final    Comment: (NOTE) Performed At: [facility] [place], [place] [id number] Rush Farmer MD RC:[id number]    Source (AFB) PLEURAL  Final    Comment: Performed at [facility], [place] [address], [place], [place] [address]  Culture, fungus without smear     Status: None (Preliminary result)   Collection Time: 10/26/20  8:48 AM   Specimen: PATH Other; Lung  Result Value Ref Range Status   Specimen Description PLEURAL  Final   Special Requests C LT PLEURAL  Final   Culture   Final    NO FUNGUS ISOLATED  AFTER 3 DAYS Performed at [facility], [address], [place], [place] [address]    Report Status PENDING  Incomplete  Aerobic/Anaerobic Culture (surgical/deep wound)     Status: None (Preliminary result)   Collection Time: 10/26/20  8:48 AM   Specimen:  PATH Other; Lung  Result Value Ref Range Status   Specimen Description PLEURAL  Final   Special Requests C LT PLEURAL  Final   Gram Stain   Final    ABUNDANT WBC PRESENT,BOTH PMN AND MONONUCLEAR RARE GRAM POSITIVE COCCI    Culture   Final    CULTURE REINCUBATED FOR BETTER GROWTH Performed at [facility], [place] [address], [place], [place] [address]    Report Status PENDING  Incomplete  Acid Fast Smear (AFB)     Status: None   Collection Time: 10/26/20  8:48 AM   Specimen: PATH Other; Lung  Result Value Ref Range Status   AFB Specimen Processing Concentration  Final   Acid Fast Smear Negative  Final    Comment: (NOTE) Performed At: [facility] [place], [place] [id number] Rush Farmer MD OY:[id number]    Source (AFB) PLEURAL  Final    Comment: Performed at [facility], [place] [address], [place], [place] [address]    Procedures and diagnostic studies:  DG Chest Port 1 View  Result Date: 10/29/2020 CLINICAL DATA:  Chest tube.  History of empyema. EXAM: PORTABLE CHEST 1 VIEW COMPARISON:  10/28/2020. FINDINGS: Left subclavian line and multiple left chest tubes in stable position. Stable tiny left pneumothorax and left-sided pleural thickening. Persistent infiltrate left lung base without interim change. Right costophrenic angle incompletely imaged. Degenerative change thoracic spine. IMPRESSION: 1. Left subclavian line and multiple left chest tubes in stable position. Stable tiny left pneumothorax and left-sided pleural thickening. 2. Persistent infiltrate left lung base without interim change. Electronically Signed   By: Marcello Moores  Register   On: 10/29/2020 07:51   DG Chest Port 1  View  Result Date: 10/28/2020 CLINICAL DATA:  Chest tube in place. EXAM: PORTABLE CHEST 1 VIEW COMPARISON:  Chest radiograph 10/27/2020. FINDINGS: Left subclavian central venous catheter tip projects over the central left brachiocephalic vein. Multiple left chest tubes in place. Stable cardiac and mediastinal contours. Persistent heterogeneous opacities left mid lower lung. Small amount of left pleural fluid. Possible small amount of pleural gas left lower hemithorax. IMPRESSION: 1. Multiple left chest tubes in place. Possible small amount of pleural gas left lower hemithorax. 2. Persistent left mid and lower lung heterogeneous opacities. Electronically Signed   By: Lovey Newcomer M.D.   On: 10/28/2020 10:59    Medications:   . acetaminophen  1,000 mg Oral Q6H   Or  . acetaminophen (TYLENOL) oral liquid 160 mg/5 mL  1,000 mg Oral Q6H  . bisacodyl  10 mg Oral Daily  . Chlorhexidine Gluconate Cloth  6 each Topical Daily  . feeding supplement  237 mL Oral TID WC  . insulin aspart  0-24 Units Subcutaneous Q4H  . senna-docusate  1 tablet Oral QHS   Continuous Infusions: . sodium chloride    . sodium chloride 50 mL/hr at 10/27/20 1000  . bupivacaine 0.5 % ON-Q pump SINGLE CATH 400 mL    . ceFEPime (MAXIPIME) IV 2 g (10/29/20 0934)  . vancomycin 1,500 mg (10/29/20 1049)     LOS: 4 days   Geradine Girt  [facility]   How to contact the [facility] Attending or Consulting provider [place] or covering provider during after hours [place], for this patient?  1. Check the care team in [facility] and look for a) attending/consulting TRH provider listed and b) the [facility] team listed 2. Log into [email or web address] and use [place]'s universal password to access. If  you do not have the password, please contact the hospital operator. 3. Locate the [facility] provider you are looking for under [facility] and page to a number that you can be directly reached. 4. If you still have difficulty reaching the provider,  please page the [facility] (Director on Call) for the Hospitalists listed on amion for assistance.  10/29/2020, 11:32 AM

## 2020-10-29 NOTE — Progress Notes (Addendum)
      [address]       [place] [address]             [phone]      3 Days Post-Op Procedure(s) (LRB): VIDEO ASSISTED THORACOSCOPY (VATS)/EMPYEMA ,INSERTION OF LEFT PLEURAL CHEST TUBES X 3 (Left) DECORTICATION OF LEFT LUNG, APPLICATION OF ON Q-PUMP FOR PAIN MANAGEMENT (Left) Subjective: Feels much better this morning. He had a muscle spasm yesterday and it was extremely painful but feels much better this morning and has already walked in the halls.   Objective: Vital signs in last 24 hours: Temp:  [97.7 F (36.5 C)-98.7 F (37.1 C)] 97.7 F (36.5 C) (12/20 0717) Pulse Rate:  [77-84] 81 (12/20 0332) Cardiac Rhythm: Normal sinus rhythm (12/20 0752) Resp:  [16-19] 19 (12/20 0717) BP: (107-165)/(88-99) 107/88 (12/20 0717) SpO2:  [98 %-99 %] 99 % (12/20 0332)     Intake/Output from previous day: 12/19 0701 - 12/20 0700 In: 2583.9 [P.O.:560; I.V.:900; IV Piggyback:1123.9] Out: 1167 [Urine:950; Chest Tube:217] Intake/Output this shift: No intake/output data recorded.  General appearance: alert, cooperative and no distress Heart: regular rate and rhythm, S1, S2 normal, no murmur, click, rub or gallop Lungs: clear to auscultation bilaterally Abdomen: soft, non-tender; bowel sounds normal; no masses,  no organomegaly Extremities: extremities normal, atraumatic, no cyanosis or edema Wound: clean and dry  Lab Results: Recent Labs    10/28/20 0335 10/29/20 0345  WBC 15.5* 14.9*  HGB 10.2* 10.6*  HCT 33.0* 33.3*  PLT 533* 435*   BMET:  Recent Labs    10/28/20 0335 10/29/20 0345  NA 136 136  K 4.1 3.8  CL 100 96*  CO2 27 29  GLUCOSE 76 84  BUN 17 10  CREATININE 0.72 0.70  CALCIUM 8.1* 8.5*    PT/INR: No results for input(s): LABPROT, INR in the last 72 hours. ABG    Component Value Date/Time   PHART 7.413 10/27/2020 0417   HCO3 25.0 10/27/2020 0417   TCO2 30 10/25/2020 2239   O2SAT 96.8 10/27/2020 0417   CBG (last 3)  Recent Labs     10/28/20 2015 10/28/20 2348 10/29/20 0425  GLUCAP 81 79 77    Assessment/Plan: S/P Procedure(s) (LRB): VIDEO ASSISTED THORACOSCOPY (VATS)/EMPYEMA ,INSERTION OF LEFT PLEURAL CHEST TUBES X 3 (Left) DECORTICATION OF LEFT LUNG, APPLICATION OF ON Q-PUMP FOR PAIN MANAGEMENT (Left)  1. Chest tubes: put out 217cc/24 hours.  2. CXR: stable tiny left pneumo and left sided pleural thickening. Persistent infiltrate left lung base without change.  3. Renal-creatinine 0.70, electrolytes okay 4. H and H 10.6/33.3 5. Blood glucose well controlled 6. WBC 14.9, no recent fevers. Continue maxipime and vanco  Plan: Remove one chest tube today marked by Dr. Prescott Gum. Continue to use incentive spirometer. Continue to ambulate in the halls. Will order a CXR tomorrow.    LOS: 4 days    [name] 10/29/2020  OR cultures of pleural fluid and pleural peel>> strep intermedius  Will DC Vancomycin, cont maxipime up to DC then treat with  daily IV Rocephin at home for total of 3 weeks  Postop iv antibiotics  patient examined and medical record reviewed,agree with above note. Tharon Aquas Trigt III 10/29/2020

## 2020-10-29 NOTE — Plan of Care (Signed)
  Problem: Education: Goal: Knowledge of General Education information will improve Description Including pain rating scale, medication(s)/side effects and non-pharmacologic comfort measures Outcome: Progressing   

## 2020-10-29 NOTE — Progress Notes (Signed)
Pharmacy Antibiotic Note  [name] [name] is a 64 y.o. male admitted on 10/25/2020 with empyema. Pharmacy has been consulted for Cefepime and Vancomycin dosing. WBC elevated but stable at 14.9. Scr stable 0.7, CrCl ~ 100. Pleural fluid with moderate strep.  Vancomycin trough level drawn this morning within goal range at 18.   Plan:  - Cefepime 2g IV q8h - Continue Vancomycin 1500 mg IV q12 hrs - Monitor cultures, renal function, clinical progression - Narrow antibiotics soon?   Temp (24hrs), Avg:98.1 F (36.7 C), Min:97.7 F (36.5 C), Max:98.4 F (36.9 C)  Recent Labs  Lab 10/25/20 1008 10/25/20 2151 10/26/20 0216 10/27/20 0056 10/28/20 0335 10/29/20 0345 10/29/20 0942  WBC 11.2*  --  9.9 15.0* 15.5* 14.9*  --   CREATININE 0.78 0.68 0.66 0.68 0.72 0.70  --   LATICACIDVEN  --  1.3  --   --   --   --   --   VANCOTROUGH  --   --   --   --   --   --  18    Estimated Creatinine Clearance: 107 mL/min (by C-G formula based on SCr of 0.7 mg/dL).    Allergies  Allergen Reactions  . Penicillins Anaphylaxis    Did it involve swelling of the face/tongue/throat, SOB, or low BP? Y Did it involve sudden or severe rash/hives, skin peeling, or any reaction on the inside of your mouth or nose? Y Did you need to seek medical attention at a hospital or doctor's office? Y When did it last happen?Childhood If all above answers are "NO", may proceed with cephalosporin use.    . Codeine Nausea And Vomiting    Antimicrobials this admission: 12/16 Cefepime >>  12/16 Vancomycin >>   Dose adjustments this admission: N/a  Microbiology results: 12/17 pleural cx- few Strep 12/16 BCx x 2: ngtd  Marguerite Olea, [facility] Clinical Pharmacist  10/29/2020 1:44 PM   [facility] pharmacy phone numbers are listed on [email or web address]

## 2020-10-30 ENCOUNTER — Inpatient Hospital Stay: Payer: Self-pay

## 2020-10-30 ENCOUNTER — Encounter (HOSPITAL_COMMUNITY): Payer: Self-pay | Admitting: Cardiothoracic Surgery

## 2020-10-30 ENCOUNTER — Inpatient Hospital Stay (HOSPITAL_COMMUNITY): Payer: PPO

## 2020-10-30 LAB — GLUCOSE, CAPILLARY
Glucose-Capillary: 103 mg/dL — ABNORMAL HIGH (ref 70–99)
Glucose-Capillary: 141 mg/dL — ABNORMAL HIGH (ref 70–99)
Glucose-Capillary: 94 mg/dL (ref 70–99)
Glucose-Capillary: 94 mg/dL (ref 70–99)
Glucose-Capillary: 115 mg/dL — ABNORMAL HIGH (ref 70–99)

## 2020-10-30 LAB — BASIC METABOLIC PANEL
Anion gap: 8 (ref 5–15)
BUN: 9 mg/dL (ref 8–23)
CO2: 30 mmol/L (ref 22–32)
Calcium: 8.3 mg/dL — ABNORMAL LOW (ref 8.9–10.3)
Chloride: 99 mmol/L (ref 98–111)
Creatinine, Ser: 0.73 mg/dL (ref 0.61–1.24)
GFR, Estimated: >60 mL/min (ref >60)
Glucose, Bld: 155 mg/dL — ABNORMAL HIGH (ref 70–99)
Potassium: 3.6 mmol/L (ref 3.5–5.1)
Sodium: 137 mmol/L (ref 135–145)

## 2020-10-30 LAB — CULTURE, BLOOD (ROUTINE X 2)
Culture: NO GROWTH
Culture: NO GROWTH
Special Requests: ADEQUATE
Special Requests: ADEQUATE

## 2020-10-30 LAB — POCT I-STAT 7, (LYTES, BLD GAS, ICA,H+H)
Acid-Base Excess: 3 mmol/L — ABNORMAL HIGH (ref 0.0–2.0)
Acid-Base Excess: 3 mmol/L — ABNORMAL HIGH (ref 0.0–2.0)
Bicarbonate: 28.4 mmol/L — ABNORMAL HIGH (ref 20.0–28.0)
Bicarbonate: 28.5 mmol/L — ABNORMAL HIGH (ref 20.0–28.0)
Calcium, Ion: 1.24 mmol/L (ref 1.15–1.40)
Calcium, Ion: 1.23 mmol/L (ref 1.15–1.40)
HCT: 29 % — ABNORMAL LOW (ref 39.0–52.0)
HCT: 30 % — ABNORMAL LOW (ref 39.0–52.0)
Hemoglobin: 9.9 g/dL — ABNORMAL LOW (ref 13.0–17.0)
Hemoglobin: 10.2 g/dL — ABNORMAL LOW (ref 13.0–17.0)
O2 Saturation: 100 %
O2 Saturation: 97 %
Patient temperature: 35.9
Patient temperature: 34.9
Potassium: 4.1 mmol/L (ref 3.5–5.1)
Potassium: 4.3 mmol/L (ref 3.5–5.1)
Sodium: 136 mmol/L (ref 135–145)
Sodium: 136 mmol/L (ref 135–145)
TCO2: 30 mmol/L (ref 22–32)
TCO2: 30 mmol/L (ref 22–32)
pCO2 arterial: 45.1 mmHg (ref 32.0–48.0)
pCO2 arterial: 40.6 mmHg (ref 32.0–48.0)
pH, Arterial: 7.403 (ref 7.350–7.450)
pH, Arterial: 7.446 (ref 7.350–7.450)
pO2, Arterial: 171 mmHg — ABNORMAL HIGH (ref 83.0–108.0)
pO2, Arterial: 79 mmHg — ABNORMAL LOW (ref 83.0–108.0)

## 2020-10-30 LAB — CBC
HCT: 31.9 % — ABNORMAL LOW (ref 39.0–52.0)
Hemoglobin: 10.5 g/dL — ABNORMAL LOW (ref 13.0–17.0)
MCH: 28.8 pg (ref 26.0–34.0)
MCHC: 32.9 g/dL (ref 30.0–36.0)
MCV: 87.6 fL (ref 80.0–100.0)
Platelets: 377 10*3/uL (ref 150–400)
RBC: 3.64 MIL/uL — ABNORMAL LOW (ref 4.22–5.81)
RDW: 12.8 % (ref 11.5–15.5)
WBC: 12.9 10*3/uL — ABNORMAL HIGH (ref 4.0–10.5)
nRBC: 0 % (ref 0.0–0.2)

## 2020-10-30 MED ORDER — CEFAZOLIN SODIUM-DEXTROSE 2-4 GM/100ML-% IV SOLN
2.0000 g | Freq: Three times a day (TID) | INTRAVENOUS | Status: DC
  Administered 2020-10-30 – 2020-11-02 (×9): 2 g via INTRAVENOUS
  Filled 2020-10-30 (×10): qty 100

## 2020-10-30 NOTE — Progress Notes (Signed)
   10/30/20 2000  Clinical Encounter Type  Visited With Patient  Visit Type Spiritual support  Referral From Nurse  Consult/Referral To Chaplain  Spiritual Encounters  Spiritual Needs Emotional;Prayer;Grief support  The chaplain visited with the patient because he was grieving the recent loss of his sister. The chaplain offered active listening and the patient was able to voice his grief of not being present when his sister passed. The patient asked for prayer. The chaplain prayed. His wife or son will visit with him tonight once his family handles the passing of his sister. The chaplain will follow up as needed.

## 2020-10-30 NOTE — Progress Notes (Addendum)
      [address]       [place] [address]             [phone]      4 Days Post-Op Procedure(s) (LRB): VIDEO ASSISTED THORACOSCOPY (VATS)/EMPYEMA ,INSERTION OF LEFT PLEURAL CHEST TUBES X 3 (Left) DECORTICATION OF LEFT LUNG, APPLICATION OF ON Q-PUMP FOR PAIN MANAGEMENT (Left) Subjective: Feels good this morning. Just got back from walking.  Objective: Vital signs in last 24 hours: Temp:  [97.9 F (36.6 C)-99.3 F (37.4 C)] 98.4 F (36.9 C) (12/21 0635) Pulse Rate:  [66-72] 66 (12/21 0635) Cardiac Rhythm: Normal sinus rhythm (12/21 0700) Resp:  [16-20] 16 (12/21 0635) BP: (130-158)/(86-95) 152/93 (12/21 0635) SpO2:  [94 %-95 %] 94 % (12/21 0635)     Intake/Output from previous day: 12/20 0701 - 12/21 0700 In: -  Out: 610 [Urine:400; Chest Tube:210] Intake/Output this shift: No intake/output data recorded.  General appearance: alert, cooperative and no distress Heart: regular rate and rhythm, S1, S2 normal, no murmur, click, rub or gallop and occassional PVC Lungs: clear to auscultation bilaterally Abdomen: soft, non-tender; bowel sounds normal; no masses,  no organomegaly Extremities: extremities normal, atraumatic, no cyanosis or edema Wound: clean and dry  Lab Results: Recent Labs    10/29/20 0345 10/30/20 0258  WBC 14.9* 12.9*  HGB 10.6* 10.5*  HCT 33.3* 31.9*  PLT 435* 377   BMET:  Recent Labs    10/29/20 0345 10/30/20 0258  NA 136 137  K 3.8 3.6  CL 96* 99  CO2 29 30  GLUCOSE 84 155*  BUN 10 9  CREATININE 0.70 0.73  CALCIUM 8.5* 8.3*    PT/INR: No results for input(s): LABPROT, INR in the last 72 hours. ABG    Component Value Date/Time   PHART 7.413 10/27/2020 0417   HCO3 25.0 10/27/2020 0417   TCO2 30 10/26/2020 0914   O2SAT 96.8 10/27/2020 0417   CBG (last 3)  Recent Labs    10/29/20 2022 10/29/20 2312 10/30/20 0426  GLUCAP 148* 97 103*    Assessment/Plan: S/P Procedure(s) (LRB): VIDEO ASSISTED  THORACOSCOPY (VATS)/EMPYEMA ,INSERTION OF LEFT PLEURAL CHEST TUBES X 3 (Left) DECORTICATION OF LEFT LUNG, APPLICATION OF ON Q-PUMP FOR PAIN MANAGEMENT (Left)  1. Chest tubes: put out 210cc/24 hours.  2. CXR:  Persistent infiltrate left lung base without change.  3. Renal-creatinine 0.70, electrolytes okay 4. H and H 10.6/33.3, stable 5. Blood glucose well controlled 6. WBC 14.9, no recent fevers. Continue maxipime-Strep intermedius grew out of OR cultures. Will need IV Rocephin at home for 3 weeks.   Plan: Continue antibiotics. Might be able to get another chest tube out today. Productive cough, encouraged pulm toilet. Encouraged ambulation around the unit.    LOS: 5 days    [name] [name] 10/30/2020  Drainage from tubes decreased Low angled tube to come out of chest today Pharmacy has modified antibiotics per culture results Depending on sensitivities will decide about iv antibiotics vs po after d/c  Follow up chest xray in am I have seen and examined [name] [name] and agree with the above assessment  and plan.  Grace Isaac MD Beeper [phone] Office [phone] 10/30/2020 1:58 PM

## 2020-10-30 NOTE — Progress Notes (Signed)
ID Pharmacy Note   Spoke with [name] Rough, PA. Given [name] [name] very sensitive strep intermedius isolate (PCN MIC <0.06) and history of tolerance of oral cephalosporins, we will switch to Cefazolin 2 gm IV Q 8 hours. Noted that penicillin allergy was in childhood and he has tolerated several oral and IV cephalosporins since that time making him low risk for cross-reactive allergy.     Jimmy Footman, PharmD, BCPS, BCIDP Infectious Diseases Clinical Pharmacist Phone: [phone] 10/30/2020 9:51 AM

## 2020-10-30 NOTE — TOC Transition Note (Addendum)
Transition of Care [facility]) - CM/SW Discharge Note   Patient Details  Name: [name] [name] MRN: [id number] Date of Birth: March 09, 1956  Transition of Care [facility]) CM/SW Contact:  Zenon Mayo, RN Phone Number: 10/30/2020, 5:03 PM   Clinical Narrative:    NCM spoke with patient, informed patient that no HHRN available to staff at agencies on list, asked if it was ok for [facility] to supply the [facility], patient states yes.  NCM informed Carolynn Sayers, she will assist in getting [facility] thru [place] to do picc care for iv abx at home. Carolynn Sayers will supply medication.  12/23- Patient is for dc on 12/24, patient will receive his am dose here at the hospital, wife will do the afternoon dose, [facility] will come out to see patient on Sunday.  Carolynn Sayers is providing the medication and she has already done the teaching with patient and wife. Patient should be good to go on 12/24 after morning iv dose.    Final next level of care: [place] Barriers to Discharge: Continued Medical Work up   Patient Goals and CMS Choice Patient states their goals for this hospitalization and ongoing recovery are:: get better CMS Medicare.gov Compare Post Acute Care list provided to:: Patient Choice offered to / list presented to : Patient  Discharge Placement                       Discharge Plan and Services                  DME Agency: NA       HH Arranged: RN,IV Antibiotics HH Agency:  [facility]) Date HH Agency Contacted: 10/30/20 Time [place]: [place] Representative spoke with at [place]: [place] infusions and [place] for [facility]  Social Determinants of Health (SDOH) Interventions     Readmission Risk Interventions No flowsheet data found.

## 2020-10-30 NOTE — Progress Notes (Signed)
Progress Note    [name] [name]  [id number] DOB: 11-Jun-1956  DOA: 10/25/2020 PCP: Janith Lima, MD    Brief Narrative:     Medical records reviewed and are as summarized below:  [name] [name] is an 64 y.o. male with medical history significant of hypertension, hyperlipidemia, diet-controlled diabetes mellitus type 2,  S/p gastric band, and GERD presents with with complaints of having pneumonia.   He was found to have a left chest empyema and underwent drainage and VATS on 12/17  Assessment/Plan:   Principal Problem:   Empyema ([place]) Active Problems:   LAP-BAND surgery status   Normocytic anemia   AAA (abdominal aortic aneurysm) (HCC)   Leukocytosis   Empyema of pleural space (HCC)   Left chest empyema: Acute.   -Status post VATS on 12/17; with insertion of chest tube x3 -1 chest tube removed on 12/20 -Cultures:  Strep intermedius -CVTS appreciated -Continue IV antibiotics, de-escalate as able- plan for 3 weeks of IV abx at home -Ambulate  Leukocytosis/SIRS: Acute.   -Trending down  AAA:   Patient with 4.2 cm ascending aortic aneurysm noted on CTA of the chest. -Patient will need annual monitoring   Normocytic anemia:  -stable  Status post lap-band History of diabetes and hypertension:  He has not been on any medications for blood pressure or diabetes since gastric lap band and weight loss   Will sign off patient.  TRH available as needed   Family Communication/Anticipated D/C date and plan/Code Status   DVT prophylaxis: Per CVTS Code Status: Full Code.  Disposition Plan: Status is: Inpatient  Remains inpatient appropriate because:Inpatient level of care appropriate due to severity of illness    Subjective:   No complaints  Objective:    Vitals:   10/29/20 1956 10/29/20 2314 10/30/20 0635 10/30/20 0745  BP: 130/88 (!) 158/91 (!) 152/93 123/79  Pulse: 72 71 66 83  Resp: 20 16 16 19   Temp: 98.2 F (36.8 C) 99.3 F (37.4 C) 98.4  F (36.9 C) 97.9 F (36.6 C)  TempSrc: Oral Oral Oral Oral  SpO2: 95% 95% 94% 90%    Intake/Output Summary (Last 24 hours) at 10/30/2020 1029 Last data filed at 10/30/2020 0636 Gross per 24 hour  Intake --  Output 610 ml  Net -610 ml   There were no vitals filed for this visit.  Exam: Up walking in the hallway NAD Chest tubes in place  Data Reviewed:   I have personally reviewed following labs and imaging studies:  Labs: Labs show the following:   Basic Metabolic Panel: Recent Labs  Lab 10/26/20 0216 10/26/20 0825 10/26/20 0914 10/27/20 0056 10/28/20 0335 10/29/20 0345 10/30/20 0258  NA 135   < > 136 136 136 136 137  K 4.2   < > 4.3 4.4 4.1 3.8 3.6  CL 105  --   --  102 100 96* 99  CO2 21*  --   --  24 27 29 30   GLUCOSE 89  --   --  113* 76 84 155*  BUN 14  --   --  17 17 10 9   CREATININE 0.66  --   --  0.68 0.72 0.70 0.73  CALCIUM 8.0*  --   --  8.6* 8.1* 8.5* 8.3*   < > = values in this interval not displayed.   GFR Estimated Creatinine Clearance: 107 mL/min (by C-G formula based on SCr of 0.73 mg/dL). Liver Function Tests: Recent Labs  Lab 10/25/20  2151 10/28/20 0335  AST 16 43*  ALT 22 37  ALKPHOS 74 70  BILITOT 0.4 0.6  PROT 6.7 5.5*  ALBUMIN 2.1* 1.8*   No results for input(s): LIPASE, AMYLASE in the last 168 hours. No results for input(s): AMMONIA in the last 168 hours. Coagulation profile Recent Labs  Lab 10/25/20 2151  INR 1.3*    CBC: Recent Labs  Lab 10/26/20 0216 10/26/20 0825 10/26/20 0914 10/27/20 0056 10/28/20 0335 10/29/20 0345 10/30/20 0258  WBC 9.9  --   --  15.0* 15.5* 14.9* 12.9*  HGB 10.2*   < > 10.2* 10.8* 10.2* 10.6* 10.5*  HCT 32.2*   < > 30.0* 33.6* 33.0* 33.3* 31.9*  MCV 91.5  --   --  87.7 89.7 88.3 87.6  PLT 181  --   --  498* 533* 435* 377   < > = values in this interval not displayed.   Cardiac Enzymes: No results for input(s): CKTOTAL, CKMB, CKMBINDEX, TROPONINI in the last 168 hours. BNP (last 3  results) No results for input(s): PROBNP in the last 8760 hours. CBG: Recent Labs  Lab 10/29/20 1630 10/29/20 2022 10/29/20 2312 10/30/20 0426 10/30/20 0745  GLUCAP 113* 148* 97 103* 141*   D-Dimer: No results for input(s): DDIMER in the last 72 hours. Hgb A1c: No results for input(s): HGBA1C in the last 72 hours. Lipid Profile: No results for input(s): CHOL, HDL, LDLCALC, TRIG, CHOLHDL, LDLDIRECT in the last 72 hours. Thyroid function studies: No results for input(s): TSH, T4TOTAL, T3FREE, THYROIDAB in the last 72 hours.  Invalid input(s): FREET3 Anemia work up: No results for input(s): VITAMINB12, FOLATE, FERRITIN, TIBC, IRON, RETICCTPCT in the last 72 hours. Sepsis Labs: Recent Labs  Lab 10/25/20 2151 10/26/20 0216 10/27/20 0056 10/28/20 0335 10/29/20 0345 10/30/20 0258  WBC  --    < > 15.0* 15.5* 14.9* 12.9*  LATICACIDVEN 1.3  --   --   --   --   --    < > = values in this interval not displayed.    Microbiology Recent Results (from the past 240 hour(s))  Culture, blood (routine x 2)     Status: None   Collection Time: 10/25/20 12:48 PM   Specimen: BLOOD  Result Value Ref Range Status   Specimen Description BLOOD BLOOD LEFT FOREARM  Final   Special Requests   Final    BOTTLES DRAWN AEROBIC AND ANAEROBIC Blood Culture adequate volume   Culture   Final    NO GROWTH 5 DAYS Performed at [facility], [address], [place], [place] [address]    Report Status 10/30/2020 FINAL  Final  Culture, blood (routine x 2)     Status: None   Collection Time: 10/25/20 12:53 PM   Specimen: BLOOD  Result Value Ref Range Status   Specimen Description BLOOD RIGHT ANTECUBITAL  Final   Special Requests   Final    BOTTLES DRAWN AEROBIC AND ANAEROBIC Blood Culture adequate volume   Culture   Final    NO GROWTH 5 DAYS Performed at [facility], [place] [address], [place], [place] [address]    Report Status 10/30/2020 FINAL  Final  Resp Panel by RT-PCR (Flu A&B,  Covid) Nasopharyngeal Swab     Status: None   Collection Time: 10/25/20  1:12 PM   Specimen: Nasopharyngeal Swab; Nasopharyngeal(NP) swabs in vial transport medium  Result Value Ref Range Status   SARS Coronavirus 2 by RT PCR NEGATIVE NEGATIVE Final  Comment: (NOTE) SARS-CoV-2 target nucleic acids are NOT DETECTED.  The SARS-CoV-2 RNA is generally detectable in upper respiratory specimens during the acute phase of infection. The lowest concentration of SARS-CoV-2 viral copies this assay can detect is 138 copies/mL. A negative result does not preclude SARS-Cov-2 infection and should not be used as the sole basis for treatment or other patient management decisions. A negative result may occur with  improper specimen collection/handling, submission of specimen other than nasopharyngeal swab, presence of viral mutation(s) within the areas targeted by this assay, and inadequate number of viral copies(<138 copies/mL). A negative result must be combined with clinical observations, patient history, and epidemiological information. The expected result is Negative.  Fact Sheet for Patients:  EntrepreneurPulse.com.au  Fact Sheet for Healthcare Providers:  IncredibleEmployment.be  This test is no t yet approved or cleared by the [place] FDA and  has been authorized for detection and/or diagnosis of SARS-CoV-2 by FDA under an Emergency Use Authorization (EUA). This EUA will remain  in effect (meaning this test can be used) for the duration of the COVID-19 declaration under Section 564(b)(1) of the Act, 21 U.S.C.section 360bbb-3(b)(1), unless the authorization is terminated  or revoked sooner.       Influenza A by PCR NEGATIVE NEGATIVE Final   Influenza B by PCR NEGATIVE NEGATIVE Final    Comment: (NOTE) The Xpert Xpress SARS-CoV-2/FLU/RSV plus assay is intended as an aid in the diagnosis of influenza from Nasopharyngeal swab specimens and should  not be used as a sole basis for treatment. Nasal washings and aspirates are unacceptable for Xpert Xpress SARS-CoV-2/FLU/RSV testing.  Fact Sheet for Patients: EntrepreneurPulse.com.au  Fact Sheet for Healthcare Providers: IncredibleEmployment.be  This test is not yet approved or cleared by the [place] FDA and has been authorized for detection and/or diagnosis of SARS-CoV-2 by FDA under an Emergency Use Authorization (EUA). This EUA will remain in effect (meaning this test can be used) for the duration of the COVID-19 declaration under Section 564(b)(1) of the Act, 21 U.S.C. section 360bbb-3(b)(1), unless the authorization is terminated or revoked.  Performed at [facility], [place] [address] [place], [place] [address]   Body fluid culture     Status: None   Collection Time: 10/26/20  8:31 AM   Specimen: PATH Other; Body Fluid  Result Value Ref Range Status   Specimen Description PLEURAL  Final   Special Requests A LEFT PLEURAL  Final   Gram Stain   Final    ABUNDANT WBC PRESENT,BOTH PMN AND MONONUCLEAR MODERATE GRAM POSITIVE COCCI Performed at [facility], [place] [address], [place],  [address]    Culture MODERATE STREPTOCOCCUS INTERMEDIUS  Final   Report Status 10/29/2020 FINAL  Final   Organism ID, Bacteria STREPTOCOCCUS INTERMEDIUS  Final      Susceptibility   Streptococcus intermedius - MIC*    PENICILLIN <=0.06 SENSITIVE Sensitive     CEFTRIAXONE 0.25 SENSITIVE Sensitive     ERYTHROMYCIN <=0.12 SENSITIVE Sensitive     LEVOFLOXACIN 0.5 SENSITIVE Sensitive     VANCOMYCIN 0.5 SENSITIVE Sensitive     * MODERATE STREPTOCOCCUS INTERMEDIUS  Acid Fast Smear (AFB)     Status: None   Collection Time: 10/26/20  8:31 AM   Specimen: PATH Other; Body Fluid  Result Value Ref Range Status   AFB Specimen Processing Concentration  Final   Acid Fast Smear Negative  Final    Comment: (NOTE) Performed At: [place], [place]  [id number] Rush Farmer MD [id number]    Source (AFB) PLEURAL  Final    Comment: Performed at [facility], [place] [address], [place], [place] [address]  Culture, fungus without smear     Status: None (Preliminary result)   Collection Time: 10/26/20  8:48 AM   Specimen: PATH Other; Lung  Result Value Ref Range Status   Specimen Description PLEURAL  Final   Special Requests C LT PLEURAL  Final   Culture   Final    NO FUNGUS ISOLATED AFTER 3 DAYS Performed at [facility], [address], [place], [place] [address]    Report Status PENDING  Incomplete  Aerobic/Anaerobic Culture (surgical/deep wound)     Status: None (Preliminary result)   Collection Time: 10/26/20  8:48 AM   Specimen: PATH Other; Lung  Result Value Ref Range Status   Specimen Description PLEURAL  Final   Special Requests C LT PLEURAL  Final   Gram Stain   Final    ABUNDANT WBC PRESENT,BOTH PMN AND MONONUCLEAR RARE GRAM POSITIVE COCCI    Culture   Final    FEW STREPTOCOCCUS INTERMEDIUS SUSCEPTIBILITIES TO FOLLOW Performed at [facility], [place] [address], [place], [place] [address]    Report Status PENDING  Incomplete  Acid Fast Smear (AFB)     Status: None   Collection Time: 10/26/20  8:48 AM   Specimen: PATH Other; Lung  Result Value Ref Range Status   AFB Specimen Processing Concentration  Final   Acid Fast Smear Negative  Final    Comment: (NOTE) Performed At: [facility] [place], [place] [id number] Rush Farmer MD [id number]    Source (AFB) PLEURAL  Final    Comment: Performed at [facility], [place] [address], [place], [place] [address]    Procedures and diagnostic studies:  DG Chest Port 1 View  Result Date: 10/30/2020 CLINICAL DATA:  Chest tube.  Pneumonia. EXAM: PORTABLE CHEST 1 VIEW COMPARISON:  10/29/2020. FINDINGS: Left subclavian line in stable position. Interim removal of lateral most left  chest tube. Two remaining chest tubes in stable position. Very tiny left apical pneumothorax cannot be excluded. Mild left pleural thickening/effusion again noted without interim change. Persistent atelectasis/infiltrate left lung base. Heart size stable. IMPRESSION: 1. Interim removal of lateral most left chest tube. Two remaining chest tubes in stable position. Very tiny left apical pneumothorax cannot be excluded. 2. Mild left pleural thickening/effusion again noted without interim change. Persistent atelectasis/infiltrate left lung base. Electronically Signed   By: Marcello Moores  Register   On: 10/30/2020 07:33   DG Chest Port 1 View  Result Date: 10/29/2020 CLINICAL DATA:  Chest tube.  History of empyema. EXAM: PORTABLE CHEST 1 VIEW COMPARISON:  10/28/2020. FINDINGS: Left subclavian line and multiple left chest tubes in stable position. Stable tiny left pneumothorax and left-sided pleural thickening. Persistent infiltrate left lung base without interim change. Right costophrenic angle incompletely imaged. Degenerative change thoracic spine. IMPRESSION: 1. Left subclavian line and multiple left chest tubes in stable position. Stable tiny left pneumothorax and left-sided pleural thickening. 2. Persistent infiltrate left lung base without interim change. Electronically Signed   By: Marcello Moores  Register   On: 10/29/2020 07:51    Medications:   . acetaminophen  1,000 mg Oral Q6H   Or  . acetaminophen (TYLENOL) oral liquid 160 mg/5 mL  1,000 mg Oral Q6H  . bisacodyl  10 mg Oral Daily  . Chlorhexidine Gluconate Cloth  6 each  Topical Daily  . feeding supplement  237 mL Oral TID WC  . insulin aspart  0-24 Units Subcutaneous Q4H  . senna-docusate  1 tablet Oral QHS   Continuous Infusions: . sodium chloride    . bupivacaine 0.5 % ON-Q pump SINGLE CATH 400 mL    .  ceFAZolin (ANCEF) IV       LOS: 5 days   Geradine Girt  [facility]   How to contact the [facility] Attending or Consulting provider [place] or covering provider during after hours [place], for this patient?  1. Check the care team in [facility] and look for a) attending/consulting TRH provider listed and b) the [facility] team listed 2. Log into [email or web address] and use [place]'s universal password to access. If you do not have the password, please contact the hospital operator. 3. Locate the [facility] provider you are looking for under [facility] and page to a number that you can be directly reached. 4. If you still have difficulty reaching the provider, please page the [facility] (Director on Call) for the Hospitalists listed on amion for assistance.  10/30/2020, 10:29 AM

## 2020-10-31 ENCOUNTER — Inpatient Hospital Stay (HOSPITAL_COMMUNITY): Payer: PPO

## 2020-10-31 LAB — GLUCOSE, CAPILLARY
Glucose-Capillary: 106 mg/dL — ABNORMAL HIGH (ref 70–99)
Glucose-Capillary: 109 mg/dL — ABNORMAL HIGH (ref 70–99)
Glucose-Capillary: 110 mg/dL — ABNORMAL HIGH (ref 70–99)
Glucose-Capillary: 117 mg/dL — ABNORMAL HIGH (ref 70–99)
Glucose-Capillary: 125 mg/dL — ABNORMAL HIGH (ref 70–99)
Glucose-Capillary: 85 mg/dL (ref 70–99)

## 2020-10-31 LAB — AEROBIC/ANAEROBIC CULTURE W GRAM STAIN (SURGICAL/DEEP WOUND)

## 2020-10-31 MED ORDER — SODIUM CHLORIDE 0.9% FLUSH
10.0000 mL | Freq: Two times a day (BID) | INTRAVENOUS | Status: DC
  Administered 2020-10-31 – 2020-11-01 (×3): 10 mL

## 2020-10-31 MED ORDER — SODIUM CHLORIDE 0.9% FLUSH: 10.0000 mL | INTRAVENOUS | Status: DC | PRN

## 2020-10-31 NOTE — Progress Notes (Signed)
Peripherally Inserted Central Catheter Placement  The IV Nurse has discussed with the patient and/or persons authorized to consent for the patient, the purpose of this procedure and the potential benefits and risks involved with this procedure.  The benefits include less needle sticks, lab draws from the catheter, and the patient may be discharged home with the catheter. Risks include, but not limited to, infection, bleeding, blood clot (thrombus formation), and puncture of an artery; nerve damage and irregular heartbeat and possibility to perform a PICC exchange if needed/ordered by physician.  Alternatives to this procedure were also discussed.  Bard Power PICC patient education guide, fact sheet on infection prevention and patient information card has been provided to patient /or left at bedside.    PICC Placement Documentation  PICC Single Lumen 99/24/26 PICC Right Basilic 38 cm 0 cm (Active)  Indication for Insertion or Continuance of Line Home intravenous therapies (PICC only) 10/31/20 1021  Exposed Catheter (cm) 0 cm 10/31/20 1021  Site Assessment Clean;Dry;Intact 10/31/20 1021  Line Status Flushed;Blood return noted;Saline locked 10/31/20 1021  Dressing Type Transparent;Securing device 10/31/20 1021  Dressing Status Clean;Dry;Intact 10/31/20 1021  Antimicrobial disc in place? Yes 10/31/20 1021  Safety Lock Not Applicable 83/41/96 2229  Dressing Change Due 11/07/20 10/31/20 1021       Frances Maywood 10/31/2020, 10:23 AM

## 2020-10-31 NOTE — Plan of Care (Signed)

## 2020-10-31 NOTE — Progress Notes (Signed)
5 Days Post-Op Procedure(s) (LRB): VIDEO ASSISTED THORACOSCOPY (VATS)/EMPYEMA ,INSERTION OF LEFT PLEURAL CHEST TUBES X 3 (Left) DECORTICATION OF LEFT LUNG, APPLICATION OF ON Q-PUMP FOR PAIN MANAGEMENT (Left) Subjective: Doing well Chest tube output still significcant- leace chest tube to water seal No air leak Objective: Vital signs in last 24 hours: Temp:  [97.8 F (36.6 C)-99.1 F (37.3 C)] 98.5 F (36.9 C) (12/22 0411) Pulse Rate:  [68-79] 74 (12/22 0411) Cardiac Rhythm: Normal sinus rhythm (12/22 0702) Resp:  [14-26] 20 (12/22 0411) BP: (122-160)/(82-101) 128/82 (12/22 0411) SpO2:  [92 %-94 %] 92 % (12/22 0411)  Hemodynamic parameters for last 24 hours:   stable Intake/Output from previous day: 12/21 0701 - 12/22 0700 In: 440 [P.O.:240; IV Piggyback:200] Out: 310 [Urine:100; Chest Tube:210] Intake/Output this shift: No intake/output data recorded.       Exam    General- alert and comfortable    Neck- no JVD, no cervical adenopathy palpable, no carotid bruit   Lungs- clear without rales, wheezes   Cor- regular rate and rhythm, no murmur , gallop   Abdomen- soft, non-tender   Extremities - warm, non-tender, minimal edema   Neuro- oriented, appropriate, no focal weakness   Lab Results: Recent Labs    10/29/20 0345 10/30/20 0258  WBC 14.9* 12.9*  HGB 10.6* 10.5*  HCT 33.3* 31.9*  PLT 435* 377   BMET:  Recent Labs    10/29/20 0345 10/30/20 0258  NA 136 137  K 3.8 3.6  CL 96* 99  CO2 29 30  GLUCOSE 84 155*  BUN 10 9  CREATININE 0.70 0.73  CALCIUM 8.5* 8.3*    PT/INR: No results for input(s): LABPROT, INR in the last 72 hours. ABG    Component Value Date/Time   PHART 7.413 10/27/2020 0417   HCO3 25.0 10/27/2020 0417   TCO2 30 10/26/2020 0914   O2SAT 96.8 10/27/2020 0417   CBG (last 3)  Recent Labs    10/30/20 1939 10/31/20 0006 10/31/20 0408  GLUCAP 115* 106* 109*    Assessment/Plan: S/P Procedure(s) (LRB): VIDEO ASSISTED THORACOSCOPY  (VATS)/EMPYEMA ,INSERTION OF LEFT PLEURAL CHEST TUBES X 3 (Left) DECORTICATION OF LEFT LUNG, APPLICATION OF ON Q-PUMP FOR PAIN MANAGEMENT (Left) Leave chest tube today Place PICC and DC central line for home iv Ancef 12 days due to severity   of empyema Hope to remove tube tomorrow, DC home with [facility] Friday  LOS: 6 days    [name] [name] 10/31/2020

## 2020-10-31 NOTE — Progress Notes (Signed)
      [address]       [employer] [address]             [phone]      5 Days Post-Op Procedure(s) (LRB): VIDEO ASSISTED THORACOSCOPY (VATS)/EMPYEMA ,INSERTION OF LEFT PLEURAL CHEST TUBES X 3 (Left) DECORTICATION OF LEFT LUNG, APPLICATION OF ON Q-PUMP FOR PAIN MANAGEMENT (Left) Subjective: Feels okay this morning. No complaints.   Objective: Vital signs in last 24 hours: Temp:  [97.8 F (36.6 C)-99.1 F (37.3 C)] 98.5 F (36.9 C) (12/22 0411) Pulse Rate:  [68-83] 74 (12/22 0411) Cardiac Rhythm: Normal sinus rhythm (12/22 0702) Resp:  [14-26] 20 (12/22 0411) BP: (122-160)/(79-101) 128/82 (12/22 0411) SpO2:  [90 %-94 %] 92 % (12/22 0411)     Intake/Output from previous day: 12/21 0701 - 12/22 0700 In: 440 [P.O.:240; IV Piggyback:200] Out: 310 [Urine:100; Chest Tube:210] Intake/Output this shift: No intake/output data recorded.  General appearance: alert, cooperative and no distress Heart: regular rate and rhythm, S1, S2 normal, no murmur, click, rub or gallop Lungs: clear to auscultation bilaterally Abdomen: soft, non-tender; bowel sounds normal; no masses,  no organomegaly Extremities: extremities normal, atraumatic, no cyanosis or edema Wound: clean and dry  Lab Results: Recent Labs    10/29/20 0345 10/30/20 0258  WBC 14.9* 12.9*  HGB 10.6* 10.5*  HCT 33.3* 31.9*  PLT 435* 377   BMET:  Recent Labs    10/29/20 0345 10/30/20 0258  NA 136 137  K 3.8 3.6  CL 96* 99  CO2 29 30  GLUCOSE 84 155*  BUN 10 9  CREATININE 0.70 0.73  CALCIUM 8.5* 8.3*    PT/INR: No results for input(s): LABPROT, INR in the last 72 hours. ABG    Component Value Date/Time   PHART 7.413 10/27/2020 0417   HCO3 25.0 10/27/2020 0417   TCO2 30 10/26/2020 0914   O2SAT 96.8 10/27/2020 0417   CBG (last 3)  Recent Labs    10/30/20 1939 10/31/20 0006 10/31/20 0408  GLUCAP 115* 106* 109*    Assessment/Plan: S/P Procedure(s) (LRB): VIDEO ASSISTED  THORACOSCOPY (VATS)/EMPYEMA ,INSERTION OF LEFT PLEURAL CHEST TUBES X 3 (Left) DECORTICATION OF LEFT LUNG, APPLICATION OF ON Q-PUMP FOR PAIN MANAGEMENT (Left)  1. Chest tubes: put out 210cc/24 hours.  2. CXR:  Persistent infiltrate left lung base without change.  3. Renal-creatinine 0.70, electrolytes okay 4. H and H 10.5/31.9, stable 5. Blood glucose well controlled 6. WBC 12.9, no recent fevers. Continue Ancef-Strep intermedius grew out of OR cultures. Will need oral vs. IV antibiotics for home.   Plan: One chest tube remains. Might be able to get it out today. CXR in the morning and possibly home. Labs in the morning.     LOS: 6 days    [name] [name] 10/31/2020

## 2020-10-31 NOTE — Progress Notes (Signed)
PHARMACY CONSULT NOTE FOR:  OUTPATIENT  PARENTERAL ANTIBIOTIC THERAPY (OPAT)  Indication: Strep intermedius empyema  Regimen: Cefazolin 2 gm IV Q 8 hours End date: 11/15/20  IV antibiotic discharge orders are pended. To discharging provider:  please sign these orders via discharge navigator,  Select New Orders & click on the button choice - Manage This Unsigned Work.     Thank you for allowing pharmacy to be a part of this patient's care.  Jimmy Footman, PharmD, BCPS, [place] [facility] Clinical Pharmacist Phone: [phone] 10/31/2020, 11:07 AM

## 2020-11-01 ENCOUNTER — Inpatient Hospital Stay (HOSPITAL_COMMUNITY): Payer: PPO

## 2020-11-01 LAB — CBC
HCT: 32.2 % — ABNORMAL LOW (ref 39.0–52.0)
Hemoglobin: 10.6 g/dL — ABNORMAL LOW (ref 13.0–17.0)
MCH: 29 pg (ref 26.0–34.0)
MCHC: 32.9 g/dL (ref 30.0–36.0)
MCV: 88 fL (ref 80.0–100.0)
Platelets: 333 10*3/uL (ref 150–400)
RBC: 3.66 MIL/uL — ABNORMAL LOW (ref 4.22–5.81)
RDW: 13 % (ref 11.5–15.5)
WBC: 11.6 10*3/uL — ABNORMAL HIGH (ref 4.0–10.5)
nRBC: 0 % (ref 0.0–0.2)

## 2020-11-01 LAB — BASIC METABOLIC PANEL
Anion gap: 8 (ref 5–15)
BUN: 10 mg/dL (ref 8–23)
CO2: 31 mmol/L (ref 22–32)
Calcium: 8.3 mg/dL — ABNORMAL LOW (ref 8.9–10.3)
Chloride: 97 mmol/L — ABNORMAL LOW (ref 98–111)
Creatinine, Ser: 0.67 mg/dL (ref 0.61–1.24)
GFR, Estimated: >60 mL/min (ref >60)
Glucose, Bld: 118 mg/dL — ABNORMAL HIGH (ref 70–99)
Potassium: 3.5 mmol/L (ref 3.5–5.1)
Sodium: 136 mmol/L (ref 135–145)

## 2020-11-01 MED ORDER — CEFAZOLIN IV (FOR PTA / DISCHARGE USE ONLY): 2.0000 g | Freq: Three times a day (TID) | INTRAVENOUS | 0 refills | Status: DC

## 2020-11-01 MED ORDER — ENSURE ENLIVE PO LIQD: 237.0000 mL | Freq: Three times a day (TID) | ORAL | 12 refills | Status: DC

## 2020-11-01 MED ORDER — POTASSIUM CHLORIDE CRYS ER 20 MEQ PO TBCR
20.0000 meq | EXTENDED_RELEASE_TABLET | Freq: Two times a day (BID) | ORAL | Status: AC
  Administered 2020-11-01 (×2): 20 meq via ORAL
  Filled 2020-11-01 (×2): qty 1

## 2020-11-01 NOTE — Progress Notes (Addendum)
      [address]       [place], [address]             [phone]      6 Days Post-Op Procedure(s) (LRB): VIDEO ASSISTED THORACOSCOPY (VATS)/EMPYEMA ,INSERTION OF LEFT PLEURAL CHEST TUBES X 3 (Left) DECORTICATION OF LEFT LUNG, APPLICATION OF ON Q-PUMP FOR PAIN MANAGEMENT (Left) Subjective: Feels okay this morning. No complaints.   Objective: Vital signs in last 24 hours: Temp:  [98.5 F (36.9 C)-99.1 F (37.3 C)] 98.6 F (37 C) (12/23 0338) Pulse Rate:  [68-79] 68 (12/23 0338) Cardiac Rhythm: Normal sinus rhythm (12/23 0658) Resp:  [13-21] 20 (12/23 0338) BP: (101-145)/(69-94) 137/83 (12/23 0338) SpO2:  [92 %-94 %] 93 % (12/23 0338)    Intake/Output from previous day: 12/22 0701 - 12/23 0700 In: -  Out: 80 [Chest Tube:80] Intake/Output this shift: No intake/output data recorded.  General appearance: alert, cooperative and no distress Heart: regular rate and rhythm, S1, S2 normal, no murmur, click, rub or gallop Lungs: clear to auscultation bilaterally Abdomen: soft, non-tender; bowel sounds normal; no masses,  no organomegaly Extremities: extremities normal, atraumatic, no cyanosis or edema Wound: clean and dry  Lab Results: Recent Labs    10/30/20 0258 11/01/20 0104  WBC 12.9* 11.6*  HGB 10.5* 10.6*  HCT 31.9* 32.2*  PLT 377 333   BMET:  Recent Labs    10/30/20 0258 11/01/20 0104  NA 137 136  K 3.6 3.5  CL 99 97*  CO2 30 31  GLUCOSE 155* 118*  BUN 9 10  CREATININE 0.73 0.67  CALCIUM 8.3* 8.3*    PT/INR: No results for input(s): LABPROT, INR in the last 72 hours. ABG    Component Value Date/Time   PHART 7.413 10/27/2020 0417   HCO3 25.0 10/27/2020 0417   TCO2 30 10/26/2020 0914   O2SAT 96.8 10/27/2020 0417   CBG (last 3)  Recent Labs    10/31/20 1612 10/31/20 1916 10/31/20 2318  GLUCAP 117* 85 110*    Assessment/Plan: S/P Procedure(s) (LRB): VIDEO ASSISTED THORACOSCOPY (VATS)/EMPYEMA ,INSERTION OF LEFT PLEURAL  CHEST TUBES X 3 (Left) DECORTICATION OF LEFT LUNG, APPLICATION OF ON Q-PUMP FOR PAIN MANAGEMENT (Left)  1. Chest tubes: put out 80cc/24 hours.  2. CXR: Left basilar pneumo. On chest xray.  3. Renal-creatinine 0.67, supplement potassium 4. H and H 10.6/32.2, stable 5. Blood glucose well controlled 6. WBC 11.6, no recent fevers. Continue Ancef-Strep intermediusgrew out of OR cultures. IV abx for 12 days at home. Orders placed yesterday.   Plan: Hopefully can remove last remaining chest tube today, no air leak. Hopeful for home tomorrow if CXR okay.    LOS: 7 days    [name] 11/01/2020  patient examined and medical record reviewed,agree with above note. Discharge instructions including home antibiotics discussed with patient Tharon Aquas Trigt III 11/01/2020

## 2020-11-01 NOTE — Discharge Instructions (Signed)

## 2020-11-01 NOTE — Discharge Summary (Addendum)
[address]       ,[place] [address]             [phone]      Physician Discharge Summary  Patient ID: [name] [name] MRN: [id number] DOB/AGE: [birth year] 64 y.o.  Admit date: 10/25/2020 Discharge date: 11/02/2020  Admission Diagnoses:  Patient Active Problem List   Diagnosis Date Noted   Empyema of pleural space ([place]) 10/26/2020   Pneumonia of left lower lobe due to infectious organism 10/25/2020   Empyema ([place]) 10/25/2020   Normocytic anemia 10/25/2020   AAA (abdominal aortic aneurysm) ([place]) 10/25/2020   Leukocytosis 10/25/2020   Zinc deficiency 03/01/2020   Elevated LFTs 02/24/2020   Iron deficiency anemia due to dietary causes 02/24/2020   Primary osteoarthritis of both hands 03/09/2019   Cannabis abuse 12/13/2018   COPD (chronic obstructive pulmonary disease) with acute bronchitis ([place]) 12/09/2018   LAP-BAND surgery status 02/13/2015   Vitamin D deficiency 05/10/2014   Thiamine deficiency 05/10/2014   Routine general medical examination at a health care facility 05/05/2014   TBI (traumatic brain injury) ([place]) 03/20/2014   Primary insomnia 07/04/2010   Hypothyroidism 04/05/2009   DJD (degenerative joint disease) of knee 05/30/2008   Hyperglycemia 03/26/2007   Hyperlipidemia with target LDL less than 100 03/26/2007   OBESITY 03/26/2007   Essential hypertension 03/26/2007   GERD 03/26/2007    Discharge Diagnoses:  Principal Problem:   Empyema ([place]) Active Problems:   LAP-BAND surgery status   Normocytic anemia   AAA (abdominal aortic aneurysm) ([facility])   Leukocytosis   Empyema of pleural space ([facility])   Discharged Condition: stable   History of Present Illness:     64 year old non-smoker admitted to the ED with left empyema and compressive atelectasis of the left lung by CT scan. The patient was seen in the ED 7 to 10 days ago with flulike symptoms and productive cough.  Chest x-ray showed a left lower lobe pneumonia and he was  placed on oral antibiotics. He did not improve and developed pleuritic chest pain shortness of breath loss of appetite and malaise.  A visit to his primary care physician today with chest x-ray showed opacification of the left hemithorax and he was sent to the ED.  CT scan shows large left empyema with almost complete atelectasis of the left lung.  There is no pericardial effusion.   Today his white count is 11,000 and hemoglobin 11.3.  He is afebrile and nontoxic but very uncomfortable.  The patient is being admitted for planned left VATS and drainage of empyema and decortication of left lung in the morning.   The patient had a previous lap band operation 8 years ago and tolerated that operation and anesthesia well.  He has no cardiac history.  He has no history of thoracic trauma.  He is allergic to penicillin.  He lives with his wife and is currently helping assist his sister who has small cell lung cancer.  Hospital course: Due to the findings on CT scan and studies Dr. Darcey Nora recommended video-assisted thoracoscopic surgery and on 10/26/2020 he underwent a left VATS for drainage of large empyema and decortication of the left lung.  He tolerated it well was taken to the postanesthesia care unit in stable condition  Postoperative hospital course: Patient is making adequate progress.  He has 3 chest tubes in place and they continue to drain.  It is slowing over time.  Will probably begin to discontinue tubes  starting on 10/29/2020.  He has made good progress with his pulmonary status overall and oxygen has been weaned.  He has remained hemodynamically stable.  He has been on both vancomycin and cefepime and white blood cell count is stabilized as of 10/28/2020 at 15,000.  Incisions are noted to be healing without evidence of infection.  He does have some protein calorie malnutrition with an albumin level 1.8.  We are pushing aggressive pulmonary toilet and mobilization as able. One of his chest tubes  were removed on 12/20. He remained on antibiotics for treatment of his empyema. His angled chest tube was removed on 12/21. He continued to drain white purulent drainage. His last chest tube was removed on 12/23 and his drainage decreased significantly. His CXR continued to improve. A repeat chest xray was performed on 12/24 which showed stable aeration, no significant recurrence of effusion, no significant PTX. Today, he is tolerating room air, his incisions are healing well, he is ambulating without assistance, and he is ready for discharge home.    Consults: None  Significant Diagnostic Studies:   CLINICAL DATA:  Pneumothorax with chest tube in place   EXAM: PORTABLE CHEST 1 VIEW   COMPARISON:  October 31, 2020   FINDINGS: Chest tube remains on the left. Left basilar pneumothorax is marginally larger than 1 day prior without tension component. No other evident pneumothorax. Central catheter tip is in the superior vena cava. There are areas of atelectatic change in the left mid lung and left base. Lungs elsewhere clear. Heart size and pulmonary vascularity are normal. No adenopathy. There is degenerative change in the thoracic spine.   IMPRESSION: Tube and catheter positions as described. Left base pneumothorax marginally larger than 1 day prior without tension component. Scattered areas of atelectatic change noted on the left. Lungs elsewhere clear. Stable cardiac silhouette.     Electronically Signed   By: Lowella Grip III M.D.   On: 11/01/2020 08:05   Treatments:  NAME: Medine, [place] [id number] ACCOUNT [id number] DATE OF BIRTH:1956/01/04 FACILITY: [facility] LOCATION: [facility] PHYSICIAN:PETER VAN TRIGT III, MD   OPERATIVE REPORT   DATE OF PROCEDURE:  10/26/2020   OPERATION: 1.  Left VATS for drainage of large empyema and decortication of left lung. 2.  Placement of On-Q wound analgesia system.   SURGEON:  Ivin Poot, MD   ASSISTANT:   Nicholes Rough, PA-C.   PREOPERATIVE DIAGNOSES:  Left lower lobe pneumonia, followed by a large left empyema with atelectasis of the left lung.   POSTOPERATIVE DIAGNOSES:  Left lower lobe pneumonia, followed by a large left empyema with atelectasis of the left lung.   ANESTHESIA:  General.   CLINICAL NOTE:  The patient is a 64 year old nonsmoker, who developed left lower lobe pneumonia, was seen in an outpatient setting for chest x-ray and received a course of oral azithromycin.  His symptoms progressed to pleuritic pain, shortness of  breath, weakness and malaise with poor appetite.  He presented to his primary care office and a followup chest x-ray showed complete opacification of the left hemithorax and he was sent to the ED.  A CT scan was performed showing a large loculated  empyema with almost total atelectasis of the left lung.  He was admitted to the hospital for thoracic surgical therapy of the empyema and continued IV antibiotics.  He was not toxic or with systemic signs of sepsis.   I discussed the procedure of left VATS for drainage of empyema and decortication of  the lung with the patient.  He understood the details of the surgery, the expected postoperative hospital recovery and the risks of bleeding, transfusion, dysfunction of  the reexpanded left lung, organ failure, death.  He agreed to proceed with surgery under what I felt was an informed consent.   OPERATIVE FINDINGS: 1.  Four liters of dark milky fluid from the left hemithorax, fluid sent for cultures and cytology. 2.  Purulent fibrinous exudate on the visceral and parietal pleural surfaces, which were cleaned off and sent for culture and pathology. 3.  Peel on the left lung of fibrotic scar-like material, which was removed to allow reexpansion of the left upper and lower lobes.   Discharge Exam: Blood pressure 126/81, pulse 91, temperature 97.9 F (36.6 C), temperature source Oral, resp. rate 20, SpO2 93 %.   General  appearance: alert, cooperative and no distress Heart: regular rate and rhythm Lungs: clear to auscultation bilaterally Abdomen: soft, non-tender; bowel sounds normal; no masses,  no organomegaly Extremities: extremities normal, atraumatic, no cyanosis or edema Wound: the left chest incision is clean and dry, CT exit site is covered with a dry dressing.   Disposition:    Discharge Instructions     [facility] pharmacist to adjust dose for Vancomycin, Aminoglycosides and other anti-infective therapies as requested by physician.   Complete by: As directed    [facility] to provide Cath Flo 91m   Complete by: As directed    Administer for PICC line occlusion and as ordered by physician for other access device issues.   Anaphylaxis Kit: Provided to treat any anaphylactic reaction to the medication being provided to the patient if First Dose or when requested by physician   Complete by: As directed    Epinephrine 149mml vial / amp: Administer 0.16m616m0.16ml60mubcutaneously once for moderate to severe anaphylaxis, nurse to call physician and pharmacy when reaction occurs and call 911 if needed for immediate care   Diphenhydramine 50mg44mIV vial: Administer 25-50mg 44mM PRN for first dose reaction, rash, itching, mild reaction, nurse to call physician and pharmacy when reaction occurs   Sodium Chloride 0.9% NS 500ml I66mdminister if needed for hypovolemic blood pressure drop or as ordered by physician after call to physician with anaphylactic reaction   Change dressing on IV access line weekly and PRN   Complete by: As directed    Flush IV access with Sodium Chloride 0.9% and Heparin 10 units/ml or 100 units/ml   Complete by: As directed    Home infusion instructions - [facility]   Complete by: As directed    Instructions: Flush IV access with Sodium Chloride 0.9% and Heparin 10units/ml or 100units/ml   Change dressing on IV access line: Weekly and PRN    Instructions Cath Flo 2mg: Ad69mister for PICC Line occlusion and as ordered by physician for other access device   [facility] pharmacist to adjust dose for: Vancomycin, Aminoglycosides and other anti-infective therapies as requested by physician   Method of administration may be changed at the discretion of home infusion pharmacist based upon assessment of the patient and/or caregiver's ability to self-administer the medication ordered   Complete by: As directed       Allergies as of 11/02/2020       Reactions   Penicillins Anaphylaxis   Tolerated cefazolin/cefepime 10/2020 Did it involve swelling of the face/tongue/throat, SOB, or low BP? Y Did it involve sudden or severe rash/hives, skin peeling, or any reaction on  the inside of your mouth or nose? Y Did you need to seek medical attention at a hospital or doctor's office? Y When did it last happen?   Childhood    If all above answers are "NO", may proceed with cephalosporin use.   Codeine Nausea And Vomiting        Medication List     TAKE these medications    acetaminophen 650 MG CR tablet Commonly known as: TYLENOL Take 650 mg by mouth every 8 (eight) hours as needed for pain.   ceFAZolin  IVPB Commonly known as: ANCEF Inject 2 g into the vein every 8 (eight) hours for 14 days. Indication:  Empyema First Dose: Yes Last Day of Therapy:  11/15/20 Labs - Once weekly:  CBC/D and BMP, Labs - Every other week:  ESR and CRP Method of administration: IV Push Method of administration may be changed at the discretion of home infusion pharmacist based upon assessment of the patient and/or caregiver's ability to self-administer the medication ordered.   cholecalciferol 25 MCG (1000 UNIT) tablet Commonly known as: VITAMIN D3 Take 1,000 Units by mouth daily.   [employer] Zinc Gluconate 50 MG tablet Generic drug: zinc gluconate TAKE 1 TABLET (50 MG TOTAL) BY MOUTH EVERY OTHER DAY. What changed: See the new instructions.    feeding supplement Liqd Take 237 mLs by mouth 3 (three) times daily with meals.   multivitamin tablet Take 1 tablet by mouth daily.   tadalafil 20 MG tablet Commonly known as: CIALIS TAKE 1 TABLET BY MOUTH EVERY DAY AS NEEDED FOR ERECTILE DYSFUNCTION   traMADol 50 MG tablet Commonly known as: ULTRAM Take 1-2 tablets (50-100 mg total) by mouth every 6 (six) hours as needed for up to 5 days (mild pain).   triazolam 0.25 MG tablet Commonly known as: HALCION TAKE 1 TABLET (0.25 MG TOTAL) BY MOUTH AT BEDTIME AS NEEDED. What changed: when to take this   Turmeric 450 MG Caps Take 1 capsule by mouth 2 (two) times daily with a meal.               Discharge Care Instructions  (From admission, onward)           Start     Ordered   11/01/20 0000  Change dressing on IV access line weekly and PRN  (Home infusion instructions - [facility] )        11/01/20 0947            Follow-up Information     Janith Lima, MD. Call in 1 day(s).   Specialty: Internal Medicine Contact information: [place] [address] [phone]         Ivin Poot, MD Follow up.   Specialty: Cardiothoracic Surgery Why: Your appointment is on 11/21/2020 at 1:00pm. Please arrive at 12:30pm for a chest xray which is located at [facility] which is on the first floor of our building.  Contact information: [address] [place] [address] [phone]                 Signed: Antony Odea, PA-C 11/02/2020, 7:35 AM  patient examined and medical record reviewed,agree with above note. Tharon Aquas Trigt III 11/03/2020

## 2020-11-02 ENCOUNTER — Inpatient Hospital Stay (HOSPITAL_COMMUNITY): Payer: PPO

## 2020-11-02 MED ORDER — TRAMADOL HCL 50 MG PO TABS: 50.0000 mg | ORAL_TABLET | Freq: Four times a day (QID) | ORAL | 0 refills | Status: AC | PRN

## 2020-11-02 NOTE — TOC Transition Note (Signed)
Transition of Care [facility]) - CM/SW Discharge Note   Patient Details  Name: [name] [name] MRN: [id number] Date of Birth: 12-16-1955  [facility] of [facility]) CM/SW Contact:  Sharin Mons, RN Phone Number: 11/02/2020, 8:41 AM   Clinical Narrative:    Patient will DC DD:UKGU Anticipated DC date: 11/02/2020 Family notified: yes, wife Transport by: car   Per MD patient ready for DC today . RN, patient,and patient's family notified of DC.  Start of care with home infusion today @ 12n by [facility].  Post hospital f/u noted on AVS . Pt without Rx med concerns or affordability.   Irvan Tiedt (Spouse)     [phone]       RNCM will sign off for now as intervention is no longer needed. Please consult [place] again if new needs arise.    Final next level of care: [place] Barriers to Discharge: No Barriers Identified   Patient Goals and CMS Choice Patient states their goals for this hospitalization and ongoing recovery are:: get better CMS Medicare.gov Compare Post Acute Care list provided to:: Patient Choice offered to / list presented to : Patient  Discharge Placement                       Discharge Plan and Services                  DME Agency: NA       HH Arranged: RN,IV Antibiotics HH Agency:  [facility]) Date HH Agency Contacted: 10/30/20 Time [employer]: [place] Representative spoke with at [place]: [place] [facility] and [place] for [facility]  Social Determinants of Health (SDOH) Interventions     Readmission Risk Interventions No flowsheet data found.

## 2020-11-02 NOTE — Progress Notes (Addendum)
      [address]       [place] [address]             [phone]      7 Days Post-Op Procedure(s) (LRB): VIDEO ASSISTED THORACOSCOPY (VATS)/EMPYEMA ,INSERTION OF LEFT PLEURAL CHEST TUBES X 3 (Left) DECORTICATION OF LEFT LUNG, APPLICATION OF ON Q-PUMP FOR PAIN MANAGEMENT (Left) Subjective: Sitting up eating breakfast. Says he feels good, no complaints or concerns. Anxious to go home.   Objective: Vital signs in last 24 hours: Temp:  [98.2 F (36.8 C)-98.7 F (37.1 C)] 98.2 F (36.8 C) (12/24 0544) Pulse Rate:  [72-87] 72 (12/24 0544) Cardiac Rhythm: Normal sinus rhythm (12/24 0700) Resp:  [16-22] 19 (12/24 0544) BP: (117-136)/(62-94) 136/94 (12/24 0544) SpO2:  [92 %-96 %] 93 % (12/24 0544)    Intake/Output from previous day: 12/23 0701 - 12/24 0700 In: -  Out: 30 [Chest Tube:30] Intake/Output this shift: No intake/output data recorded.  General appearance: alert, cooperative and no distress Heart: regular rate and rhythm Lungs: clear to auscultation bilaterally Abdomen: soft, non-tender; bowel sounds normal; no masses,  no organomegaly Extremities: extremities normal, atraumatic, no cyanosis or edema Wound: the left chest incision is clean and dry, CT exit site is covered with a dry dressing.   Lab Results: Recent Labs    11/01/20 0104  WBC 11.6*  HGB 10.6*  HCT 32.2*  PLT 333   BMET:  Recent Labs    11/01/20 0104  NA 136  K 3.5  CL 97*  CO2 31  GLUCOSE 118*  BUN 10  CREATININE 0.67  CALCIUM 8.3*    PT/INR: No results for input(s): LABPROT, INR in the last 72 hours. ABG    Component Value Date/Time   PHART 7.413 10/27/2020 0417   HCO3 25.0 10/27/2020 0417   TCO2 30 10/26/2020 0914   O2SAT 96.8 10/27/2020 0417   CBG (last 3)  Recent Labs    10/31/20 1612 10/31/20 1916 10/31/20 2318  GLUCAP 117* 85 110*    Assessment/Plan: S/P Procedure(s) (LRB): VIDEO ASSISTED THORACOSCOPY (VATS)/EMPYEMA ,INSERTION OF LEFT PLEURAL  CHEST TUBES X 3 (Left) DECORTICATION OF LEFT LUNG, APPLICATION OF ON Q-PUMP FOR PAIN MANAGEMENT (Left)  1. POD-7 left VATS, drainage of empyema. Chest tubes out, resp status stable.  2. CXR: Left basilar pneumo appears to be dissipating.  3. Renal-creatinine 0.67, supplement potassium 4. H and H 10.6/32.2, stable 5. Blood glucose well controlled 6. WBC 11.6, no recent fevers. Continue Ancef-Strep intermediusgrew out of OR cultures. IV abx for 12 days at home.   Plan: Discharge to home today with PICC in place for home IV ABX therapy for 12 days.    LOS: 8 days    Antony Odea  [phone] 11/02/2020  patient examined and medical record reviewed,agree with above note.Todays CXR looks good, incision clean,dry PICC in place for IV [facility] by [facility] for  severe streptococcal empyema DC intructions on wound care, activity limits reviewed with patient Tharon Aquas Trigt III 11/02/2020

## 2020-11-05 ENCOUNTER — Telehealth: Payer: Self-pay

## 2020-11-05 NOTE — Telephone Encounter (Signed)
Transition Care Management Follow-up Telephone Call  Date of discharge and from where: 11/02/2020 from [facility]  How have you been since you were released from the hospital? Very tired, but getting better  Any questions or concerns? No  Items Reviewed:  Did the pt receive and understand the discharge instructions provided? Yes   Medications obtained and verified? Yes   Other? No   Any new allergies since your discharge? No   Dietary orders reviewed? No  Do you have support at home? Yes  wife  Home Care and Equipment/Supplies: Were home health services ordered? yes If so, what is the name of the agency? [facility]  Has the agency set up a time to come to the patient's home? yes Were any new equipment or medical supplies ordered?  Yes: Anaphylaxis Kit, PICC line occlusion, IV access line What is the name of the medical supply agency? [facility] Were you able to get the supplies/equipment? yes Do you have any questions related to the use of the equipment or supplies? No  Functional Questionnaire: (I = Independent and D = Dependent) ADLs: Independent or Dependent  Bathing/Dressing- I  Meal Prep- I  Eating- I  Maintaining continence- I  Transferring/Ambulation- I  Managing Meds- I  Follow up appointments reviewed:   PCP Hospital f/u appt confirmed? Yes  Scheduled to see Scarlette Calico, MD on 11/13/2020 @ 8:40 am.  [place] Hospital f/u appt confirmed? Yes  Scheduled to see Ivin Poot III, MD on 11/21/2020 @ 1:00 pm.  Are transportation arrangements needed? No   If their condition worsens, is the pt aware to call PCP or go to the Emergency Dept.? Yes  Was the patient provided with contact information for the PCP's office or ED? Yes  Was to pt encouraged to call back with questions or concerns? Yes

## 2020-11-06 DIAGNOSIS — D72829 Elevated white blood cell count, unspecified: Secondary | ICD-10-CM | POA: Diagnosis not present

## 2020-11-06 DIAGNOSIS — J189 Pneumonia, unspecified organism: Secondary | ICD-10-CM | POA: Diagnosis not present

## 2020-11-06 DIAGNOSIS — D649 Anemia, unspecified: Secondary | ICD-10-CM | POA: Diagnosis not present

## 2020-11-06 DIAGNOSIS — J869 Pyothorax without fistula: Secondary | ICD-10-CM | POA: Diagnosis not present

## 2020-11-10 DIAGNOSIS — J869 Pyothorax without fistula: Secondary | ICD-10-CM | POA: Diagnosis not present

## 2020-11-10 DIAGNOSIS — J189 Pneumonia, unspecified organism: Secondary | ICD-10-CM | POA: Diagnosis not present

## 2020-11-13 ENCOUNTER — Encounter: Payer: Self-pay | Admitting: Internal Medicine

## 2020-11-13 ENCOUNTER — Ambulatory Visit (INDEPENDENT_AMBULATORY_CARE_PROVIDER_SITE_OTHER): Payer: PPO | Admitting: Internal Medicine

## 2020-11-13 ENCOUNTER — Ambulatory Visit (INDEPENDENT_AMBULATORY_CARE_PROVIDER_SITE_OTHER): Payer: PPO

## 2020-11-13 ENCOUNTER — Other Ambulatory Visit: Payer: Self-pay

## 2020-11-13 VITALS — BP 136/84 | HR 86 | Temp 98.5°F | Resp 16 | Ht 74.0 in | Wt 186.0 lb

## 2020-11-13 DIAGNOSIS — I10 Essential (primary) hypertension: Secondary | ICD-10-CM | POA: Diagnosis not present

## 2020-11-13 DIAGNOSIS — J189 Pneumonia, unspecified organism: Secondary | ICD-10-CM

## 2020-11-13 DIAGNOSIS — D539 Nutritional anemia, unspecified: Secondary | ICD-10-CM | POA: Diagnosis not present

## 2020-11-13 DIAGNOSIS — D649 Anemia, unspecified: Secondary | ICD-10-CM | POA: Diagnosis not present

## 2020-11-13 DIAGNOSIS — R739 Hyperglycemia, unspecified: Secondary | ICD-10-CM | POA: Diagnosis not present

## 2020-11-13 DIAGNOSIS — R7989 Other specified abnormal findings of blood chemistry: Secondary | ICD-10-CM | POA: Diagnosis not present

## 2020-11-13 DIAGNOSIS — E60 Dietary zinc deficiency: Secondary | ICD-10-CM

## 2020-11-13 DIAGNOSIS — Z23 Encounter for immunization: Secondary | ICD-10-CM

## 2020-11-13 DIAGNOSIS — J90 Pleural effusion, not elsewhere classified: Secondary | ICD-10-CM | POA: Diagnosis not present

## 2020-11-13 DIAGNOSIS — J9811 Atelectasis: Secondary | ICD-10-CM | POA: Diagnosis not present

## 2020-11-13 DIAGNOSIS — D72829 Elevated white blood cell count, unspecified: Secondary | ICD-10-CM | POA: Diagnosis not present

## 2020-11-13 DIAGNOSIS — I712 Thoracic aortic aneurysm, without rupture, unspecified: Secondary | ICD-10-CM

## 2020-11-13 DIAGNOSIS — E039 Hypothyroidism, unspecified: Secondary | ICD-10-CM

## 2020-11-13 LAB — TSH: TSH: 4.64 u[IU]/mL — ABNORMAL HIGH (ref 0.35–4.50)

## 2020-11-13 LAB — BASIC METABOLIC PANEL
BUN: 22 mg/dL (ref 6–23)
CO2: 29 mEq/L (ref 19–32)
Calcium: 8.8 mg/dL (ref 8.4–10.5)
Chloride: 103 mEq/L (ref 96–112)
Creatinine, Ser: 0.75 mg/dL (ref 0.40–1.50)
GFR: 94.78 mL/min (ref >60.00)
Glucose, Bld: 98 mg/dL (ref 70–99)
Potassium: 4.2 mEq/L (ref 3.5–5.1)
Sodium: 140 mEq/L (ref 135–145)

## 2020-11-13 LAB — CBC WITH DIFFERENTIAL/PLATELET
Basophils Absolute: 0 10*3/uL (ref 0.0–0.1)
Basophils Relative: 0.5 % (ref 0.0–3.0)
Eosinophils Absolute: 0.1 10*3/uL (ref 0.0–0.7)
Eosinophils Relative: 1.4 % (ref 0.0–5.0)
HCT: 33.2 % — ABNORMAL LOW (ref 39.0–52.0)
Hemoglobin: 11 g/dL — ABNORMAL LOW (ref 13.0–17.0)
Lymphocytes Relative: 21.1 % (ref 12.0–46.0)
Lymphs Abs: 1.9 10*3/uL (ref 0.7–4.0)
MCHC: 33.1 g/dL (ref 30.0–36.0)
MCV: 87 fl (ref 78.0–100.0)
Monocytes Absolute: 0.7 10*3/uL (ref 0.1–1.0)
Monocytes Relative: 7.6 % (ref 3.0–12.0)
Neutro Abs: 6.1 10*3/uL (ref 1.4–7.7)
Neutrophils Relative %: 69.4 % (ref 43.0–77.0)
Platelets: 275 10*3/uL (ref 150.0–400.0)
RBC: 3.82 Mil/uL — ABNORMAL LOW (ref 4.22–5.81)
RDW: 14.9 % (ref 11.5–15.5)
WBC: 8.8 10*3/uL (ref 4.0–10.5)

## 2020-11-13 LAB — HEPATIC FUNCTION PANEL
ALT: 5 U/L (ref 0–53)
AST: 23 U/L (ref 0–37)
Albumin: 3.6 g/dL (ref 3.5–5.2)
Alkaline Phosphatase: 82 U/L (ref 39–117)
Bilirubin, Direct: 0.1 mg/dL (ref 0.0–0.3)
Total Bilirubin: 0.3 mg/dL (ref 0.2–1.2)
Total Protein: 7 g/dL (ref 6.0–8.3)

## 2020-11-13 LAB — HEMOGLOBIN A1C: Hgb A1c MFr Bld: 6.1 % (ref 4.6–6.5)

## 2020-11-13 LAB — FERRITIN: Ferritin: 206.2 ng/mL (ref 22.0–322.0)

## 2020-11-13 LAB — FOLATE: Folate: 7.7 ng/mL (ref >5.9)

## 2020-11-13 LAB — VITAMIN B12: Vitamin B-12: 434 pg/mL (ref 211–911)

## 2020-11-13 LAB — PROTIME-INR
INR: 1 ratio (ref 0.8–1.0)
Prothrombin Time: 11.3 s (ref 9.6–13.1)

## 2020-11-13 NOTE — Progress Notes (Signed)
Subjective:  Patient ID: [name] [name], male    DOB: 1956/03/23  Age: 65 y.o. MRN: [id number]  CC: Follow-up and Anemia  This visit occurred during the SARS-CoV-2 public health emergency.  Safety protocols were in place, including screening questions prior to the visit, additional usage of staff PPE, and extensive cleaning of exam room while observing appropriate contact time as indicated for disinfecting solutions.    HPI MAESON PUROHIT presents for a hosp. F/up - He was recently admitted for treatment of left lower lobe pneumonia with empyema.  He had a chest tube placed and is doing better.  His only complaint at this time is that he has minimal discomfort over the left lower anterior rib cage.  He denies cough, fever, chills, hemoptysis, abdominal pain, night sweats, or lymphadenopathy.  During the admission he was anemic and had abnormal liver enzymes.  Admit date: 10/25/2020 Discharge date: 11/02/2020  Admission Diagnoses:      Patient Active Problem List   Diagnosis Date Noted  . Empyema of pleural space ([place]) 10/26/2020  . Pneumonia of left lower lobe due to infectious organism 10/25/2020  . Empyema ([place]) 10/25/2020  . Normocytic anemia 10/25/2020  . AAA (abdominal aortic aneurysm) ([place]) 10/25/2020  . Leukocytosis 10/25/2020  . Zinc deficiency 03/01/2020  . Elevated LFTs 02/24/2020  . Iron deficiency anemia due to dietary causes 02/24/2020  . Primary osteoarthritis of both hands 03/09/2019  . Cannabis abuse 12/13/2018  . COPD (chronic obstructive pulmonary disease) with acute bronchitis ([place]) 12/09/2018  . LAP-BAND surgery status 02/13/2015  . Vitamin D deficiency 05/10/2014  . Thiamine deficiency 05/10/2014  . Routine general medical examination at a health care facility 05/05/2014  . TBI (traumatic brain injury) ([place]) 03/20/2014  . Primary insomnia 07/04/2010  . Hypothyroidism 04/05/2009  . DJD (degenerative joint disease) of knee 05/30/2008  . Hyperglycemia  03/26/2007  . Hyperlipidemia with target LDL less than 100 03/26/2007  . OBESITY 03/26/2007  . Essential hypertension 03/26/2007  . GERD 03/26/2007    Discharge Diagnoses:  Principal Problem:   Empyema [facility]) Active Problems:   LAP-BAND surgery status   Normocytic anemia   AAA (abdominal aortic aneurysm) ([facility])   Leukocytosis   Empyema of pleural space ([facility])     Outpatient Medications Prior to Visit  Medication Sig Dispense Refill  . acetaminophen (TYLENOL) 650 MG CR tablet Take 650 mg by mouth every 8 (eight) hours as needed for pain.    Marland Kitchen ceFAZolin (ANCEF) IVPB Inject 2 g into the vein every 8 (eight) hours for 14 days. Indication:  Empyema First Dose: Yes Last Day of Therapy:  11/15/20 Labs - Once weekly:  CBC/D and BMP, Labs - Every other week:  ESR and CRP Method of administration: IV Push Method of administration may be changed at the discretion of home infusion pharmacist based upon assessment of the patient and/or caregiver's ability to self-administer the medication ordered. 42 Units 0  . cholecalciferol (VITAMIN D3) 25 MCG (1000 UNIT) tablet Take 1,000 Units by mouth daily.    . CVS ZINC GLUCONATE 50 MG tablet TAKE 1 TABLET (50 MG TOTAL) BY MOUTH EVERY OTHER DAY. (Patient taking differently: Take 50 mg by mouth every other day.) 45 tablet 1  . feeding supplement (ENSURE ENLIVE / ENSURE PLUS) LIQD Take 237 mLs by mouth 3 (three) times daily with meals. 237 mL 12  . Multiple Vitamin (MULTIVITAMIN) tablet Take 1 tablet by mouth daily.    . tadalafil (CIALIS) 20 MG tablet  TAKE 1 TABLET BY MOUTH EVERY DAY AS NEEDED FOR ERECTILE DYSFUNCTION 20 tablet 5  . triazolam (HALCION) 0.25 MG tablet TAKE 1 TABLET (0.25 MG TOTAL) BY MOUTH AT BEDTIME AS NEEDED. (Patient taking differently: Take 0.25 mg by mouth at bedtime.) 90 tablet 1  . Turmeric 450 MG CAPS Take 1 capsule by mouth 2 (two) times daily with a meal. 180 capsule 3   No facility-administered medications prior to visit.     ROS Review of Systems  Constitutional: Negative for chills, diaphoresis, fatigue and fever.  HENT: Negative.   Respiratory: Negative for cough, chest tightness, shortness of breath and wheezing.   Cardiovascular: Positive for chest pain. Negative for leg swelling.  Gastrointestinal: Negative for abdominal pain, constipation, diarrhea, nausea and vomiting.  Endocrine: Negative for cold intolerance and heat intolerance.  Genitourinary: Negative.  Negative for difficulty urinating, dysuria, hematuria and urgency.  Musculoskeletal: Negative.  Negative for arthralgias, back pain and myalgias.  Skin: Negative.   Neurological: Negative.  Negative for dizziness, weakness, light-headedness and numbness.  Hematological: Negative for adenopathy. Does not bruise/bleed easily.  Psychiatric/Behavioral: Negative.     Objective:  BP 136/84   Pulse 86   Temp 98.5 F (36.9 C) (Oral)   Resp 16   Ht _0  (1.88 m)   Wt 186 lb (84.4 kg)   SpO2 98%   BMI 23.88 kg/m   BP Readings from Last 3 Encounters:  11/13/20 136/84  11/02/20 126/81  10/25/20 138/82    Wt Readings from Last 3 Encounters:  11/13/20 186 lb (84.4 kg)  10/25/20 200 lb (90.7 kg)  10/18/20 188 lb (85.3 kg)    Physical Exam Vitals reviewed.  Constitutional:      General: He is not in acute distress.    Appearance: Normal appearance. He is ill-appearing. He is not toxic-appearing or diaphoretic.  HENT:     Nose: Nose normal.     Mouth/Throat:     Mouth: Mucous membranes are moist.  Eyes:     General: No scleral icterus.    Conjunctiva/sclera: Conjunctivae normal.  Cardiovascular:     Rate and Rhythm: Normal rate and regular rhythm.     Heart sounds: No murmur heard.   Pulmonary:     Effort: Pulmonary effort is normal.     Breath sounds: No stridor. Examination of the left-lower field reveals decreased breath sounds. Decreased breath sounds present. No wheezing, rhonchi or rales.  Chest:     Chest wall: No  mass, swelling or tenderness.  Abdominal:     General: Abdomen is flat.     Palpations: There is no mass.     Tenderness: There is no abdominal tenderness. There is no guarding.  Musculoskeletal:        General: Normal range of motion.     Cervical back: Neck supple.     Right lower leg: No edema.     Left lower leg: No edema.  Lymphadenopathy:     Cervical: No cervical adenopathy.  Skin:    General: Skin is warm and dry.     Coloration: Skin is not pale.  Neurological:     General: No focal deficit present.     Mental Status: He is alert.  Psychiatric:        Mood and Affect: Mood normal.        Behavior: Behavior normal.     Lab Results  Component Value Date   WBC 8.8 11/13/2020   HGB 11.0 (L) 11/13/2020  HCT 33.2 (L) 11/13/2020   PLT 275.0 11/13/2020   GLUCOSE 98 11/13/2020   CHOL 168 02/24/2020   TRIG 135.0 02/24/2020   HDL 48.70 02/24/2020   LDLDIRECT 118.0 05/14/2018   LDLCALC 92 02/24/2020   ALT 5 11/13/2020   AST 23 11/13/2020   NA 140 11/13/2020   K 4.2 11/13/2020   CL 103 11/13/2020   CREATININE 0.75 11/13/2020   BUN 22 11/13/2020   CO2 29 11/13/2020   TSH 4.64 (H) 11/13/2020   PSA 0.39 02/24/2020   INR 1.0 11/13/2020   HGBA1C 6.1 11/13/2020   MICROALBUR 4.2 (H) 12/30/2007    DG Chest 2 View  Result Date: 10/25/2020 CLINICAL DATA:  Chest pain, dyspnea.  Pneumonia. EXAM: CHEST - 2 VIEW COMPARISON:  October 18, 2020. FINDINGS: Interval development of air-fluid level is noted posteriorly in the left upper lobe consistent with moderate to large left hydropneumothorax. There is otherwise nearly complete opacification of the left hemithorax consistent with pneumonia. Right lung is clear. Bony thorax is unremarkable. IMPRESSION: Moderate to large left hydropneumothorax is noted with otherwise nearly complete opacification of left hemithorax consistent with pneumonia. CT scan is recommended for further evaluation. Electronically Signed   By: Marijo Conception  M.D.   On: 10/25/2020 09:16   CT Chest W Contrast  Result Date: 10/25/2020 CLINICAL DATA:  Continued cough and low-grade fever since finishing antibiotics for pneumonia. EXAM: CT CHEST WITH CONTRAST TECHNIQUE: Multidetector CT imaging of the chest was performed during intravenous contrast administration. CONTRAST:  20m OMNIPAQUE IOHEXOL 300 MG/ML  SOLN COMPARISON:  Chest x-ray from same day. CT chest dated September 25, 2011. FINDINGS: Cardiovascular: Normal heart size. No pericardial effusion. Aneurysmal dilatation of the ascending thoracic aorta measuring up to 4.2 cm. No dissection. Coronary, aortic arch, and branch vessel atherosclerotic vascular disease. No pulmonary embolism. Mediastinum/Nodes: Slight rightward mediastinal shift. No enlarged mediastinal, hilar, or axillary lymph nodes. Patulous esophagus. Thyroid gland and trachea demonstrate no significant findings. Lungs/Pleura: Large loculated gas and fluid collection in the left pleural space with pleural thickening and enhancement. The collection essentially fills the entire left hemithorax with severe mass effect on the left lung which is mostly collapsed. The right lung is clear. Upper Abdomen: No acute abnormality. Prior gastric lap band surgery. Musculoskeletal: No chest wall abnormality. No acute or significant osseous findings. Old left-sided rib fractures. IMPRESSION: 1. Large left-sided empyema essentially filling the entire left hemithorax with near complete collapse of the left lung. 2. 4.2 cm ascending aortic aneurysm. Recommend annual imaging followup by CTA or MRA. This recommendation follows 2010 ACCF/AHA/AATS/ACR/ASA/SCA/SCAI/SIR/STS/SVM Guidelines for the Diagnosis and Management of Patients with Thoracic Aortic Disease. Circulation. 2010; 121:: Q676-P950 Aortic aneurysm NOS (ICD10-I71.9) 3. Aortic Atherosclerosis (ICD10-I70.0). Electronically Signed   By: WTitus DubinM.D.   On: 10/25/2020 15:49   DG Chest Port 1 View  Result  Date: 10/26/2020 CLINICAL DATA:  65year old male with a history vats EXAM: PORTABLE CHEST 1 VIEW COMPARISON:  10/25/2020, CT chest 10/26/2019 FINDINGS: Cardiomediastinal silhouette within normal limits, with improved visualization of the left mediastinum and left heart border. Interval resolution of dense opacity of the left hemithorax. Reticulonodular pattern of opacity throughout the left lung persist. Similar appearance of coarsened interstitial markings on the right. Interval placement of 3 thoracostomy tubes, within the sub pulmonic and the left lateral pleural space. Interval placement of left subclavian central venous catheter, with the tip appearing to terminate in the SVC. No pneumothorax. IMPRESSION: Interval surgical changes of VATS and  3 left-sided thoracostomy tubes for treatment of left pleural disease with no visualized pneumothorax and interval resolution of the left pleural fluid. Reticulonodular opacities of the left lung, potentially combination of chronic and acute change. Chronic changes of the right lung. Interval placement of left subclavian central venous catheter with the tip appearing to terminate SVC. Electronically Signed   By: Corrie Mckusick D.O.   On: 10/26/2020 11:17    DG Chest 2 View  Result Date: 11/13/2020 CLINICAL DATA:  Pneumonia EXAM: CHEST - 2 VIEW COMPARISON:  November 02, 2020 and November 01, 2020 FINDINGS: There is a persistent loculated pleural effusion on the left with small loculated pneumothorax at the left base, smaller than on most recent study. No tension component. There is also atelectatic change in the left base. Right lung is clear. Heart size and pulmonary vascular normal. No adenopathy. No bone lesions. Central catheter tip in superior vena cava. Status post gastric banding procedure. IMPRESSION: Persistent loculated pleural effusion left base with minimal residual loculated pneumothorax in the left base. Left base atelectasis noted. Right lung clear. No  new opacity evident. Stable cardiac silhouette. Central catheter tip in superior vena cava. Electronically Signed   By: Lowella Grip III M.D.   On: 11/13/2020 08:43    Assessment & Plan:   Jasson was seen today for follow-up and anemia.  Diagnoses and all orders for this visit:  Pneumonia of left lower lobe due to infectious organism- Based on his symptoms, exam, labs, and x-ray this has improved. -     DG Chest 2 View; Future -     CBC with Differential/Platelet; Future -     CBC with Differential/Platelet  Thoracic aortic aneurysm without rupture ([place])- Will recheck this in 1 year.  Essential hypertension- His blood pressure is adequately well controlled. -     CBC with Differential/Platelet; Future -     Basic metabolic panel; Future -     Basic metabolic panel -     CBC with Differential/Platelet  Acquired hypothyroidism- His TSH is mildly elevated but he appears euthyroid.  If he develops symptoms then we will start a thyroid supplement. -     TSH; Future -     TSH  Deficiency anemia- His H&H have improved.  Testing for vitamin deficiencies and hemolysis is unremarkable.  This is likely an anemia associated with her recent acute illness. -     CBC with Differential/Platelet; Future -     Vitamin B12; Future -     Vitamin B1; Future -     Folate; Future -     Ferritin; Future -     Reticulocytes; Future -     Lactate dehydrogenase; Future -     Haptoglobin; Future -     Haptoglobin -     Lactate dehydrogenase -     Reticulocytes -     Ferritin -     Folate -     Vitamin B1 -     Vitamin B12 -     CBC with Differential/Platelet  Zinc deficiency- His zinc level is normal now. -     Zinc; Future -     Zinc  Hyperglycemia- His A1c is at 6.1%.  Medical therapy is not indicated. -     Hemoglobin A1c; Future -     Basic metabolic panel; Future -     Basic metabolic panel -     Hemoglobin A1c  Elevated LFTs- His liver enzymes are normal now. -  Hepatic  function panel; Future -     Protime-INR; Future -     Protime-INR -     Hepatic function panel  Other orders -     Pneumococcal conjugate vaccine 13-valent   I am having Rickey D. Cleda Mccreedy "Doug" maintain his Turmeric, multivitamin, tadalafil, CVS Zinc Gluconate, triazolam, cholecalciferol, acetaminophen, feeding supplement, and ceFAZolin.  No orders of the defined types were placed in this encounter.    Follow-up: Return in about 4 weeks (around 12/11/2020).  Scarlette Calico, MD

## 2020-11-13 NOTE — Patient Instructions (Signed)
Anemia  Anemia is a condition in which you do not have enough red blood cells or hemoglobin. Hemoglobin is a substance in red blood cells that carries oxygen. When you do not have enough red blood cells or hemoglobin (are anemic), your body cannot get enough oxygen and your organs may not work properly. As a result, you may feel very tired or have other problems. What are the causes? Common causes of anemia include:  Excessive bleeding. Anemia can be caused by excessive bleeding inside or outside the body, including bleeding from the intestine or from periods in women.  Poor nutrition.  Long-lasting (chronic) kidney, thyroid, and liver disease.  Bone marrow disorders.  Cancer and treatments for cancer.  HIV (human immunodeficiency virus) and AIDS (acquired immunodeficiency syndrome).  Treatments for HIV and AIDS.  Spleen problems.  Blood disorders.  Infections, medicines, and autoimmune disorders that destroy red blood cells. What are the signs or symptoms? Symptoms of this condition include:  Minor weakness.  Dizziness.  Headache.  Feeling heartbeats that are irregular or faster than normal (palpitations).  Shortness of breath, especially with exercise.  Paleness.  Cold sensitivity.  Indigestion.  Nausea.  Difficulty sleeping.  Difficulty concentrating. Symptoms may occur suddenly or develop slowly. If your anemia is mild, you may not have symptoms. How is this diagnosed? This condition is diagnosed based on:  Blood tests.  Your medical history.  A physical exam.  Bone marrow biopsy. Your health care provider may also check your stool (feces) for blood and may do additional testing to look for the cause of your bleeding. You may also have other tests, including:  Imaging tests, such as a CT scan or MRI.  Endoscopy.  Colonoscopy. How is this treated? Treatment for this condition depends on the cause. If you continue to lose a lot of blood, you may  need to be treated at a hospital. Treatment may include:  Taking supplements of iron, vitamin M08, or folic acid.  Taking a hormone medicine (erythropoietin) that can help to stimulate red blood cell growth.  Having a blood transfusion. This may be needed if you lose a lot of blood.  Making changes to your diet.  Having surgery to remove your spleen. Follow these instructions at home:  Take over-the-counter and prescription medicines only as told by your health care provider.  Take supplements only as told by your health care provider.  Follow any diet instructions that you were given.  Keep all follow-up visits as told by your health care provider. This is important. Contact a health care provider if:  You develop new bleeding anywhere in the body. Get help right away if:  You are very weak.  You are short of breath.  You have pain in your abdomen or chest.  You are dizzy or feel faint.  You have trouble concentrating.  You have bloody or black, tarry stools.  You vomit repeatedly or you vomit up blood. Summary  Anemia is a condition in which you do not have enough red blood cells or enough of a substance in your red blood cells that carries oxygen (hemoglobin).  Symptoms may occur suddenly or develop slowly.  If your anemia is mild, you may not have symptoms.  This condition is diagnosed with blood tests as well as a medical history and physical exam. Other tests may be needed.  Treatment for this condition depends on the cause of the anemia. This information is not intended to replace advice given to you by  your health care provider. Make sure you discuss any questions you have with your health care provider. Document Revised: 10/09/2017 Document Reviewed: 11/28/2016 Elsevier Patient Education  [place].

## 2020-11-14 ENCOUNTER — Other Ambulatory Visit: Payer: Self-pay | Admitting: *Deleted

## 2020-11-14 ENCOUNTER — Ambulatory Visit
Admission: RE | Admit: 2020-11-14 | Discharge: 2020-11-14 | Disposition: A | Discharge status: Home / Self Care | Payer: PPO | Source: Ambulatory Visit | Attending: Cardiothoracic Surgery | Admitting: Cardiothoracic Surgery

## 2020-11-14 DIAGNOSIS — S2242XA Multiple fractures of ribs, left side, initial encounter for closed fracture: Secondary | ICD-10-CM | POA: Diagnosis not present

## 2020-11-14 DIAGNOSIS — J439 Emphysema, unspecified: Secondary | ICD-10-CM | POA: Diagnosis not present

## 2020-11-14 DIAGNOSIS — J869 Pyothorax without fistula: Secondary | ICD-10-CM | POA: Diagnosis not present

## 2020-11-14 DIAGNOSIS — I2510 Atherosclerotic heart disease of native coronary artery without angina pectoris: Secondary | ICD-10-CM | POA: Diagnosis not present

## 2020-11-14 LAB — RETICULOCYTES
ABS Retic: 107800 cells/uL — ABNORMAL HIGH (ref 25000–9000)
Retic Ct Pct: 2.8 %

## 2020-11-14 MED ORDER — CEFAZOLIN IV (FOR PTA / DISCHARGE USE ONLY): 2.0000 g | Freq: Three times a day (TID) | INTRAVENOUS | 0 refills | Status: AC

## 2020-11-14 MED ORDER — IOPAMIDOL (ISOVUE-300) INJECTION 61%
75.0000 mL | Freq: Once | INTRAVENOUS | Status: AC | PRN
  Administered 2020-11-14: 75 mL via INTRAVENOUS

## 2020-11-14 NOTE — Progress Notes (Signed)
[name] [name] contacted the office after an office visit with his PCP, Dr. Yetta Barre. Pt states he had a CXR yesterday and his PCP recommended pt continue IV antibiotic therapy for empyema. Per Dr. Donata Clay, [facility] RN to continue administering IV Ancef for another week, to end Jan 11th. Pt to have CT Chest w/ contrast instead of CXR prior to exam with Dr. Donata Clay on Jan 12.

## 2020-11-15 ENCOUNTER — Other Ambulatory Visit: Payer: Self-pay | Admitting: Internal Medicine

## 2020-11-15 DIAGNOSIS — M170 Bilateral primary osteoarthritis of knee: Secondary | ICD-10-CM

## 2020-11-15 DIAGNOSIS — M19041 Primary osteoarthritis, right hand: Secondary | ICD-10-CM

## 2020-11-16 LAB — CULTURE, FUNGUS WITHOUT SMEAR

## 2020-11-17 DIAGNOSIS — J189 Pneumonia, unspecified organism: Secondary | ICD-10-CM | POA: Diagnosis not present

## 2020-11-17 DIAGNOSIS — J869 Pyothorax without fistula: Secondary | ICD-10-CM | POA: Diagnosis not present

## 2020-11-19 LAB — LACTATE DEHYDROGENASE: LDH: 141 U/L (ref 120–250)

## 2020-11-19 LAB — ZINC: Zinc: 68 ug/dL (ref 60–130)

## 2020-11-19 LAB — VITAMIN B1: Vitamin B1 (Thiamine): 28 nmol/L (ref 8–30)

## 2020-11-19 LAB — HAPTOGLOBIN: Haptoglobin: 218 mg/dL — ABNORMAL HIGH (ref 43–212)

## 2020-11-20 ENCOUNTER — Other Ambulatory Visit: Payer: Self-pay | Admitting: *Deleted

## 2020-11-20 DIAGNOSIS — J189 Pneumonia, unspecified organism: Secondary | ICD-10-CM | POA: Diagnosis not present

## 2020-11-20 DIAGNOSIS — D72829 Elevated white blood cell count, unspecified: Secondary | ICD-10-CM | POA: Diagnosis not present

## 2020-11-20 DIAGNOSIS — J869 Pyothorax without fistula: Secondary | ICD-10-CM | POA: Diagnosis not present

## 2020-11-20 DIAGNOSIS — D649 Anemia, unspecified: Secondary | ICD-10-CM | POA: Diagnosis not present

## 2020-11-21 ENCOUNTER — Ambulatory Visit (INDEPENDENT_AMBULATORY_CARE_PROVIDER_SITE_OTHER): Payer: Self-pay | Admitting: Cardiothoracic Surgery

## 2020-11-21 ENCOUNTER — Encounter: Payer: Self-pay | Admitting: Internal Medicine

## 2020-11-21 ENCOUNTER — Other Ambulatory Visit: Payer: Self-pay

## 2020-11-21 ENCOUNTER — Other Ambulatory Visit: Payer: Self-pay | Admitting: *Deleted

## 2020-11-21 ENCOUNTER — Ambulatory Visit
Admission: RE | Admit: 2020-11-21 | Discharge: 2020-11-21 | Disposition: A | Discharge status: Home / Self Care | Payer: PPO | Source: Ambulatory Visit | Attending: Cardiothoracic Surgery | Admitting: Cardiothoracic Surgery

## 2020-11-21 ENCOUNTER — Encounter: Payer: Self-pay | Admitting: Cardiothoracic Surgery

## 2020-11-21 VITALS — BP 141/90 | HR 85 | Temp 99.6°F | Resp 20 | Ht 74.0 in | Wt 185.0 lb

## 2020-11-21 DIAGNOSIS — J869 Pyothorax without fistula: Secondary | ICD-10-CM | POA: Diagnosis not present

## 2020-11-21 DIAGNOSIS — R918 Other nonspecific abnormal finding of lung field: Secondary | ICD-10-CM | POA: Diagnosis not present

## 2020-11-21 DIAGNOSIS — Z452 Encounter for adjustment and management of vascular access device: Secondary | ICD-10-CM | POA: Diagnosis not present

## 2020-11-21 DIAGNOSIS — J439 Emphysema, unspecified: Secondary | ICD-10-CM | POA: Diagnosis not present

## 2020-11-21 MED ORDER — AZITHROMYCIN 250 MG PO TABS: ORAL_TABLET | ORAL | 1 refills | Status: DC

## 2020-11-21 NOTE — Progress Notes (Signed)
PCP is Janith Lima, MD Referring Provider is Sherwood Gambler, MD  Chief Complaint  Patient presents with  . Routine Post Op    S/p drainage of empyema 12/18, CT chest 1/5    HPI: Postop visit 3 weeks after left VATS and drainage of empyema which grew out streptococcal intermedius.  The patient has received 3 weeks of IV antibiotics.  His breathing and overall strength and exercise tolerance have improved.  He walked 5 miles on his treadmill a few days ago.  He has received IV Ancef directed by home health nursing and pharmacy, last dose yesterday.  The PICC line was removed in the office today.  The patient's chest x-ray from today is pending.  Over a week ago he had a CT scan of the chest which showed significant improvement in the empyema with some residual pleural thickening along the mediastinal border anteriorly.  He denies cough fever or shortness of breath.  He does have some post thoracotomy neuropathic symptoms beneath his left breast crease.   Past Medical History:  Diagnosis Date  . Allergy   . Diabetes mellitus    type 2-off all meds now after wt loss  . Empyema ([place]) 10/2020  . GERD (gastroesophageal reflux disease)   . Hyperlipidemia   . Hyperlipidemia   . Hypertension   . Osteoarthritis    hands, mild, not disabling    Past Surgical History:  Procedure Laterality Date  . Arthroscopy X 1 left ([name])     X 2 right  . COLONOSCOPY    . correction of deviated septum    . DECORTICATION Left 10/26/2020   Procedure: DECORTICATION OF LEFT LUNG, APPLICATION OF ON Q-PUMP FOR PAIN MANAGEMENT;  Surgeon: Ivin Poot, MD;  Location: [facility];  Service: Thoracic;  Laterality: Left;  . GANGLION CYST EXCISION     right wrist ([place])  . GASTRIC BANDING PORT REVISION  8/11  . PILONIDAL CYST / SINUS EXCISION  65 yrs old  . TONSILLECTOMY    . ULNAR COLLATERAL LIGAMENT REPAIR Right 09/21/2013   Procedure: ULNAR COLLATERAL LIGAMENT REPAIR METACARPAL PHALANGE RIGHT THUMB  POSSIBLE APL GRAFT RECONSTRUCTION;  Surgeon: Wynonia Sours, MD;  Location: [facility];  Service: Orthopedics;  Laterality: Right;  Marland Kitchen VIDEO ASSISTED THORACOSCOPY (VATS)/EMPYEMA Left 10/26/2020   Procedure: VIDEO ASSISTED THORACOSCOPY (VATS)/EMPYEMA ,INSERTION OF LEFT PLEURAL CHEST TUBES X 3;  Surgeon: Ivin Poot, MD;  Location: [facility] OR;  Service: Thoracic;  Laterality: Left;    Family History  Problem Relation Age of Onset  . Cancer Father        lung cancer metatstatic cancer to brain  . Coronary artery disease Father   . Heart attack Father        X 2 (41,45)  . Heart disease Father        CAD/MI x 2  . Cancer Mother        rapid on set  . Hypertension Mother   . Parkinsonism Maternal Grandfather   . Diabetes Neg Hx   . Colon cancer Neg Hx   . Colon polyps Neg Hx   . Esophageal cancer Neg Hx   . Rectal cancer Neg Hx   . Stomach cancer Neg Hx     Social History Social History   Tobacco Use  . Smoking status: Former Smoker    Quit date: 06/13/1995    Years since quitting: 25.4  . Smokeless tobacco: Never Used  Vaping Use  . Vaping Use:  Never used  Substance Use Topics  . Alcohol use: No    Alcohol/week: 0.0 standard drinks  . Drug use: No    Current Outpatient Medications  Medication Sig Dispense Refill  . acetaminophen (TYLENOL) 650 MG CR tablet Take 650 mg by mouth every 8 (eight) hours as needed for pain.    Marland Kitchen azithromycin (ZITHROMAX Z-PAK) 250 MG tablet 2 tablets day 1 then one tablet daily until finished 6 each 1  . cholecalciferol (VITAMIN D3) 25 MCG (1000 UNIT) tablet Take 1,000 Units by mouth daily.    . CVS ZINC GLUCONATE 50 MG tablet TAKE 1 TABLET (50 MG TOTAL) BY MOUTH EVERY OTHER DAY. (Patient taking differently: Take 50 mg by mouth every other day.) 45 tablet 1  . feeding supplement (ENSURE ENLIVE / ENSURE PLUS) LIQD Take 237 mLs by mouth 3 (three) times daily with meals. 237 mL 12  . Multiple Vitamin (MULTIVITAMIN) tablet Take 1 tablet by  mouth daily.    . tadalafil (CIALIS) 20 MG tablet TAKE 1 TABLET BY MOUTH EVERY DAY AS NEEDED FOR ERECTILE DYSFUNCTION 20 tablet 5  . triazolam (HALCION) 0.25 MG tablet TAKE 1 TABLET (0.25 MG TOTAL) BY MOUTH AT BEDTIME AS NEEDED. (Patient taking differently: Take 0.25 mg by mouth at bedtime.) 90 tablet 1  . Turmeric 450 MG CAPS Take 1 capsule by mouth 2 (two) times daily with a meal. 180 capsule 3   No current facility-administered medications for this visit.    Allergies  Allergen Reactions  . Penicillins Anaphylaxis    Tolerated cefazolin/cefepime 10/2020 Did it involve swelling of the face/tongue/throat, SOB, or low BP? Y Did it involve sudden or severe rash/hives, skin peeling, or any reaction on the inside of your mouth or nose? Y Did you need to seek medical attention at a hospital or doctor's office? Y When did it last happen?Childhood If all above answers are "NO", may proceed with cephalosporin use.    . Codeine Nausea And Vomiting    Review of Systems   Appetite improved weight back to normal Minimal chest pain taking Tylenol only No abdominal pain or change in bowel habits No peripheral edema No difficulty swallowing No palpitations  BP (!) 141/90 (BP Location: Left Arm, Patient Position: Sitting)   Pulse 85   Temp 99.6 F (37.6 C)   Resp 20   Ht 6\' 2"  (1.88 m)   Wt 185 lb (83.9 kg)   SpO2 95% Comment: RA with mask on  BMI 23.75 kg/m  Physical Exam      Exam    General- alert and comfortable.  Left VATS incision well-healed.  Chest tube sutures removed from left chest wall.    Neck- no JVD, no cervical adenopathy palpable, no carotid bruit   Lungs- clear without rales, wheezes   Cor- regular rate and rhythm, no murmur , gallop   Abdomen- soft, non-tender   Extremities - warm, non-tender, minimal edema.  PICC line removed from right upper arm and dressing placed.   Neuro- oriented, appropriate, no focal weakness   Diagnostic Tests: Chest x-ray  today is pending.  Impression: Patient proving following left VA TS and drainage of streptococcal empyema.  He had 4 L of infected fluid so it will take a while for the pleural thickening to resolve.  I have administered oral antibiotics, Z-Pak which he will take this week.  I have reassured him that his postoperative neuropathic pain under the left breast line will improve with time.  He will  return in 2 weeks for a follow-up chest x-ray and be seen by my partner Dr. Kipp Brood.  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  Plan: Chest x-ray today, I will call patient results Start oral azithromycin Gradually increase activities but avoid heavy lifting or running Return in 2 weeks with chest x-ray  Len Childs, MD [facility] [phone]

## 2020-11-29 LAB — FUNGUS CULTURE WITH STAIN

## 2020-11-29 LAB — FUNGUS CULTURE RESULT

## 2020-11-29 LAB — FUNGAL ORGANISM REFLEX

## 2020-12-06 ENCOUNTER — Other Ambulatory Visit: Payer: Self-pay | Admitting: Thoracic Surgery (Cardiothoracic Vascular Surgery)

## 2020-12-06 DIAGNOSIS — J869 Pyothorax without fistula: Secondary | ICD-10-CM

## 2020-12-07 ENCOUNTER — Other Ambulatory Visit: Payer: Self-pay

## 2020-12-07 ENCOUNTER — Other Ambulatory Visit: Payer: Self-pay | Admitting: Thoracic Surgery (Cardiothoracic Vascular Surgery)

## 2020-12-07 ENCOUNTER — Ambulatory Visit (INDEPENDENT_AMBULATORY_CARE_PROVIDER_SITE_OTHER): Payer: Self-pay | Admitting: Thoracic Surgery (Cardiothoracic Vascular Surgery)

## 2020-12-07 ENCOUNTER — Encounter: Payer: Self-pay | Admitting: Thoracic Surgery (Cardiothoracic Vascular Surgery)

## 2020-12-07 ENCOUNTER — Ambulatory Visit
Admission: RE | Admit: 2020-12-07 | Discharge: 2020-12-07 | Disposition: A | Discharge status: Home / Self Care | Payer: PPO | Source: Ambulatory Visit | Attending: Thoracic Surgery (Cardiothoracic Vascular Surgery) | Admitting: Thoracic Surgery (Cardiothoracic Vascular Surgery)

## 2020-12-07 VITALS — BP 118/81 | HR 87 | Resp 20 | Ht 74.0 in | Wt 187.0 lb

## 2020-12-07 DIAGNOSIS — J90 Pleural effusion, not elsewhere classified: Secondary | ICD-10-CM

## 2020-12-07 DIAGNOSIS — J869 Pyothorax without fistula: Secondary | ICD-10-CM

## 2020-12-07 DIAGNOSIS — Z8709 Personal history of other diseases of the respiratory system: Secondary | ICD-10-CM | POA: Diagnosis not present

## 2020-12-07 DIAGNOSIS — R918 Other nonspecific abnormal finding of lung field: Secondary | ICD-10-CM | POA: Diagnosis not present

## 2020-12-07 NOTE — Progress Notes (Signed)
      [address]       [place] [address]             [phone]        Ranferi D Boldin [place]wood Medical Record #[id number] Date of Birth: Jun 19, 1956  Referring: Sherwood Gambler, MD Primary Care: Janith Lima, MD Primary Cardiologist:No primary care provider on file.  Reason for visit:   follow-up  History of Present Illness:     Mr. Vanwagoner comes in for his 2 week appointment.  He underwent a VATS decort with CT placement X3 by Dr. Prescott Gum, on 12/17.  Path showed acute and chronic inflammation  He complains of L chest wall pain, and some exertional dyspnea.  He has completed his abx, and denies any fevers or chills  Physical Exam: BP 118/81 (BP Location: Right Arm, Patient Position: Sitting)   Pulse 87   Resp 20   Ht 6\' 2"  (1.88 m)   Wt 187 lb (84.8 kg)   SpO2 97% Comment: RA  BMI 24.01 kg/m   Alert NAD Incision clean.  Abdomen soft, ND trace peripheral edema   Diagnostic Studies & Laboratory data: CXR: residual L effusion     Assessment / Plan:   65 yo male s/p VATS decort, with residual fluid collection.  We discussed several options including, redo surgery to free-up the inferior portion of the lung vs thoracentesis.  Based on Dr. Lucianne Lei Trigt's note, redo surgery would be very challenging given is original presentation.  We will perform an US guided thoracentesis, and he will follow-up in 1 month.   Lajuana Matte 12/07/2020 12:58 PM

## 2020-12-08 LAB — ACID FAST CULTURE WITH REFLEXED SENSITIVITIES (MYCOBACTERIA)
Acid Fast Culture: NEGATIVE
Acid Fast Culture: NEGATIVE

## 2020-12-10 ENCOUNTER — Ambulatory Visit (HOSPITAL_COMMUNITY)
Admission: RE | Admit: 2020-12-10 | Discharge: 2020-12-10 | Disposition: A | Discharge status: Home / Self Care | Payer: PPO | Source: Ambulatory Visit | Attending: Thoracic Surgery (Cardiothoracic Vascular Surgery) | Admitting: Thoracic Surgery (Cardiothoracic Vascular Surgery)

## 2020-12-10 DIAGNOSIS — Z20822 Contact with and (suspected) exposure to covid-19: Secondary | ICD-10-CM | POA: Insufficient documentation

## 2020-12-10 DIAGNOSIS — Z01812 Encounter for preprocedural laboratory examination: Secondary | ICD-10-CM | POA: Insufficient documentation

## 2020-12-10 LAB — SARS CORONAVIRUS 2 (TAT 6-24 HRS): SARS Coronavirus 2: NEGATIVE

## 2020-12-13 ENCOUNTER — Other Ambulatory Visit: Payer: Self-pay

## 2020-12-13 ENCOUNTER — Ambulatory Visit (HOSPITAL_COMMUNITY)
Admission: RE | Admit: 2020-12-13 | Discharge: 2020-12-13 | Disposition: A | Discharge status: Home / Self Care | Payer: PPO | Source: Ambulatory Visit | Attending: Thoracic Surgery (Cardiothoracic Vascular Surgery) | Admitting: Thoracic Surgery (Cardiothoracic Vascular Surgery)

## 2020-12-13 ENCOUNTER — Other Ambulatory Visit: Payer: Self-pay | Admitting: Thoracic Surgery (Cardiothoracic Vascular Surgery)

## 2020-12-13 DIAGNOSIS — J90 Pleural effusion, not elsewhere classified: Secondary | ICD-10-CM | POA: Diagnosis not present

## 2020-12-13 DIAGNOSIS — J9811 Atelectasis: Secondary | ICD-10-CM | POA: Diagnosis not present

## 2020-12-13 MED ORDER — LIDOCAINE HCL 1 % IJ SOLN
INTRAMUSCULAR | Status: AC
  Filled 2020-12-13: qty 20

## 2020-12-13 NOTE — Progress Notes (Signed)
Interventional Radiology Brief Note:  Patient presented to [facility] for possible thoracentesis.  Unfortunately, Limited US Chest shows small amount of highly loculated, thick fluid which is not amenable to thoracentesis.  Pictures saved for review; see dictation for further detail.  No procedure performed.  Patient discharged home.   Brynda Greathouse, MS RD PA-C

## 2020-12-17 ENCOUNTER — Telehealth: Payer: Self-pay | Admitting: *Deleted

## 2020-12-17 NOTE — Telephone Encounter (Signed)
[name] [name] contacted the office stating radiology was unable to perform the US guided thoracentesis he had scheduled for 12/13/20. Pt eager to talk surgical dates with Dr. 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 Pt rescheduled to f/u with Dr. Kipp Brood 2/14 at 2pm. Pt aware, no further questions.

## 2020-12-18 ENCOUNTER — Other Ambulatory Visit: Payer: Self-pay | Admitting: Internal Medicine

## 2020-12-18 DIAGNOSIS — N5201 Erectile dysfunction due to arterial insufficiency: Secondary | ICD-10-CM

## 2020-12-21 ENCOUNTER — Other Ambulatory Visit: Payer: Self-pay | Admitting: Thoracic Surgery (Cardiothoracic Vascular Surgery)

## 2020-12-21 DIAGNOSIS — J869 Pyothorax without fistula: Secondary | ICD-10-CM

## 2020-12-24 ENCOUNTER — Encounter: Payer: Self-pay | Admitting: Thoracic Surgery (Cardiothoracic Vascular Surgery)

## 2020-12-24 ENCOUNTER — Ambulatory Visit (INDEPENDENT_AMBULATORY_CARE_PROVIDER_SITE_OTHER): Payer: Self-pay | Admitting: Thoracic Surgery (Cardiothoracic Vascular Surgery)

## 2020-12-24 ENCOUNTER — Other Ambulatory Visit: Payer: Self-pay

## 2020-12-24 ENCOUNTER — Ambulatory Visit
Admission: RE | Admit: 2020-12-24 | Discharge: 2020-12-24 | Disposition: A | Discharge status: Home / Self Care | Payer: PPO | Source: Ambulatory Visit | Attending: Thoracic Surgery (Cardiothoracic Vascular Surgery) | Admitting: Thoracic Surgery (Cardiothoracic Vascular Surgery)

## 2020-12-24 VITALS — BP 107/69 | HR 82 | Temp 98.2°F | Resp 18 | Ht 74.0 in | Wt 188.0 lb

## 2020-12-24 DIAGNOSIS — R079 Chest pain, unspecified: Secondary | ICD-10-CM | POA: Diagnosis not present

## 2020-12-24 DIAGNOSIS — J869 Pyothorax without fistula: Secondary | ICD-10-CM

## 2020-12-24 DIAGNOSIS — R0602 Shortness of breath: Secondary | ICD-10-CM | POA: Diagnosis not present

## 2020-12-24 MED ORDER — PREGABALIN 25 MG PO CAPS: 25.0000 mg | ORAL_CAPSULE | Freq: Two times a day (BID) | ORAL | 0 refills | Status: DC

## 2020-12-24 MED ORDER — LIDOCAINE 5 % EX PTCH: 1.0000 | MEDICATED_PATCH | CUTANEOUS | 0 refills | Status: DC

## 2020-12-24 NOTE — Progress Notes (Signed)
     [address]       [place] [address]             [phone]       [name] [name] comes in for follow-up appointment.  He is a 65 year old male that underwent a left video-assisted thoracoscopy and decortication for loculated empyema by Dr. Prescott Gum in December of last year.  Overall he is doing well, except for the numbness and paresthesias that he is experiencing along his left costal margin.  He denies any respiratory symptoms and states that he can walk up several flights of stairs without experiencing any dyspnea.  His chest x-ray revealed a left-sided effusion which is essentially unchanged from his x-ray in January.  He was scheduled for an ultrasound-guided thoracentesis but this was unsuccessful due to the loculated nature of the effusion.  We discussed potential treatment options but given the fact that he is not symptomatic from a respiratory standpoint, I do not think that reexploration and evacuation of this loculated effusion is required at this point.  Additionally given the complex nature of his original surgery, reentry would be very challenging.  His main complaint is in regards to his pain and paresthesias around the incision which is likely due to spreading of the ribs and stretching of the nerve during the original operation.  I have given him prescription for Lyrica as well as some lidocaine patches, and will reassess his symptoms in 2 weeks.  [name] [name] Bary Leriche

## 2021-01-04 ENCOUNTER — Ambulatory Visit: Payer: PPO | Admitting: Thoracic Surgery (Cardiothoracic Vascular Surgery)

## 2021-01-11 ENCOUNTER — Other Ambulatory Visit: Payer: Self-pay

## 2021-01-11 ENCOUNTER — Encounter: Payer: Self-pay | Admitting: Thoracic Surgery (Cardiothoracic Vascular Surgery)

## 2021-01-11 NOTE — Progress Notes (Signed)
     [address]       [place] [address]             [phone]      Pt did not answer call on 2 occasions on this day  [place]

## 2021-01-25 ENCOUNTER — Other Ambulatory Visit: Payer: Self-pay | Admitting: Thoracic Surgery (Cardiothoracic Vascular Surgery)

## 2021-02-01 DIAGNOSIS — R1110 Vomiting, unspecified: Secondary | ICD-10-CM | POA: Diagnosis not present

## 2021-02-01 DIAGNOSIS — Z4651 Encounter for fitting and adjustment of gastric lap band: Secondary | ICD-10-CM | POA: Diagnosis not present

## 2021-02-12 ENCOUNTER — Other Ambulatory Visit: Payer: Self-pay | Admitting: Internal Medicine

## 2021-02-12 ENCOUNTER — Telehealth: Payer: Self-pay | Admitting: Internal Medicine

## 2021-02-12 DIAGNOSIS — E60 Dietary zinc deficiency: Secondary | ICD-10-CM

## 2021-02-12 DIAGNOSIS — F5101 Primary insomnia: Secondary | ICD-10-CM

## 2021-02-12 DIAGNOSIS — N5201 Erectile dysfunction due to arterial insufficiency: Secondary | ICD-10-CM

## 2021-02-12 DIAGNOSIS — Z9884 Bariatric surgery status: Secondary | ICD-10-CM

## 2021-02-12 MED ORDER — TADALAFIL 20 MG PO TABS: 20.0000 mg | ORAL_TABLET | Freq: Every day | ORAL | 5 refills | Status: AC | PRN

## 2021-02-12 MED ORDER — TRIAZOLAM 0.25 MG PO TABS
0.2500 mg | ORAL_TABLET | Freq: Every evening | ORAL | 1 refills | Status: DC | PRN
Start: 2021-02-12 — End: 2021-07-27

## 2021-02-12 MED ORDER — ZINC GLUCONATE 50 MG PO TABS
ORAL_TABLET | ORAL | 1 refills | Status: DC
Start: 2021-02-12 — End: 2023-02-02

## 2021-02-12 NOTE — Telephone Encounter (Signed)
1.Medication Requested: tadalafil (CIALIS) 20 MG tablet  triazolam (HALCION) 0.25 MG tablet  CVS ZINC GLUCONATE 50 MG tablet    2. Pharmacy (Name, Street, [place]): [id number] [place], [place]  3. On Med List: yes   4. Last Visit with PCP: 11-13-20  5. Next visit date with PCP: n/a   Patient is also requesting a refill for his arthritis medication. He does not know the name of it. He is also requesting a 90 day supply for the above medications.    Agent: Please be advised that RX refills may take up to 3 business days. We ask that you follow-up with your pharmacy.

## 2021-02-14 ENCOUNTER — Other Ambulatory Visit: Payer: Self-pay | Admitting: Thoracic Surgery (Cardiothoracic Vascular Surgery)

## 2021-04-05 ENCOUNTER — Other Ambulatory Visit: Payer: Self-pay | Admitting: Internal Medicine

## 2021-06-03 ENCOUNTER — Telehealth: Payer: Self-pay | Admitting: Internal Medicine

## 2021-06-03 NOTE — Telephone Encounter (Signed)
Dr.Lee Joni Fears: [facility]  Provider has called the office stating there were some incidental findings that he wanted to discuss. Please advise.  The providers cell (preferred): [phone] Office number: [phone]

## 2021-06-10 ENCOUNTER — Telehealth: Payer: Self-pay

## 2021-06-10 NOTE — Telephone Encounter (Signed)
Called pt, LVM stating an OV was needed to discuss per PCP

## 2021-06-10 NOTE — Telephone Encounter (Signed)
pt has called stating Dr.Lee Joni Fears from [facility] pertaining to some incidental findings that he wanted to discuss as these findings.  **Pts also states the medication that was rx'd for his Arthritis is not helping or working and would like another medication rx'd. Pt is willing to come in for a change of medication.    The providers cell (preferred): [phone] Office number: [phone]

## 2021-06-11 NOTE — Telephone Encounter (Signed)
[facility] called back to speak to Dr. Ronnald Ramp. Dr.Stafford would like to talk to Dr. Ronnald Ramp about the incidental findings prior to the patient being seen since he has not discussed anything he has found yet.   Requesting a callback at [phone] or office [phone].

## 2021-06-12 NOTE — Telephone Encounter (Signed)
Notes from [facility] are scanned in Media

## 2021-06-12 NOTE — Telephone Encounter (Signed)
Pt is scheduled for 8/4 @ 4pm

## 2021-06-13 ENCOUNTER — Encounter: Payer: Self-pay | Admitting: Internal Medicine

## 2021-06-13 ENCOUNTER — Other Ambulatory Visit: Payer: Self-pay

## 2021-06-13 ENCOUNTER — Ambulatory Visit (INDEPENDENT_AMBULATORY_CARE_PROVIDER_SITE_OTHER): Payer: PPO | Admitting: Internal Medicine

## 2021-06-13 VITALS — BP 138/82 | HR 77 | Temp 97.8°F | Resp 16 | Ht 74.0 in

## 2021-06-13 DIAGNOSIS — Z0000 Encounter for general adult medical examination without abnormal findings: Secondary | ICD-10-CM | POA: Diagnosis not present

## 2021-06-13 DIAGNOSIS — E60 Dietary zinc deficiency: Secondary | ICD-10-CM

## 2021-06-13 DIAGNOSIS — M5136 Other intervertebral disc degeneration, lumbar region: Secondary | ICD-10-CM

## 2021-06-13 DIAGNOSIS — N400 Enlarged prostate without lower urinary tract symptoms: Secondary | ICD-10-CM

## 2021-06-13 DIAGNOSIS — I10 Essential (primary) hypertension: Secondary | ICD-10-CM

## 2021-06-13 DIAGNOSIS — E039 Hypothyroidism, unspecified: Secondary | ICD-10-CM | POA: Diagnosis not present

## 2021-06-13 DIAGNOSIS — D508 Other iron deficiency anemias: Secondary | ICD-10-CM

## 2021-06-13 DIAGNOSIS — E519 Thiamine deficiency, unspecified: Secondary | ICD-10-CM

## 2021-06-13 DIAGNOSIS — E785 Hyperlipidemia, unspecified: Secondary | ICD-10-CM

## 2021-06-13 NOTE — Progress Notes (Signed)
Subjective:  Patient ID: [name] [name], male    DOB: [birth year]  Age: 65 y.o. MRN: [id number]  CC: Annual Exam, Hypertension, Hyperlipidemia, and Diabetes  This visit occurred during the SARS-CoV-2 public health emergency.  Safety protocols were in place, including screening questions prior to the visit, additional usage of staff PPE, and extensive cleaning of exam room while observing appropriate contact time as indicated for disinfecting solutions.     HPI TYVELL PRICKETT presents for a CPX and f/up -  He runs 5-7 miles/day on a treadmill.  He does not experience CP, DOE, diaphoresis, dizziness, lightheadedness, fatigue, or palpitations.  He has recently been seeing a chiropractor for back pain.  He wants to be referred to a pain specialist.  Outpatient Medications Prior to Visit  Medication Sig Dispense Refill   acetaminophen (TYLENOL) 650 MG CR tablet Take 650 mg by mouth every 8 (eight) hours as needed for pain.     cholecalciferol (VITAMIN D3) 25 MCG (1000 UNIT) tablet Take 1,000 Units by mouth daily.     feeding supplement (ENSURE ENLIVE / ENSURE PLUS) LIQD Take 237 mLs by mouth 3 (three) times daily with meals. 237 mL 12   Multiple Vitamin (MULTIVITAMIN) tablet Take 1 tablet by mouth daily.     pantoprazole (PROTONIX) 40 MG tablet TAKE 1 TABLET BY MOUTH EVERY DAY 90 tablet 1   tadalafil (CIALIS) 20 MG tablet Take 1 tablet (20 mg total) by mouth daily as needed for erectile dysfunction. 20 tablet 5   triazolam (HALCION) 0.25 MG tablet Take 1 tablet (0.25 mg total) by mouth at bedtime as needed. 90 tablet 1   Turmeric 450 MG CAPS Take 1 capsule by mouth 2 (two) times daily with a meal. 180 capsule 3   zinc gluconate (CVS ZINC GLUCONATE) 50 MG tablet TAKE 1 TABLET (50 MG TOTAL) BY MOUTH EVERY OTHER DAY. 45 tablet 1   cefUROXime (CEFTIN) 500 MG tablet TAKE 1 TABLET (200 MG TOTAL) BY MOUTH 2 (TWO) TIMES DAILY FOR 7 DAYS. 14 tablet 0   lidocaine (LIDODERM) 5 % Place 1 patch onto the  skin daily. Remove & Discard patch within 12 hours or as directed by MD 15 patch 0   pregabalin (LYRICA) 25 MG capsule Take 1 capsule (25 mg total) by mouth 2 (two) times daily. 60 capsule 0   No facility-administered medications prior to visit.    ROS Review of Systems  Constitutional:  Negative for diaphoresis and fatigue.  HENT: Negative.    Eyes: Negative.   Respiratory:  Negative for cough, chest tightness, shortness of breath and wheezing.   Cardiovascular:  Negative for chest pain, palpitations and leg swelling.  Gastrointestinal:  Negative for abdominal pain, constipation, diarrhea, nausea and vomiting.  Endocrine: Negative.   Genitourinary: Negative.  Negative for difficulty urinating, frequency, scrotal swelling, testicular pain and urgency.  Musculoskeletal:  Positive for arthralgias and back pain. Negative for myalgias.  Skin:  Negative for color change and pallor.  Neurological:  Negative for dizziness and weakness.  Hematological:  Negative for adenopathy. Does not bruise/bleed easily.  Psychiatric/Behavioral: Negative.     Objective:  BP 138/82 (BP Location: Right Arm, Patient Position: Sitting, Cuff Size: Large)   Pulse 77   Temp 97.8 F (36.6 C) (Oral)   Resp 16   Ht '6\' 2"'$  (1.88 m)   SpO2 95%   BMI 24.14 kg/m   BP Readings from Last 3 Encounters:  06/13/21 138/82  12/24/20 107/69  12/07/20 118/81  Wt Readings from Last 3 Encounters:  12/24/20 188 lb (85.3 kg)  12/07/20 187 lb (84.8 kg)  11/21/20 185 lb (83.9 kg)    Physical Exam Vitals reviewed.  HENT:     Nose: Nose normal.     Mouth/Throat:     Mouth: Mucous membranes are moist.  Eyes:     Conjunctiva/sclera: Conjunctivae normal.  Cardiovascular:     Rate and Rhythm: Normal rate and regular rhythm.     Pulses: Normal pulses.     Heart sounds: No murmur heard.   No friction rub. No gallop.  Pulmonary:     Effort: Pulmonary effort is normal.     Breath sounds: No stridor. No wheezing,  rhonchi or rales.  Abdominal:     General: Abdomen is flat. Bowel sounds are normal. There is no distension.     Palpations: Abdomen is soft. There is no hepatomegaly, splenomegaly or mass.     Tenderness: There is no abdominal tenderness. There is no guarding.     Hernia: There is no hernia in the left inguinal area or right inguinal area.  Genitourinary:    Pubic Area: No rash.      Penis: Normal and circumcised.      Testes: Normal.     Epididymis:     Right: Normal. Not inflamed or enlarged. No mass.     Left: Normal. Not inflamed or enlarged. No mass.     Prostate: Enlarged (1+ smooth symm BPH). Not tender and no nodules present.     Rectum: Normal. Guaiac result negative. No mass, tenderness, anal fissure, external hemorrhoid or internal hemorrhoid. Normal anal tone.  Musculoskeletal:        General: Normal range of motion.     Cervical back: Neck supple.     Right lower leg: No edema.     Left lower leg: No edema.  Lymphadenopathy:     Cervical: No cervical adenopathy.     Lower Body: No right inguinal adenopathy. No left inguinal adenopathy.  Skin:    General: Skin is warm and dry.  Neurological:     General: No focal deficit present.     Mental Status: He is alert.  Psychiatric:        Mood and Affect: Mood normal.        Behavior: Behavior normal.    Lab Results  Component Value Date   WBC 8.3 06/13/2021   HGB 13.0 06/13/2021   HCT 39.0 06/13/2021   PLT 184.0 06/13/2021   GLUCOSE 88 06/13/2021   CHOL 180 06/13/2021   TRIG (H) 06/13/2021    411.0 Triglyceride is over 400; calculations on Lipids are invalid.   HDL 44.00 06/13/2021   LDLDIRECT 85.0 06/13/2021   LDLCALC 92 02/24/2020   ALT 5 11/13/2020   AST 23 11/13/2020   NA 141 06/13/2021   K 4.8 06/13/2021   CL 108 06/13/2021   CREATININE 1.25 06/13/2021   BUN 20 06/13/2021   CO2 26 06/13/2021   TSH 1.54 06/13/2021   PSA 0.35 06/13/2021   INR 1.0 11/13/2020   HGBA1C 6.1 11/13/2020   MICROALBUR 4.2  (H) 12/30/2007    DG Chest 2 View  Result Date: 12/24/2020 CLINICAL DATA:  Previous VATS procedure for empyema. Shortness of breath and chest pain EXAM: CHEST - 2 VIEW COMPARISON:  December 07, 2020 FINDINGS: Drainage catheter left base present. Apparent loculated pleural effusion left base stable in size and configuration. Lungs elsewhere are clear. Heart size and pulmonary  vascularity are normal. No adenopathy. There is degenerative change in the thoracic spine. No pneumothorax. IMPRESSION: Apparent drainage catheter left base. Fairly small loculated pleural effusion left base again noted. A small amount of air is seen in this area which could be of infectious etiology or could be secondary to the presence of the tube. Lungs elsewhere clear. Cardiac silhouette within normal limits. Electronically Signed   By: Lowella Grip III M.D.   On: 12/24/2020 13:40    Assessment & Plan:   Demontra was seen today for annual exam, hypertension, hyperlipidemia and diabetes.  Diagnoses and all orders for this visit:  Essential hypertension- His blood pressure is adequately well controlled. -     Basic metabolic panel; Future -     TSH; Future -     TSH -     Basic metabolic panel  Acquired hypothyroidism- His TSH is normal and clinically he is euthyroid.  Thyroid replacement therapy is not indicated. -     TSH; Future -     TSH  Iron deficiency anemia due to dietary causes- His H&H and iron levels are normal now. -     CBC with Differential/Platelet; Future -     Iron; Future -     Ferritin; Future -     Ferritin -     Iron -     CBC with Differential/Platelet  Hyperlipidemia with target LDL less than 100- Statin therapy is not indicated -     Lipid panel; Future -     Lipid panel  Thiamine deficiency- I will monitor his thiamine level -     CBC with Differential/Platelet; Future -     Lipid panel; Future -     Vitamin B1; Future -     Vitamin B1 -     Lipid panel -     CBC with  Differential/Platelet  Routine general medical examination at a health care facility- Exam completed, labs reviewed, vaccines reviewed and updated, cancer screenings are up-to-date, patient education was given.  Zinc deficiency- I will monitor her zinc level -     CBC with Differential/Platelet; Future -     Zinc; Future -     Zinc -     CBC with Differential/Platelet  Benign prostatic hyperplasia without lower urinary tract symptoms- His PSA is normal.  This is a reassuring sign that he does not have prostate cancer. -     PSA; Future -     PSA  DDD (degenerative disc disease), lumbar -     Ambulatory referral to Neurosurgery  Other orders -     LDL cholesterol, direct  I have discontinued Nathaneil Canary D. Wendorf "Doug"'s pregabalin, lidocaine, and cefUROXime. I am also having him maintain his Turmeric, multivitamin, cholecalciferol, acetaminophen, feeding supplement, zinc gluconate, tadalafil, triazolam, and pantoprazole.  No orders of the defined types were placed in this encounter.    Follow-up: Return in about 6 months (around 12/14/2021).  Scarlette Calico, MD

## 2021-06-13 NOTE — Patient Instructions (Signed)
Health Maintenance, Male Adopting a healthy lifestyle and getting preventive care are important in promoting health and wellness. Ask your health care provider about: The right schedule for you to have regular tests and exams. Things you can do on your own to prevent diseases and keep yourself healthy. What should I know about diet, weight, and exercise? Eat a healthy diet  Eat a diet that includes plenty of vegetables, fruits, low-fat dairy products, and lean protein. Do not eat a lot of foods that are high in solid fats, added sugars, or sodium.  Maintain a healthy weight Body mass index (BMI) is a measurement that can be used to identify possible weight problems. It estimates body fat based on height and weight. Your health care provider can help determine your BMI and help you achieve or maintain ahealthy weight. Get regular exercise Get regular exercise. This is one of the most important things you can do for your health. Most adults should: Exercise for at least 150 minutes each week. The exercise should increase your heart rate and make you sweat (moderate-intensity exercise). Do strengthening exercises at least twice a week. This is in addition to the moderate-intensity exercise. Spend less time sitting. Even light physical activity can be beneficial. Watch cholesterol and blood lipids Have your blood tested for lipids and cholesterol at 66 years of age, then havethis test every 5 years. You may need to have your cholesterol levels checked more often if: Your lipid or cholesterol levels are high. You are older than 66 years of age. You are at high risk for heart disease. What should I know about cancer screening? Many types of cancers can be detected early and may often be prevented. Depending on your health history and family history, you may need to have cancer screening at various ages. This may include screening for: Colorectal cancer. Prostate cancer. Skin cancer. Lung  cancer. What should I know about heart disease, diabetes, and high blood pressure? Blood pressure and heart disease High blood pressure causes heart disease and increases the risk of stroke. This is more likely to develop in people who have high blood pressure readings, are of African descent, or are overweight. Talk with your health care provider about your target blood pressure readings. Have your blood pressure checked: Every 3-5 years if you are 18-39 years of age. Every year if you are 40 years old or older. If you are between the ages of 65 and 75 and are a current or former smoker, ask your health care provider if you should have a one-time screening for abdominal aortic aneurysm (AAA). Diabetes Have regular diabetes screenings. This checks your fasting blood sugar level. Have the screening done: Once every three years after age 45 if you are at a normal weight and have a low risk for diabetes. More often and at a younger age if you are overweight or have a high risk for diabetes. What should I know about preventing infection? Hepatitis B If you have a higher risk for hepatitis B, you should be screened for this virus. Talk with your health care provider to find out if you are at risk forhepatitis B infection. Hepatitis C Blood testing is recommended for: Everyone born from 1945 through 1965. Anyone with known risk factors for hepatitis C. Sexually transmitted infections (STIs) You should be screened each year for STIs, including gonorrhea and chlamydia, if: You are sexually active and are younger than 66 years of age. You are older than 66 years of age   and your health care provider tells you that you are at risk for this type of infection. Your sexual activity has changed since you were last screened, and you are at increased risk for chlamydia or gonorrhea. Ask your health care provider if you are at risk. Ask your health care provider about whether you are at high risk for HIV.  Your health care provider may recommend a prescription medicine to help prevent HIV infection. If you choose to take medicine to prevent HIV, you should first get tested for HIV. You should then be tested every 3 months for as long as you are taking the medicine. Follow these instructions at home: Lifestyle Do not use any products that contain nicotine or tobacco, such as cigarettes, e-cigarettes, and chewing tobacco. If you need help quitting, ask your health care provider. Do not use street drugs. Do not share needles. Ask your health care provider for help if you need support or information about quitting drugs. Alcohol use Do not drink alcohol if your health care provider tells you not to drink. If you drink alcohol: Limit how much you have to 0-2 drinks a day. Be aware of how much alcohol is in your drink. In the [place] one drink equals one 12 oz bottle of beer (355 mL), one 5 oz glass of wine (148 mL), or one 1 oz glass of hard liquor (44 mL). General instructions Schedule regular health, dental, and eye exams. Stay current with your vaccines. Tell your health care provider if: You often feel depressed. You have ever been abused or do not feel safe at home. Summary Adopting a healthy lifestyle and getting preventive care are important in promoting health and wellness. Follow your health care provider's instructions about healthy diet, exercising, and getting tested or screened for diseases. Follow your health care provider's instructions on monitoring your cholesterol and blood pressure. This information is not intended to replace advice given to you by your health care provider. Make sure you discuss any questions you have with your healthcare provider. Document Revised: 10/20/2018 Document Reviewed: 10/20/2018 Elsevier Patient Education  2022 Elsevier Inc.  

## 2021-06-14 ENCOUNTER — Encounter: Payer: Self-pay | Admitting: Internal Medicine

## 2021-06-14 LAB — CBC WITH DIFFERENTIAL/PLATELET
Basophils Absolute: 0.1 10*3/uL (ref 0.0–0.1)
Basophils Relative: 0.6 % (ref 0.0–3.0)
Eosinophils Absolute: 0.2 10*3/uL (ref 0.0–0.7)
Eosinophils Relative: 2.5 % (ref 0.0–5.0)
HCT: 39 % (ref 39.0–52.0)
Hemoglobin: 13 g/dL (ref 13.0–17.0)
Lymphocytes Relative: 35.8 % (ref 12.0–46.0)
Lymphs Abs: 3 10*3/uL (ref 0.7–4.0)
MCHC: 33.3 g/dL (ref 30.0–36.0)
MCV: 90.6 fl (ref 78.0–100.0)
Monocytes Absolute: 0.7 10*3/uL (ref 0.1–1.0)
Monocytes Relative: 8.4 % (ref 3.0–12.0)
Neutro Abs: 4.4 10*3/uL (ref 1.4–7.7)
Neutrophils Relative %: 52.7 % (ref 43.0–77.0)
Platelets: 184 10*3/uL (ref 150.0–400.0)
RBC: 4.3 Mil/uL (ref 4.22–5.81)
RDW: 13.9 % (ref 11.5–15.5)
WBC: 8.3 10*3/uL (ref 4.0–10.5)

## 2021-06-14 LAB — FERRITIN: Ferritin: 70.7 ng/mL (ref 22.0–322.0)

## 2021-06-14 LAB — LIPID PANEL
Cholesterol: 180 mg/dL (ref 0–200)
HDL: 44 mg/dL (ref >39.00)
Total CHOL/HDL Ratio: 4
Triglycerides: 411 mg/dL — ABNORMAL HIGH (ref 0.0–149.0)

## 2021-06-14 LAB — BASIC METABOLIC PANEL
BUN: 20 mg/dL (ref 6–23)
CO2: 26 mEq/L (ref 19–32)
Calcium: 9 mg/dL (ref 8.4–10.5)
Chloride: 108 mEq/L (ref 96–112)
Creatinine, Ser: 1.25 mg/dL (ref 0.40–1.50)
GFR: 60.23 mL/min (ref >60.00)
Glucose, Bld: 88 mg/dL (ref 70–99)
Potassium: 4.8 mEq/L (ref 3.5–5.1)
Sodium: 141 mEq/L (ref 135–145)

## 2021-06-14 LAB — LDL CHOLESTEROL, DIRECT: Direct LDL: 85 mg/dL

## 2021-06-14 LAB — IRON: Iron: 104 ug/dL (ref 42–165)

## 2021-06-14 LAB — PSA: PSA: 0.35 ng/mL (ref 0.10–4.00)

## 2021-06-14 LAB — TSH: TSH: 1.54 u[IU]/mL (ref 0.35–5.50)

## 2021-06-15 MED ORDER — MELOXICAM 7.5 MG PO TABS: 7.5000 mg | ORAL_TABLET | Freq: Every day | ORAL | 0 refills | Status: DC

## 2021-06-15 NOTE — Addendum Note (Signed)
Addended by: Janith Lima on: 06/15/2021 01:43 PM   Modules accepted: Orders

## 2021-06-19 ENCOUNTER — Telehealth: Payer: Self-pay | Admitting: Internal Medicine

## 2021-06-19 ENCOUNTER — Encounter: Payer: Self-pay | Admitting: Internal Medicine

## 2021-06-19 LAB — VITAMIN B1: Vitamin B1 (Thiamine): 29 nmol/L (ref 8–30)

## 2021-06-19 LAB — ZINC: Zinc: 81 ug/dL (ref 60–130)

## 2021-06-19 NOTE — Telephone Encounter (Signed)
Patient is calling about referral to Neuro  Patient says he is in pain & can not wait till he gets an appt scheduled w/ Neuro  Would like Dr. Ronnald Ramp to prescribe him some pain meds until then  Req callback [phone]

## 2021-06-20 ENCOUNTER — Other Ambulatory Visit: Payer: Self-pay | Admitting: Internal Medicine

## 2021-06-20 DIAGNOSIS — M5136 Other intervertebral disc degeneration, lumbar region: Secondary | ICD-10-CM

## 2021-06-20 DIAGNOSIS — M19041 Primary osteoarthritis, right hand: Secondary | ICD-10-CM

## 2021-06-20 DIAGNOSIS — M170 Bilateral primary osteoarthritis of knee: Secondary | ICD-10-CM

## 2021-06-20 DIAGNOSIS — M19042 Primary osteoarthritis, left hand: Secondary | ICD-10-CM

## 2021-06-20 MED ORDER — TRAMADOL HCL 50 MG PO TABS: 50.0000 mg | ORAL_TABLET | Freq: Three times a day (TID) | ORAL | 2 refills | Status: DC | PRN

## 2021-06-20 NOTE — Telephone Encounter (Signed)
Patient calling back in upset that Neuro referral has not been completed yet  Says he contacted Dr. Brien Few office & they advised they have not gotten referral info from [place]  Referral team advised they will submit in work queue to be completed  Patient is req a callback from Dr. Ronnald Ramp regarding pain meds.. says he is in so much pain he is not able to sleep at night

## 2021-06-24 ENCOUNTER — Telehealth: Payer: Self-pay | Admitting: Internal Medicine

## 2021-06-24 ENCOUNTER — Other Ambulatory Visit: Payer: Self-pay | Admitting: Internal Medicine

## 2021-06-24 DIAGNOSIS — M170 Bilateral primary osteoarthritis of knee: Secondary | ICD-10-CM

## 2021-06-24 DIAGNOSIS — M5136 Other intervertebral disc degeneration, lumbar region: Secondary | ICD-10-CM

## 2021-06-24 DIAGNOSIS — M19041 Primary osteoarthritis, right hand: Secondary | ICD-10-CM

## 2021-06-24 MED ORDER — TRAMADOL HCL 50 MG PO TABS: 50.0000 mg | ORAL_TABLET | Freq: Three times a day (TID) | ORAL | 2 refills | Status: DC | PRN

## 2021-06-24 NOTE — Telephone Encounter (Signed)
Patient calling in to advise that he contacted [facility] today (08.15.22) & they advised him they have not gotten referral from our office for him  Original referral was sent to Neurosurgery (Dr. Leeroy Cha) which patient said he did not want to use this provider  He is requesting referral be sent to [facility] (Dr. Marlaine Hind)   He said he has been waiting for over 2 weeks for this referral to be sent & nothing has been done  Please fax to [phone] ATTN: Dr. Marlaine Hind

## 2021-06-24 NOTE — Telephone Encounter (Signed)
1.Medication Requested: traMADol (ULTRAM) tablet 50-100 mg  2. Pharmacy (Name, Street, [place]): [id number] [place] [place] Phone:  [phone]:  [phone]  3. On Med List: yes  4. Last Visit with PCP: 08.04.22  5. Next visit date with PCP:   Agent: Please be advised that RX refills may take up to 3 business days. We ask that you follow-up with your pharmacy.

## 2021-06-27 ENCOUNTER — Telehealth: Payer: Self-pay

## 2021-06-27 ENCOUNTER — Other Ambulatory Visit: Payer: Self-pay | Admitting: Internal Medicine

## 2021-06-27 NOTE — Telephone Encounter (Signed)
Tramadol '50mg'$  is going to run out before he can be seen by Doctor Vertell Limber on Sept 19.   Requesting more pain meds so he does not run out.   1.Medication Requested:Tramadol '50MG'$   2. Pharm[facility] (Name[facility] [place] [address]  3. On Med List: yes  4. Last Visit with PCP: 06/13/2021  5. Next visit date with PCP: Not scheduled  Agent: Please be advised that RX refills may take up to 3 business days. We ask that you follow-up with your pharmacy.

## 2021-07-27 ENCOUNTER — Other Ambulatory Visit: Payer: Self-pay | Admitting: Internal Medicine

## 2021-07-27 DIAGNOSIS — F5101 Primary insomnia: Secondary | ICD-10-CM

## 2021-08-12 ENCOUNTER — Other Ambulatory Visit: Payer: Self-pay | Admitting: Internal Medicine

## 2021-08-12 DIAGNOSIS — M5136 Other intervertebral disc degeneration, lumbar region: Secondary | ICD-10-CM

## 2021-10-14 ENCOUNTER — Other Ambulatory Visit: Payer: Self-pay | Admitting: Internal Medicine

## 2021-10-14 DIAGNOSIS — I7121 Aneurysm of the ascending aorta, without rupture: Secondary | ICD-10-CM

## 2021-10-16 ENCOUNTER — Other Ambulatory Visit: Payer: Self-pay | Admitting: Internal Medicine

## 2021-10-16 DIAGNOSIS — I10 Essential (primary) hypertension: Secondary | ICD-10-CM

## 2021-10-17 ENCOUNTER — Telehealth: Payer: Self-pay

## 2021-10-17 NOTE — Telephone Encounter (Signed)
Pt has been informed that lab has been ordered and needed prior to CT. He expressed understanding.

## 2021-10-17 NOTE — Telephone Encounter (Signed)
-----   Message from [place] sent at 10/16/2021  1:59 PM EST ----- Regarding: needs BMET   Patient needs BMET prior to CT scan on 12/22/2  Thank you [name].

## 2021-10-18 ENCOUNTER — Other Ambulatory Visit (INDEPENDENT_AMBULATORY_CARE_PROVIDER_SITE_OTHER): Payer: PPO

## 2021-10-18 DIAGNOSIS — I10 Essential (primary) hypertension: Secondary | ICD-10-CM | POA: Diagnosis not present

## 2021-10-18 LAB — BASIC METABOLIC PANEL
BUN: 24 mg/dL — ABNORMAL HIGH (ref 6–23)
CO2: 25 mEq/L (ref 19–32)
Calcium: 9.6 mg/dL (ref 8.4–10.5)
Chloride: 105 mEq/L (ref 96–112)
Creatinine, Ser: 1.04 mg/dL (ref 0.40–1.50)
GFR: 74.92 mL/min (ref >60.00)
Glucose, Bld: 76 mg/dL (ref 70–99)
Potassium: 5 mEq/L (ref 3.5–5.1)
Sodium: 138 mEq/L (ref 135–145)

## 2021-10-31 ENCOUNTER — Other Ambulatory Visit: Payer: Self-pay

## 2021-10-31 ENCOUNTER — Ambulatory Visit (INDEPENDENT_AMBULATORY_CARE_PROVIDER_SITE_OTHER)
Admission: RE | Admit: 2021-10-31 | Discharge: 2021-10-31 | Disposition: A | Discharge status: Home / Self Care | Payer: PPO | Source: Ambulatory Visit | Attending: Internal Medicine | Admitting: Internal Medicine

## 2021-10-31 DIAGNOSIS — I7121 Aneurysm of the ascending aorta, without rupture: Secondary | ICD-10-CM

## 2021-10-31 DIAGNOSIS — K449 Diaphragmatic hernia without obstruction or gangrene: Secondary | ICD-10-CM | POA: Diagnosis not present

## 2021-10-31 DIAGNOSIS — J9811 Atelectasis: Secondary | ICD-10-CM | POA: Diagnosis not present

## 2021-10-31 DIAGNOSIS — I517 Cardiomegaly: Secondary | ICD-10-CM | POA: Diagnosis not present

## 2021-10-31 MED ORDER — IOHEXOL 350 MG/ML SOLN
80.0000 mL | Freq: Once | INTRAVENOUS | Status: AC | PRN
  Administered 2021-10-31: 11:00:00 80 mL via INTRAVENOUS

## 2021-11-12 ENCOUNTER — Other Ambulatory Visit: Payer: Self-pay | Admitting: Internal Medicine

## 2021-11-12 DIAGNOSIS — M5136 Other intervertebral disc degeneration, lumbar region: Secondary | ICD-10-CM

## 2021-11-12 DIAGNOSIS — M19041 Primary osteoarthritis, right hand: Secondary | ICD-10-CM

## 2021-11-12 DIAGNOSIS — M170 Bilateral primary osteoarthritis of knee: Secondary | ICD-10-CM

## 2021-11-19 ENCOUNTER — Other Ambulatory Visit: Payer: Self-pay | Admitting: Internal Medicine

## 2021-11-19 DIAGNOSIS — M5136 Other intervertebral disc degeneration, lumbar region: Secondary | ICD-10-CM

## 2021-11-19 DIAGNOSIS — M51369 Other intervertebral disc degeneration, lumbar region without mention of lumbar back pain or lower extremity pain: Secondary | ICD-10-CM

## 2021-12-26 IMAGING — CT CT CHEST W/ CM
2 of 4 series · 15 of 36 positions shown, 18 images · IV contrast (omnipaque)
Comparison: Chest x-ray from same day. CT chest dated September 25, 2011.

CLINICAL DATA: Continued cough and low-grade fever since finishing
antibiotics for pneumonia.

EXAM:
CT CHEST WITH CONTRAST
TECHNIQUE: Multidetector CT imaging of the chest was performed during
intravenous contrast administration.
CONTRAST:  75mL OMNIPAQUE IOHEXOL 300 MG/ML  SOLN

[Series 4: thins · axial · 0.84mm/px · z∈[+1145,+1468]mm · 12 of 451 slices shown, 15 images]
[im 24/451  mediastinal]
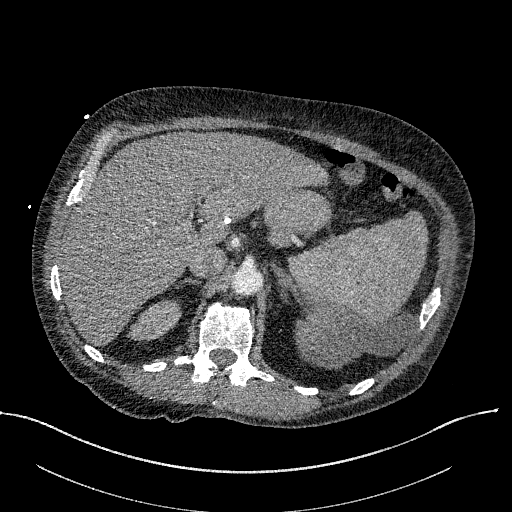
[im 24/451  lung]
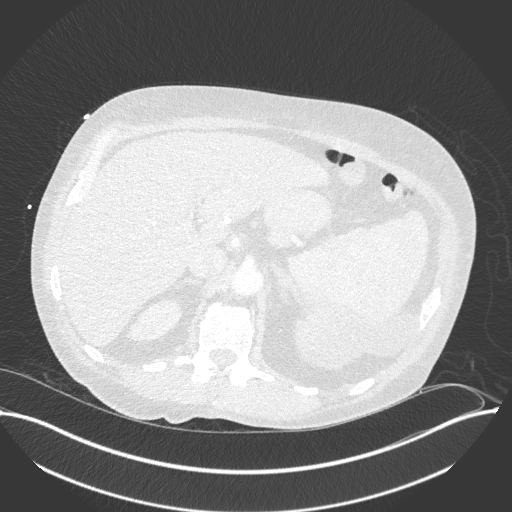
[im 72/451  lung]
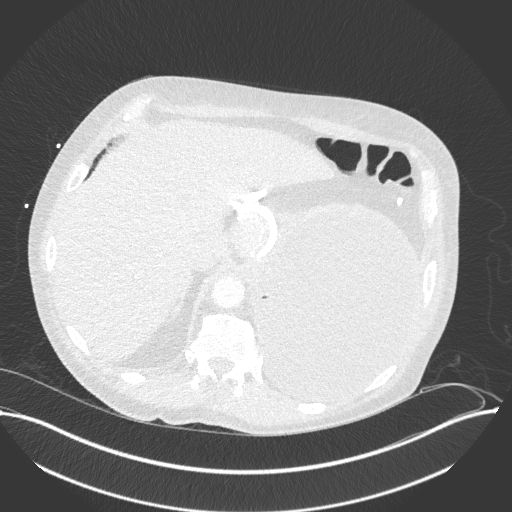
[im 95/451  lung]
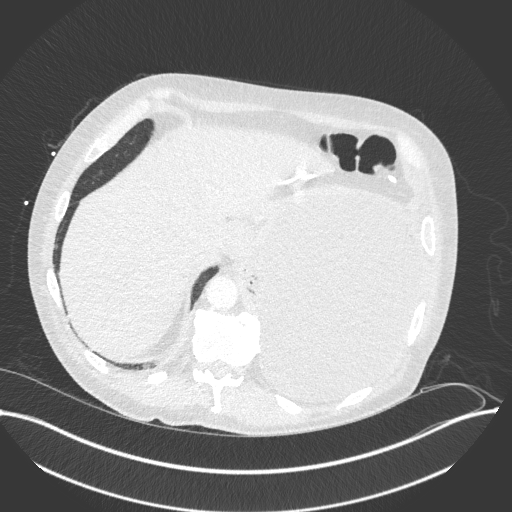
[im 143/451  lung]
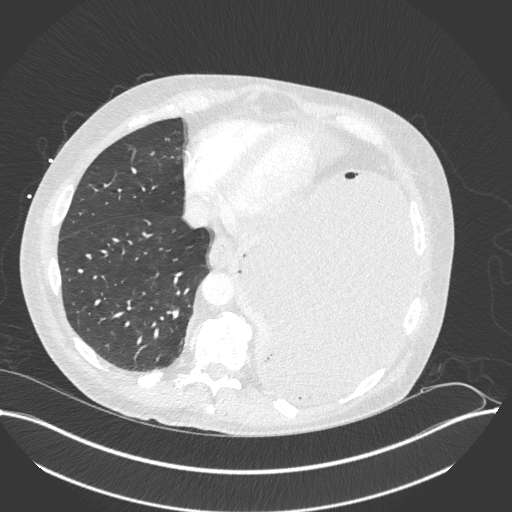
[im 166/451  mediastinal]
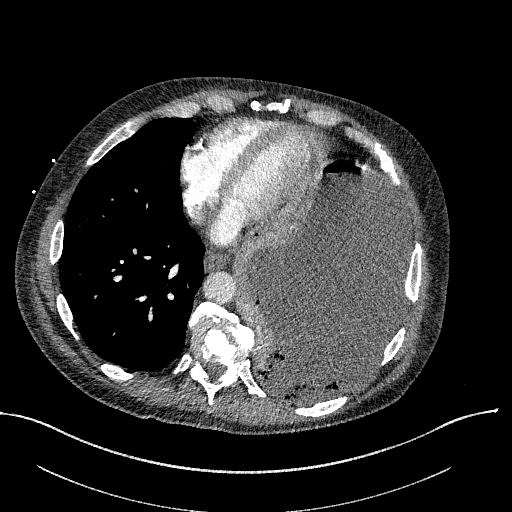
[im 166/451  lung]
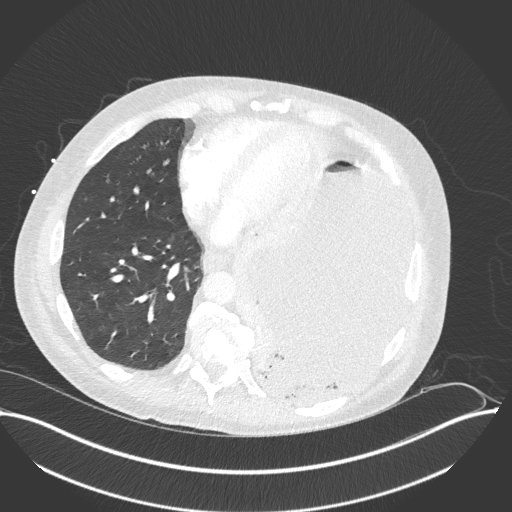
[im 214/451  lung]
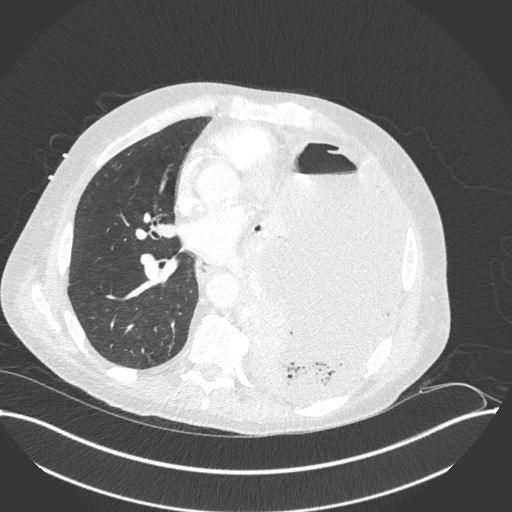
[im 237/451  lung]
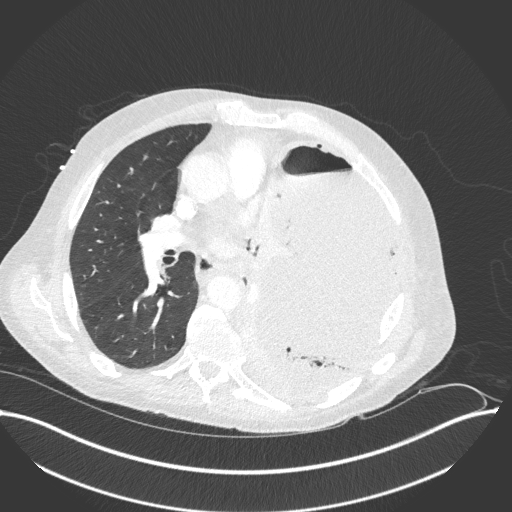
[im 285/451  lung]
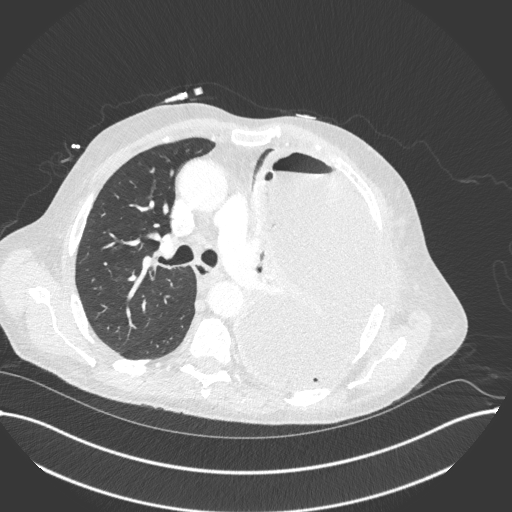
[im 308/451  mediastinal]
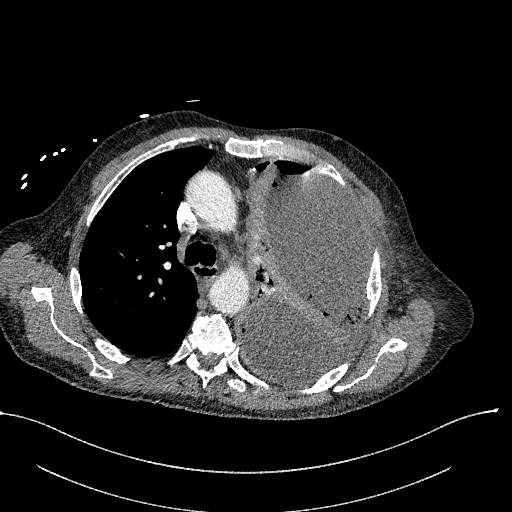
[im 308/451  lung]
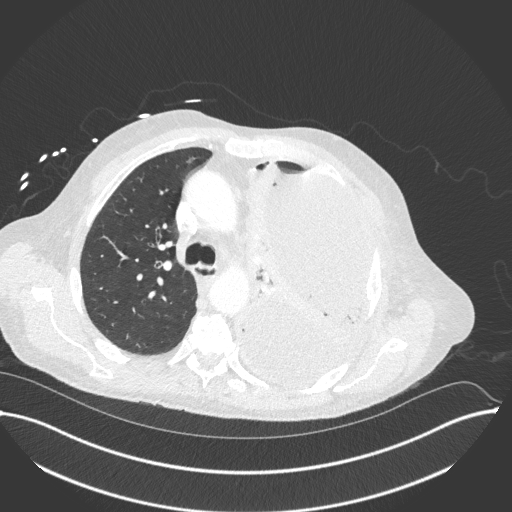
[im 356/451  lung]
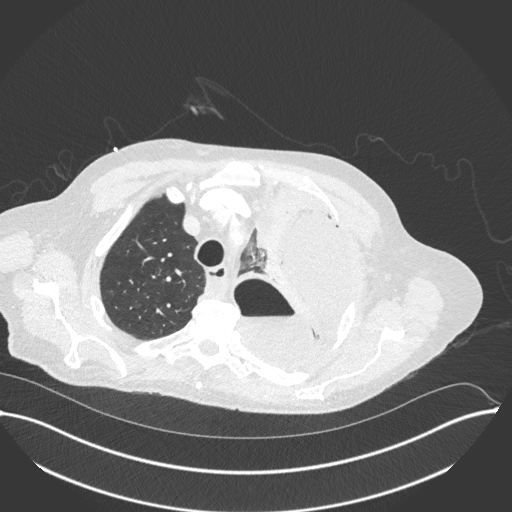
[im 379/451  lung]
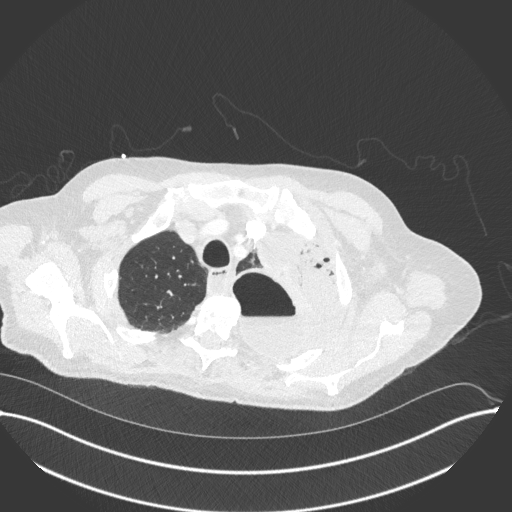
[im 427/451  lung]
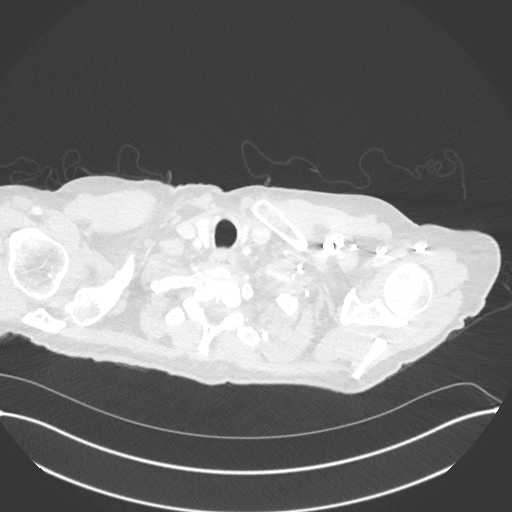

[Series 6: coronal · coronal · 0.72mm/px · 3 of 151 slices shown]
[im 31/151  lung]
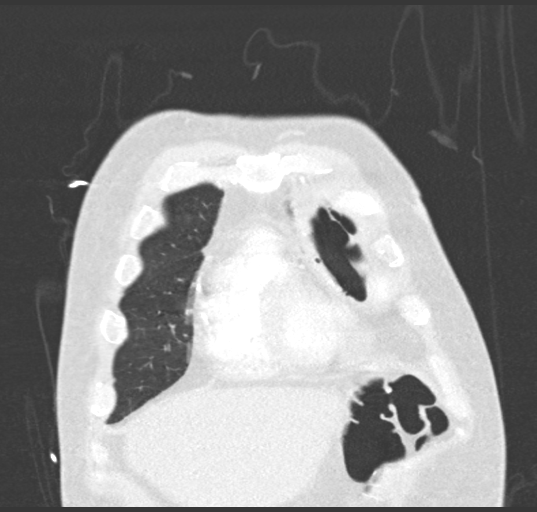
[im 61/151  lung]
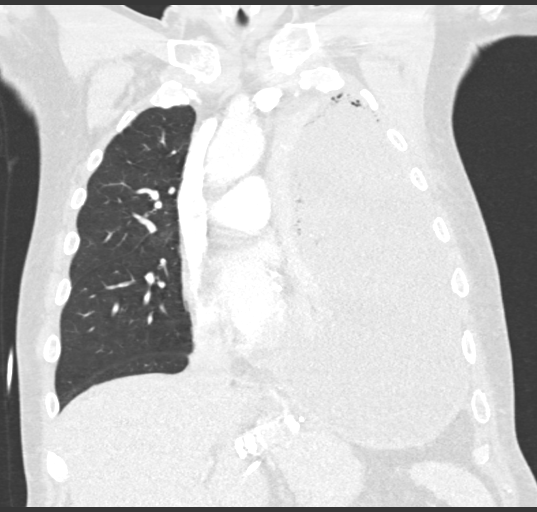
[im 91/151  lung]
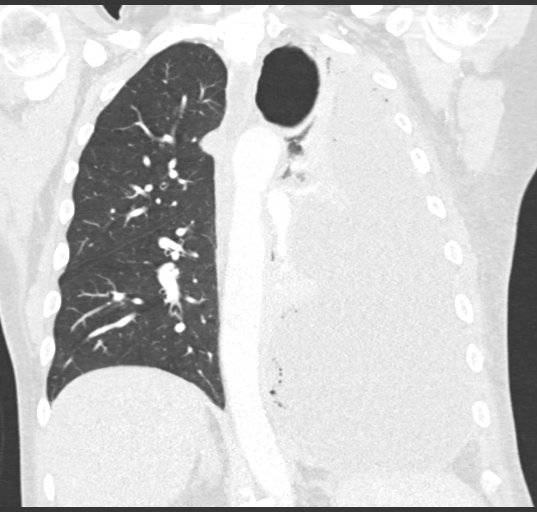

[15 of 36 positions shown; findings below may reference images not displayed]

FINDINGS: Cardiovascular: Normal heart size. No pericardial effusion.
Aneurysmal dilatation of the ascending thoracic aorta measuring up
to 4.2 cm. No dissection. Coronary, aortic arch, and branch vessel
atherosclerotic vascular disease. No pulmonary embolism.

Mediastinum/Nodes: Slight rightward mediastinal shift. No enlarged
mediastinal, hilar, or axillary lymph nodes. Patulous esophagus.
Thyroid gland and trachea demonstrate no significant findings.

Lungs/Pleura: Large loculated gas and fluid collection in the left
pleural space with pleural thickening and enhancement. The
collection essentially fills the entire left hemithorax with severe
mass effect on the left lung which is mostly collapsed. The right
lung is clear.

Upper Abdomen: No acute abnormality. Prior gastric lap band surgery.

Musculoskeletal: No chest wall abnormality. No acute or significant
osseous findings. Old left-sided rib fractures.
IMPRESSION: 1. Large left-sided empyema essentially filling the entire left
hemithorax with near complete collapse of the left lung.
2. 4.2 cm ascending aortic aneurysm. Recommend annual imaging
followup by CTA or MRA. This recommendation follows 7777
ACCF/AHA/AATS/ACR/ASA/SCA/KENNEDY/GILMAN/LUKAX/ANDI Guidelines for the
Diagnosis and Management of Patients with Thoracic Aortic Disease.
Circulation. 7777; 121: E266-e369. Aortic aneurysm NOS (4QOXR-FSI.1)
3. Aortic Atherosclerosis (4QOXR-UHD.D).

## 2021-12-27 IMAGING — DX DG CHEST 1V PORT
1 series · 1 of 1 positions shown · non-contrast
Comparison: 10/25/2020, CT chest 10/26/2019

CLINICAL DATA: 63-year-old male with a history vats

EXAM:
PORTABLE CHEST 1 VIEW

[chest ap]
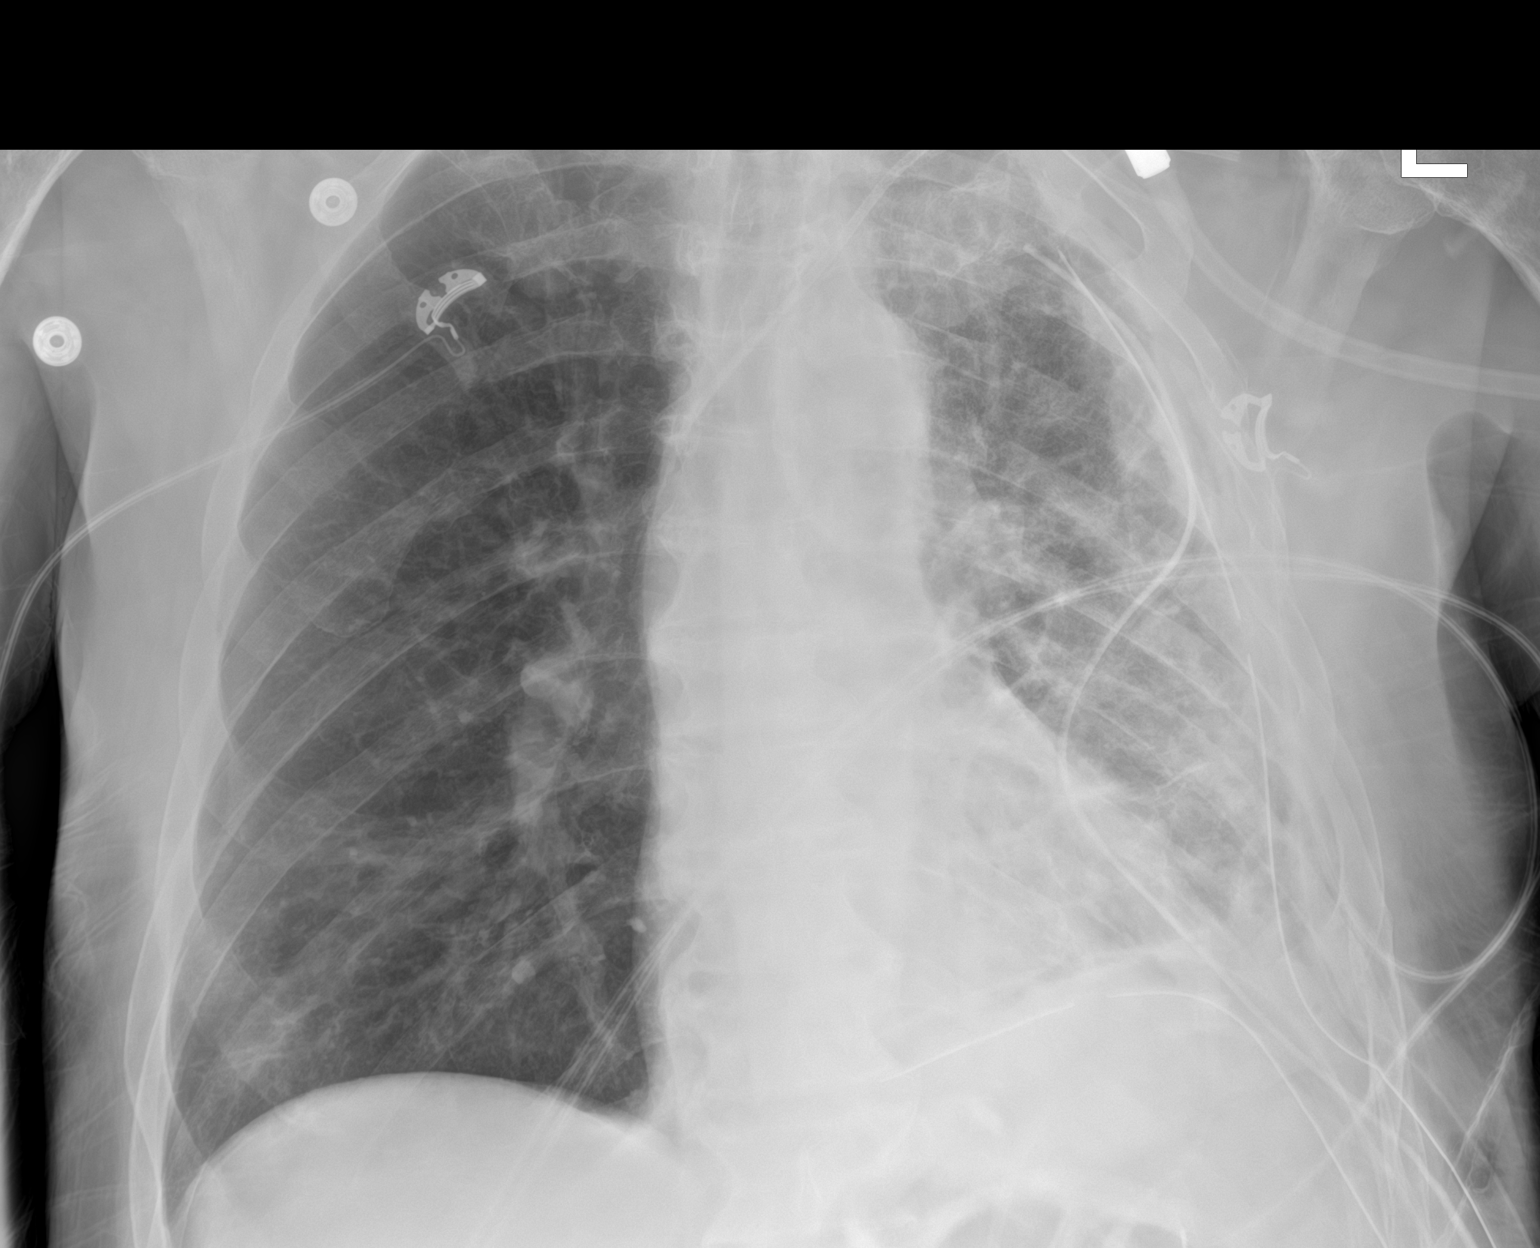

[1 of 1 positions shown; findings below may reference images not displayed]

FINDINGS: Cardiomediastinal silhouette within normal limits, with improved
visualization of the left mediastinum and left heart border.
Interval resolution of dense opacity of the left hemithorax.
Reticulonodular pattern of opacity throughout the left lung persist.
Similar appearance of coarsened interstitial markings on the right.

Interval placement of 3 thoracostomy tubes, within the sub pulmonic
and the left lateral pleural space.

Interval placement of left subclavian central venous catheter, with
the tip appearing to terminate in the SVC.

No pneumothorax.
IMPRESSION: Interval surgical changes of VATS and 3 left-sided thoracostomy
tubes for treatment of left pleural disease with no visualized
pneumothorax and interval resolution of the left pleural fluid.

Reticulonodular opacities of the left lung, potentially combination
of chronic and acute change.

Chronic changes of the right lung.

Interval placement of left subclavian central venous catheter with
the tip appearing to terminate SVC.

## 2021-12-28 IMAGING — DX DG CHEST 1V PORT
1 series · 2 of 2 positions shown · non-contrast
Comparison: October 26, 2020

CLINICAL DATA: Empyema.  Follow-up.

EXAM:
PORTABLE CHEST 1 VIEW

[Series 1: chest · 0.14mm/px · 2 of 2 slices shown]
[im 1/2]
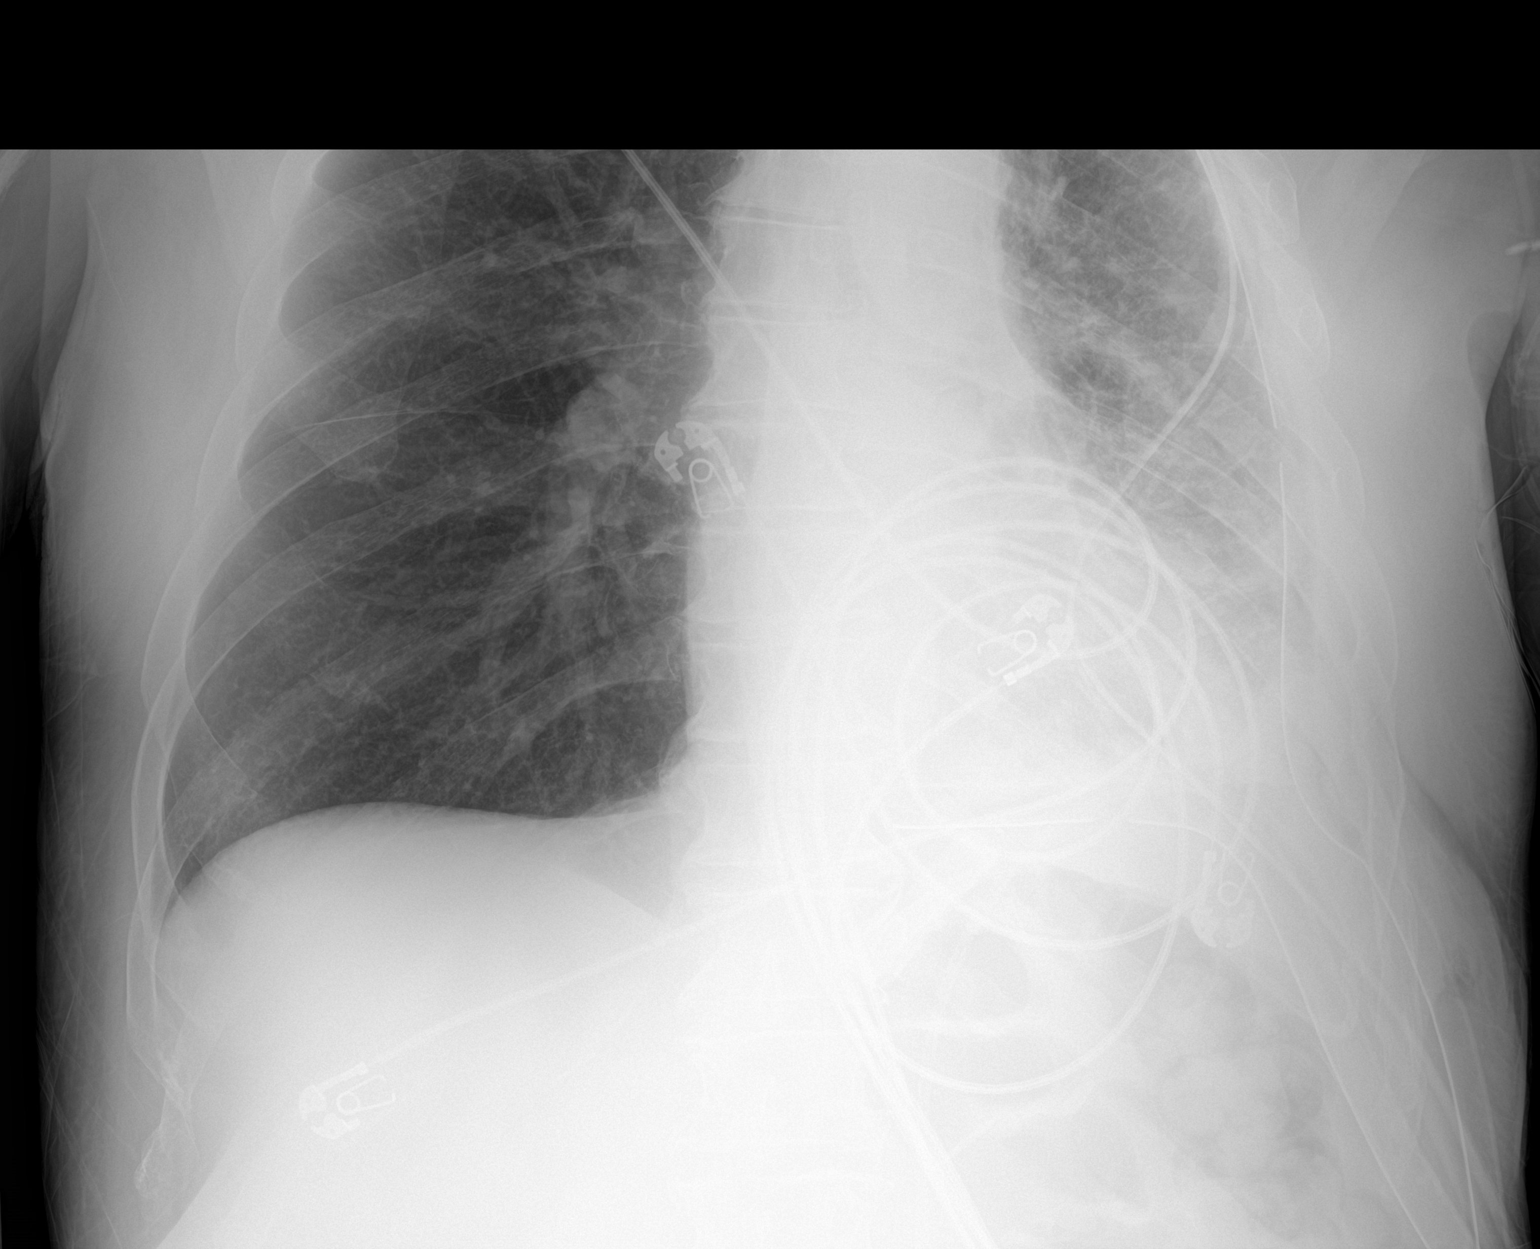
[im 2/2]
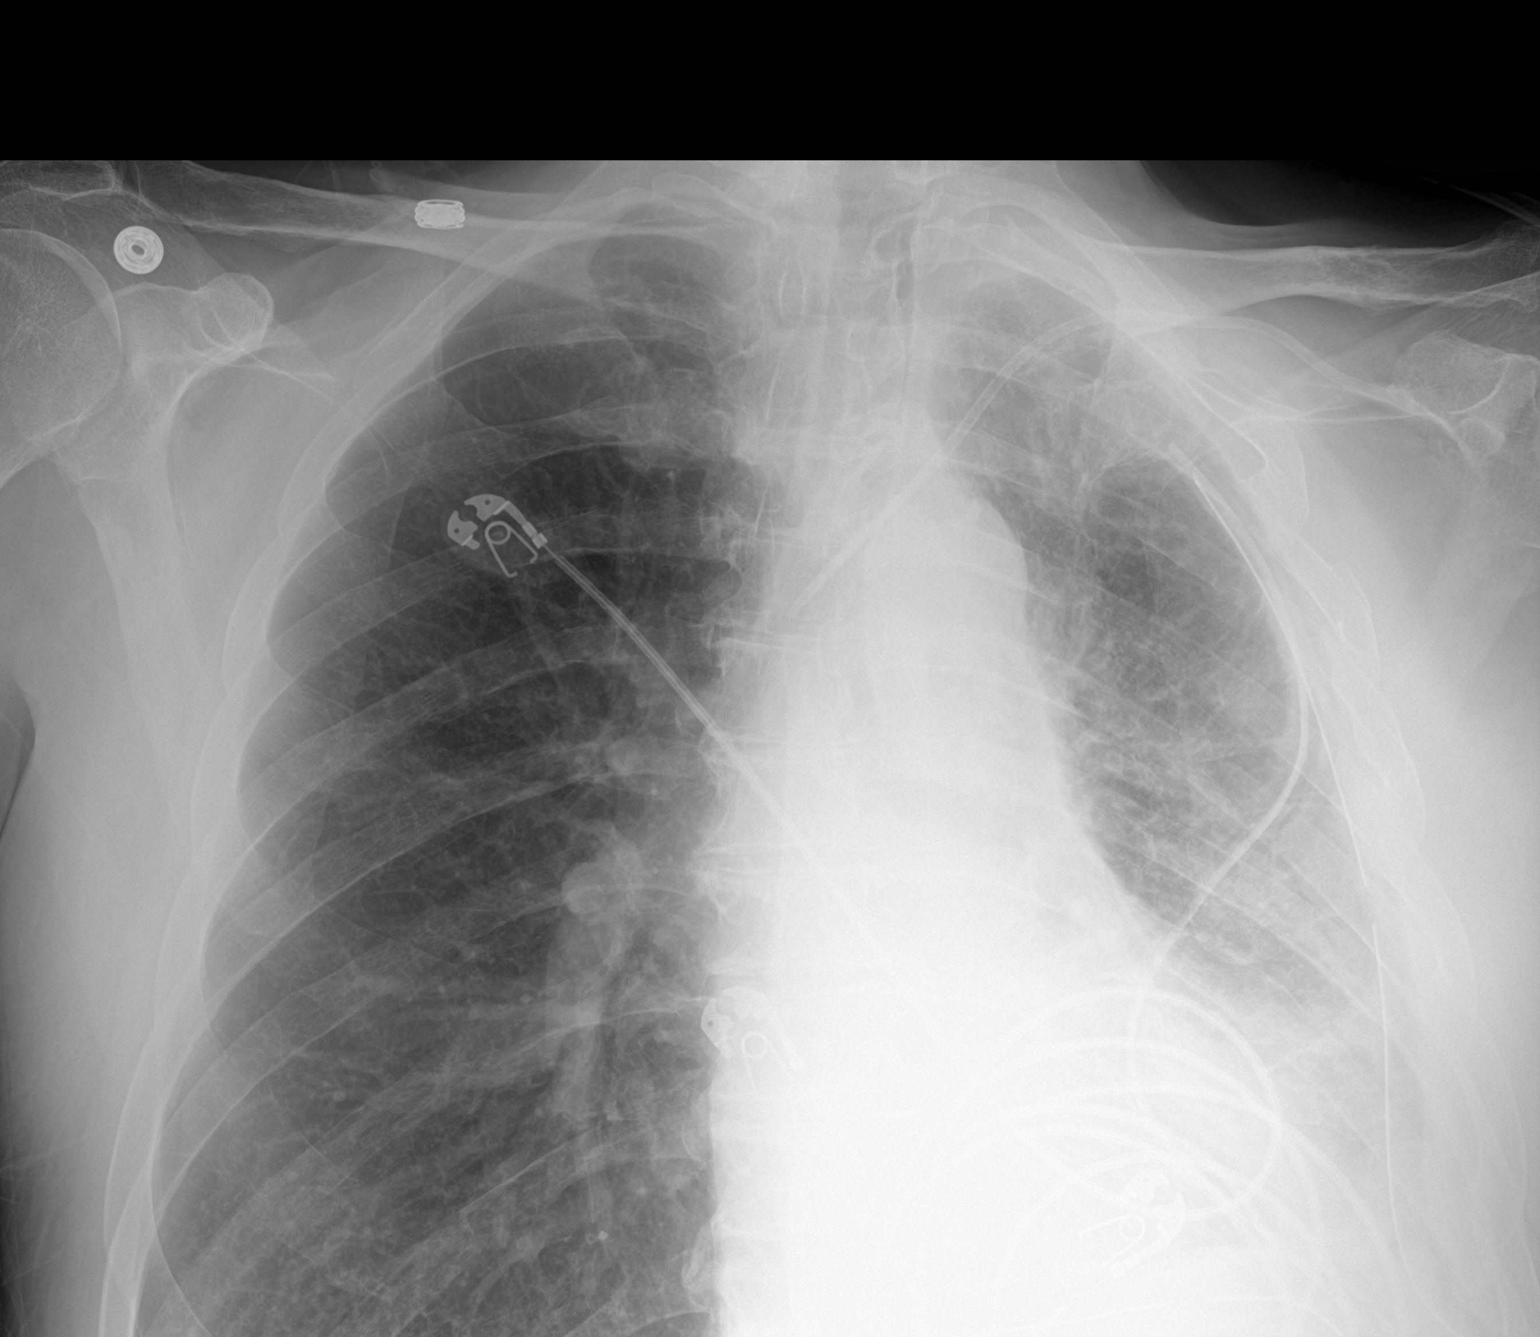

[2 of 2 positions shown; findings below may reference images not displayed]

FINDINGS: The left central line is stable. Three left chest tube remain in
stable position. No pneumothorax. Hyperinflation of the right lung
persists. No right-sided opacities. Mild haziness in the left lung
is stable. No acute interval changes.
IMPRESSION: 1. Support apparatus as above.
2. Haziness in the left lung is stable to mildly improved.
3. No pneumothorax.

## 2021-12-29 IMAGING — DX DG CHEST 1V PORT
1 series · 1 of 1 positions shown · non-contrast
Comparison: Chest radiograph 10/27/2020.

CLINICAL DATA: Chest tube in place.

EXAM:
PORTABLE CHEST 1 VIEW

[chest]
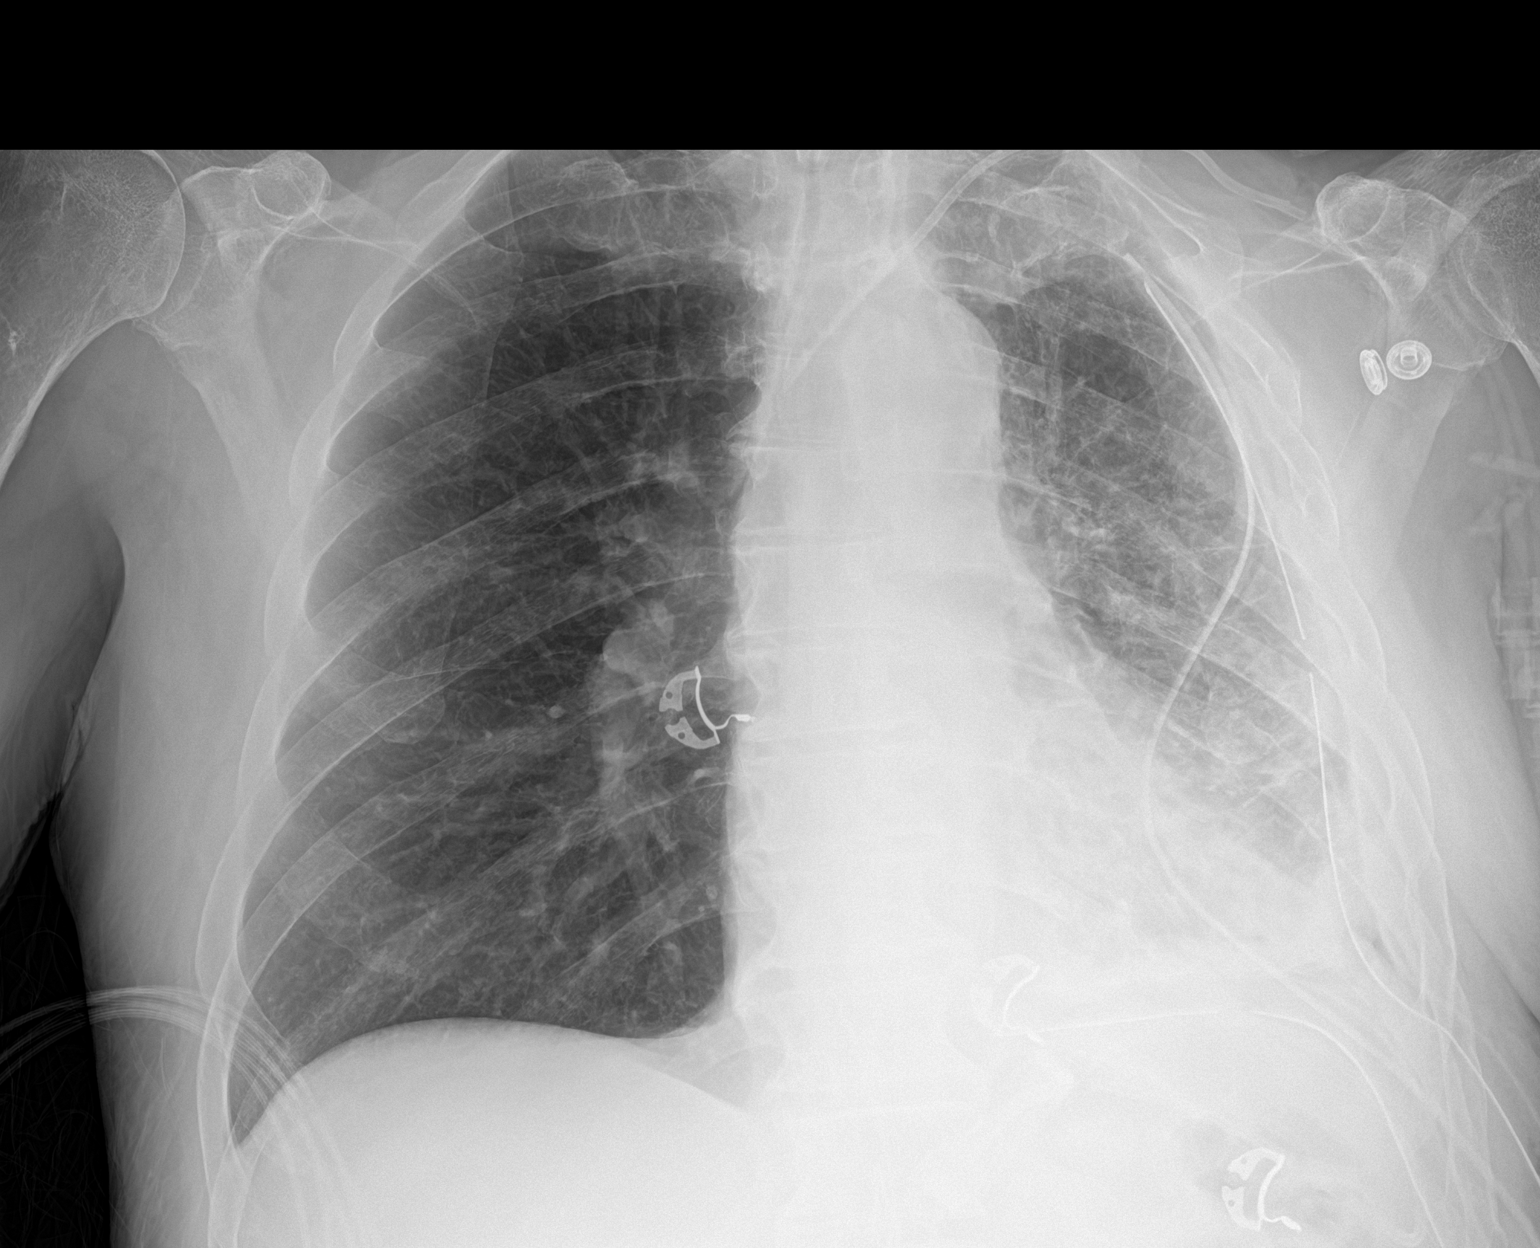

[1 of 1 positions shown; findings below may reference images not displayed]

FINDINGS: Left subclavian central venous catheter tip projects over the
central left brachiocephalic vein. Multiple left chest tubes in
place. Stable cardiac and mediastinal contours. Persistent
heterogeneous opacities left mid lower lung. Small amount of left
pleural fluid. Possible small amount of pleural gas left lower
hemithorax.
IMPRESSION: 1. Multiple left chest tubes in place. Possible small amount of
pleural gas left lower hemithorax.
2. Persistent left mid and lower lung heterogeneous opacities.

## 2021-12-30 ENCOUNTER — Other Ambulatory Visit: Payer: Self-pay | Admitting: Internal Medicine

## 2021-12-30 DIAGNOSIS — M5136 Other intervertebral disc degeneration, lumbar region: Secondary | ICD-10-CM

## 2022-01-10 DIAGNOSIS — H2513 Age-related nuclear cataract, bilateral: Secondary | ICD-10-CM | POA: Diagnosis not present

## 2022-01-10 DIAGNOSIS — H524 Presbyopia: Secondary | ICD-10-CM | POA: Diagnosis not present

## 2022-01-10 DIAGNOSIS — H04123 Dry eye syndrome of bilateral lacrimal glands: Secondary | ICD-10-CM | POA: Diagnosis not present

## 2022-01-10 DIAGNOSIS — D3131 Benign neoplasm of right choroid: Secondary | ICD-10-CM | POA: Diagnosis not present

## 2022-01-10 DIAGNOSIS — H43813 Vitreous degeneration, bilateral: Secondary | ICD-10-CM | POA: Diagnosis not present

## 2022-01-13 ENCOUNTER — Encounter: Payer: Self-pay | Admitting: Gastroenterology

## 2022-02-03 ENCOUNTER — Telehealth: Payer: Self-pay | Admitting: Internal Medicine

## 2022-02-03 ENCOUNTER — Other Ambulatory Visit: Payer: Self-pay | Admitting: Internal Medicine

## 2022-02-03 DIAGNOSIS — F5101 Primary insomnia: Secondary | ICD-10-CM

## 2022-02-03 NOTE — Telephone Encounter (Signed)
1.Medication Requested: triazolam (HALCION) 0.25 MG tablet ? ?2. Pharmacy (Name, Street, [id number] [place] [address] ?3. On Med List: Y ? ?4. Last Visit with PCP: 06-13-2021 ? ?5. Next visit date with PCP: n/a ? ?Encouraged pt to schedule an appt, pt declined due to "driving at the moment" ?

## 2022-02-04 NOTE — Telephone Encounter (Signed)
Pt scheduled for 4/10 @ 11.20am ?

## 2022-02-17 ENCOUNTER — Ambulatory Visit: Payer: PPO | Admitting: Internal Medicine

## 2022-03-22 ENCOUNTER — Other Ambulatory Visit: Payer: Self-pay | Admitting: Internal Medicine

## 2022-03-22 DIAGNOSIS — M5136 Other intervertebral disc degeneration, lumbar region: Secondary | ICD-10-CM

## 2022-03-24 ENCOUNTER — Ambulatory Visit (INDEPENDENT_AMBULATORY_CARE_PROVIDER_SITE_OTHER): Payer: PPO

## 2022-03-24 DIAGNOSIS — Z0000 Encounter for general adult medical examination without abnormal findings: Secondary | ICD-10-CM | POA: Diagnosis not present

## 2022-03-24 DIAGNOSIS — Z1211 Encounter for screening for malignant neoplasm of colon: Secondary | ICD-10-CM

## 2022-03-24 NOTE — Progress Notes (Signed)
?I connected with [name] [name] today by telephone and verified that I am speaking with the correct person using two identifiers. ?Location patient: home ?Location provider: work ?Persons participating in the virtual visit: patient, provider. ?  ?I discussed the limitations, risks, security and privacy concerns of performing an evaluation and management service by telephone and the availability of in person appointments. I also discussed with the patient that there may be a patient responsible charge related to this service. The patient expressed understanding and verbally consented to this telephonic visit.  ?  ?Interactive audio and video telecommunications were attempted between this provider and patient, however failed, due to patient having technical difficulties OR patient did not have access to video capability.  We continued and completed visit with audio only. ? ?Some vital signs may be absent or patient reported.  ? ?Time Spent with patient on telephone encounter: 30 minutes ? ?Subjective:  ? [name] [name] is a 66 y.o. male who presents for an Initial Medicare Annual Wellness Visit. ? ? ?Review of Systems    ? ?Cardiac Risk Factors include: advanced age (>5mn, >>31women);dyslipidemia ? ?   ?Objective:  ?  ?There were no vitals filed for this visit. ?There is no height or weight on file to calculate BMI. ? ? ?  03/24/2022  ?  3:49 PM 11/02/2020  ?  8:21 AM 10/30/2020  ?  5:00 PM 10/30/2020  ? 10:43 AM 10/18/2020  ?  8:19 AM 02/03/2017  ?  7:58 AM 01/23/2017  ?  8:35 AM  ?Advanced Directives  ?Does Patient Have a Medical Advance Directive? Yes No Yes Yes No No No  ?Type of Advance Directive Living will;Healthcare Power of Attorney  Living will Living will     ?Does patient want to make changes to medical advance directive? No - Patient declined No - Patient declined No - Patient declined No - Patient declined     ?Copy of [place] Chart? No - copy requested        ?Would patient like  information on creating a medical advance directive?  No - Patient declined       ? ? ?Current Medications (verified) ?Outpatient Encounter Medications as of 03/24/2022  ?Medication Sig  ? acetaminophen (TYLENOL) 650 MG CR tablet Take 650 mg by mouth every 8 (eight) hours as needed for pain.  ? cholecalciferol (VITAMIN D3) 25 MCG (1000 UNIT) tablet Take 1,000 Units by mouth daily.  ? feeding supplement (ENSURE ENLIVE / ENSURE PLUS) LIQD Take 237 mLs by mouth 3 (three) times daily with meals.  ? meloxicam (MOBIC) 7.5 MG tablet TAKE 1 TABLET BY MOUTH EVERY DAY  ? Multiple Vitamin (MULTIVITAMIN) tablet Take 1 tablet by mouth daily.  ? pantoprazole (PROTONIX) 40 MG tablet TAKE 1 TABLET BY MOUTH EVERY DAY  ? tadalafil (CIALIS) 20 MG tablet Take 1 tablet (20 mg total) by mouth daily as needed for erectile dysfunction.  ? traMADol (ULTRAM) 50 MG tablet TAKE 1 TABLET BY MOUTH EVERY 8 HOURS AS NEEDED.  ? triazolam (HALCION) 0.25 MG tablet TAKE 1 TABLET (0.25 MG TOTAL) BY MOUTH AT BEDTIME AS NEEDED.  ? Turmeric 450 MG CAPS Take 1 capsule by mouth 2 (two) times daily with a meal.  ? zinc gluconate ([facility]S ZINC GLUCONATE) 50 MG tablet TAKE 1 TABLET (50 MG TOTAL) BY MOUTH EVERY OTHER DAY.  ? ?No facility-administered encounter medications on file as of 03/24/2022.  ? ? ?Allergies (verified) ?Penicillins and Codeine  ? ?  History: ?Past Medical History:  ?Diagnosis Date  ? Allergy   ? Diabetes mellitus   ? type 2-off all meds now after wt loss  ? Empyema ([place]) 10/2020  ? GERD (gastroesophageal reflux disease)   ? Hyperlipidemia   ? Hyperlipidemia   ? Hypertension   ? Osteoarthritis   ? hands, mild, not disabling  ? ?Past Surgical History:  ?Procedure Laterality Date  ? Arthroscopy X 1 left ([name])    ? X 2 right  ? COLONOSCOPY    ? correction of deviated septum    ? DECORTICATION Left 10/26/2020  ? Procedure: DECORTICATION OF LEFT LUNG, APPLICATION OF ON Q-PUMP FOR PAIN MANAGEMENT;  Surgeon: Ivin Poot, MD;  Location: [facility];   Service: Thoracic;  Laterality: Left;  ? GANGLION CYST EXCISION    ? right wrist Fredna Dow)  ? GASTRIC BANDING PORT REVISION  8/11  ? PILONIDAL CYST / SINUS EXCISION  66 yrs old  ? TONSILLECTOMY    ? ULNAR COLLATERAL LIGAMENT REPAIR Right 09/21/2013  ? Procedure: ULNAR COLLATERAL LIGAMENT REPAIR METACARPAL PHALANGE RIGHT THUMB POSSIBLE APL GRAFT RECONSTRUCTION;  Surgeon: Wynonia Sours, MD;  Location: [facility];  Service: Orthopedics;  Laterality: Right;  ? VIDEO ASSISTED THORACOSCOPY (VATS)/EMPYEMA Left 10/26/2020  ? Procedure: VIDEO ASSISTED THORACOSCOPY (VATS)/EMPYEMA ,INSERTION OF LEFT PLEURAL CHEST TUBES X 3;  Surgeon: Ivin Poot, MD;  Location: [facility];  Service: Thoracic;  Laterality: Left;  ? ?Family History  ?Problem Relation Age of Onset  ? Cancer Father   ?     lung cancer metatstatic cancer to brain  ? Coronary artery disease Father   ? Heart attack Father   ?     X 2 (41,45)  ? Heart disease Father   ?     CAD/MI x 2  ? Cancer Mother   ?     rapid on set  ? Hypertension Mother   ? Parkinsonism Maternal Grandfather   ? Diabetes Neg Hx   ? Colon cancer Neg Hx   ? Colon polyps Neg Hx   ? Esophageal cancer Neg Hx   ? Rectal cancer Neg Hx   ? Stomach cancer Neg Hx   ? ?Social History  ? ?Socioeconomic History  ? Marital status: Married  ?  Spouse name: Not on file  ? Number of children: 2  ? Years of education: 27  ? Highest education level: Not on file  ?Occupational History  ? Occupation: Trucking   ?  Employer: [employer]  ?Tobacco Use  ? Smoking status: Former  ?  Types: Cigarettes  ?  Quit date: 06/13/1995  ?  Years since quitting: 26.7  ? Smokeless tobacco: Never  ?Vaping Use  ? Vaping Use: Never used  ?Substance and Sexual Activity  ? Alcohol use: No  ?  Alcohol/week: 0.0 standard drinks  ? Drug use: No  ? Sexual activity: Yes  ?  Partners: Female  ?Other Topics Concern  ? Not on file  ?Social History Narrative  ? [employer], [employer] business. Married '76  2 sons - '77, '80. Marriage  in good health. [place], plus a lot business. Very active and feeling.   ? ?Social Determinants of Health  ? ?Financial Resource Strain: Low Risk   ? Difficulty of Paying Living Expenses: Not hard at all  ?Food Insecurity: No Food Insecurity  ? Worried About Charity fundraiser in the Last Year: Never true  ? Ran Out of  Food in the Last Year: Never true  ?Transportation Needs: No Transportation Needs  ? Lack of Transportation (Medical): No  ? Lack of Transportation (Non-Medical): No  ?Physical Activity: Sufficiently Active  ? Days of Exercise per Week: 3 days  ? Minutes of Exercise per Session: 60 min  ?Stress: No Stress Concern Present  ? Feeling of Stress : Not at all  ?Social Connections: Unknown  ? Frequency of Communication with Friends and Family: More than three times a week  ? Frequency of Social Gatherings with Friends and Family: More than three times a week  ? Attends Religious Services: Patient refused  ? Active Member of Clubs or Organizations: Yes  ? Attends Archivist Meetings: Patient refused  ? Marital Status: Married  ? ? ?Tobacco Counseling ?Counseling given: Not Answered ? ? ?Clinical Intake: ? ?Pre-visit preparation completed: Yes ? ?Pain : No/denies pain ? ?  ? ?Nutritional Risks: None ?Diabetes: No ? ?How often do you need to have someone help you when you read instructions, pamphlets, or other written materials from your doctor or pharmacy?: 1 - Never ?What is the last grade level you completed in school?: [employer]; B. A. in [place] from [employer] ? ?Diabetic? no ? ?Interpreter Needed?: No ? ?Information entered by :: Lisette Abu, LPN ? ? ?Activities of Daily Living ? ?  03/24/2022  ?  4:02 PM  ?In your present state of health, do you have any difficulty performing the following activities:  ?Hearing? 0  ?Vision? 0  ?Difficulty concentrating or making decisions? 0  ?Walking or climbing stairs? 0  ?Dressing or bathing? 0  ?Doing errands, shopping? 0   ?Preparing Food and eating ? N  ?Using the Toilet? N  ?In the past six months, have you accidently leaked urine? N  ?Do you have problems with loss of bowel control? N  ?Managing your Medications? N  ?

## 2022-03-24 NOTE — Patient Instructions (Signed)
[name] , ?Thank you for taking time to come for your Medicare Wellness Visit. I appreciate your ongoing commitment to your health goals. Please review the following plan we discussed and let me know if I can assist you in the future.  ? ?Screening recommendations/referrals: ?Colonoscopy: 02/04/2017; due every 5 years; ordered 03/24/2022 ?Recommended yearly ophthalmology/optometry visit for glaucoma screening and checkup ?Recommended yearly dental visit for hygiene and checkup ? ?Vaccinations: ?Influenza vaccine: 08/12/2021 ?Pneumococcal vaccine: 07/24/2019, 11/13/2020 ?Tdap vaccine: 03/07/2020, due every 10 years ?Shingles vaccine: 12/09/2018, 07/24/2019   ?Covid-19: 01/19/2020, 02/13/2020, 08/14/2020, 04/06/2021, 08/12/2021 ? ?Advanced directives: Yes; Please bring a copy of your health care power of attorney and living will to the office at your convenience. ? ?Conditions/risks identified: Yes ? ?Next appointment: Please schedule your next Medicare Wellness Visit with your Nurse Health Advisor in 1 year by calling [phone]. ? ?Preventive Care 100 Years and Older, Male ?Preventive care refers to lifestyle choices and visits with your health care provider that can promote health and wellness. ?What does preventive care include? ?A yearly physical exam. This is also called an annual well check. ?Dental exams once or twice a year. ?Routine eye exams. Ask your health care provider how often you should have your eyes checked. ?Personal lifestyle choices, including: ?Daily care of your teeth and gums. ?Regular physical activity. ?Eating a healthy diet. ?Avoiding tobacco and drug use. ?Limiting alcohol use. ?Practicing safe sex. ?Taking low doses of aspirin every day. ?Taking vitamin and mineral supplements as recommended by your health care provider. ?What happens during an annual well check? ?The services and screenings done by your health care provider during your annual well check will depend on your age, overall health,  lifestyle risk factors, and family history of disease. ?Counseling  ?Your health care provider may ask you questions about your: ?Alcohol use. ?Tobacco use. ?Drug use. ?Emotional well-being. ?Home and relationship well-being. ?Sexual activity. ?Eating habits. ?History of falls. ?Memory and ability to understand (cognition). ?Work and work Statistician. ?Screening  ?You may have the following tests or measurements: ?Height, weight, and BMI. ?Blood pressure. ?Lipid and cholesterol levels. These may be checked every 5 years, or more frequently if you are over 41 years old. ?Skin check. ?Lung cancer screening. You may have this screening every year starting at age 65 if you have a 30-pack-year history of smoking and currently smoke or have quit within the past 15 years. ?Fecal occult blood test (FOBT) of the stool. You may have this test every year starting at age 24. ?Flexible sigmoidoscopy or colonoscopy. You may have a sigmoidoscopy every 5 years or a colonoscopy every 10 years starting at age 68. ?Prostate cancer screening. Recommendations will vary depending on your family history and other risks. ?Hepatitis C blood test. ?Hepatitis B blood test. ?Sexually transmitted disease (STD) testing. ?Diabetes screening. This is done by checking your blood sugar (glucose) after you have not eaten for a while (fasting). You may have this done every 1-3 years. ?Abdominal aortic aneurysm (AAA) screening. You may need this if you are a current or former smoker. ?Osteoporosis. You may be screened starting at age 14 if you are at high risk. ?Talk with your health care provider about your test results, treatment options, and if necessary, the need for more tests. ?Vaccines  ?Your health care provider may recommend certain vaccines, such as: ?Influenza vaccine. This is recommended every year. ?Tetanus, diphtheria, and acellular pertussis (Tdap, Td) vaccine. You may need a Td booster every 10 years. ?Zoster  vaccine. You may need this  after age 71. ?Pneumococcal 13-valent conjugate (PCV13) vaccine. One dose is recommended after age 85. ?Pneumococcal polysaccharide (PPSV23) vaccine. One dose is recommended after age 2. ?Talk to your health care provider about which screenings and vaccines you need and how often you need them. ?This information is not intended to replace advice given to you by your health care provider. Make sure you discuss any questions you have with your health care provider. ?Document Released: 11/23/2015 Document Revised: 07/16/2016 Document Reviewed: 08/28/2015 ?Elsevier Interactive Patient Education ? 2017 [employer]. ? ?Fall Prevention in the Home ?Falls can cause injuries. They can happen to people of all ages. There are many things you can do to make your home safe and to help prevent falls. ?What can I do on the outside of my home? ?Regularly fix the edges of walkways and driveways and fix any cracks. ?Remove anything that might make you trip as you walk through a door, such as a raised step or threshold. ?Trim any bushes or trees on the path to your home. ?Use bright outdoor lighting. ?Clear any walking paths of anything that might make someone trip, such as rocks or tools. ?Regularly check to see if handrails are loose or broken. Make sure that both sides of any steps have handrails. ?Any raised decks and porches should have guardrails on the edges. ?Have any leaves, snow, or ice cleared regularly. ?Use sand or salt on walking paths during winter. ?Clean up any spills in your garage right away. This includes oil or grease spills. ?What can I do in the bathroom? ?Use night lights. ?Install grab bars by the toilet and in the tub and shower. Do not use towel bars as grab bars. ?Use non-skid mats or decals in the tub or shower. ?If you need to sit down in the shower, use a plastic, non-slip stool. ?Keep the floor dry. Clean up any water that spills on the floor as soon as it happens. ?Remove soap buildup in the tub or  shower regularly. ?Attach bath mats securely with double-sided non-slip rug tape. ?Do not have throw rugs and other things on the floor that can make you trip. ?What can I do in the bedroom? ?Use night lights. ?Make sure that you have a light by your bed that is easy to reach. ?Do not use any sheets or blankets that are too big for your bed. They should not hang down onto the floor. ?Have a firm chair that has side arms. You can use this for support while you get dressed. ?Do not have throw rugs and other things on the floor that can make you trip. ?What can I do in the kitchen? ?Clean up any spills right away. ?Avoid walking on wet floors. ?Keep items that you use a lot in easy-to-reach places. ?If you need to reach something above you, use a strong step stool that has a grab bar. ?Keep electrical cords out of the way. ?Do not use floor polish or wax that makes floors slippery. If you must use wax, use non-skid floor wax. ?Do not have throw rugs and other things on the floor that can make you trip. ?What can I do with my stairs? ?Do not leave any items on the stairs. ?Make sure that there are handrails on both sides of the stairs and use them. Fix handrails that are broken or loose. Make sure that handrails are as long as the stairways. ?Check any carpeting to make sure that it  is firmly attached to the stairs. Fix any carpet that is loose or worn. ?Avoid having throw rugs at the top or bottom of the stairs. If you do have throw rugs, attach them to the floor with carpet tape. ?Make sure that you have a light switch at the top of the stairs and the bottom of the stairs. If you do not have them, ask someone to add them for you. ?What else can I do to help prevent falls? ?Wear shoes that: ?Do not have high heels. ?Have rubber bottoms. ?Are comfortable and fit you well. ?Are closed at the toe. Do not wear sandals. ?If you use a stepladder: ?Make sure that it is fully opened. Do not climb a closed stepladder. ?Make  sure that both sides of the stepladder are locked into place. ?Ask someone to hold it for you, if possible. ?Clearly mark and make sure that you can see: ?Any grab bars or handrails. ?First and last steps. ?Wh

## 2022-04-24 ENCOUNTER — Ambulatory Visit: Payer: PPO | Admitting: Internal Medicine

## 2022-05-15 ENCOUNTER — Encounter: Payer: Self-pay | Admitting: Internal Medicine

## 2022-05-15 ENCOUNTER — Ambulatory Visit (INDEPENDENT_AMBULATORY_CARE_PROVIDER_SITE_OTHER): Payer: PPO | Admitting: Internal Medicine

## 2022-05-15 VITALS — BP 166/100 | HR 64 | Temp 97.6°F | Ht 74.0 in | Wt 226.0 lb

## 2022-05-15 DIAGNOSIS — I4510 Unspecified right bundle-branch block: Secondary | ICD-10-CM | POA: Insufficient documentation

## 2022-05-15 DIAGNOSIS — M19042 Primary osteoarthritis, left hand: Secondary | ICD-10-CM | POA: Diagnosis not present

## 2022-05-15 DIAGNOSIS — E519 Thiamine deficiency, unspecified: Secondary | ICD-10-CM | POA: Diagnosis not present

## 2022-05-15 DIAGNOSIS — R9431 Abnormal electrocardiogram [ECG] [EKG]: Secondary | ICD-10-CM | POA: Diagnosis not present

## 2022-05-15 DIAGNOSIS — D508 Other iron deficiency anemias: Secondary | ICD-10-CM | POA: Diagnosis not present

## 2022-05-15 DIAGNOSIS — F5101 Primary insomnia: Secondary | ICD-10-CM

## 2022-05-15 DIAGNOSIS — M19041 Primary osteoarthritis, right hand: Secondary | ICD-10-CM

## 2022-05-15 DIAGNOSIS — R768 Other specified abnormal immunological findings in serum: Secondary | ICD-10-CM | POA: Diagnosis not present

## 2022-05-15 DIAGNOSIS — R7303 Prediabetes: Secondary | ICD-10-CM | POA: Insufficient documentation

## 2022-05-15 DIAGNOSIS — I10 Essential (primary) hypertension: Secondary | ICD-10-CM | POA: Diagnosis not present

## 2022-05-15 DIAGNOSIS — E785 Hyperlipidemia, unspecified: Secondary | ICD-10-CM

## 2022-05-15 DIAGNOSIS — E039 Hypothyroidism, unspecified: Secondary | ICD-10-CM

## 2022-05-15 DIAGNOSIS — E60 Dietary zinc deficiency: Secondary | ICD-10-CM

## 2022-05-15 LAB — CBC WITH DIFFERENTIAL/PLATELET
Basophils Absolute: 0 10*3/uL (ref 0.0–0.1)
Basophils Relative: 0.3 % (ref 0.0–3.0)
Eosinophils Absolute: 0.2 10*3/uL (ref 0.0–0.7)
Eosinophils Relative: 2.3 % (ref 0.0–5.0)
HCT: 39.8 % (ref 39.0–52.0)
Hemoglobin: 13 g/dL (ref 13.0–17.0)
Lymphocytes Relative: 32.1 % (ref 12.0–46.0)
Lymphs Abs: 2.6 10*3/uL (ref 0.7–4.0)
MCHC: 32.7 g/dL (ref 30.0–36.0)
MCV: 90.3 fl (ref 78.0–100.0)
Monocytes Absolute: 0.8 10*3/uL (ref 0.1–1.0)
Monocytes Relative: 10.4 % (ref 3.0–12.0)
Neutro Abs: 4.4 10*3/uL (ref 1.4–7.7)
Neutrophils Relative %: 54.9 % (ref 43.0–77.0)
Platelets: 159 10*3/uL (ref 150.0–400.0)
RBC: 4.41 Mil/uL (ref 4.22–5.81)
RDW: 13.6 % (ref 11.5–15.5)
WBC: 8 10*3/uL (ref 4.0–10.5)

## 2022-05-15 LAB — BASIC METABOLIC PANEL
BUN: 24 mg/dL — ABNORMAL HIGH (ref 6–23)
CO2: 30 mEq/L (ref 19–32)
Calcium: 9.5 mg/dL (ref 8.4–10.5)
Chloride: 104 mEq/L (ref 96–112)
Creatinine, Ser: 0.89 mg/dL (ref 0.40–1.50)
GFR: 89.06 mL/min (ref >60.00)
Glucose, Bld: 83 mg/dL (ref 70–99)
Potassium: 4.5 mEq/L (ref 3.5–5.1)
Sodium: 139 mEq/L (ref 135–145)

## 2022-05-15 LAB — LIPID PANEL
Cholesterol: 175 mg/dL (ref 0–200)
HDL: 47.6 mg/dL (ref >39.00)
LDL Cholesterol: 97 mg/dL (ref 0–99)
NonHDL: 127.08
Total CHOL/HDL Ratio: 4
Triglycerides: 152 mg/dL — ABNORMAL HIGH (ref 0.0–149.0)
VLDL: 30.4 mg/dL (ref 0.0–40.0)

## 2022-05-15 LAB — C-REACTIVE PROTEIN: CRP: <1 mg/dL (ref 0.5–20.0)

## 2022-05-15 LAB — BRAIN NATRIURETIC PEPTIDE: Pro B Natriuretic peptide (BNP): 85 pg/mL (ref 0.0–100.0)

## 2022-05-15 LAB — TSH: TSH: 2.13 u[IU]/mL (ref 0.35–5.50)

## 2022-05-15 LAB — IBC + FERRITIN
Ferritin: 52.8 ng/mL (ref 22.0–322.0)
Iron: 149 ug/dL (ref 42–165)
Saturation Ratios: 42.6 % (ref 20.0–50.0)
TIBC: 350 ug/dL (ref 250.0–450.0)
Transferrin: 250 mg/dL (ref 212.0–360.0)

## 2022-05-15 LAB — TROPONIN I (HIGH SENSITIVITY): High Sens Troponin I: 7 ng/L (ref 2–17)

## 2022-05-15 MED ORDER — TRIAZOLAM 0.25 MG PO TABS: 0.2500 mg | ORAL_TABLET | Freq: Every evening | ORAL | 1 refills | Status: DC | PRN

## 2022-05-15 NOTE — Progress Notes (Signed)
Subjective:  Patient ID: [name] [name], male    DOB: [birth year]  Age: 66 y.o. MRN: [id number]  CC: Hypertension, Hyperlipidemia, and Anemia   HPI LINTON STOLP presents for f/up -  He complains of pain and stiffness in his hands and fingers that is not responding to meloxicam.  He says the joints never get red, swollen, or puffy.  He is active and denies chest pain, shortness of breath, diaphoresis, dizziness, lightheadedness, or edema.  Outpatient Medications Prior to Visit  Medication Sig Dispense Refill   acetaminophen (TYLENOL) 650 MG CR tablet Take 650 mg by mouth every 8 (eight) hours as needed for pain.     cholecalciferol (VITAMIN D3) 25 MCG (1000 UNIT) tablet Take 1,000 Units by mouth daily.     Multiple Vitamin (MULTIVITAMIN) tablet Take 1 tablet by mouth daily.     pantoprazole (PROTONIX) 40 MG tablet TAKE 1 TABLET BY MOUTH EVERY DAY 90 tablet 1   tadalafil (CIALIS) 20 MG tablet Take 1 tablet (20 mg total) by mouth daily as needed for erectile dysfunction. 20 tablet 5   traMADol (ULTRAM) 50 MG tablet TAKE 1 TABLET BY MOUTH EVERY 8 HOURS AS NEEDED. 90 tablet 2   Turmeric 450 MG CAPS Take 1 capsule by mouth 2 (two) times daily with a meal. 180 capsule 3   zinc gluconate ([employer] ZINC GLUCONATE) 50 MG tablet TAKE 1 TABLET (50 MG TOTAL) BY MOUTH EVERY OTHER DAY. 45 tablet 1   meloxicam (MOBIC) 7.5 MG tablet TAKE 1 TABLET BY MOUTH EVERY DAY 90 tablet 0   triazolam (HALCION) 0.25 MG tablet TAKE 1 TABLET (0.25 MG TOTAL) BY MOUTH AT BEDTIME AS NEEDED. 90 tablet 1   feeding supplement (ENSURE ENLIVE / ENSURE PLUS) LIQD Take 237 mLs by mouth 3 (three) times daily with meals. 237 mL 12   No facility-administered medications prior to visit.    ROS Review of Systems  Constitutional:  Positive for unexpected weight change (wt gain). Negative for appetite change, chills, diaphoresis and fatigue.  HENT: Negative.    Eyes: Negative.   Respiratory:  Negative for cough, chest tightness,  shortness of breath and wheezing.   Cardiovascular:  Negative for chest pain and palpitations.  Gastrointestinal:  Negative for abdominal pain, constipation, diarrhea, nausea and vomiting.  Endocrine: Negative.   Genitourinary: Negative.  Negative for difficulty urinating.  Musculoskeletal:  Positive for arthralgias. Negative for back pain, joint swelling and myalgias.  Skin: Negative.   Neurological:  Negative for dizziness, weakness, light-headedness and headaches.  Hematological:  Negative for adenopathy. Does not bruise/bleed easily.  Psychiatric/Behavioral:  Positive for sleep disturbance. Negative for decreased concentration. The patient is not nervous/anxious.     Objective:  BP (!) 166/100 (BP Location: Left Arm, Patient Position: Sitting, Cuff Size: Large)   Pulse 64   Temp 97.6 F (36.4 C) (Oral)   Ht '6\' 2"'$  (1.88 m)   Wt 226 lb (102.5 kg)   SpO2 100%   BMI 29.02 kg/m   BP Readings from Last 3 Encounters:  05/15/22 (!) 166/100  06/13/21 138/82  12/24/20 107/69    Wt Readings from Last 3 Encounters:  05/15/22 226 lb (102.5 kg)  12/24/20 188 lb (85.3 kg)  12/07/20 187 lb (84.8 kg)    Physical Exam Vitals reviewed.  HENT:     Nose: Nose normal.     Mouth/Throat:     Mouth: Mucous membranes are moist.  Eyes:     General: No scleral icterus.  Conjunctiva/sclera: Conjunctivae normal.  Cardiovascular:     Rate and Rhythm: Regular rhythm. Bradycardia present.     Heart sounds: No murmur heard.    Comments: EKG- SB, 59 bpm RBBB - new Septal infarct pattern - new No LVH Pulmonary:     Effort: Pulmonary effort is normal.     Breath sounds: No stridor. No wheezing, rhonchi or rales.  Abdominal:     General: Abdomen is flat.     Palpations: There is no mass.     Tenderness: There is no abdominal tenderness. There is no guarding.     Hernia: No hernia is present.  Musculoskeletal:        General: Normal range of motion.     Cervical back: Neck supple.      Right lower leg: No edema.     Left lower leg: No edema.  Lymphadenopathy:     Cervical: No cervical adenopathy.  Skin:    General: Skin is warm and dry.  Neurological:     General: No focal deficit present.  Psychiatric:        Mood and Affect: Mood normal.        Behavior: Behavior normal.     Lab Results  Component Value Date   WBC 8.0 05/15/2022   HGB 13.0 05/15/2022   HCT 39.8 05/15/2022   PLT 159.0 05/15/2022   GLUCOSE 83 05/15/2022   CHOL 175 05/15/2022   TRIG 152.0 (H) 05/15/2022   HDL 47.60 05/15/2022   LDLDIRECT 85.0 06/13/2021   LDLCALC 97 05/15/2022   ALT 5 11/13/2020   AST 23 11/13/2020   NA 139 05/15/2022   K 4.5 05/15/2022   CL 104 05/15/2022   CREATININE 0.89 05/15/2022   BUN 24 (H) 05/15/2022   CO2 30 05/15/2022   TSH 2.13 05/15/2022   PSA 0.35 06/13/2021   INR 1.0 11/13/2020   HGBA1C 5.8 05/15/2022   MICROALBUR 4.2 (H) 12/30/2007    CT ANGIO CHEST AORTA W/CM & OR WO/CM  Result Date: 10/31/2021 CLINICAL DATA:  Follow-up aortic root dilatation. EXAM: CT ANGIOGRAPHY CHEST WITH CONTRAST TECHNIQUE: Multidetector CT imaging of the chest was performed using the standard protocol during bolus administration of intravenous contrast. Multiplanar CT image reconstructions and MIPs were obtained to evaluate the vascular anatomy. CONTRAST:  77m OMNIPAQUE IOHEXOL 350 MG/ML SOLN COMPARISON:  11/14/2020; 10/25/2020; 09/25/2011 FINDINGS: Vascular Findings: Mild fusiform aneurysmal dilatation of the ascending thoracic aorta with measurements as follows. The thoracic aorta tapers to a normal caliber at the level of the aortic arch. Scattered calcified atherosclerotic plaque involving the aortic arch and proximal descending thoracic aorta, not resulting in a hemodynamically significant stenosis. No evidence of thoracic aortic dissection or perivascular stranding on this nongated examination. Conventional configuration of the aortic arch. The branch vessels of the aortic  arch, in particular, the left common carotid artery, are tortuous though appear patent throughout their imaged courses. Cardiomegaly. Coronary artery calcifications. No pericardial effusion. Although this examination was not tailored for the evaluation the pulmonary arteries, there are no discrete filling defects within the central pulmonary arterial tree to suggest central pulmonary embolism. Normal caliber of the main pulmonary artery. ------------------------------------------------------------- Thoracic aortic measurements: SINOTUBULAR JUNCTION: 37 mm as measured in greatest oblique short axis coronal dimension. PROXIMAL ASCENDING THORACIC AORTA: 42 mm as measured in greatest oblique short axis axial dimension at the level of the main pulmonary artery (image 44, series 4) and approximately 42 mm in greatest oblique short axis coronal diameter (coronal image  56, series 7), similar to the 09/2011 examination. AORTIC ARCH: 32 mm as measured in greatest oblique short axis sagittal dimension. PROXIMAL DESCENDING THORACIC AORTA: 30 mm as measured in greatest oblique short axis axial dimension at the level of the main pulmonary artery. DISTAL DESCENDING THORACIC AORTA: 27 mm as measured in greatest oblique short axis axial dimension at the level of the diaphragmatic hiatus. Review of the MIP images confirms the above findings. ------------------------------------------------------------- Non-Vascular Findings: Mediastinum/Lymph Nodes: No bulky mediastinal, hilar or axillary lymphadenopathy. Lungs/Pleura: Resolved left-sided residual loculated hydropneumothorax and pleuroparenchymal thickening. Minimal residual subsegmental atelectasis involving the left costophrenic angle. No new discrete focal airspace opacities. Punctate (approximate 0.5 cm) nodule within the peripheral aspect of the right upper lobe (image 54, series 6), appears similar to remote examination performed in 2012 and thus is of benign etiology. No  discrete worrisome pulmonary nodules. No pleural effusion or pneumothorax. The central pulmonary airways appear widely patent Upper abdomen: Limited early arterial phase evaluation of the upper abdomen suggests mild nodularity hepatic contour and reduced attenuation of the hepatic parenchyma suggestive of hepatic steatosis. Sequela of previous gastric lap banding with moderate-sized hiatal hernia. Note is made of a small splenule about the anterior inferior aspect of the spleen. Musculoskeletal: No acute or aggressive osseous abnormalities. Stigmata dish within the thoracic spine. Regional soft tissues appear normal. Normal appearance of the thyroid gland. IMPRESSION: 1. Stable uncomplicated mild fusiform aneurysmal dilatation of the ascending thoracic aorta measuring 42 mm in diameter, similar to the 09/2011 examination. Aortic aneurysm NOS (ICD10-I71.9). 2. Cardiomegaly. 3. Coronary calcifications.  Aortic Atherosclerosis (ICD10-I70.0). 4. Improved aeration of the left lung with interval resolution of small residual loculated left-sided hydropneumothorax and pleuroparenchymal thickening. 5. Sequela of previous gastric lap banding with moderate-sized hiatal hernia. Electronically Signed   By: Sandi Mariscal M.D.   On: 10/31/2021 11:09    Assessment & Plan:   Mattthew was seen today for hypertension, hyperlipidemia and anemia.  Diagnoses and all orders for this visit:  Essential hypertension- His blood pressure is not adequately well controlled.  Will check labs to screen for secondary causes and endorgan damage and will start an ARB. -     Basic metabolic panel; Future -     TSH; Future -     Urinalysis, Routine w reflex microscopic; Future -     Aldosterone + renin activity w/ ratio; Future -     EKG 12-Lead -     Aldosterone + renin activity w/ ratio -     Urinalysis, Routine w reflex microscopic -     TSH -     Basic metabolic panel -     olmesartan (BENICAR) 20 MG tablet; Take 1 tablet (20 mg  total) by mouth daily.  Acquired hypothyroidism- He is euthyroid. -     TSH; Future -     TSH  Prediabetes- His A1C is normal. -     Basic metabolic panel; Future -     Hemoglobin A1c; Future -     Hemoglobin A1c -     Basic metabolic panel  Primary insomnia -     triazolam (HALCION) 0.25 MG tablet; Take 1 tablet (0.25 mg total) by mouth at bedtime as needed.  Zinc deficiency -     CBC with Differential/Platelet; Future -     Zinc; Future -     Zinc -     CBC with Differential/Platelet  Iron deficiency anemia due to dietary causes -     IBC +  Ferritin; Future -     CBC with Differential/Platelet; Future -     CBC with Differential/Platelet -     IBC + Ferritin  Primary osteoarthritis of both hands- Will check labs to screen for inflammatory arthropathies. -     Rheumatoid factor; Future -     Cyclic citrul peptide antibody, IgG; Future -     ANA; Future -     C-reactive protein; Future -     C-reactive protein -     ANA -     Cyclic citrul peptide antibody, IgG -     Rheumatoid factor  Hyperlipidemia with target LDL less than 100- I have asked him to take a statin for cardiovascular risk reduction. -     Lipid panel; Future -     Lipid panel  Thiamine deficiency -     Vitamin B1; Future -     Vitamin B1  Abnormal electrocardiogram (ECG) (EKG)- His labs are reassuring.  I recommended that he undergo a Lexiscan to evaluate for ischemia. -     Troponin I (High Sensitivity); Future -     Brain natriuretic peptide; Future -     Brain natriuretic peptide -     Troponin I (High Sensitivity) -     Cardiac Stress Test: Informed Consent Details: Physician/Practitioner Attestation; Transcribe to consent form and obtain patient signature; Future -     MYOCARDIAL PERFUSION IMAGING; Future  New onset right bundle branch block (RBBB) -     Cardiac Stress Test: Informed Consent Details: Physician/Practitioner Attestation; Transcribe to consent form and obtain patient signature;  Future -     MYOCARDIAL PERFUSION IMAGING; Future  Abnormal electrocardiogram -     MYOCARDIAL PERFUSION IMAGING; Future   I have discontinued Nathaneil Canary D. Benzel "Doug"'s feeding supplement and meloxicam. I am also having him start on olmesartan and rosuvastatin. Additionally, I am having him maintain his Turmeric, multivitamin, cholecalciferol, acetaminophen, zinc gluconate, tadalafil, pantoprazole, traMADol, and triazolam.  Meds ordered this encounter  Medications   triazolam (HALCION) 0.25 MG tablet    Sig: Take 1 tablet (0.25 mg total) by mouth at bedtime as needed.    Dispense:  90 tablet    Refill:  1    Not to exceed 4 additional fills before 08/11/2021 DX Code Needed  .   olmesartan (BENICAR) 20 MG tablet    Sig: Take 1 tablet (20 mg total) by mouth daily.    Dispense:  90 tablet    Refill:  0   rosuvastatin (CRESTOR) 10 MG tablet    Sig: Take 1 tablet (10 mg total) by mouth daily.    Dispense:  90 tablet    Refill:  1   I spent 45 minutes in preparing to see the patient by review of recent labs and images, obtaining and reviewing separately obtained history, communicating with the patient, ordering medications, an EKG and MPI, and documenting clinical information in the EHR including the differential Dx, treatment, and any further evaluation and management of multiple complex medical issues.     Follow-up: Return in about 6 weeks (around 06/26/2022).  Scarlette Calico, MD

## 2022-05-15 NOTE — Patient Instructions (Signed)
Hypertension, Adult High blood pressure (hypertension) is when the force of blood pumping through the arteries is too strong. The arteries are the blood vessels that carry blood from the heart throughout the body. Hypertension forces the heart to work harder to pump blood and may cause arteries to become narrow or stiff. Untreated or uncontrolled hypertension can lead to a heart attack, heart failure, a stroke, kidney disease, and other problems. A blood pressure reading consists of a higher number over a lower number. Ideally, your blood pressure should be below 120/80. The first ("top") number is called the systolic pressure. It is a measure of the pressure in your arteries as your heart beats. The second ("bottom") number is called the diastolic pressure. It is a measure of the pressure in your arteries as the heart relaxes. What are the causes? The exact cause of this condition is not known. There are some conditions that result in high blood pressure. What increases the risk? Certain factors may make you more likely to develop high blood pressure. Some of these risk factors are under your control, including: Smoking. Not getting enough exercise or physical activity. Being overweight. Having too much fat, sugar, calories, or salt (sodium) in your diet. Drinking too much alcohol. Other risk factors include: Having a personal history of heart disease, diabetes, high cholesterol, or kidney disease. Stress. Having a family history of high blood pressure and high cholesterol. Having obstructive sleep apnea. Age. The risk increases with age. What are the signs or symptoms? High blood pressure may not cause symptoms. Very high blood pressure (hypertensive crisis) may cause: Headache. Fast or irregular heartbeats (palpitations). Shortness of breath. Nosebleed. Nausea and vomiting. Vision changes. Severe chest pain, dizziness, and seizures. How is this diagnosed? This condition is diagnosed by  measuring your blood pressure while you are seated, with your arm resting on a flat surface, your legs uncrossed, and your feet flat on the floor. The cuff of the blood pressure monitor will be placed directly against the skin of your upper arm at the level of your heart. Blood pressure should be measured at least twice using the same arm. Certain conditions can cause a difference in blood pressure between your right and left arms. If you have a high blood pressure reading during one visit or you have normal blood pressure with other risk factors, you may be asked to: Return on a different day to have your blood pressure checked again. Monitor your blood pressure at home for 1 week or longer. If you are diagnosed with hypertension, you may have other blood or imaging tests to help your health care provider understand your overall risk for other conditions. How is this treated? This condition is treated by making healthy lifestyle changes, such as eating healthy foods, exercising more, and reducing your alcohol intake. You may be referred for counseling on a healthy diet and physical activity. Your health care provider may prescribe medicine if lifestyle changes are not enough to get your blood pressure under control and if: Your systolic blood pressure is above 130. Your diastolic blood pressure is above 80. Your personal target blood pressure may vary depending on your medical conditions, your age, and other factors. Follow these instructions at home: Eating and drinking  Eat a diet that is high in fiber and potassium, and low in sodium, added sugar, and fat. An example of this eating plan is called the DASH diet. DASH stands for Dietary Approaches to Stop Hypertension. To eat this way: Eat   plenty of fresh fruits and vegetables. Try to fill one half of your plate at each meal with fruits and vegetables. Eat whole grains, such as whole-wheat pasta, brown rice, or whole-grain bread. Fill about one  fourth of your plate with whole grains. Eat or drink low-fat dairy products, such as skim milk or low-fat yogurt. Avoid fatty cuts of meat, processed or cured meats, and poultry with skin. Fill about one fourth of your plate with lean proteins, such as fish, chicken without skin, beans, eggs, or tofu. Avoid pre-made and processed foods. These tend to be higher in sodium, added sugar, and fat. Reduce your daily sodium intake. Many people with hypertension should eat less than 1,500 mg of sodium a day. Do not drink alcohol if: Your health care provider tells you not to drink. You are pregnant, may be pregnant, or are planning to become pregnant. If you drink alcohol: Limit how much you have to: 0-1 drink a day for women. 0-2 drinks a day for men. Know how much alcohol is in your drink. In the [place] one drink equals one 12 oz bottle of beer (355 mL), one 5 oz glass of wine (148 mL), or one 1 oz glass of hard liquor (44 mL). Lifestyle  Work with your health care provider to maintain a healthy body weight or to lose weight. Ask what an ideal weight is for you. Get at least 30 minutes of exercise that causes your heart to beat faster (aerobic exercise) most days of the week. Activities may include walking, swimming, or biking. Include exercise to strengthen your muscles (resistance exercise), such as Pilates or lifting weights, as part of your weekly exercise routine. Try to do these types of exercises for 30 minutes at least 3 days a week. Do not use any products that contain nicotine or tobacco. These products include cigarettes, chewing tobacco, and vaping devices, such as e-cigarettes. If you need help quitting, ask your health care provider. Monitor your blood pressure at home as told by your health care provider. Keep all follow-up visits. This is important. Medicines Take over-the-counter and prescription medicines only as told by your health care provider. Follow directions carefully. Blood  pressure medicines must be taken as prescribed. Do not skip doses of blood pressure medicine. Doing this puts you at risk for problems and can make the medicine less effective. Ask your health care provider about side effects or reactions to medicines that you should watch for. Contact a health care provider if you: Think you are having a reaction to a medicine you are taking. Have headaches that keep coming back (recurring). Feel dizzy. Have swelling in your ankles. Have trouble with your vision. Get help right away if you: Develop a severe headache or confusion. Have unusual weakness or numbness. Feel faint. Have severe pain in your chest or abdomen. Vomit repeatedly. Have trouble breathing. These symptoms may be an emergency. Get help right away. Call 911. Do not wait to see if the symptoms will go away. Do not drive yourself to the hospital. Summary Hypertension is when the force of blood pumping through your arteries is too strong. If this condition is not controlled, it may put you at risk for serious complications. Your personal target blood pressure may vary depending on your medical conditions, your age, and other factors. For most people, a normal blood pressure is less than 120/80. Hypertension is treated with lifestyle changes, medicines, or a combination of both. Lifestyle changes include losing weight, eating a healthy,   low-sodium diet, exercising more, and limiting alcohol. This information is not intended to replace advice given to you by your health care provider. Make sure you discuss any questions you have with your health care provider. Document Revised: 09/03/2021 Document Reviewed: 09/03/2021 Elsevier Patient Education  2023 Elsevier Inc.  

## 2022-05-16 LAB — URINALYSIS, ROUTINE W REFLEX MICROSCOPIC
Bilirubin Urine: NEGATIVE
Hgb urine dipstick: NEGATIVE
Ketones, ur: NEGATIVE
Leukocytes,Ua: NEGATIVE
Nitrite: NEGATIVE
RBC / HPF: NONE SEEN (ref 0–?)
Specific Gravity, Urine: <=1.005 — AB (ref 1.000–1.030)
Total Protein, Urine: NEGATIVE
Urine Glucose: NEGATIVE
Urobilinogen, UA: 0.2 (ref 0.0–1.0)
pH: 7 (ref 5.0–8.0)

## 2022-05-16 LAB — HEMOGLOBIN A1C: Hgb A1c MFr Bld: 5.8 % (ref 4.6–6.5)

## 2022-05-16 MED ORDER — ROSUVASTATIN CALCIUM 10 MG PO TABS: 10.0000 mg | ORAL_TABLET | Freq: Every day | ORAL | 1 refills | Status: DC

## 2022-05-16 MED ORDER — OLMESARTAN MEDOXOMIL 20 MG PO TABS: 20.0000 mg | ORAL_TABLET | Freq: Every day | ORAL | 0 refills | Status: DC

## 2022-05-23 LAB — ANTI-NUCLEAR AB-TITER (ANA TITER)
ANA TITER: 1:80 {titer} — ABNORMAL HIGH
ANA Titer 1: 1:40 {titer} — ABNORMAL HIGH

## 2022-05-23 LAB — ALDOSTERONE + RENIN ACTIVITY W/ RATIO
ALDO / PRA Ratio: 3.7 Ratio (ref 0.9–28.9)
Aldosterone: 2 ng/dL
Renin Activity: 0.54 ng/mL/h (ref 0.25–5.82)

## 2022-05-23 LAB — RHEUMATOID FACTOR: Rheumatoid fact SerPl-aCnc: <14 IU/mL (ref <14)

## 2022-05-23 LAB — ANA: Anti Nuclear Antibody (ANA): POSITIVE — AB

## 2022-05-23 LAB — CYCLIC CITRUL PEPTIDE ANTIBODY, IGG: Cyclic Citrullin Peptide Ab: <16 UNITS

## 2022-05-23 LAB — VITAMIN B1: Vitamin B1 (Thiamine): 24 nmol/L (ref 8–30)

## 2022-05-23 LAB — ZINC: Zinc: 70 ug/dL (ref 60–130)

## 2022-05-24 ENCOUNTER — Other Ambulatory Visit: Payer: Self-pay | Admitting: Internal Medicine

## 2022-05-24 DIAGNOSIS — R768 Other specified abnormal immunological findings in serum: Secondary | ICD-10-CM

## 2022-05-26 ENCOUNTER — Telehealth: Payer: Self-pay | Admitting: Internal Medicine

## 2022-05-26 ENCOUNTER — Other Ambulatory Visit: Payer: Self-pay | Admitting: Internal Medicine

## 2022-05-26 DIAGNOSIS — F5101 Primary insomnia: Secondary | ICD-10-CM

## 2022-05-26 MED ORDER — ZOLPIDEM TARTRATE ER 12.5 MG PO TBCR: 12.5000 mg | EXTENDED_RELEASE_TABLET | Freq: Every evening | ORAL | 1 refills | Status: DC | PRN

## 2022-05-26 NOTE — Telephone Encounter (Signed)
Pt is requesting sleepaide prefably Ambien  Please Advise

## 2022-06-05 ENCOUNTER — Telehealth: Payer: Self-pay

## 2022-06-05 NOTE — Telephone Encounter (Signed)
Referred to a rheumatologist however avail apptmnt is 12/11/2022 can not wait to see them that far out. Pts needs to be seen sooner by any other rheumatologist because his hands has gotten worse.  Pt is also stating he was told by GI he was not due to 2025 for a colonoscopy.  ** Please contact with updates at [phone] or you can reach out through [place].

## 2022-06-06 ENCOUNTER — Telehealth: Payer: Self-pay

## 2022-06-06 NOTE — Patient Outreach (Signed)
  Care Coordination   Initial Visit Note   06/06/2022 Name: [name] [name] MRN: [id number] DOB: February 27, 1956  [name] [name] is a 66 y.o. year old male who sees Janith Lima, MD for primary care. I spoke with  [name] [name] by phone today  What matters to the patients health and wellness today?  Patient reports recently referred to rheumatology and appointment scheduled for 12/2022. States he has already contact PCP office to either get an appointment sooner or referral to another rheumatologist. No other questions or concerns or resource needed.   Goals Addressed   None     SDOH assessments and interventions completed:   Yes SDOH Interventions Today    Flowsheet Row Most Recent Value  SDOH Interventions   Food Insecurity Interventions Intervention Not Indicated  Transportation Interventions Intervention Not Indicated       Care Coordination Interventions Activated:  Yes Care Coordination Interventions:  No, not indicated    Follow up plan: No further intervention required.  Encounter Outcome:  Pt. Visit Completed  Thea Silversmith, RN, MSN, BSN, CCM Care Management Coordinator [phone]

## 2022-06-09 ENCOUNTER — Telehealth (HOSPITAL_COMMUNITY): Payer: Self-pay | Admitting: Internal Medicine

## 2022-06-09 NOTE — Telephone Encounter (Signed)
We have attempted to contact the ordering providers office to obtain a prior authorization for the ordered test.  However, we have been unsuccesful. Order will be removed from the active order WQ. Once the prior authorization is obtained we will reinstate the order and schedule patient.   06/09/22 Order cancelled due to no response from ordering MD for PA#  06/02/22 Inbasket sent x 3 for PA#  05/26/22 Inbaskets ent x 2 for PA#  05/19/22 Inbasket sent for PA#    Thank you

## 2022-06-19 ENCOUNTER — Encounter (HOSPITAL_COMMUNITY): Payer: Self-pay | Admitting: Internal Medicine

## 2022-06-25 ENCOUNTER — Telehealth (HOSPITAL_COMMUNITY): Payer: Self-pay | Admitting: *Deleted

## 2022-06-25 ENCOUNTER — Other Ambulatory Visit: Payer: Self-pay | Admitting: Internal Medicine

## 2022-06-25 DIAGNOSIS — M5136 Other intervertebral disc degeneration, lumbar region: Secondary | ICD-10-CM

## 2022-06-25 NOTE — Telephone Encounter (Signed)
Close encounter 

## 2022-06-26 ENCOUNTER — Ambulatory Visit (HOSPITAL_COMMUNITY)
Admission: RE | Admit: 2022-06-26 | Discharge: 2022-06-26 | Disposition: A | Discharge status: Home / Self Care | Payer: PPO | Source: Ambulatory Visit | Attending: Cardiology | Admitting: Cardiology

## 2022-06-26 ENCOUNTER — Encounter: Payer: Self-pay | Admitting: Internal Medicine

## 2022-06-26 ENCOUNTER — Other Ambulatory Visit: Payer: Self-pay | Admitting: Internal Medicine

## 2022-06-26 DIAGNOSIS — I4510 Unspecified right bundle-branch block: Secondary | ICD-10-CM | POA: Diagnosis not present

## 2022-06-26 DIAGNOSIS — R9439 Abnormal result of other cardiovascular function study: Secondary | ICD-10-CM | POA: Insufficient documentation

## 2022-06-26 DIAGNOSIS — R9431 Abnormal electrocardiogram [ECG] [EKG]: Secondary | ICD-10-CM

## 2022-06-26 LAB — MYOCARDIAL PERFUSION IMAGING
LV dias vol: 172 mL (ref 62–150)
LV sys vol: 108 mL
Nuc Stress EF: 37 %
Peak HR: 70 {beats}/min
Rest HR: 59 {beats}/min
Rest Nuclear Isotope Dose: 11 mCi
SDS: 0
SRS: 4
SSS: 4
ST Depression (mm): 0 mm
Stress Nuclear Isotope Dose: 31.7 mCi
TID: 1.04

## 2022-06-26 MED ORDER — REGADENOSON 0.4 MG/5ML IV SOLN
0.4000 mg | Freq: Once | INTRAVENOUS | Status: AC
  Administered 2022-06-26: 0.4 mg via INTRAVENOUS

## 2022-06-26 MED ORDER — TECHNETIUM TC 99M TETROFOSMIN IV KIT
11.0000 | PACK | Freq: Once | INTRAVENOUS | Status: AC | PRN
  Administered 2022-06-26: 11 via INTRAVENOUS

## 2022-06-26 MED ORDER — TECHNETIUM TC 99M TETROFOSMIN IV KIT
31.7000 | PACK | Freq: Once | INTRAVENOUS | Status: AC | PRN
  Administered 2022-06-26: 31.7 via INTRAVENOUS

## 2022-06-27 NOTE — Telephone Encounter (Signed)
Patient states he is very concern about his test results. Patient decline any shortness of breath or chest pain. He is concern what is going on . Patient is wondering if this cardiology referral to going to be sent to his previous cardiovascular specialist. He wants to know why it is urgent . Patient requesting a call back or message

## 2022-07-03 ENCOUNTER — Encounter: Payer: Self-pay | Admitting: Internal Medicine

## 2022-07-03 ENCOUNTER — Ambulatory Visit: Payer: PPO | Admitting: Internal Medicine

## 2022-07-03 VITALS — BP 134/80 | HR 66 | Ht 74.0 in | Wt 231.8 lb

## 2022-07-03 DIAGNOSIS — R9439 Abnormal result of other cardiovascular function study: Secondary | ICD-10-CM | POA: Diagnosis not present

## 2022-07-03 MED ORDER — ASPIRIN 81 MG PO TBEC: 81.0000 mg | DELAYED_RELEASE_TABLET | Freq: Every day | ORAL | 3 refills | Status: AC

## 2022-07-03 NOTE — Progress Notes (Signed)
Cardiology Office Note:    Date:  07/03/2022   ID:  [name] [name], DOB 10/11/1956, MRN [id number]  PCP:  [name] Lima, MD   [facility] Providers Cardiologist:  [name] Mayo, MD     Referring MD: [name] Lima, MD   No chief complaint on file. Positive Lexiscan  History of Present Illness:    [name] [name] is a 66 y.o. male with a hx of GERD, HTN, DM2 - A1c is well controlled, prior lap band sx, referral for abnormal SPECT  Patient saw his PCP [name] [name] in July. His blood pressure was poorly controlled 166/100 mmHg. EKG showed sinus bradycardia HR 59 bpm, septal Q and PACs. He obtained a troponin which was normal. BNP was normal. A lexiscan SPECT was obtained showing possible small apical scar v. Apical thinning. With apical WMA this was felt to be related to scar.  He has no hx of known CAD. He was admitted December 2021 with PNA c/b L empyema. He underwent VATS and drainage of empyema and decortication of the L lung by Dr. Prescott [name].  He denies CP or SOB. He works 65-85 hrs a week. 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    Past Surgical History:  Procedure Laterality Date   Arthroscopy X 1 left ([name] [name])     X 2 right   COLONOSCOPY     correction of deviated septum     DECORTICATION Left 10/26/2020   Procedure: DECORTICATION OF LEFT LUNG, APPLICATION OF ON Q-PUMP FOR PAIN MANAGEMENT;  Surgeon: [name] Poot, MD;  Location: [facility];  Service: Thoracic;  Laterality: Left;   GANGLION CYST EXCISION     right wrist ([name] [name])   GASTRIC BANDING PORT REVISION   8/11   PILONIDAL CYST / SINUS EXCISION  66 yrs old   [name] [name] Right 09/21/2013   Procedure: ULNAR COLLATERAL LIGAMENT REPAIR METACARPAL PHALANGE RIGHT THUMB POSSIBLE APL GRAFT RECONSTRUCTION;  Surgeon: [name] Sours, MD;  Location: [facility];  Service: Orthopedics;  Laterality: Right;   VIDEO ASSISTED THORACOSCOPY (VATS)/EMPYEMA Left 10/26/2020   Procedure: VIDEO ASSISTED THORACOSCOPY (VATS)/EMPYEMA ,INSERTION OF LEFT PLEURAL CHEST TUBES X 3;  Surgeon: [name] [name], [name] Salina, MD;  Location: [facility];  Service: Thoracic;  Laterality: Left;    Current Medications: Current Meds  Medication Sig   acetaminophen (TYLENOL) 650 MG CR tablet Take 650 mg by mouth every 8 (eight) hours as needed for pain.   aspirin EC 81 MG tablet Take 1 tablet (81 mg total) by mouth daily. Swallow whole.   cholecalciferol (VITAMIN D3) 25 MCG (1000 UNIT) tablet Take 1,000 Units by mouth daily.   Multiple Vitamin (MULTIVITAMIN) tablet Take 1 tablet by mouth daily.   olmesartan (BENICAR) 20 MG tablet Take 1 tablet (20 mg total) by mouth daily.   pantoprazole (PROTONIX) 40 MG tablet TAKE 1 TABLET BY MOUTH EVERY DAY   rosuvastatin (CRESTOR) 10 MG tablet Take 1  tablet (10 mg total) by mouth daily.   tadalafil (CIALIS) 20 MG tablet Take 1 tablet (20 mg total) by mouth daily as needed for erectile dysfunction.   traMADol (ULTRAM) 50 MG tablet TAKE 1 TABLET BY MOUTH EVERY 8 HOURS AS NEEDED.   Turmeric 450 MG CAPS Take 1 capsule by mouth 2 (two) times daily with a meal.   zinc gluconate ([employer] ZINC GLUCONATE) 50 MG tablet TAKE 1 TABLET (50 MG TOTAL) BY MOUTH EVERY OTHER DAY.   zolpidem (AMBIEN CR) 12.5 MG CR tablet Take 1 tablet (12.5 mg total) by mouth at bedtime as needed for sleep.     Allergies:   Penicillins and Codeine   Social History   Socioeconomic History   Marital status: Married    Spouse name: Not on file   Number of children: 2   Years of education: 12    Highest education level: Not on file  Occupational History   Occupation: Trucking     Employer: [employer]  Tobacco Use   Smoking status: Former    Types: Cigarettes    Quit date: 06/13/1995    Years since quitting: 27.0    Passive exposure: Past   Smokeless tobacco: Never  Vaping Use   Vaping Use: Never used  Substance and Sexual Activity   Alcohol use: No    Alcohol/week: 0.0 standard drinks of alcohol   Drug use: No   Sexual activity: Yes    Partners: Female  Other Topics Concern   Not on file  Social History Narrative   HSG, [name] - [employer]. Married '76  2 sons - '77, '80. Marriage in good health. [place], plus a lot business. Very active and feeling.    Social Determinants of Health   Financial Resource Strain: Low Risk  (03/24/2022)   Overall Financial Resource Strain (CARDIA)    Difficulty of Paying Living Expenses: Not hard at all  Food Insecurity: No Food Insecurity (06/06/2022)   Hunger Vital Sign    Worried About Running Out of Food in the Last Year: Never true    Ran Out of Food in the Last Year: Never true  Transportation Needs: No Transportation Needs (06/06/2022)   PRAPARE - Hydrologist (Medical): No    Lack of Transportation (Non-Medical): No  Physical Activity: Sufficiently Active (03/24/2022)   Exercise Vital Sign    Days of Exercise per Week: 3 days    Minutes of Exercise per Session: 60 min  Stress: No Stress Concern Present (03/24/2022)   [place]    Feeling of Stress : Not at all  Social Connections: Unknown (03/24/2022)   Social Connection and Isolation Panel [NHANES]    Frequency of Communication with Friends and Family: More than three times a week    Frequency of Social Gatherings with Friends and Family: More than three times a week    Attends Religious Services: Patient refused    Marine scientist or Organizations: Yes     Attends Music therapist: Patient refused    Marital Status: Married     Family History: The patient's family history includes Cancer in his father and mother; Coronary artery disease in his father; Heart attack in his father; Heart disease in his father; Hypertension in his mother; Parkinsonism in his maternal grandfather. There is no history of Diabetes, Colon cancer, Colon polyps, Esophageal cancer, Rectal cancer, or Stomach cancer. Father had  significant MI in his 65s. Uncle passed of MI 75s  ROS:   Please see the history of present illness.     All other systems reviewed and are negative.  EKGs/Labs/Other Studies Reviewed:    The following studies were reviewed today:   EKG:  EKG is  ordered today.  The ekg ordered today demonstrates   07/03/2022- NSR with artifact  Recent Labs: 05/15/2022: BUN 24; Creatinine, Ser 0.89; Hemoglobin 13.0; Platelets 159.0; Potassium 4.5; Pro B Natriuretic peptide (BNP) 85.0; Sodium 139; TSH 2.13   Recent Lipid Panel    Component Value Date/Time   CHOL 175 05/15/2022 1557   TRIG 152.0 (H) 05/15/2022 1557   TRIG 449 (HH) 11/18/2006 1011   HDL 47.60 05/15/2022 1557   CHOLHDL 4 05/15/2022 1557   VLDL 30.4 05/15/2022 1557   LDLCALC 97 05/15/2022 1557   LDLDIRECT 85.0 06/13/2021 1635     Risk Assessment/Calculations:     Physical Exam:    VS:   Vitals:   07/03/22 1015  BP: 134/80  Pulse: 66  SpO2: 99%     Wt Readings from Last 3 Encounters:  07/03/22 231 lb 12.8 oz (105.1 kg)  06/26/22 226 lb (102.5 kg)  05/15/22 226 lb (102.5 kg)     GEN:  Well nourished, well developed in no acute distress HEENT: Normal NECK: No JVD; No carotid bruits LYMPHATICS: No lymphadenopathy CARDIAC: RRR, no murmurs, rubs, gallops RESPIRATORY:  Clear to auscultation without rales, wheezing or rhonchi  ABDOMEN: Soft, non-tender, non-distended MUSCULOSKELETAL:  No edema; No deformity  SKIN: Warm and dry NEUROLOGIC:  Alert and oriented x  3 PSYCHIATRIC:  Normal affect   ASSESSMENT:   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 He is currently asymptomatic and very active.  Will recommend ASA and LDL < 70 mg/dL.  Just started crestor. A1c 5.8. Goal <7.  Discussed the importance of the Mediterranean diet.  PLAN:    In order of problems listed above:  TTE Recheck 3 months fasting lipids  Start Asa 81 mg daily  Follow up 6 months       Medication Adjustments/Labs and Tests Ordered: Current medicines are reviewed at length with the patient today.  Concerns regarding medicines are outlined above.  Orders Placed This Encounter  Procedures   Lipid panel   EKG 12-Lead   ECHOCARDIOGRAM COMPLETE   Meds ordered this encounter  Medications   aspirin EC 81 MG tablet    Sig: Take 1 tablet (81 mg total) by mouth daily. Swallow whole.    Dispense:  90 tablet    Refill:  3    Patient Instructions  Medication Instructions:  START aspirin 81 MG Daily. *If you need a refill on your cardiac medications before your next appointment, please call your pharmacy*  Lab Work: Your physician recommends that you return for fasting lab work in: 3 Months - November 2023   If you have labs (blood work) drawn today and your tests are completely normal, you will receive your results only by: [employer] (if you have MyChart) OR A paper copy in the mail If you have any lab test that is abnormal or we need to change your treatment, we will call you to review the results.   Testing/Procedures: Your physician has requested that you have an echocardiogram. Echocardiography is a painless test that uses sound waves to create images of your heart. It provides your doctor with information about the size and shape  of your heart and how well your heart's chambers and valves are working. This procedure takes approximately one hour. There are no restrictions for this procedure.    Follow-Up: At [facility], you and your health needs are our priority.  As part of our continuing mission to provide you with exceptional heart care, we have created designated Provider Care Teams.  These Care Teams include your primary Cardiologist (physician) and Advanced Practice Providers (APPs -  Physician Assistants and Nurse Practitioners) who all work together to provide you with the care you need, when you need it.  We recommend signing up for the patient portal called "MyChart".  Sign up information is provided on this After Visit Summary.  MyChart is used to connect with patients for Virtual Visits (Telemedicine).  Patients are able to view lab/test results, encounter notes, upcoming appointments, etc.  Non-urgent messages can be sent to your provider as well.   To learn more about what you can do with MyChart, go to [email or web address].    Your next appointment:   6 month(s)  The format for your next appointment:   In Person  Provider:   Janina Mayo, MD {     Signed, [name] Mayo, MD  07/03/2022 10:47 AM    [place]

## 2022-07-03 NOTE — Patient Instructions (Signed)
Medication Instructions:  START aspirin 81 MG Daily. *If you need a refill on your cardiac medications before your next appointment, please call your pharmacy*  Lab Work: Your physician recommends that you return for fasting lab work in: 3 Months - November 2023   If you have labs (blood work) drawn today and your tests are completely normal, you will receive your results only by: [employer] (if you have MyChart) OR A paper copy in the mail If you have any lab test that is abnormal or we need to change your treatment, we will call you to review the results.   Testing/Procedures: Your physician has requested that you have an echocardiogram. Echocardiography is a painless test that uses sound waves to create images of your heart. It provides your doctor with information about the size and shape of your heart and how well your heart's chambers and valves are working. This procedure takes approximately one hour. There are no restrictions for this procedure.    Follow-Up: At [facility], you and your health needs are our priority.  As part of our continuing mission to provide you with exceptional heart care, we have created designated Provider Care Teams.  These Care Teams include your primary Cardiologist (physician) and Advanced Practice Providers (APPs -  Physician Assistants and Nurse Practitioners) who all work together to provide you with the care you need, when you need it.  We recommend signing up for the patient portal called "MyChart".  Sign up information is provided on this After Visit Summary.  MyChart is used to connect with patients for Virtual Visits (Telemedicine).  Patients are able to view lab/test results, encounter notes, upcoming appointments, etc.  Non-urgent messages can be sent to your provider as well.   To learn more about what you can do with MyChart, go to [email or web address].    Your next appointment:   6 month(s)  The format for your next appointment:    In Person  Provider:   Janina Mayo, MD {

## 2022-07-04 ENCOUNTER — Ambulatory Visit (HOSPITAL_BASED_OUTPATIENT_CLINIC_OR_DEPARTMENT_OTHER)
Admission: RE | Admit: 2022-07-04 | Discharge: 2022-07-04 | Disposition: A | Discharge status: Home / Self Care | Payer: PPO | Source: Ambulatory Visit | Attending: Internal Medicine | Admitting: Internal Medicine

## 2022-07-04 DIAGNOSIS — I252 Old myocardial infarction: Secondary | ICD-10-CM | POA: Diagnosis not present

## 2022-07-04 DIAGNOSIS — R9439 Abnormal result of other cardiovascular function study: Secondary | ICD-10-CM | POA: Insufficient documentation

## 2022-07-04 DIAGNOSIS — I2589 Other forms of chronic ischemic heart disease: Secondary | ICD-10-CM

## 2022-07-04 DIAGNOSIS — I340 Nonrheumatic mitral (valve) insufficiency: Secondary | ICD-10-CM | POA: Insufficient documentation

## 2022-07-04 NOTE — Progress Notes (Signed)
  Echocardiogram 2D Echocardiogram has been performed.  [name] [name] F 07/04/2022, 8:57 AM

## 2022-07-08 LAB — ECHOCARDIOGRAM COMPLETE
AR max vel: 1.83 cm2
AV Area VTI: 1.87 cm2
AV Area mean vel: 1.91 cm2
AV Mean grad: 6 mmHg
AV Peak grad: 11.2 mmHg
Ao pk vel: 1.67 m/s
Area-P 1/2: 3.48 cm2
S' Lateral: 3.8 cm

## 2022-08-02 ENCOUNTER — Other Ambulatory Visit: Payer: Self-pay | Admitting: Internal Medicine

## 2022-08-02 DIAGNOSIS — M19041 Primary osteoarthritis, right hand: Secondary | ICD-10-CM

## 2022-08-02 DIAGNOSIS — M170 Bilateral primary osteoarthritis of knee: Secondary | ICD-10-CM

## 2022-08-02 DIAGNOSIS — M51369 Other intervertebral disc degeneration, lumbar region without mention of lumbar back pain or lower extremity pain: Secondary | ICD-10-CM

## 2022-08-02 DIAGNOSIS — I10 Essential (primary) hypertension: Secondary | ICD-10-CM

## 2022-08-02 DIAGNOSIS — M5136 Other intervertebral disc degeneration, lumbar region: Secondary | ICD-10-CM

## 2022-08-12 DIAGNOSIS — E663 Overweight: Secondary | ICD-10-CM | POA: Diagnosis not present

## 2022-08-12 DIAGNOSIS — R768 Other specified abnormal immunological findings in serum: Secondary | ICD-10-CM | POA: Diagnosis not present

## 2022-08-12 DIAGNOSIS — M2560 Stiffness of unspecified joint, not elsewhere classified: Secondary | ICD-10-CM | POA: Diagnosis not present

## 2022-08-12 DIAGNOSIS — M79642 Pain in left hand: Secondary | ICD-10-CM | POA: Diagnosis not present

## 2022-08-12 DIAGNOSIS — M79641 Pain in right hand: Secondary | ICD-10-CM | POA: Diagnosis not present

## 2022-08-12 DIAGNOSIS — M6530 Trigger finger, unspecified finger: Secondary | ICD-10-CM | POA: Diagnosis not present

## 2022-08-12 DIAGNOSIS — Z6829 Body mass index (BMI) 29.0-29.9, adult: Secondary | ICD-10-CM | POA: Diagnosis not present

## 2022-09-25 ENCOUNTER — Other Ambulatory Visit: Payer: Self-pay | Admitting: Internal Medicine

## 2022-09-25 DIAGNOSIS — I10 Essential (primary) hypertension: Secondary | ICD-10-CM

## 2022-09-25 DIAGNOSIS — E785 Hyperlipidemia, unspecified: Secondary | ICD-10-CM

## 2022-10-23 ENCOUNTER — Other Ambulatory Visit: Payer: Self-pay | Admitting: Internal Medicine

## 2022-10-23 ENCOUNTER — Telehealth (INDEPENDENT_AMBULATORY_CARE_PROVIDER_SITE_OTHER): Payer: PPO | Admitting: Family Medicine

## 2022-10-23 DIAGNOSIS — U071 COVID-19: Secondary | ICD-10-CM | POA: Diagnosis not present

## 2022-10-23 MED ORDER — MOLNUPIRAVIR EUA 200MG CAPSULE: 4.0000 | ORAL_CAPSULE | Freq: Two times a day (BID) | ORAL | 0 refills | Status: AC

## 2022-10-23 NOTE — Patient Instructions (Addendum)
---------------------------------------------------------------------------------------------------------------------------    WORK SLIP:  Patient [name] [name],  1956/09/04, was seen for a medical visit today, 10/23/22 . Please excuse from work for a COVID illness. Patient will likely be contagious for an average of 7-10 days from the onset of symptoms, PLUS 1 day of no fever and improved symptoms.  Will defer to employer for a sooner return to work if patient has 2 negative covid tests 48 hours apart and is feeling better, OR if symptoms have resolved or improved, it is greater than 5 days since the onset, and the patient can wear a high-quality, tight fitting mask such as N95 or KN95 at all times for an additional 5 days until day 10.  Sincerely: E-signature: Dr. Colin Benton, DO [facility] Ph: [phone]   ------------------------------------------------------------------------------------------------------------------------------     HOME CARE TIPS:   -I sent the medication(s) we discussed to your pharmacy: Meds ordered this encounter  Medications   molnupiravir EUA (LAGEVRIO) 200 mg CAPS capsule    Sig: Take 4 capsules (800 mg total) by mouth 2 (two) times daily for 5 days.    Dispense:  40 capsule    Refill:  0     -I sent in the [place] treatment or referral you requested per our discussion. Please see the information provided below and discuss further with the pharmacist/treatment team.   -there is a chance of rebound illness with covid after improving. This can happen whether or not you take an antiviral treatment. If you become sick again with covid after getting better, please schedule a follow up virtual visit and isolate again.  -can use tylenol if needed for fevers, aches and pains per instructions  -nasal saline sinus rinses twice daily  -stay hydrated, drink plenty of fluids and eat small healthy meals - avoid dairy  -follow  up with your doctor in 2-3 days unless improving and feeling better  -stay home while sick, except to seek medical care. If you have COVID19, you will likely be contagious for 7-10 days. Wear a good mask that fits snugly (such as N95 or KN95) if around others to reduce the risk of transmission.  It was nice to meet you today, and I really hope you are feeling better soon. I help [facility] out with telemedicine visits on Tuesdays and Thursdays and am happy to help if you need a follow up virtual visit on those days. Otherwise, if you have any concerns or questions following this visit please schedule a follow up visit with your Primary Care doctor or seek care at a local urgent care clinic to avoid delays in care.    Seek in person care or schedule a follow up video visit promptly if your symptoms worsen, new concerns arise or you are not improving with treatment. Call 911 and/or seek emergency care if your symptoms are severe or life threatening.  PLEASE SEE THE FOLLOWING LINK FOR THE MOST UPDATED INFORMATION ABOUT LAGEVRIO:  [email or web address]      Fact Sheet for Patients And Caregivers Emergency Use Authorization (EUA) Of LAGEVRIOT (molnupiravir) capsules For Coronavirus Disease 2019 (COVID-19)  What is the most important information I should know about LAGEVRIO? LAGEVRIO may cause serious side effects, including: ? LAGEVRIO may cause harm to your unborn baby. It is not known if LAGEVRIO will harm your baby if you take LAGEVRIO during pregnancy. o LAGEVRIO is not recommended for use in pregnancy. o LAGEVRIO has not been studied in pregnancy. LAGEVRIO was studied  in pregnant animals only. When LAGEVRIO was given to pregnant animals, LAGEVRIO caused harm to their unborn babies. o You and your healthcare provider may decide that you should take LAGEVRIO during pregnancy if there are no other COVID-19 treatment options approved or authorized by the FDA that are accessible or  clinically appropriate for you. o If you and your healthcare provider decide that you should take LAGEVRIO during pregnancy, you and your healthcare provider should discuss the known and potential benefits and the potential risks of taking LAGEVRIO during pregnancy. For individuals who are able to become pregnant: ? You should use a reliable method of birth control (contraception) consistently and correctly during treatment with LAGEVRIO and for 4 days after the last dose of LAGEVRIO. Talk to your healthcare provider about reliable birth control methods. ? Before starting treatment with [facility] your healthcare provider may do a pregnancy test to see if you are pregnant before starting treatment with LAGEVRIO. ? Tell your healthcare provider right away if you become pregnant or think you may be pregnant during treatment with LAGEVRIO. Pregnancy Surveillance Program: ? There is a pregnancy surveillance program for individuals who take LAGEVRIO during pregnancy. The purpose of this program is to collect information about the health of you and your baby. Talk to your healthcare provider about how to take part in this program. ? If you take LAGEVRIO during pregnancy and you agree to participate in the pregnancy surveillance program and allow your healthcare provider to share your information with [place], then your healthcare provider will report your use of [place] during pregnancy to [place]. by calling [phone] or PeacefulBlog.es. For individuals who are sexually active with partners who are able to become pregnant: ? It is not known if LAGEVRIO can affect sperm. While the risk is regarded as low, animal studies to fully assess the potential for LAGEVRIO to affect the babies of males treated with LAGEVRIO have not been completed. A reliable method of birth control (contraception) should be used consistently and correctly during treatment with  LAGEVRIO and for at least 3 months after the last dose. The risk to sperm beyond 3 months is not known. Studies to understand the risk to sperm beyond 3 months are ongoing. Talk to your healthcare provider about reliable birth control methods. Talk to your healthcare provider if you have questions or concerns about how LAGEVRIO may affect sperm. You are being given this fact sheet because your healthcare provider believes it is necessary to provide you with LAGEVRIO for the treatment of adults with mild-to-moderate coronavirus disease 2019 (COVID-19) with positive results of direct SARS-CoV-2 viral testing, and who are at high risk for progression to severe COVID-19 including hospitalization or death, and for whom other COVID-19 treatment options approved or authorized by the FDA are not accessible or clinically appropriate. The U.S. Food and Drug Administration (FDA) has issued an Emergency Use Authorization (EUA) to make LAGEVRIO available during the COVID-19 pandemic (for more details about an EUA please see "What is an Emergency Use Authorization?" at the end of this document). LAGEVRIO is not an FDA-approved medicine in the [place]. Read this Fact Sheet for information about LAGEVRIO. Talk to your healthcare provider about your options if you have any questions. It is your choice to take LAGEVRIO.  What is COVID-19? COVID-19 is caused by a virus called a coronavirus. You can get COVID-19 through close contact with another person who has the virus. COVID-19 illnesses have ranged from  very mild-to-severe, including illness resulting in death. While information so far suggests that most COVID-19 illness is mild, serious illness can happen and may cause some of your other medical conditions to become worse. Older people and people of all ages with severe, long lasting (chronic) medical conditions like heart disease, lung disease and diabetes, for example seem to be at higher risk of  being hospitalized for COVID-19.  What is LAGEVRIO? LAGEVRIO is an investigational medicine used to treat mild-to-moderate COVID-19 in adults: ? with positive results of direct SARS-CoV-2 viral testing, and ? who are at high risk for progression to severe COVID-19 including hospitalization or death, and for whom other COVID-19 treatment options approved or authorized by the FDA are not accessible or clinically appropriate. The FDA has authorized the emergency use of LAGEVRIO for the treatment of mild-tomoderate COVID-19 in adults under an EUA. For more information on EUA, see the "What is an Emergency Use Authorization (EUA)?" section at the end of this Fact Sheet. LAGEVRIO is not authorized: ? for use in people less than 43 years of age. ? for prevention of COVID-19. ? for people needing hospitalization for COVID-19. ? for use for longer than 5 consecutive days.  What should I tell my healthcare provider before I take LAGEVRIO? Tell your healthcare provider if you: ? Have any allergies ? Are breastfeeding or plan to breastfeed ? Have any serious illnesses ? Are taking any medicines (prescription, over-the-counter, vitamins, or herbal products).  How do I take LAGEVRIO? ? Take LAGEVRIO exactly as your healthcare provider tells you to take it. ? Take 4 capsules of LAGEVRIO every 12 hours (for example, at 8 am and at 8 pm) ? Take LAGEVRIO for 5 days. It is important that you complete the full 5 days of treatment with LAGEVRIO. Do not stop taking LAGEVRIO before you complete the full 5 days of treatment, even if you feel better. ? Take LAGEVRIO with or without food. ? You should stay in isolation for as long as your healthcare provider tells you to. Talk to your healthcare provider if you are not sure about how to properly isolate while you have COVID-19. ? Swallow LAGEVRIO capsules whole. Do not open, break, or crush the capsules. If you cannot swallow capsules whole, tell your  healthcare provider. ? What to do if you miss a dose: o If it has been less than 10 hours since the missed dose, take it as soon as you remember o If it has been more than 10 hours since the missed dose, skip the missed dose and take your dose at the next scheduled time. ? Do not double the dose of LAGEVRIO to make up for a missed dose.  What are the important possible side effects of LAGEVRIO? ? See, "What is the most important information I should know about LAGEVRIO?" ? Allergic Reactions. Allergic reactions can happen in people taking LAGEVRIO, even after only 1 dose. Stop taking LAGEVRIO and call your healthcare provider right away if you get any of the following symptoms of an allergic reaction: o hives o rapid heartbeat o trouble swallowing or breathing o swelling of the mouth, lips, or face o throat tightness o hoarseness o skin rash The most common side effects of LAGEVRIO are: ? diarrhea ? nausea ? dizziness These are not all the possible side effects of LAGEVRIO. Not many people have taken LAGEVRIO. Serious and unexpected side effects may happen. This medicine is still being studied, so it is possible that all  of the risks are not known at this time.  What other treatment choices are there?  Veklury (remdesivir) is FDA-approved as an intravenous (IV) infusion for the treatment of mildto-moderate ZOXWR-60 in certain adults and children. Talk with your doctor to see if Marijean Heath is appropriate for you. Like LAGEVRIO, FDA may also allow for the emergency use of other medicines to treat people with COVID-19. Go to LacrosseProperties.si for more information. It is your choice to be treated or not to be treated with LAGEVRIO. Should you decide not to take it, it will not change your standard medical care.  What if I am breastfeeding? Breastfeeding is not recommended during  treatment with LAGEVRIO and for 4 days after the last dose of LAGEVRIO. If you are breastfeeding or plan to breastfeed, talk to your healthcare provider about your options and specific situation before taking LAGEVRIO.  How do I report side effects with LAGEVRIO? Contact your healthcare provider if you have any side effects that bother you or do not go away. Report side effects to FDA MedWatch at SmoothHits.hu or call 1-800-FDA-1088 (1- 480-171-7938).  How should I store [place]? ? Store LAGEVRIO capsules at room temperature between 64F to 41F (20C to 25C). ? Keep LAGEVRIO and all medicines out of the reach of children and pets. How can I learn more about COVID-19? ? Ask your healthcare provider. ? Visit SeekRooms.co.uk ? Contact your local or state public health department. ? Call [place] at [phone] (toll free in the [place] ? Visit [email or web address].com  What Is an Emergency Use Authorization (EUA)? The [place] FDA has made [place] available under an emergency access mechanism called an Emergency Use Authorization (EUA) The EUA is supported by a Presenter, broadcasting Health and Human Service (HHS) declaration that circumstances exist to justify emergency use of drugs and biological products during the COVID-19 pandemic. LAGEVRIO for the treatment of mild-to-moderate COVID-19 in adults with positive results of direct SARS-CoV-2 viral testing, who are at high risk for progression to severe COVID-19, including hospitalization or death, and for whom alternative COVID-19 treatment options approved or authorized by FDA are not accessible or clinically appropriate, has not undergone the same type of review as an FDA-approved product. In issuing an EUA under the NWGNF-62 public health emergency, the FDA has determined, among other things, that based on the total amount of scientific evidence available including data from adequate and well-controlled  clinical trials, if available, it is reasonable to believe that the product may be effective for diagnosing, treating, or preventing COVID-19, or a serious or life-threatening disease or condition caused by COVID-19; that the known and potential benefits of the product, when used to diagnose, treat, or prevent such disease or condition, outweigh the known and potential risks of such product; and that there are no adequate, approved, and available alternatives.  All of these criteria must be met to allow for the product to be used in the treatment of patients during the COVID-19 pandemic. The EUA for LAGEVRIO is in effect for the duration of the COVID-19 declaration justifying emergency use of LAGEVRIO, unless terminated or revoked (after which LAGEVRIO may no longer be used under the EUA). For patent information: http://rogers.info/ Copyright  2021-2022 [place], [place] and its affiliates. All rights reserved. [id number] Revised: March 2022

## 2022-10-23 NOTE — Progress Notes (Signed)
Virtual Visit via Video Note  I connected with [name] [name]  on 10/23/22 at  6:00 PM EST by a video enabled telemedicine application and verified that I am speaking with the correct person using two identifiers.  Location patient: [place] Location provider:work or home office Persons participating in the virtual visit: patient, provider  I discussed the limitations and requested verbal permission for telemedicine visit. The patient expressed understanding and agreed to proceed.   HPI:  Acute telemedicine visit for  Covid19: -Onset: 2-3 days ago, tested positive yesterday -Symptoms include: nasal congestion, cough, fatigue, headache, some body aches,  -Denies: CP, SOB, NVD, fevers -Has tried: tylenol -drinking fluids -Pertinent past medical history: see below -Pertinent medication allergies: Allergies  Allergen Reactions   Penicillins Anaphylaxis    Tolerated cefazolin/cefepime 10/2020 Did it involve swelling of the face/tongue/throat, SOB, or low BP? Y Did it involve sudden or severe rash/hives, skin peeling, or any reaction on the inside of your mouth or nose? Y Did you need to seek medical attention at a hospital or doctor's office? Y When did it last happen?   Childhood    If all above answers are "NO", may proceed with cephalosporin use.     Codeine Nausea And Vomiting  -COVID-19 vaccine status: has had 4 vaccines Immunization History  Administered Date(s) Administered   Fluad Quad(high Dose 65+) 07/24/2019, 08/02/2022   Hepatitis A, Adult 03/07/2020, 09/06/2020   Hepb-cpg 03/07/2020, 04/06/2020   Influenza Whole 08/06/2009, 09/02/2010, 09/08/2012   Influenza, High Dose Seasonal PF 07/17/2020   Influenza,inj,Quad PF,6+ Mos 08/06/2014, 07/31/2017, 09/22/2018, 08/11/2019   Influenza-Unspecified 06/29/2015, 07/17/2020, 08/12/2021   PFIZER(Purple Top)SARS-COV-2 Vaccination 01/19/2020, 02/13/2020, 08/14/2020, 04/06/2021   PNEUMOCOCCAL CONJUGATE-20 08/02/2022   Pfizer Covid-19 Vaccine  Bivalent Booster 70yr & up 08/12/2021   Pneumococcal Conjugate-13 11/13/2020   Td 09/02/2010   Tdap 03/07/2020   Zoster Recombinat (Shingrix) 12/09/2018, 07/24/2019   Zoster, Live 07/27/2015     ROS: See pertinent positives and negatives per HPI.  Past Medical History:  Diagnosis Date   Allergy    Diabetes mellitus    type 2-off all meds now after wt loss   [place]ema [place] 10/2020   GERD (gastroesophageal reflux disease)    Hyperlipidemia    Hyperlipidemia    Hypertension    Osteoarthritis    hands, mild, not disabling    Past Surgical History:  Procedure Laterality Date   Arthroscopy X 1 left ([name])     X 2 right   COLONOSCOPY     correction of deviated septum     DECORTICATION Left 10/26/2020   Procedure: DECORTICATION OF LEFT LUNG, APPLICATION OF ON Q-PUMP FOR PAIN MANAGEMENT;  Surgeon: VIvin Poot MD;  [facility]  Service: Thoracic;  Laterality: Left;   GANGLION CYST EXCISION     right wrist ([place])   GASTRIC BANDING PORT REVISION  8/11   PILONIDAL CYST / SINUS EXCISION  66yrs old   [place]   Procedure: ULNAR COLLATERAL LIGAMENT REPAIR METACARPAL PHALANGE RIGHT THUMB POSSIBLE APL GRAFT RECONSTRUCTION;  Surgeon: GWynonia Sours MD;  [facility]  Service: Orthopedics;  Laterality: Right;   VIDEO ASSISTED THORACOSCOPY (VATS)/EMPYEMA Left 10/26/2020   Procedure: VIDEO ASSISTED THORACOSCOPY (VATS)/EMPYEMA ,INSERTION OF LEFT PLEURAL CHEST TUBES X 3;  Surgeon: VIvin Poot MD;  [facility];  Service: Thoracic;  Laterality: Left;     Current Outpatient Medications:    molnupiravir EUA (LAGEVRIO)  200 mg CAPS capsule, Take 4 capsules (800 mg total) by mouth 2 (two) times daily for 5 days., Disp: 40 capsule, Rfl: 0   acetaminophen (TYLENOL) 650 MG CR tablet, Take 650 mg by mouth every 8 (eight) hours as needed for pain., Disp: , Rfl:    aspirin EC 81 MG tablet, Take 1  tablet (81 mg total) by mouth daily. Swallow whole., Disp: 90 tablet, Rfl: 3   cholecalciferol (VITAMIN D3) 25 MCG (1000 UNIT) tablet, Take 1,000 Units by mouth daily., Disp: , Rfl:    Multiple Vitamin (MULTIVITAMIN) tablet, Take 1 tablet by mouth daily., Disp: , Rfl:    olmesartan (BENICAR) 20 MG tablet, TAKE 1 TABLET BY MOUTH EVERY DAY, Disp: 90 tablet, Rfl: 0   pantoprazole (PROTONIX) 40 MG tablet, TAKE 1 TABLET BY MOUTH EVERY DAY, Disp: 90 tablet, Rfl: 1   rosuvastatin (CRESTOR) 10 MG tablet, TAKE 1 TABLET BY MOUTH EVERY DAY, Disp: 90 tablet, Rfl: 1   tadalafil (CIALIS) 20 MG tablet, Take 1 tablet (20 mg total) by mouth daily as needed for erectile dysfunction., Disp: 20 tablet, Rfl: 5   traMADol (ULTRAM) 50 MG tablet, TAKE 1 TABLET BY MOUTH EVERY 8 HOURS AS NEEDED, Disp: 90 tablet, Rfl: 3   Turmeric 450 MG CAPS, Take 1 capsule by mouth 2 (two) times daily with a meal., Disp: 180 capsule, Rfl: 3   zinc gluconate (CVS ZINC GLUCONATE) 50 MG tablet, TAKE 1 TABLET (50 MG TOTAL) BY MOUTH EVERY OTHER DAY., Disp: 45 tablet, Rfl: 1   zolpidem (AMBIEN CR) 12.5 MG CR tablet, Take 1 tablet (12.5 mg total) by mouth at bedtime as needed for sleep., Disp: 90 tablet, Rfl: 1  EXAM:  VITALS per patient if applicable:  GENERAL: alert, oriented, appears well and in no acute distress  HEENT: atraumatic, conjunttiva clear, no obvious abnormalities on inspection of external nose and ears  NECK: normal movements of the head and neck  LUNGS: on inspection no signs of respiratory distress, breathing rate appears normal, no obvious gross SOB, gasping or wheezing  CV: no obvious cyanosis  MS: moves all visible extremities without noticeable abnormality  PSYCH/NEURO: pleasant and cooperative, no obvious depression or anxiety, speech and thought processing grossly intact  ASSESSMENT AND PLAN:  Discussed the following assessment and plan:  COVID-19   Discussed treatment options, side effect and risk of  drug interactions, ideal treatment window, potential complications, isolation and precautions for COVID-19.  Discussed possibility of rebound with or without antivirals. Checked for/reviewed last GFR - listed in HPI if available. After lengthy discussion, the patient opted for treatment with Legevrio due to being higher risk for complications of covid or severe disease and other factors. He did not wish to adjust medications and was concerned about drug interactions with the Paxlovid. Discussed EUA status of this drug and the fact that there is preliminary limited knowledge of risks/interactions/side effects per EUA document vs possible benefits and precautions. This information was shared with patient during the visit and also was provided in patient instructions. Also, advised that patient discuss risks/interactions and use with pharmacist/treatment team as well.  ther symptomatic care measures summarized in patient instructions.  Work/School slipped offered: provided in patient instructions   Advised to seek prompt virtual visit or in person care if worsening, new symptoms arise, or if is not improving with treatment as expected per our conversation of expected course. Discussed options for follow up care. Did let this patient know that I do telemedicine on  Tuesdays and Thursdays for  and those are the days I am logged into the system. Advised to schedule follow up visit with PCP, [place] virtual visits or [facility] if any further questions or concerns to avoid delays in care.   I discussed the assessment and treatment plan with the patient. The patient was provided an opportunity to ask questions and all were answered. The patient agreed with the plan and demonstrated an understanding of the instructions.     Lucretia Kern, DO

## 2022-11-24 ENCOUNTER — Other Ambulatory Visit: Payer: Self-pay | Admitting: Internal Medicine

## 2022-11-24 DIAGNOSIS — F5101 Primary insomnia: Secondary | ICD-10-CM

## 2022-11-27 NOTE — Progress Notes (Deleted)
Office Visit Note  Patient: [name] [name]             Date of Birth: 11/07/1956           MRN: [id number]             PCP: Janith Lima, MD Referring: Janith Lima, MD Visit Date: 12/11/2022 Occupation: '@GUAROCC'$ @  Subjective:  No chief complaint on file.   History of Present Illness: [name] [name] is a 67 y.o. male ***     Activities of Daily Living:  Patient reports morning stiffness for *** {minute/hour:19697}.   Patient {ACTIONS;DENIES/REPORTS:21021675::"Denies"} nocturnal pain.  Difficulty dressing/grooming: {ACTIONS;DENIES/REPORTS:21021675::"Denies"} Difficulty climbing stairs: {ACTIONS;DENIES/REPORTS:21021675::"Denies"} Difficulty getting out of chair: {ACTIONS;DENIES/REPORTS:21021675::"Denies"} Difficulty using hands for taps, buttons, cutlery, and/or writing: {ACTIONS;DENIES/REPORTS:21021675::"Denies"}  No Rheumatology ROS completed.   PMFS History:  Patient Active Problem List   Diagnosis Date Noted   Abnormal thallium stress test 06/26/2022   Prediabetes 05/15/2022   Abnormal electrocardiogram (ECG) (EKG) 05/15/2022   New onset right bundle branch block (RBBB) 05/15/2022   DDD (degenerative disc disease), lumbar 06/13/2021   Benign prostatic hyperplasia without lower urinary tract symptoms 06/13/2021   Thoracic aortic aneurysm without rupture ([place]) 11/13/2020   Empyema ([place]) 10/25/2020   Zinc deficiency 03/01/2020   Iron deficiency anemia due to dietary causes 02/24/2020   Primary osteoarthritis of both hands 03/09/2019   Cannabis abuse 12/13/2018   COPD (chronic obstructive pulmonary disease) with acute bronchitis ([place]) 12/09/2018   Vitamin D deficiency 05/10/2014   Thiamine deficiency 05/10/2014   Routine general medical examination at a health care facility 05/05/2014   TBI (traumatic brain injury) ([facility]) 03/20/2014   Primary insomnia 07/04/2010   Hypothyroidism 04/05/2009   DJD (degenerative joint disease) of knee 05/30/2008   Hyperlipidemia  with target LDL less than 100 03/26/2007   OBESITY 03/26/2007   Essential hypertension 03/26/2007   GERD 03/26/2007    Past Medical History:  Diagnosis Date   Allergy    Diabetes mellitus    type 2-off all meds now after wt loss   Empyema ([place]) 10/2020   GERD (gastroesophageal reflux disease)    Hyperlipidemia    Hyperlipidemia    Hypertension    Osteoarthritis    hands, mild, not disabling    Family History  Problem Relation Age of Onset   Cancer Father        lung cancer metatstatic cancer to brain   Coronary artery disease Father    Heart attack Father        X 2 (41,45)   Heart disease Father        CAD/MI x 2   Cancer Mother        rapid on set   Hypertension Mother    Parkinsonism Maternal Grandfather    Diabetes Neg Hx    Colon cancer Neg Hx    Colon polyps Neg Hx    Esophageal cancer Neg Hx    Rectal cancer Neg Hx    Stomach cancer Neg Hx    Past Surgical History:  Procedure Laterality Date   Arthroscopy X 1 left ([name])     X 2 right   COLONOSCOPY     correction of deviated septum     DECORTICATION Left 10/26/2020   Procedure: DECORTICATION OF LEFT LUNG, APPLICATION OF ON Q-PUMP FOR PAIN MANAGEMENT;  Surgeon: Ivin Poot, MD;  Location: [facility];  Service: Thoracic;  Laterality: Left;   GANGLION CYST EXCISION     right  wrist (kuzma)   GASTRIC BANDING PORT REVISION  8/11   PILONIDAL CYST / SINUS EXCISION  67 yrs old   TONSILLECTOMY     ULNAR COLLATERAL LIGAMENT REPAIR Right 09/21/2013   Procedure: ULNAR COLLATERAL LIGAMENT REPAIR METACARPAL PHALANGE RIGHT THUMB POSSIBLE APL GRAFT RECONSTRUCTION;  Surgeon: Wynonia Sours, MD;  Location: [facility];  Service: Orthopedics;  Laterality: Right;   VIDEO ASSISTED THORACOSCOPY (VATS)/EMPYEMA Left 10/26/2020   Procedure: VIDEO ASSISTED THORACOSCOPY (VATS)/EMPYEMA ,INSERTION OF LEFT PLEURAL CHEST TUBES X 3;  Surgeon: Ivin Poot, MD;  Location: [facility];  Service: Thoracic;  Laterality: Left;    Social History   Social History Narrative   HSG, Daiva Nakayama - [employer]. Married '76  2 sons - '77, '80. Marriage in good health. [employer], plus a lot business. Very active and feeling.    Immunization History  Administered Date(s) Administered   Fluad Quad(high Dose 65+) 07/24/2019, 08/02/2022   Hepatitis A, Adult 03/07/2020, 09/06/2020   Hepb-cpg 03/07/2020, 04/06/2020   Influenza Whole 08/06/2009, 09/02/2010, 09/08/2012   Influenza, High Dose Seasonal PF 07/17/2020   Influenza,inj,Quad PF,6+ Mos 08/06/2014, 07/31/2017, 09/22/2018, 08/11/2019   Influenza-Unspecified 06/29/2015, 07/17/2020, 08/12/2021   PFIZER(Purple Top)SARS-COV-2 Vaccination 01/19/2020, 02/13/2020, 08/14/2020, 04/06/2021   PNEUMOCOCCAL CONJUGATE-20 08/02/2022   Pfizer Covid-19 Vaccine Bivalent Booster 69yr & up 08/12/2021   Pneumococcal Conjugate-13 11/13/2020   Td 09/02/2010   Tdap 03/07/2020   Zoster Recombinat (Shingrix) 12/09/2018, 07/24/2019   Zoster, Live 07/27/2015     Objective: Vital Signs: There were no vitals taken for this visit.   Physical Exam   Musculoskeletal Exam: ***  CDAI Exam: CDAI Score: -- Patient Global: --; Provider Global: -- Swollen: --; Tender: -- Joint Exam 12/11/2022   No joint exam has been documented for this visit   There is currently no information documented on the homunculus. Go to the Rheumatology activity and complete the homunculus joint exam.  Investigation: No additional findings.  Imaging: No results found.  Recent Labs: Lab Results  Component Value Date   WBC 8.0 05/15/2022   HGB 13.0 05/15/2022   PLT 159.0 05/15/2022   NA 139 05/15/2022   K 4.5 05/15/2022   CL 104 05/15/2022   CO2 30 05/15/2022   GLUCOSE 83 05/15/2022   BUN 24 (H) 05/15/2022   CREATININE 0.89 05/15/2022   BILITOT 0.3 11/13/2020   ALKPHOS 82 11/13/2020   AST 23 11/13/2020   ALT 5 11/13/2020   PROT 7.0 11/13/2020   ALBUMIN 3.6 11/13/2020   CALCIUM 9.5  05/15/2022   GFRAA >90 03/27/2014    Speciality Comments: No specialty comments available.  Procedures:  No procedures performed Allergies: Penicillins and Codeine   Assessment / Plan:     Visit Diagnoses: Positive ANA (antinuclear antibody) - 05/15/22: ANA 1:40 nuclear, discrete nuclear dots, 1:80NS, CRP<1, anti-CCP<16, RF<14  Essential hypertension  New onset right bundle branch block (RBBB)  Aneurysm of ascending aorta without rupture (HCC)  COPD (chronic obstructive pulmonary disease) with acute bronchitis (HCC)  History of gastroesophageal reflux (GERD)  Acquired hypothyroidism  Primary osteoarthritis of both hands  Primary osteoarthritis of both knees  DDD (degenerative disc disease), lumbar  Benign prostatic hyperplasia without lower urinary tract symptoms  Hyperlipidemia with target LDL less than 100  Iron deficiency anemia due to dietary causes  Prediabetes  Primary insomnia  Thiamine deficiency  Vitamin D deficiency  Zinc deficiency  Cannabis abuse  Orders: No orders of the defined types were placed in this encounter.  No orders of the defined types were placed in this encounter.   Face-to-face time spent with patient was *** minutes. Greater than 50% of time was spent in counseling and coordination of care.  Follow-Up Instructions: No follow-ups on file.   Ofilia Neas, PA-C  Note - This record has been created using Dragon software.  Chart creation errors have been sought, but may not always  have been located. Such creation errors do not reflect on  the standard of medical care.,

## 2022-12-11 ENCOUNTER — Ambulatory Visit: Payer: PPO | Admitting: Rheumatology

## 2022-12-11 DIAGNOSIS — F1210 Cannabis abuse, uncomplicated: Secondary | ICD-10-CM

## 2022-12-11 DIAGNOSIS — J209 Acute bronchitis, unspecified: Secondary | ICD-10-CM

## 2022-12-11 DIAGNOSIS — E039 Hypothyroidism, unspecified: Secondary | ICD-10-CM

## 2022-12-11 DIAGNOSIS — R768 Other specified abnormal immunological findings in serum: Secondary | ICD-10-CM

## 2022-12-11 DIAGNOSIS — F5101 Primary insomnia: Secondary | ICD-10-CM

## 2022-12-11 DIAGNOSIS — Z8719 Personal history of other diseases of the digestive system: Secondary | ICD-10-CM

## 2022-12-11 DIAGNOSIS — R7303 Prediabetes: Secondary | ICD-10-CM

## 2022-12-11 DIAGNOSIS — I10 Essential (primary) hypertension: Secondary | ICD-10-CM

## 2022-12-11 DIAGNOSIS — M170 Bilateral primary osteoarthritis of knee: Secondary | ICD-10-CM

## 2022-12-11 DIAGNOSIS — E519 Thiamine deficiency, unspecified: Secondary | ICD-10-CM

## 2022-12-11 DIAGNOSIS — N400 Enlarged prostate without lower urinary tract symptoms: Secondary | ICD-10-CM

## 2022-12-11 DIAGNOSIS — E785 Hyperlipidemia, unspecified: Secondary | ICD-10-CM

## 2022-12-11 DIAGNOSIS — M19041 Primary osteoarthritis, right hand: Secondary | ICD-10-CM

## 2022-12-11 DIAGNOSIS — E60 Dietary zinc deficiency: Secondary | ICD-10-CM

## 2022-12-11 DIAGNOSIS — M5136 Other intervertebral disc degeneration, lumbar region: Secondary | ICD-10-CM

## 2022-12-11 DIAGNOSIS — E559 Vitamin D deficiency, unspecified: Secondary | ICD-10-CM

## 2022-12-11 DIAGNOSIS — I7121 Aneurysm of the ascending aorta, without rupture: Secondary | ICD-10-CM

## 2022-12-11 DIAGNOSIS — D508 Other iron deficiency anemias: Secondary | ICD-10-CM

## 2022-12-11 DIAGNOSIS — I4510 Unspecified right bundle-branch block: Secondary | ICD-10-CM

## 2022-12-22 ENCOUNTER — Other Ambulatory Visit: Payer: Self-pay | Admitting: Internal Medicine

## 2022-12-22 ENCOUNTER — Telehealth: Payer: Self-pay | Admitting: Internal Medicine

## 2022-12-22 DIAGNOSIS — F5101 Primary insomnia: Secondary | ICD-10-CM

## 2022-12-22 MED ORDER — ZOLPIDEM TARTRATE ER 12.5 MG PO TBCR: 12.5000 mg | EXTENDED_RELEASE_TABLET | Freq: Every evening | ORAL | 0 refills | Status: DC | PRN

## 2022-12-22 NOTE — Telephone Encounter (Signed)
Pt called requesting to get his Rx refill for sleeping medicine he has upcoming appt March 25 and will like an refill to carry him over

## 2023-01-08 ENCOUNTER — Ambulatory Visit: Payer: PPO | Attending: Internal Medicine | Admitting: Internal Medicine

## 2023-01-17 ENCOUNTER — Other Ambulatory Visit: Payer: Self-pay | Admitting: Internal Medicine

## 2023-01-17 DIAGNOSIS — I10 Essential (primary) hypertension: Secondary | ICD-10-CM

## 2023-01-26 ENCOUNTER — Ambulatory Visit: Payer: PPO | Attending: Internal Medicine | Admitting: Internal Medicine

## 2023-01-26 ENCOUNTER — Encounter: Payer: Self-pay | Admitting: Internal Medicine

## 2023-01-26 VITALS — BP 128/90 | HR 77 | Ht 74.0 in | Wt 228.0 lb

## 2023-01-26 DIAGNOSIS — R9439 Abnormal result of other cardiovascular function study: Secondary | ICD-10-CM

## 2023-01-26 DIAGNOSIS — E785 Hyperlipidemia, unspecified: Secondary | ICD-10-CM | POA: Diagnosis not present

## 2023-01-26 NOTE — Patient Instructions (Signed)
Medication Instructions:   Your physician recommends that you continue on your current medications as directed. Please refer to the Current Medication list given to you today.  *If you need a refill on your cardiac medications before your next appointment, please call your pharmacy*  Lab Work: Your physician recommends that you return for lab work TODAY:  Fasting Lipid Panel  If you have labs (blood work) drawn today and your tests are completely normal, you will receive your results only by: MyChart Message (if you have MyChart) OR A paper copy in the mail If you have any lab test that is abnormal or we need to change your treatment, we will call you to review the results.  Testing/Procedures: NONE ordered at this time of appointment   Follow-Up: At [facility], you and your health needs are our priority.  As part of our continuing mission to provide you with exceptional heart care, we have created designated Provider Care Teams.  These Care Teams include your primary Cardiologist (physician) and Advanced Practice Providers (APPs -  Physician Assistants and Nurse Practitioners) who all work together to provide you with the care you need, when you need it.    Your next appointment:   1 year(s)  Provider:   Branch, Mary E, MD     Other Instructions  

## 2023-01-26 NOTE — Progress Notes (Signed)
Cardiology Office Note:    Date:  01/26/2023   ID:  [name] [name], DOB 1956-02-10, MRN [id number]  PCP:  [name] Lima, MD   [facility] Providers Cardiologist:  [name] Mayo, MD     Referring MD: [name] Lima, MD   Chief Complaint  Patient presents with   Follow-up    6 months.  Positive Lexiscan  History of Present Illness:    [name] [name] is a 67 y.o. male with a hx of GERD, HTN, DM2 - A1c is well controlled, prior lap band sx, referral for abnormal SPECT  Patient saw his PCP Dr. Ronnald [name] in July. His blood pressure was poorly controlled 166/100 mmHg. EKG showed sinus bradycardia HR 59 bpm, septal Q and PACs. He obtained a troponin which was normal. BNP was normal. A lexiscan SPECT was obtained showing possible small apical scar v. Apical thinning. With apical WMA this was felt to be related to scar.  He has no hx of known CAD. He was admitted December 2021 with PNA c/b L empyema. He underwent VATS and drainage of empyema and decortication of the L lung by Dr. Prescott [name].  He denies CP or SOB. He works 65-85 hrs a week. 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    Interim Hx 01/26/2023 No changes. No hospitalizations. He is doing very well  Cardiology Studies EKG show NSR 05/15/2022- no ischemic changes  SPECT 06/26/2022- c/f small apical scar with WMA in this area  TTE 07/04/2022 - EF 60-65%, trivial MR,  Past Medical History:  Diagnosis Date   Allergy    Diabetes mellitus    type 2-off all meds now after wt loss   Empyema ([facility]) 10/2020   GERD (gastroesophageal reflux disease)    Hyperlipidemia    Hyperlipidemia    Hypertension    Osteoarthritis    hands, mild, not disabling    Past Surgical History:  Procedure Laterality Date   Arthroscopy X 1 left ([name])     X 2 right   COLONOSCOPY     correction of deviated septum     DECORTICATION Left 10/26/2020   Procedure: DECORTICATION OF LEFT LUNG, APPLICATION OF ON Q-PUMP FOR PAIN  MANAGEMENT;  Surgeon: [name] Poot, MD;  Location: [facility];  Service: Thoracic;  Laterality: Left;   GANGLION CYST EXCISION     right wrist (kuzma)   GASTRIC BANDING PORT REVISION  8/11   PILONIDAL CYST / SINUS EXCISION  67 yrs old   [place] Right 09/21/2013   Procedure: ULNAR COLLATERAL LIGAMENT REPAIR METACARPAL PHALANGE RIGHT THUMB POSSIBLE APL GRAFT RECONSTRUCTION;  Surgeon: [name] Sours, MD;  Location: [facility];  Service: Orthopedics;  Laterality: Right;   VIDEO ASSISTED THORACOSCOPY (VATS)/EMPYEMA Left 10/26/2020   Procedure: VIDEO ASSISTED THORACOSCOPY (VATS)/EMPYEMA ,INSERTION OF LEFT PLEURAL CHEST TUBES X 3;  Surgeon: [name] [name], [name] Salina, MD;  Location: [facility];  Service: Thoracic;  Laterality: Left;    Current Medications: Current Meds  Medication Sig   acetaminophen (TYLENOL) 650 MG CR tablet Take 650 mg by mouth every 8 (eight) hours as needed for pain.   aspirin EC 81 MG tablet Take 1 tablet (81 mg total) by mouth daily. Swallow whole.   cholecalciferol (VITAMIN D3) 25 MCG (1000 UNIT) tablet Take 1,000 Units by mouth daily.   Multiple Vitamin (MULTIVITAMIN) tablet Take 1 tablet by mouth daily.   olmesartan (BENICAR) 20 MG  tablet TAKE 1 TABLET BY MOUTH EVERY DAY   pantoprazole (PROTONIX) 40 MG tablet TAKE 1 TABLET BY MOUTH EVERY DAY   rosuvastatin (CRESTOR) 10 MG tablet TAKE 1 TABLET BY MOUTH EVERY DAY   tadalafil (CIALIS) 20 MG tablet Take 1 tablet (20 mg total) by mouth daily as needed for erectile dysfunction.   traMADol (ULTRAM) 50 MG tablet TAKE 1 TABLET BY MOUTH EVERY 8 HOURS AS NEEDED   Turmeric 450 MG CAPS Take 1 capsule by mouth 2 (two) times daily with a meal.   zinc gluconate ([employer] ZINC GLUCONATE) 50 MG tablet TAKE 1 TABLET (50 MG TOTAL) BY MOUTH EVERY OTHER DAY.   zolpidem (AMBIEN CR) 12.5 MG CR tablet Take 1 tablet (12.5 mg total) by mouth at bedtime as needed for sleep.     Allergies:   Penicillins and  Codeine   Social History   Socioeconomic History   Marital status: Married    Spouse name: Not on file   Number of children: 2   Years of education: 12   Highest education level: Not on file  Occupational History   Occupation: Trucking     Employer: [employer]  Tobacco Use   Smoking status: Former    Types: Cigarettes    Quit date: 06/13/1995    Years since quitting: 27.6    Passive exposure: Past   Smokeless tobacco: Never  Vaping Use   Vaping Use: Never used  Substance and Sexual Activity   Alcohol use: No    Alcohol/week: 0.0 standard drinks of alcohol   Drug use: No   Sexual activity: Yes    Partners: Female  Other Topics Concern   Not on file  Social History Narrative   HSG, [name] [name] - [employer]. Married '76  2 sons - '77, '80. Marriage in good health. [employer], plus a lot business. Very active and feeling.    Social Determinants of Health   Financial Resource Strain: Low Risk  (03/24/2022)   Overall Financial Resource Strain (CARDIA)    Difficulty of Paying Living Expenses: Not hard at all  Food Insecurity: No Food Insecurity (06/06/2022)   Hunger Vital Sign    Worried About Running Out of Food in the Last Year: Never true    Ran Out of Food in the Last Year: Never true  Transportation Needs: No Transportation Needs (06/06/2022)   PRAPARE - Hydrologist (Medical): No    Lack of Transportation (Non-Medical): No  Physical Activity: Sufficiently Active (03/24/2022)   Exercise Vital Sign    Days of Exercise per Week: 3 days    Minutes of Exercise per Session: 60 min  Stress: No Stress Concern Present (03/24/2022)   [name]    Feeling of Stress : Not at all  Social Connections: Unknown (03/24/2022)   Social Connection and Isolation Panel [NHANES]    Frequency of Communication with Friends and Family: More than three times a week    Frequency of Social  Gatherings with Friends and Family: More than three times a week    Attends Religious Services: Patient declined    Marine scientist or Organizations: Yes    Attends Archivist Meetings: Patient declined    Marital Status: Married     Family History: The patient's family history includes Cancer in his father and mother; Coronary artery disease in his father; Heart attack in his father; Heart disease in his  father; Hypertension in his mother; Parkinsonism in his maternal grandfather. There is no history of Diabetes, Colon cancer, Colon polyps, Esophageal cancer, Rectal cancer, or Stomach cancer. Father had significant MI in his 4s. Uncle passed of MI 62s  ROS:   Please see the history of present illness.     All other systems reviewed and are negative.  EKGs/Labs/Other Studies Reviewed:    The following studies were reviewed today:   EKG:  EKG is  ordered today.  The ekg ordered today demonstrates   07/03/2022- NSR with artifact  01/26/2023- NSR with PVCs, septal infarct  Recent Labs: 05/15/2022: BUN 24; Creatinine, Ser 0.89; Hemoglobin 13.0; Platelets 159.0; Potassium 4.5; Pro B Natriuretic peptide (BNP) 85.0; Sodium 139; TSH 2.13   Recent Lipid Panel    Component Value Date/Time   CHOL 175 05/15/2022 1557   TRIG 152.0 (H) 05/15/2022 1557   TRIG 449 (HH) 11/18/2006 1011   HDL 47.60 05/15/2022 1557   CHOLHDL 4 05/15/2022 1557   VLDL 30.4 05/15/2022 1557   LDLCALC 97 05/15/2022 1557   LDLDIRECT 85.0 06/13/2021 1635     Risk Assessment/Calculations:     Physical Exam:    VS:   Vitals:   01/26/23 0757  BP: (!) 128/90  Pulse: 77      Wt Readings from Last 3 Encounters:  01/26/23 228 lb (103.4 kg)  07/03/22 231 lb 12.8 oz (105.1 kg)  06/26/22 226 lb (102.5 kg)     GEN:  Well nourished, well developed in no acute distress HEENT: Normal NECK: No JVD; No carotid bruits LYMPHATICS: No lymphadenopathy CARDIAC: RRR, no murmurs, rubs,  gallops RESPIRATORY:  Clear to auscultation without rales, wheezing or rhonchi  ABDOMEN: Soft, non-tender, non-distended MUSCULOSKELETAL:  No edema; No deformity  SKIN: Warm and dry NEUROLOGIC:  Alert and oriented x 3 PSYCHIATRIC:  Normal affect   ASSESSMENT:   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 He is currently asymptomatic and very active.  Will recommend ASA and LDL < 70 mg/dL.  Started crestor 10 mg daily.  A1c 5.8. Goal <7.  Discussed the importance of the Mediterranean diet in the past. PLAN:    In order of problems listed above:  Lipid profile Follow up in 12 months   Medication Adjustments/Labs and Tests Ordered: Current medicines are reviewed at length with the patient today.  Concerns regarding medicines are outlined above.  Orders Placed This Encounter  Procedures   EKG 12-Lead   No orders of the defined types were placed in this encounter.   There are no Patient Instructions on file for this visit.   Signed, [name] Mayo, MD  01/26/2023 8:13 AM    [place]

## 2023-01-27 LAB — LIPID PANEL
Chol/HDL Ratio: 2.4 ratio (ref 0.0–5.0)
Cholesterol, Total: 125 mg/dL (ref 100–199)
HDL: 53 mg/dL (ref >39)
LDL Chol Calc (NIH): 55 mg/dL (ref 0–99)
Triglycerides: 86 mg/dL (ref 0–149)
VLDL Cholesterol Cal: 17 mg/dL (ref 5–40)

## 2023-01-30 ENCOUNTER — Other Ambulatory Visit: Payer: Self-pay | Admitting: Internal Medicine

## 2023-01-30 DIAGNOSIS — I7121 Aneurysm of the ascending aorta, without rupture: Secondary | ICD-10-CM

## 2023-02-02 ENCOUNTER — Other Ambulatory Visit: Payer: Self-pay | Admitting: Internal Medicine

## 2023-02-02 ENCOUNTER — Ambulatory Visit (INDEPENDENT_AMBULATORY_CARE_PROVIDER_SITE_OTHER): Payer: PPO | Admitting: Internal Medicine

## 2023-02-02 ENCOUNTER — Encounter: Payer: Self-pay | Admitting: Internal Medicine

## 2023-02-02 VITALS — BP 138/82 | HR 77 | Temp 98.0°F | Resp 16 | Ht 74.0 in | Wt 224.0 lb

## 2023-02-02 DIAGNOSIS — E519 Thiamine deficiency, unspecified: Secondary | ICD-10-CM | POA: Diagnosis not present

## 2023-02-02 DIAGNOSIS — Z0000 Encounter for general adult medical examination without abnormal findings: Secondary | ICD-10-CM | POA: Diagnosis not present

## 2023-02-02 DIAGNOSIS — I10 Essential (primary) hypertension: Secondary | ICD-10-CM

## 2023-02-02 DIAGNOSIS — E60 Dietary zinc deficiency: Secondary | ICD-10-CM | POA: Diagnosis not present

## 2023-02-02 DIAGNOSIS — E039 Hypothyroidism, unspecified: Secondary | ICD-10-CM

## 2023-02-02 DIAGNOSIS — N400 Enlarged prostate without lower urinary tract symptoms: Secondary | ICD-10-CM | POA: Diagnosis not present

## 2023-02-02 DIAGNOSIS — R195 Other fecal abnormalities: Secondary | ICD-10-CM | POA: Insufficient documentation

## 2023-02-02 DIAGNOSIS — Z9884 Bariatric surgery status: Secondary | ICD-10-CM | POA: Diagnosis not present

## 2023-02-02 DIAGNOSIS — D508 Other iron deficiency anemias: Secondary | ICD-10-CM

## 2023-02-02 DIAGNOSIS — E785 Hyperlipidemia, unspecified: Secondary | ICD-10-CM

## 2023-02-02 LAB — HEPATIC FUNCTION PANEL
ALT: 22 U/L (ref 0–53)
AST: 27 U/L (ref 0–37)
Albumin: 4.4 g/dL (ref 3.5–5.2)
Alkaline Phosphatase: 80 U/L (ref 39–117)
Bilirubin, Direct: 0.3 mg/dL (ref 0.0–0.3)
Total Bilirubin: 1.2 mg/dL (ref 0.2–1.2)
Total Protein: 7.1 g/dL (ref 6.0–8.3)

## 2023-02-02 LAB — BASIC METABOLIC PANEL
BUN: 19 mg/dL (ref 6–23)
CO2: 29 mEq/L (ref 19–32)
Calcium: 9.3 mg/dL (ref 8.4–10.5)
Chloride: 105 mEq/L (ref 96–112)
Creatinine, Ser: 0.95 mg/dL (ref 0.40–1.50)
GFR: 82.76 mL/min (ref >60.00)
Glucose, Bld: 94 mg/dL (ref 70–99)
Potassium: 4.9 mEq/L (ref 3.5–5.1)
Sodium: 142 mEq/L (ref 135–145)

## 2023-02-02 LAB — CBC WITH DIFFERENTIAL/PLATELET
Basophils Absolute: 0 10*3/uL (ref 0.0–0.1)
Basophils Relative: 0.3 % (ref 0.0–3.0)
Eosinophils Absolute: 0.1 10*3/uL (ref 0.0–0.7)
Eosinophils Relative: 1.2 % (ref 0.0–5.0)
HCT: 39.4 % (ref 39.0–52.0)
Hemoglobin: 12.9 g/dL — ABNORMAL LOW (ref 13.0–17.0)
Lymphocytes Relative: 17.4 % (ref 12.0–46.0)
Lymphs Abs: 2 10*3/uL (ref 0.7–4.0)
MCHC: 32.8 g/dL (ref 30.0–36.0)
MCV: 88.7 fl (ref 78.0–100.0)
Monocytes Absolute: 1 10*3/uL (ref 0.1–1.0)
Monocytes Relative: 8.9 % (ref 3.0–12.0)
Neutro Abs: 8.4 10*3/uL — ABNORMAL HIGH (ref 1.4–7.7)
Neutrophils Relative %: 72.2 % (ref 43.0–77.0)
Platelets: 164 10*3/uL (ref 150.0–400.0)
RBC: 4.44 Mil/uL (ref 4.22–5.81)
RDW: 13.4 % (ref 11.5–15.5)
WBC: 11.6 10*3/uL — ABNORMAL HIGH (ref 4.0–10.5)

## 2023-02-02 LAB — URINALYSIS, ROUTINE W REFLEX MICROSCOPIC
Bilirubin Urine: NEGATIVE
Hgb urine dipstick: NEGATIVE
Ketones, ur: NEGATIVE
Leukocytes,Ua: NEGATIVE
Nitrite: NEGATIVE
RBC / HPF: NONE SEEN (ref 0–?)
Specific Gravity, Urine: 1.02 (ref 1.000–1.030)
Total Protein, Urine: NEGATIVE
Urine Glucose: NEGATIVE
Urobilinogen, UA: 0.2 (ref 0.0–1.0)
pH: 6 (ref 5.0–8.0)

## 2023-02-02 MED ORDER — ZINC GLUCONATE 50 MG PO TABS: 50.0000 mg | ORAL_TABLET | Freq: Every day | ORAL | 1 refills | Status: DC

## 2023-02-02 NOTE — Progress Notes (Unsigned)
Subjective:  Patient ID: [name] [name], male    DOB: 1956/08/18  Age: 67 y.o. MRN: [id number]  CC: No chief complaint on file.   HPI KASPEN MURK presents for ***  Outpatient Medications Prior to Visit  Medication Sig Dispense Refill   acetaminophen (TYLENOL) 650 MG CR tablet Take 650 mg by mouth every 8 (eight) hours as needed for pain.     aspirin EC 81 MG tablet Take 1 tablet (81 mg total) by mouth daily. Swallow whole. 90 tablet 3   cholecalciferol (VITAMIN D3) 25 MCG (1000 UNIT) tablet Take 1,000 Units by mouth daily.     Multiple Vitamin (MULTIVITAMIN) tablet Take 1 tablet by mouth daily.     olmesartan (BENICAR) 20 MG tablet TAKE 1 TABLET BY MOUTH EVERY DAY 90 tablet 0   pantoprazole (PROTONIX) 40 MG tablet TAKE 1 TABLET BY MOUTH EVERY DAY 90 tablet 1   rosuvastatin (CRESTOR) 10 MG tablet TAKE 1 TABLET BY MOUTH EVERY DAY 90 tablet 1   tadalafil (CIALIS) 20 MG tablet Take 1 tablet (20 mg total) by mouth daily as needed for erectile dysfunction. 20 tablet 5   traMADol (ULTRAM) 50 MG tablet TAKE 1 TABLET BY MOUTH EVERY 8 HOURS AS NEEDED 90 tablet 3   Turmeric 450 MG CAPS Take 1 capsule by mouth 2 (two) times daily with a meal. 180 capsule 3   zinc gluconate ([employer] ZINC GLUCONATE) 50 MG tablet TAKE 1 TABLET (50 MG TOTAL) BY MOUTH EVERY OTHER DAY. 45 tablet 1   zolpidem (AMBIEN CR) 12.5 MG CR tablet Take 1 tablet (12.5 mg total) by mouth at bedtime as needed for sleep. 90 tablet 0   No facility-administered medications prior to visit.    ROS Review of Systems  Objective:  BP (!) 144/82 (BP Location: Right Arm, Patient Position: Sitting, Cuff Size: Large)   Pulse 77   Temp 98 F (36.7 C) (Oral)   Ht 6\' 2"  (1.88 m)   Wt 224 lb (101.6 kg)   SpO2 95%   BMI 28.76 kg/m   BP Readings from Last 3 Encounters:  02/02/23 (!) 144/82  01/26/23 (!) 128/90  07/03/22 134/80    Wt Readings from Last 3 Encounters:  02/02/23 224 lb (101.6 kg)  01/26/23 228 lb (103.4 kg)   07/03/22 231 lb 12.8 oz (105.1 kg)    Physical Exam Abdominal:     Hernia: There is no hernia in the left inguinal area or right inguinal area.  Genitourinary:    Pubic Area: No rash.      Penis: Normal and circumcised.      Testes: Normal.     Epididymis:     Right: Normal.     Left: Normal.     Prostate: Not enlarged, not tender and no nodules present.     Rectum: Guaiac result positive. No mass, tenderness, anal fissure, external hemorrhoid or internal hemorrhoid. Normal anal tone.  Lymphadenopathy:     Lower Body: No right inguinal adenopathy. No left inguinal adenopathy.     Lab Results  Component Value Date   WBC 8.0 05/15/2022   HGB 13.0 05/15/2022   HCT 39.8 05/15/2022   PLT 159.0 05/15/2022   GLUCOSE 83 05/15/2022   CHOL 125 01/26/2023   TRIG 86 01/26/2023   HDL 53 01/26/2023   LDLDIRECT 85.0 06/13/2021   LDLCALC 55 01/26/2023   ALT 5 11/13/2020   AST 23 11/13/2020   NA 139 05/15/2022   K 4.5 05/15/2022  CL 104 05/15/2022   CREATININE 0.89 05/15/2022   BUN 24 (H) 05/15/2022   CO2 30 05/15/2022   TSH 2.13 05/15/2022   PSA 0.35 06/13/2021   INR 1.0 11/13/2020   HGBA1C 5.8 05/15/2022   MICROALBUR 4.2 (H) 12/30/2007    ECHOCARDIOGRAM COMPLETE  Result Date: 07/08/2022    ECHOCARDIOGRAM REPORT   Patient Name:   [name] [name] Date of Exam: 07/04/2022 Medical Rec #:  [id number]       Height:       74.0 in Accession #:    [id number]      Weight:       231.8 lb Date of Birth:  [birth year]       BSA:          2.314 m Patient Age:    38 years        BP:           155/91 mmHg Patient Gender: M               HR:           59 bpm. Exam Location:  [facility] Procedure: 2D Echo, Cardiac Doppler, Color Doppler and 3D Echo Indications:    Abnormal stress test  History:        Patient has no prior history of Echocardiogram examinations.                 Previous Myocardial Infarction.  Sonographer:    Merrie Roof RDCS Referring Phys: [place]  1. Left  ventricular ejection fraction, by estimation, is 60 to 65%. The left ventricle has normal function. The left ventricle has no regional wall motion abnormalities. Left ventricular diastolic parameters are consistent with Grade I diastolic dysfunction (impaired relaxation).  2. Right ventricular systolic function is normal. The right ventricular size is normal. There is mildly elevated pulmonary artery systolic pressure.  3. Left atrial size was moderately dilated.  4. The mitral valve is normal in structure. Mild to moderate mitral valve regurgitation. No evidence of mitral stenosis.  5. The aortic valve is normal in structure. Aortic valve regurgitation is not visualized. No aortic stenosis is present.  6. There is mild dilatation of the ascending aorta, measuring 39 mm.  7. The inferior vena cava is normal in size with greater than 50% respiratory variability, suggesting right atrial pressure of 3 mmHg. FINDINGS  Left Ventricle: Left ventricular ejection fraction, by estimation, is 60 to 65%. The left ventricle has normal function. The left ventricle has no regional wall motion abnormalities. The left ventricular internal cavity size was normal in size. There is  no left ventricular hypertrophy. Left ventricular diastolic parameters are consistent with Grade I diastolic dysfunction (impaired relaxation). Right Ventricle: The right ventricular size is normal. No increase in right ventricular wall thickness. Right ventricular systolic function is normal. There is mildly elevated pulmonary artery systolic pressure. The tricuspid regurgitant velocity is 2.92  m/s, and with an assumed right atrial pressure of 3 mmHg, the estimated right ventricular systolic pressure is [id number] mmHg. Left Atrium: Left atrial size was moderately dilated. Right Atrium: Right atrial size was normal in size. Pericardium: There is no evidence of pericardial effusion. Mitral Valve: The mitral valve is normal in structure. Mild to moderate mitral  valve regurgitation. No evidence of mitral valve stenosis. Tricuspid Valve: The tricuspid valve is normal in structure. Tricuspid valve regurgitation is mild . No evidence of tricuspid stenosis. Aortic Valve: The aortic valve is  normal in structure. Aortic valve regurgitation is not visualized. No aortic stenosis is present. Aortic valve mean gradient measures 6.0 mmHg. Aortic valve peak gradient measures 11.2 mmHg. Aortic valve area, by VTI measures 1.87 cm. Pulmonic Valve: The pulmonic valve was normal in structure. Pulmonic valve regurgitation is not visualized. No evidence of pulmonic stenosis. Aorta: The aortic root is normal in size and structure. There is mild dilatation of the ascending aorta, measuring 39 mm. Venous: The inferior vena cava is normal in size with greater than 50% respiratory variability, suggesting right atrial pressure of 3 mmHg. IAS/Shunts: No atrial level shunt detected by color flow Doppler.  LEFT VENTRICLE PLAX 2D LVIDd:         6.00 cm   Diastology LVIDs:         3.80 cm   LV e' medial:    6.09 cm/s LV PW:         1.20 cm   LV E/e' medial:  11.4 LV IVS:        1.20 cm   LV e' lateral:   8.38 cm/s LVOT diam:     2.20 cm   LV E/e' lateral: 8.3 LV SV:         71 LV SV Index:   31 LVOT Area:     3.80 cm                           3D Volume EF:                          3D EF:        56 %                          LV EDV:       204 ml                          LV ESV:       90 ml                          LV SV:        114 ml RIGHT VENTRICLE RV S prime:     13.40 cm/s TAPSE (M-mode): 2.4 cm LEFT ATRIUM             Index        RIGHT ATRIUM           Index LA diam:        4.80 cm 2.07 cm/m   RA Area:     19.40 cm LA Vol (A2C):   69.9 ml 30.21 ml/m  RA Volume:   52.30 ml  22.60 ml/m LA Vol (A4C):   88.5 ml 38.24 ml/m LA Biplane Vol: 83.4 ml 36.04 ml/m  AORTIC VALVE AV Area (Vmax):    1.83 cm AV Area (Vmean):   1.91 cm AV Area (VTI):     1.87 cm AV Vmax:           167.00 cm/s AV Vmean:           114.000 cm/s AV VTI:            0.381 m AV Peak Grad:      11.2 mmHg AV Mean Grad:      6.0 mmHg LVOT Vmax:  80.30 cm/s LVOT Vmean:        57.200 cm/s LVOT VTI:          0.187 m LVOT/AV VTI ratio: 0.49  AORTA Ao Root diam: 3.90 cm Ao Asc diam:  3.90 cm MITRAL VALVE               TRICUSPID VALVE MV Area (PHT): 3.48 cm    TR Peak grad:   34.1 mmHg MV Decel Time: 218 msec    TR Vmax:        292.00 cm/s MV A velocity: 87.00 cm/s MV E/A ratio:  0.80        SHUNTS                            Systemic VTI:  0.19 m                            Systemic Diam: 2.20 cm Jyl Heinz MD Electronically signed by Jyl Heinz MD Signature Date/Time: 07/08/2022/10:08:39 AM    Final     Assessment & Plan:  Benign prostatic hyperplasia without lower urinary tract symptoms -     PSA; Future -     Urinalysis, Routine w reflex microscopic; Future  Essential hypertension -     Basic metabolic panel; Future  Hyperlipidemia with target LDL less than 100 -     Lipid panel; Future -     Hepatic function panel; Future  Acquired hypothyroidism -     TSH; Future  Iron deficiency anemia due to dietary causes -     CBC with Differential/Platelet; Future  Zinc deficiency -     CBC with Differential/Platelet; Future  Thiamine deficiency -     CBC with Differential/Platelet; Future  Routine general medical examination at a health care facility     Follow-up: No follow-ups on file.  Scarlette Calico, MD

## 2023-02-02 NOTE — Patient Instructions (Signed)
Health Maintenance, Male Adopting a healthy lifestyle and getting preventive care are important in promoting health and wellness. Ask your health care provider about: The right schedule for you to have regular tests and exams. Things you can do on your own to prevent diseases and keep yourself healthy. What should I know about diet, weight, and exercise? Eat a healthy diet  Eat a diet that includes plenty of vegetables, fruits, low-fat dairy products, and lean protein. Do not eat a lot of foods that are high in solid fats, added sugars, or sodium. Maintain a healthy weight Body mass index (BMI) is a measurement that can be used to identify possible weight problems. It estimates body fat based on height and weight. Your health care provider can help determine your BMI and help you achieve or maintain a healthy weight. Get regular exercise Get regular exercise. This is one of the most important things you can do for your health. Most adults should: Exercise for at least 150 minutes each week. The exercise should increase your heart rate and make you sweat (moderate-intensity exercise). Do strengthening exercises at least twice a week. This is in addition to the moderate-intensity exercise. Spend less time sitting. Even light physical activity can be beneficial. Watch cholesterol and blood lipids Have your blood tested for lipids and cholesterol at 68 years of age, then have this test every 5 years. You may need to have your cholesterol levels checked more often if: Your lipid or cholesterol levels are high. You are older than 68 years of age. You are at high risk for heart disease. What should I know about cancer screening? Many types of cancers can be detected early and may often be prevented. Depending on your health history and family history, you may need to have cancer screening at various ages. This may include screening for: Colorectal cancer. Prostate cancer. Skin cancer. Lung  cancer. What should I know about heart disease, diabetes, and high blood pressure? Blood pressure and heart disease High blood pressure causes heart disease and increases the risk of stroke. This is more likely to develop in people who have high blood pressure readings or are overweight. Talk with your health care provider about your target blood pressure readings. Have your blood pressure checked: Every 3-5 years if you are 18-39 years of age. Every year if you are 40 years old or older. If you are between the ages of 65 and 75 and are a current or former smoker, ask your health care provider if you should have a one-time screening for abdominal aortic aneurysm (AAA). Diabetes Have regular diabetes screenings. This checks your fasting blood sugar level. Have the screening done: Once every three years after age 45 if you are at a normal weight and have a low risk for diabetes. More often and at a younger age if you are overweight or have a high risk for diabetes. What should I know about preventing infection? Hepatitis B If you have a higher risk for hepatitis B, you should be screened for this virus. Talk with your health care provider to find out if you are at risk for hepatitis B infection. Hepatitis C Blood testing is recommended for: Everyone born from 1945 through 1965. Anyone with known risk factors for hepatitis C. Sexually transmitted infections (STIs) You should be screened each year for STIs, including gonorrhea and chlamydia, if: You are sexually active and are younger than 68 years of age. You are older than 68 years of age and your   health care provider tells you that you are at risk for this type of infection. Your sexual activity has changed since you were last screened, and you are at increased risk for chlamydia or gonorrhea. Ask your health care provider if you are at risk. Ask your health care provider about whether you are at high risk for HIV. Your health care provider  may recommend a prescription medicine to help prevent HIV infection. If you choose to take medicine to prevent HIV, you should first get tested for HIV. You should then be tested every 3 months for as long as you are taking the medicine. Follow these instructions at home: Alcohol use Do not drink alcohol if your health care provider tells you not to drink. If you drink alcohol: Limit how much you have to 0-2 drinks a day. Know how much alcohol is in your drink. In the [place] one drink equals one 12 oz bottle of beer (355 mL), one 5 oz glass of wine (148 mL), or one 1 oz glass of hard liquor (44 mL). Lifestyle Do not use any products that contain nicotine or tobacco. These products include cigarettes, chewing tobacco, and vaping devices, such as e-cigarettes. If you need help quitting, ask your health care provider. Do not use street drugs. Do not share needles. Ask your health care provider for help if you need support or information about quitting drugs. General instructions Schedule regular health, dental, and eye exams. Stay current with your vaccines. Tell your health care provider if: You often feel depressed. You have ever been abused or do not feel safe at home. Summary Adopting a healthy lifestyle and getting preventive care are important in promoting health and wellness. Follow your health care provider's instructions about healthy diet, exercising, and getting tested or screened for diseases. Follow your health care provider's instructions on monitoring your cholesterol and blood pressure. This information is not intended to replace advice given to you by your health care provider. Make sure you discuss any questions you have with your health care provider. Document Revised: 03/18/2021 Document Reviewed: 03/18/2021 Elsevier Patient Education  2023 [employer]  

## 2023-02-03 LAB — PSA: PSA: 0.38 ng/mL (ref 0.10–4.00)

## 2023-02-03 LAB — IBC + FERRITIN
Ferritin: 56.9 ng/mL (ref 22.0–322.0)
Iron: 36 ug/dL — ABNORMAL LOW (ref 42–165)
Saturation Ratios: 11.8 % — ABNORMAL LOW (ref 20.0–50.0)
TIBC: 303.8 ug/dL (ref 250.0–450.0)
Transferrin: 217 mg/dL (ref 212.0–360.0)

## 2023-02-03 LAB — TSH: TSH: 1.67 u[IU]/mL (ref 0.35–5.50)

## 2023-02-03 MED ORDER — ACCRUFER 30 MG PO CAPS: 1.0000 | ORAL_CAPSULE | Freq: Two times a day (BID) | ORAL | 1 refills | Status: DC

## 2023-02-06 ENCOUNTER — Other Ambulatory Visit: Payer: Self-pay | Admitting: Internal Medicine

## 2023-02-06 ENCOUNTER — Encounter: Payer: Self-pay | Admitting: Internal Medicine

## 2023-02-06 DIAGNOSIS — M51369 Other intervertebral disc degeneration, lumbar region without mention of lumbar back pain or lower extremity pain: Secondary | ICD-10-CM

## 2023-02-06 DIAGNOSIS — M170 Bilateral primary osteoarthritis of knee: Secondary | ICD-10-CM

## 2023-02-06 DIAGNOSIS — Z9884 Bariatric surgery status: Secondary | ICD-10-CM

## 2023-02-06 DIAGNOSIS — M5136 Other intervertebral disc degeneration, lumbar region: Secondary | ICD-10-CM

## 2023-02-06 DIAGNOSIS — D508 Other iron deficiency anemias: Secondary | ICD-10-CM

## 2023-02-06 DIAGNOSIS — M19041 Primary osteoarthritis, right hand: Secondary | ICD-10-CM

## 2023-02-06 LAB — VITAMIN B1

## 2023-02-06 LAB — EXTRA SPECIMEN

## 2023-02-09 ENCOUNTER — Other Ambulatory Visit: Payer: Self-pay | Admitting: Internal Medicine

## 2023-02-09 DIAGNOSIS — D508 Other iron deficiency anemias: Secondary | ICD-10-CM

## 2023-02-09 DIAGNOSIS — Z9884 Bariatric surgery status: Secondary | ICD-10-CM

## 2023-02-09 MED ORDER — ACCRUFER 30 MG PO CAPS: 1.0000 | ORAL_CAPSULE | Freq: Two times a day (BID) | ORAL | 1 refills | Status: DC

## 2023-02-10 ENCOUNTER — Telehealth: Payer: Self-pay | Admitting: Internal Medicine

## 2023-02-10 NOTE — Telephone Encounter (Signed)
scheduled per 4/1 referral , pt has been called and confirmed date and time. Pt is aware of location and to arrive early for check in

## 2023-02-14 ENCOUNTER — Other Ambulatory Visit: Payer: Self-pay | Admitting: Internal Medicine

## 2023-02-14 DIAGNOSIS — E785 Hyperlipidemia, unspecified: Secondary | ICD-10-CM

## 2023-02-14 DIAGNOSIS — M5136 Other intervertebral disc degeneration, lumbar region: Secondary | ICD-10-CM

## 2023-02-16 ENCOUNTER — Other Ambulatory Visit: Payer: Self-pay | Admitting: Internal Medicine

## 2023-02-16 DIAGNOSIS — K2101 Gastro-esophageal reflux disease with esophagitis, with bleeding: Secondary | ICD-10-CM

## 2023-02-16 DIAGNOSIS — E785 Hyperlipidemia, unspecified: Secondary | ICD-10-CM

## 2023-02-16 MED ORDER — ROSUVASTATIN CALCIUM 10 MG PO TABS: 10.0000 mg | ORAL_TABLET | Freq: Every day | ORAL | 1 refills | Status: DC

## 2023-02-16 MED ORDER — PANTOPRAZOLE SODIUM 40 MG PO TBEC: 40.0000 mg | DELAYED_RELEASE_TABLET | Freq: Every day | ORAL | 1 refills | Status: DC

## 2023-02-19 ENCOUNTER — Ambulatory Visit: Payer: PPO | Admitting: Nurse Practitioner

## 2023-02-24 ENCOUNTER — Telehealth: Payer: Self-pay | Admitting: Internal Medicine

## 2023-02-24 NOTE — Telephone Encounter (Signed)
Megan Anderson from [facility] called to request a prior authorization for patient for Accrufer. Best callback for her is [phone]. 

## 2023-03-04 ENCOUNTER — Other Ambulatory Visit: Payer: Self-pay

## 2023-03-04 DIAGNOSIS — D508 Other iron deficiency anemias: Secondary | ICD-10-CM

## 2023-03-04 DIAGNOSIS — D509 Iron deficiency anemia, unspecified: Secondary | ICD-10-CM

## 2023-03-05 ENCOUNTER — Inpatient Hospital Stay: Payer: PPO

## 2023-03-05 ENCOUNTER — Inpatient Hospital Stay: Payer: PPO | Attending: Internal Medicine | Admitting: Internal Medicine

## 2023-03-06 ENCOUNTER — Telehealth: Payer: Self-pay

## 2023-03-06 ENCOUNTER — Other Ambulatory Visit (HOSPITAL_COMMUNITY): Payer: Self-pay

## 2023-03-06 NOTE — Telephone Encounter (Signed)
Pharmacy Patient Advocate Encounter   Received notification that prior authorization for Accrufer 30mg  is required/requested.  Per Test Claim: non-formulary drug   PA submitted on 03/06/23 to (ins) [employer] via telephone at [phone] Request ID# [id number] Status is pending

## 2023-03-09 ENCOUNTER — Telehealth: Payer: Self-pay | Admitting: Internal Medicine

## 2023-03-09 NOTE — Telephone Encounter (Signed)
New encounter created for PA. Will update encounter once determination is received.

## 2023-03-09 NOTE — Telephone Encounter (Signed)
Contacted [name] to schedule their annual wellness visit. Appointment made for 03/12/2023.  [facility] Care Guide [place] [facility] TEAM Direct Dial: [phone]

## 2023-03-10 NOTE — Telephone Encounter (Signed)
Error

## 2023-03-12 ENCOUNTER — Ambulatory Visit (INDEPENDENT_AMBULATORY_CARE_PROVIDER_SITE_OTHER): Payer: PPO

## 2023-03-12 ENCOUNTER — Ambulatory Visit
Admission: RE | Admit: 2023-03-12 | Discharge: 2023-03-12 | Disposition: A | Discharge status: Home / Self Care | Payer: PPO | Source: Ambulatory Visit | Attending: Internal Medicine | Admitting: Internal Medicine

## 2023-03-12 ENCOUNTER — Other Ambulatory Visit: Payer: Self-pay | Admitting: Internal Medicine

## 2023-03-12 VITALS — Ht 74.0 in | Wt 224.0 lb

## 2023-03-12 DIAGNOSIS — Z0000 Encounter for general adult medical examination without abnormal findings: Secondary | ICD-10-CM

## 2023-03-12 DIAGNOSIS — J439 Emphysema, unspecified: Secondary | ICD-10-CM | POA: Diagnosis not present

## 2023-03-12 DIAGNOSIS — I77810 Thoracic aortic ectasia: Secondary | ICD-10-CM | POA: Diagnosis not present

## 2023-03-12 DIAGNOSIS — I700 Atherosclerosis of aorta: Secondary | ICD-10-CM | POA: Diagnosis not present

## 2023-03-12 DIAGNOSIS — I7121 Aneurysm of the ascending aorta, without rupture: Secondary | ICD-10-CM

## 2023-03-12 MED ORDER — IOPAMIDOL (ISOVUE-370) INJECTION 76%
75.0000 mL | Freq: Once | INTRAVENOUS | Status: AC | PRN
  Administered 2023-03-12: 75 mL via INTRAVENOUS

## 2023-03-12 NOTE — Patient Instructions (Addendum)
[name] , Thank you for taking time to come for your Medicare Wellness Visit. I appreciate your ongoing commitment to your health goals. Please review the following plan we discussed and let me know if I can assist you in the future.   These are the goals we discussed:  Goals      My goal for 2024 is to stay alive by maintaining my health and staying active.        This is a list of the screening recommended for you and due dates:  Health Maintenance  Topic Date Due   Colon Cancer Screening  02/04/2022   COVID-19 Vaccine (6 - 2023-24 season) 07/11/2022   Flu Shot  06/11/2023   Medicare Annual Wellness Visit  03/11/2024   DTaP/Tdap/Td vaccine (3 - Td or Tdap) 03/07/2030   Pneumonia Vaccine  Completed   Hepatitis C Screening: USPSTF Recommendation to screen - Ages 68-79 yo.  Completed   Zoster (Shingles) Vaccine  Completed   HPV Vaccine  Aged Out    Advanced directives: Yes; Please bring a copy of your health care power of attorney and living will to the office at your convenience.  Conditions/risks identified: Yes  Next appointment: Follow up in one year for your annual wellness visit.   Preventive Care 104 Years and Older, Male Preventive care refers to lifestyle choices and visits with your health care provider that can promote health and wellness. What does preventive care include? A yearly physical exam. This is also called an annual well check. Dental exams once or twice a year. Routine eye exams. Ask your health care provider how often you should have your eyes checked. Personal lifestyle choices, including: Daily care of your teeth and gums. Regular physical activity. Eating a healthy diet. Avoiding tobacco and drug use. Limiting alcohol use. Practicing safe sex. Taking low-dose aspirin every day. Taking vitamin and mineral supplements as recommended by your health care provider. What happens during an annual well check? The services and screenings done by your  health care provider during your annual well check will depend on your age, overall health, lifestyle risk factors, and family history of disease. Counseling  Your health care provider may ask you questions about your: Alcohol use. Tobacco use. Drug use. Emotional well-being. Home and relationship well-being. Sexual activity. Eating habits. History of falls. Memory and ability to understand (cognition). Work and work Astronomer. Reproductive health. Screening  You may have the following tests or measurements: Height, weight, and BMI. Blood pressure. Lipid and cholesterol levels. These may be checked every 5 years, or more frequently if you are over 60 years old. Skin check. Lung cancer screening. You may have this screening every year starting at age 25 if you have a 30-pack-year history of smoking and currently smoke or have quit within the past 15 years. Fecal occult blood test (FOBT) of the stool. You may have this test every year starting at age 22. Flexible sigmoidoscopy or colonoscopy. You may have a sigmoidoscopy every 5 years or a colonoscopy every 10 years starting at age 34. Hepatitis C blood test. Hepatitis B blood test. Sexually transmitted disease (STD) testing. Diabetes screening. This is done by checking your blood sugar (glucose) after you have not eaten for a while (fasting). You may have this done every 1-3 years. Bone density scan. This is done to screen for osteoporosis. You may have this done starting at age 60. Mammogram. This may be done every 1-2 years. Talk to your health care provider  about how often you should have regular mammograms. Talk with your health care provider about your test results, treatment options, and if necessary, the need for more tests. Vaccines  Your health care provider may recommend certain vaccines, such as: Influenza vaccine. This is recommended every year. Tetanus, diphtheria, and acellular pertussis (Tdap, Td) vaccine. You may  need a Td booster every 10 years. Zoster vaccine. You may need this after age 68. Pneumococcal 13-valent conjugate (PCV13) vaccine. One dose is recommended after age 31. Pneumococcal polysaccharide (PPSV23) vaccine. One dose is recommended after age 64. Talk to your health care provider about which screenings and vaccines you need and how often you need them. This information is not intended to replace advice given to you by your health care provider. Make sure you discuss any questions you have with your health care provider. Document Released: 11/23/2015 Document Revised: 07/16/2016 Document Reviewed: 08/28/2015 Elsevier Interactive Patient Education  2017 [place] Prevention in the Home Falls can cause injuries. They can happen to people of all ages. There are many things you can do to make your home safe and to help prevent falls. What can I do on the outside of my home? Regularly fix the edges of walkways and driveways and fix any cracks. Remove anything that might make you trip as you walk through a door, such as a raised step or threshold. Trim any bushes or trees on the path to your home. Use bright outdoor lighting. Clear any walking paths of anything that might make someone trip, such as rocks or tools. Regularly check to see if handrails are loose or broken. Make sure that both sides of any steps have handrails. Any raised decks and porches should have guardrails on the edges. Have any leaves, snow, or ice cleared regularly. Use sand or salt on walking paths during winter. Clean up any spills in your garage right away. This includes oil or grease spills. What can I do in the bathroom? Use night lights. Install grab bars by the toilet and in the tub and shower. Do not use towel bars as grab bars. Use non-skid mats or decals in the tub or shower. If you need to sit down in the shower, use a plastic, non-slip stool. Keep the floor dry. Clean up any water that spills on  the floor as soon as it happens. Remove soap buildup in the tub or shower regularly. Attach bath mats securely with double-sided non-slip rug tape. Do not have throw rugs and other things on the floor that can make you trip. What can I do in the bedroom? Use night lights. Make sure that you have a light by your bed that is easy to reach. Do not use any sheets or blankets that are too big for your bed. They should not hang down onto the floor. Have a firm chair that has side arms. You can use this for support while you get dressed. Do not have throw rugs and other things on the floor that can make you trip. What can I do in the kitchen? Clean up any spills right away. Avoid walking on wet floors. Keep items that you use a lot in easy-to-reach places. If you need to reach something above you, use a strong step stool that has a grab bar. Keep electrical cords out of the way. Do not use floor polish or wax that makes floors slippery. If you must use wax, use non-skid floor wax. Do not have throw rugs and  other things on the floor that can make you trip. What can I do with my stairs? Do not leave any items on the stairs. Make sure that there are handrails on both sides of the stairs and use them. Fix handrails that are broken or loose. Make sure that handrails are as long as the stairways. Check any carpeting to make sure that it is firmly attached to the stairs. Fix any carpet that is loose or worn. Avoid having throw rugs at the top or bottom of the stairs. If you do have throw rugs, attach them to the floor with carpet tape. Make sure that you have a light switch at the top of the stairs and the bottom of the stairs. If you do not have them, ask someone to add them for you. What else can I do to help prevent falls? Wear shoes that: Do not have high heels. Have rubber bottoms. Are comfortable and fit you well. Are closed at the toe. Do not wear sandals. If you use a stepladder: Make sure  that it is fully opened. Do not climb a closed stepladder. Make sure that both sides of the stepladder are locked into place. Ask someone to hold it for you, if possible. Clearly mark and make sure that you can see: Any grab bars or handrails. First and last steps. Where the edge of each step is. Use tools that help you move around (mobility aids) if they are needed. These include: Canes. Walkers. Scooters. Crutches. Turn on the lights when you go into a dark area. Replace any light bulbs as soon as they burn out. Set up your furniture so you have a clear path. Avoid moving your furniture around. If any of your floors are uneven, fix them. If there are any pets around you, be aware of where they are. Review your medicines with your doctor. Some medicines can make you feel dizzy. This can increase your chance of falling. Ask your doctor what other things that you can do to help prevent falls. This information is not intended to replace advice given to you by your health care provider. Make sure you discuss any questions you have with your health care provider. Document Released: 08/23/2009 Document Revised: 04/03/2016 Document Reviewed: 12/01/2014 Elsevier Interactive Patient Education  2017 [employer].

## 2023-03-12 NOTE — Progress Notes (Signed)
I connected with  [name] [name] on 03/12/23 by a audio enabled telemedicine application and verified that I am speaking with the correct person using two identifiers.  Patient Location: Home  Provider Location: Office/Clinic  I discussed the limitations of evaluation and management by telemedicine. The patient expressed understanding and agreed to proceed.  Patient Medicare AWV questionnaire was completed by the patient on 03/09/2023; I have confirmed that all information answered by patient is correct and no changes since this date.    Subjective:   [name] [name] is a 67 y.o. male who presents for Medicare Annual/Subsequent preventive examination.  Review of Systems     Cardiac Risk Factors include: advanced age (>33men, >58 women);dyslipidemia;hypertension;male gender;family history of premature cardiovascular disease     Objective:    Today's Vitals   03/12/23 0856 03/12/23 0900  Weight: 224 lb (101.6 kg)   Height: 6\' 2"  (1.88 m)   PainSc: 0-No pain 0-No pain   Body mass index is 28.76 kg/m.     03/12/2023    9:05 AM 03/24/2022    3:49 PM 11/02/2020    8:21 AM 10/30/2020    5:00 PM 10/30/2020   10:43 AM 10/18/2020    8:19 AM 02/03/2017    7:58 AM  Advanced Directives  Does Patient Have a Medical Advance Directive? Yes Yes No Yes Yes No No  Type of Estate agent of [place];Living will Living will;Healthcare Power of Attorney  Living will Living will    Does patient want to make changes to medical advance directive?  No - Patient declined No - Patient declined No - Patient declined No - Patient declined    Copy of Healthcare Power of Attorney in Chart? No - copy requested No - copy requested       Would patient like information on creating a medical advance directive?   No - Patient declined        Current Medications (verified) Outpatient Encounter Medications as of 03/12/2023  Medication Sig   acetaminophen (TYLENOL) 650 MG CR tablet Take 650 mg  by mouth every 8 (eight) hours as needed for pain.   aspirin EC 81 MG tablet Take 1 tablet (81 mg total) by mouth daily. Swallow whole.   cholecalciferol (VITAMIN D3) 25 MCG (1000 UNIT) tablet Take 1,000 Units by mouth daily.   Multiple Vitamin (MULTIVITAMIN) tablet Take 1 tablet by mouth daily.   olmesartan (BENICAR) 20 MG tablet TAKE 1 TABLET BY MOUTH EVERY DAY   pantoprazole (PROTONIX) 40 MG tablet Take 1 tablet (40 mg total) by mouth daily.   rosuvastatin (CRESTOR) 10 MG tablet Take 1 tablet (10 mg total) by mouth daily.   tadalafil (CIALIS) 20 MG tablet Take 1 tablet (20 mg total) by mouth daily as needed for erectile dysfunction.   traMADol (ULTRAM) 50 MG tablet TAKE 1 TABLET BY MOUTH EVERY 8 HOURS AS NEEDED   Turmeric 450 MG CAPS Take 1 capsule by mouth 2 (two) times daily with a meal.   zinc gluconate ([employer] ZINC GLUCONATE) 50 MG tablet Take 1 tablet (50 mg total) by mouth daily.   zolpidem (AMBIEN CR) 12.5 MG CR tablet Take 1 tablet (12.5 mg total) by mouth at bedtime as needed for sleep.   No facility-administered encounter medications on file as of 03/12/2023.    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   Past Surgical History:  Procedure Laterality Date   Arthroscopy X 1 left ([name])     X 2 right   COLONOSCOPY     correction of deviated septum     DECORTICATION Left 10/26/2020   Procedure: DECORTICATION OF LEFT LUNG, APPLICATION OF ON Q-PUMP FOR PAIN MANAGEMENT;  Surgeon: Kerin Perna, MD;  Location: [facility] OR;  Service: Thoracic;  Laterality: Left;   GANGLION CYST EXCISION     right wrist (kuzma)   GASTRIC BANDING PORT REVISION  8/11   PILONIDAL CYST / SINUS EXCISION  67 yrs  old   TONSILLECTOMY     ULNAR COLLATERAL LIGAMENT REPAIR Right 09/21/2013   Procedure: ULNAR COLLATERAL LIGAMENT REPAIR METACARPAL PHALANGE RIGHT THUMB POSSIBLE APL GRAFT RECONSTRUCTION;  Surgeon: Nicki Reaper, MD;  Location: [facility];  Service: Orthopedics;  Laterality: Right;   VIDEO ASSISTED THORACOSCOPY (VATS)/EMPYEMA Left 10/26/2020   Procedure: VIDEO ASSISTED THORACOSCOPY (VATS)/EMPYEMA ,INSERTION OF LEFT PLEURAL CHEST TUBES X 3;  Surgeon: Kerin Perna, MD;  Location: [facility] OR;  Service: Thoracic;  Laterality: Left;   Family History  Problem Relation Age of Onset   Cancer Father        lung cancer metatstatic cancer to brain   Coronary artery disease Father    Heart attack Father        X 2 (41,45)   Heart disease Father        CAD/MI x 2   Cancer Mother        rapid on set   Hypertension Mother    Parkinsonism Maternal Grandfather    Diabetes Neg Hx    Colon cancer Neg Hx    Colon polyps Neg Hx    Esophageal cancer Neg Hx    Rectal cancer Neg Hx    Stomach cancer Neg Hx    Social History   Socioeconomic History   Marital status: Married    Spouse name: Not on file   Number of children: 2   Years of education: 12   Highest education level: Not on file  Occupational History   Occupation: Trucking     Employer: [employer]  Tobacco Use   Smoking status: Former    Types: Cigarettes    Quit date: 06/13/1995    Years since quitting: 27.7    Passive exposure: Past   Smokeless tobacco: Never  Vaping Use   Vaping Use: Never used  Substance and Sexual Activity   Alcohol use: No    Alcohol/week: 0.0 standard drinks of alcohol   Drug use: No   Sexual activity: Yes    Partners: Female  Other Topics Concern   Not on file  Social History Narrative   HSG, [employer]ice [employer] - BA business. Married '76  2 sons - '77, '80. Marriage in good health. 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    Social Determinants of Health   Financial  Resource Strain: Low Risk  (03/12/2023)   Overall Financial Resource Strain (CARDIA)    Difficulty of Paying Living Expenses: Not very hard  Food Insecurity: No Food Insecurity (03/12/2023)   Hunger Vital Sign    Worried About Running Out of Food in the Last Year: Never true    Ran  Out of Food in the Last Year: Never true  Transportation Needs: No Transportation Needs (03/12/2023)   PRAPARE - Administrator, Civil Service (Medical): No    Lack of Transportation (Non-Medical): No  Physical Activity: Sufficiently Active (03/12/2023)   Exercise Vital Sign    Days of Exercise per Week: 5 days    Minutes of Exercise per Session: 60 min  Stress: No Stress Concern Present (03/12/2023)   [employer] of Occupational Health - Occupational Stress Questionnaire    Feeling of Stress : Not at all  Social Connections: Unknown (03/12/2023)   Social Connection and Isolation Panel [NHANES]    Frequency of Communication with Friends and Family: More than three times a week    Frequency of Social Gatherings with Friends and Family: More than three times a week    Attends Religious Services: Patient unable to answer    Active Member of Clubs or Organizations: Yes    Attends Banker Meetings: 1 to 4 times per year    Marital Status: Married    Tobacco Counseling Counseling given: Not Answered   Clinical Intake:  Pre-visit preparation completed: Yes  Pain : No/denies pain Pain Score: 0-No pain     BMI - recorded: 28.76 Nutritional Status: BMI 25 -29 Overweight Nutritional Risks: None Diabetes: No  How often do you need to have someone help you when you read instructions, pamphlets, or other written materials from your doctor or pharmacy?: 1 - Never What is the last grade level you completed in school?: College Graduate  Diabetic? No  Interpreter Needed?: No  Information entered by :: Susie Cassette, LPN.   Activities of Daily Living    03/12/2023    9:06 AM  03/09/2023    1:11 PM  In your present state of health, do you have any difficulty performing the following activities:  Hearing? 0 0  Vision? 0 0  Difficulty concentrating or making decisions? 0 0  Walking or climbing stairs? 0 0  Dressing or bathing? 0 0  Doing errands, shopping? 0 0  Preparing Food and eating ? N N  Using the Toilet? N N  In the past six months, have you accidently leaked urine? N N  Do you have problems with loss of bowel control? N N  Managing your Medications? N N  Managing your Finances? N N  Housekeeping or managing your Housekeeping? N N    Patient Care Team: Etta Grandchild, MD as PCP - General (Internal Medicine) Maisie Fus, MD as PCP - Cardiology (Cardiology) Nelson Chimes, MD as Attending Physician (Ophthalmology)  Indicate any recent Medical Services you may have received from other than [facility] providers in the past year (date may be approximate).     Assessment:   This is a routine wellness examination for [place].  Hearing/Vision screen Hearing Screening - Comments:: Denies hearing difficulties   Vision Screening - Comments:: Wears readers only - up to date with routine eye exams with [facility]   Dietary issues and exercise activities discussed: Current Exercise Habits: Home exercise routine, Type of exercise: walking;treadmill;stretching;strength training/weights, Time (Minutes): 60, Frequency (Times/Week): 5, Weekly Exercise (Minutes/Week): 300, Intensity: Moderate, Exercise limited by: None identified   Goals Addressed             This Visit's Progress    My goal for 2024 is to stay alive by maintaining my health and staying active.        Depression Screen  03/12/2023    9:06 AM 03/24/2022    3:53 PM 06/13/2021    4:10 PM 02/23/2020    4:11 PM 12/09/2018    1:31 PM 09/24/2017    1:24 PM  PHQ 2/9 Scores  PHQ - 2 Score 0 0 0 0 0 1  PHQ- 9 Score 0         Fall Risk    03/12/2023    9:06 AM 03/09/2023    1:11 PM  03/24/2022    3:50 PM 06/13/2021    4:10 PM 02/23/2020    4:11 PM  Fall Risk   Falls in the past year? 0 0 0 0 0  Number falls in past yr: 0  0  0  Injury with Fall? 0 0 0  0  Risk for fall due to : No Fall Risks  No Fall Risks  No Fall Risks  Follow up Falls prevention discussed  Falls evaluation completed  Falls evaluation completed    FALL RISK PREVENTION PERTAINING TO THE HOME:  Any stairs in or around the home? No  If so, are there any without handrails? No  Home free of loose throw rugs in walkways, pet beds, electrical cords, etc? Yes  Adequate lighting in your home to reduce risk of falls? Yes   ASSISTIVE DEVICES UTILIZED TO PREVENT FALLS:  Life alert? No  Use of a cane, walker or w/c? No  Grab bars in the bathroom? Yes  Shower chair or bench in shower? Yes  Elevated toilet seat or a handicapped toilet? No   TIMED UP AND GO:  Was the test performed? No . Telephonic Visit   Cognitive Function:        03/12/2023    9:07 AM 03/24/2022    4:08 PM  6CIT Screen  What Year? 0 points 0 points  What month? 0 points 0 points  What time? 0 points 0 points  Count back from 20 0 points 0 points  Months in reverse 0 points 0 points  Repeat phrase 0 points 0 points  Total Score 0 points 0 points    Immunizations Immunization History  Administered Date(s) Administered   Fluad Quad(high Dose 65+) 07/24/2019, 08/02/2022   Hepatitis A, Adult 03/07/2020, 09/06/2020   Hepb-cpg 03/07/2020, 04/06/2020   Influenza Whole 08/06/2009, 09/02/2010, 09/08/2012   Influenza, High Dose Seasonal PF 07/17/2020   Influenza,inj,Quad PF,6+ Mos 08/06/2014, 07/31/2017, 09/22/2018, 08/11/2019   Influenza-Unspecified 06/29/2015, 07/17/2020, 08/12/2021   PFIZER(Purple Top)SARS-COV-2 Vaccination 01/19/2020, 02/13/2020, 08/14/2020, 04/06/2021   PNEUMOCOCCAL CONJUGATE-20 08/02/2022   Pfizer Covid-19 Vaccine Bivalent Booster 41yrs & up 08/12/2021   Pneumococcal Conjugate-13 11/13/2020   Td  09/02/2010   Tdap 03/07/2020   Zoster Recombinat (Shingrix) 12/09/2018, 07/24/2019   Zoster, Live 07/27/2015    TDAP status: Up to date  Flu Vaccine status: Up to date  Pneumococcal vaccine status: Up to date  Covid-19 vaccine status: Completed vaccines  Qualifies for Shingles Vaccine? Yes   Zostavax completed No   Shingrix Completed?: Yes  Screening Tests Health Maintenance  Topic Date Due   COLONOSCOPY (Pts 45-48yrs Insurance coverage will need to be confirmed)  02/04/2022   COVID-19 Vaccine (6 - 2023-24 season) 07/11/2022   INFLUENZA VACCINE  06/11/2023   Medicare Annual Wellness (AWV)  03/11/2024   DTaP/Tdap/Td (3 - Td or Tdap) 03/07/2030   Pneumonia Vaccine 84+ Years old  Completed   Hepatitis C Screening  Completed   Zoster Vaccines- Shingrix  Completed   HPV VACCINES  Aged Out  Health Maintenance  Health Maintenance Due  Topic Date Due   COLONOSCOPY (Pts 45-57yrs Insurance coverage will need to be confirmed)  02/04/2022   COVID-19 Vaccine (6 - 2023-24 season) 07/11/2022    Colorectal cancer screening: Type of screening: Colonoscopy. Completed 02/04/2017. Repeat every 5 years Scheduled to see GI on 04/03/2023  Lung Cancer Screening: (Low Dose CT Chest recommended if Age 62-80 years, 30 pack-year currently smoking OR have quit w/in 15years.) does not qualify.   Lung Cancer Screening Referral: NO  Additional Screening:  Hepatitis C Screening: does qualify; Completed 03/01/2020  Vision Screening: Recommended annual ophthalmology exams for early detection of glaucoma and other disorders of the eye. Is the patient up to date with their annual eye exam?  Yes  Who is the provider or what is the name of the office in which the patient attends annual eye exams? [facility] If pt is not established with a provider, would they like to be referred to a provider to establish care? No .   Dental Screening: Recommended annual dental exams for proper oral  hygiene  Community Resource Referral / Chronic Care Management: CRR required this visit?  No   CCM required this visit?  No      Plan:     I have personally reviewed and noted the following in the patient's chart:   Medical and social history Use of alcohol, tobacco or illicit drugs  Current medications and supplements including opioid prescriptions. Patient is not currently taking opioid prescriptions. Functional ability and status Nutritional status Physical activity Advanced directives List of other physicians Hospitalizations, surgeries, and ER visits in previous 12 months Vitals Screenings to include cognitive, depression, and falls Referrals and appointments  In addition, I have reviewed and discussed with patient certain preventive protocols, quality metrics, and best practice recommendations. A written personalized care plan for preventive services as well as general preventive health recommendations were provided to patient.     Mickeal Needy, LPN   6/0/4540   Nurse Notes:  Normal cognitive status assessed by direct observation via phone conversation by this Nurse Health Advisor. No abnormalities found.

## 2023-03-17 ENCOUNTER — Other Ambulatory Visit: Payer: Self-pay | Admitting: Internal Medicine

## 2023-03-17 NOTE — Telephone Encounter (Signed)
Pharmacy Patient Advocate Encounter  Received notification from [employer] that the request for prior authorization for Accrufer 30mg  has been denied due to the druge is not a covered benefit under Medicare Part D. We have denied your requrest as the medication is not from a manufacturere who has signed an agreement with Centers for Medicare and Medicaid Services (CMS) to offer discounts on all their applicable drugs to eligible Medicare beneficiaries during the coverage gap benefit phase.     Please be advised we currently do not have a Pharmacist to review denials, therefore you will need to process appeals accordingly as needed. Thanks for your support at this time.   You may call [phone] or fax [phone], to appeal.   Denial letter attached to chart.

## 2023-04-05 ENCOUNTER — Other Ambulatory Visit: Payer: Self-pay | Admitting: Internal Medicine

## 2023-04-05 DIAGNOSIS — F5101 Primary insomnia: Secondary | ICD-10-CM

## 2023-04-29 ENCOUNTER — Telehealth: Payer: Self-pay | Admitting: Internal Medicine

## 2023-04-29 NOTE — Telephone Encounter (Signed)
Mega Anderson from [facility] wanted to leave her call back number if anyone have any question about the PA submission. [phone]

## 2023-05-04 ENCOUNTER — Other Ambulatory Visit: Payer: Self-pay | Admitting: Internal Medicine

## 2023-05-04 DIAGNOSIS — I10 Essential (primary) hypertension: Secondary | ICD-10-CM

## 2023-05-06 NOTE — Progress Notes (Signed)
Medical screening examination/treatment/procedure(s) were performed by non-physician practitioner and as supervising physician I was immediately available for consultation/collaboration.  I agree with above. [name] Linger, MD      I connected with  [name] [name] on 05/06/23 by a audio enabled telemedicine application and verified that I am speaking with the correct person using two identifiers.  Patient Location: Home  Provider Location: Office/Clinic  I discussed the limitations of evaluation and management by telemedicine. The patient expressed understanding and agreed to proceed.  Patient Medicare AWV questionnaire was completed by the patient on 03/09/2023; I have confirmed that all information answered by patient is correct and no changes since this date.    Subjective:   [name] [name] is a 67 y.o. male who presents for Medicare Annual/Subsequent preventive examination.  Review of Systems     Cardiac Risk Factors include: advanced age (>29men, >44 women);dyslipidemia;hypertension;male gender;family history of premature cardiovascular disease     Objective:    Today's Vitals   03/12/23 0856 03/12/23 0900  Weight: 224 lb (101.6 kg)   Height: 6\' 2"  (1.88 m)   PainSc: 0-No pain 0-No pain   Body mass index is 28.76 kg/m.     03/12/2023    9:05 AM 03/24/2022    3:49 PM 11/02/2020    8:21 AM 10/30/2020    5:00 PM 10/30/2020   10:43 AM 10/18/2020    8:19 AM 02/03/2017    7:58 AM  Advanced Directives  Does Patient Have a Medical Advance Directive? Yes Yes No Yes Yes No No  Type of Estate agent of [place];Living will Living will;Healthcare Power of Attorney  Living will Living will    Does patient want to make changes to medical advance directive?  No - Patient declined No - Patient declined No - Patient declined No - Patient declined    Copy of Healthcare Power of Attorney in Chart? No - copy requested No - copy requested       Would patient like  information on creating a medical advance directive?   No - Patient declined        Current Medications (verified) Outpatient Encounter Medications as of 03/12/2023  Medication Sig   acetaminophen (TYLENOL) 650 MG CR tablet Take 650 mg by mouth every 8 (eight) hours as needed for pain.   aspirin EC 81 MG tablet Take 1 tablet (81 mg total) by mouth daily. Swallow whole.   cholecalciferol (VITAMIN D3) 25 MCG (1000 UNIT) tablet Take 1,000 Units by mouth daily.   Multiple Vitamin (MULTIVITAMIN) tablet Take 1 tablet by mouth daily.   pantoprazole (PROTONIX) 40 MG tablet Take 1 tablet (40 mg total) by mouth daily.   rosuvastatin (CRESTOR) 10 MG tablet Take 1 tablet (10 mg total) by mouth daily.   tadalafil (CIALIS) 20 MG tablet Take 1 tablet (20 mg total) by mouth daily as needed for erectile dysfunction.   traMADol (ULTRAM) 50 MG tablet TAKE 1 TABLET BY MOUTH EVERY 8 HOURS AS NEEDED   Turmeric 450 MG CAPS Take 1 capsule by mouth 2 (two) times daily with a meal.   zinc gluconate ([facility] ZINC GLUCONATE) 50 MG tablet Take 1 tablet (50 mg total) by mouth daily.   [DISCONTINUED] olmesartan (BENICAR) 20 MG tablet TAKE 1 TABLET BY MOUTH EVERY DAY   [DISCONTINUED] zolpidem (AMBIEN CR) 12.5 MG CR tablet Take 1 tablet (12.5 mg total) by mouth at bedtime as needed for sleep.   No facility-administered encounter medications on file as of  03/12/2023.    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   Past Surgical History:  Procedure Laterality Date   Arthroscopy X 1 left ([name])     X 2 right   COLONOSCOPY     correction of deviated septum     DECORTICATION Left 10/26/2020   Procedure: DECORTICATION OF LEFT LUNG,  APPLICATION OF ON Q-PUMP FOR PAIN MANAGEMENT;  Surgeon: Kerin Perna, MD;  Location: [facility] OR;  Service: Thoracic;  Laterality: Left;   GANGLION CYST EXCISION     right wrist (kuzma)   GASTRIC BANDING PORT REVISION  8/11   PILONIDAL CYST / SINUS EXCISION  67 yrs old   TONSILLECTOMY     ULNAR COLLATERAL LIGAMENT REPAIR Right 09/21/2013   Procedure: ULNAR COLLATERAL LIGAMENT REPAIR METACARPAL PHALANGE RIGHT THUMB POSSIBLE APL GRAFT RECONSTRUCTION;  Surgeon: Nicki Reaper, MD;  Location: [facility];  Service: Orthopedics;  Laterality: Right;   VIDEO ASSISTED THORACOSCOPY (VATS)/EMPYEMA Left 10/26/2020   Procedure: VIDEO ASSISTED THORACOSCOPY (VATS)/EMPYEMA ,INSERTION OF LEFT PLEURAL CHEST TUBES X 3;  Surgeon: Kerin Perna, MD;  Location: [facility] OR;  Service: Thoracic;  Laterality: Left;   Family History  Problem Relation Age of Onset   Cancer Father        lung cancer metatstatic cancer to brain   Coronary artery disease Father    Heart attack Father        X 2 (41,45)   Heart disease Father        CAD/MI x 2   Cancer Mother        rapid on set   Hypertension Mother    Parkinsonism Maternal Grandfather    Diabetes Neg Hx    Colon cancer Neg Hx    Colon polyps Neg Hx    Esophageal cancer Neg Hx    Rectal cancer Neg Hx    Stomach cancer Neg Hx    Social History   Socioeconomic History   Marital status: Married    Spouse name: Not on file   Number of children: 2   Years of education: 12   Highest education level: Not on file  Occupational History   Occupation: Trucking     Employer: [employer]  Tobacco Use   Smoking status: Former    Types: Cigarettes    Quit date: 06/13/1995    Years since quitting: 27.9    Passive exposure: Past   Smokeless tobacco: Never  Vaping Use   Vaping Use: Never used  Substance and Sexual Activity   Alcohol use: No    Alcohol/week: 0.0 standard drinks of alcohol   Drug use: No   Sexual activity: Yes    Partners: Female   Other Topics Concern   Not on file  Social History Narrative   HSG, [employer]ice [employer] - BA business. Married '76  2 sons - '77, '80. Marriage in good health. 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    Social Determinants of Health   Financial Resource Strain: Low Risk  (03/12/2023)   Overall Financial Resource Strain (CARDIA)    Difficulty of Paying Living Expenses: Not  very hard  Food Insecurity: No Food Insecurity (03/12/2023)   Hunger Vital Sign    Worried About Running Out of Food in the Last Year: Never true    Ran Out of Food in the Last Year: Never true  Transportation Needs: No Transportation Needs (03/12/2023)   PRAPARE - Administrator, Civil Service (Medical): No    Lack of Transportation (Non-Medical): No  Physical Activity: Sufficiently Active (03/12/2023)   Exercise Vital Sign    Days of Exercise per Week: 5 days    Minutes of Exercise per Session: 60 min  Stress: No Stress Concern Present (03/12/2023)   [employer] of Occupational Health - Occupational Stress Questionnaire    Feeling of Stress : Not at all  Social Connections: Unknown (03/12/2023)   Social Connection and Isolation Panel [NHANES]    Frequency of Communication with Friends and Family: More than three times a week    Frequency of Social Gatherings with Friends and Family: More than three times a week    Attends Religious Services: Patient unable to answer    Active Member of Clubs or Organizations: Yes    Attends Banker Meetings: 1 to 4 times per year    Marital Status: Married    Tobacco Counseling Counseling given: Not Answered   Clinical Intake:  Pre-visit preparation completed: Yes  Pain : No/denies pain Pain Score: 0-No pain     BMI - recorded: 28.76 Nutritional Status: BMI 25 -29 Overweight Nutritional Risks: None Diabetes: No  How often do you need to have someone help you when you read instructions, pamphlets, or other written materials  from your doctor or pharmacy?: 1 - Never What is the last grade level you completed in school?: College Graduate  Diabetic? No  Interpreter Needed?: No  Information entered by :: Susie Cassette, LPN.   Activities of Daily Living    03/12/2023    9:06 AM 03/09/2023    1:11 PM  In your present state of health, do you have any difficulty performing the following activities:  Hearing? 0 0  Vision? 0 0  Difficulty concentrating or making decisions? 0 0  Walking or climbing stairs? 0 0  Dressing or bathing? 0 0  Doing errands, shopping? 0 0  Preparing Food and eating ? N N  Using the Toilet? N N  In the past six months, have you accidently leaked urine? N N  Do you have problems with loss of bowel control? N N  Managing your Medications? N N  Managing your Finances? N N  Housekeeping or managing your Housekeeping? N N    Patient Care Team: Etta Grandchild, MD as PCP - General (Internal Medicine) Maisie Fus, MD as PCP - Cardiology (Cardiology) Nelson Chimes, MD as Attending Physician (Ophthalmology)  Indicate any recent Medical Services you may have received from other than [facility] providers in the past year (date may be approximate).     Assessment:   This is a routine wellness examination for [place].  Hearing/Vision screen Hearing Screening - Comments:: Denies hearing difficulties   Vision Screening - Comments:: Wears readers only - up to date with routine eye exams with [facility]   Dietary issues and exercise activities discussed: Current Exercise Habits: Home exercise routine, Type of exercise: walking;treadmill;stretching;strength training/weights, Time (Minutes): 60, Frequency (Times/Week): 5, Weekly Exercise (Minutes/Week): 300, Intensity: Moderate, Exercise limited by: None identified   Goals Addressed  This Visit's Progress    My goal for 2024 is to stay alive by maintaining my health and staying active.        Depression  Screen    03/12/2023    9:06 AM 03/24/2022    3:53 PM 06/13/2021    4:10 PM 02/23/2020    4:11 PM 12/09/2018    1:31 PM 09/24/2017    1:24 PM  PHQ 2/9 Scores  PHQ - 2 Score 0 0 0 0 0 1  PHQ- 9 Score 0         Fall Risk    03/12/2023    9:06 AM 03/09/2023    1:11 PM 03/24/2022    3:50 PM 06/13/2021    4:10 PM 02/23/2020    4:11 PM  Fall Risk   Falls in the past year? 0 0 0 0 0  Number falls in past yr: 0  0  0  Injury with Fall? 0 0 0  0  Risk for fall due to : No Fall Risks  No Fall Risks  No Fall Risks  Follow up Falls prevention discussed  Falls evaluation completed  Falls evaluation completed    FALL RISK PREVENTION PERTAINING TO THE HOME:  Any stairs in or around the home? No  If so, are there any without handrails? No  Home free of loose throw rugs in walkways, pet beds, electrical cords, etc? Yes  Adequate lighting in your home to reduce risk of falls? Yes   ASSISTIVE DEVICES UTILIZED TO PREVENT FALLS:  Life alert? No  Use of a cane, walker or w/c? No  Grab bars in the bathroom? Yes  Shower chair or bench in shower? Yes  Elevated toilet seat or a handicapped toilet? No   TIMED UP AND GO:  Was the test performed? No . Telephonic Visit   Cognitive Function:        03/12/2023    9:07 AM 03/24/2022    4:08 PM  6CIT Screen  What Year? 0 points 0 points  What month? 0 points 0 points  What time? 0 points 0 points  Count back from 20 0 points 0 points  Months in reverse 0 points 0 points  Repeat phrase 0 points 0 points  Total Score 0 points 0 points    Immunizations Immunization History  Administered Date(s) Administered   Fluad Quad(high Dose 65+) 07/24/2019, 08/02/2022   Hepatitis A, Adult 03/07/2020, 09/06/2020   Hepb-cpg 03/07/2020, 04/06/2020   Influenza Whole 08/06/2009, 09/02/2010, 09/08/2012   Influenza, High Dose Seasonal PF 07/17/2020   Influenza,inj,Quad PF,6+ Mos 08/06/2014, 07/31/2017, 09/22/2018, 08/11/2019   Influenza-Unspecified 06/29/2015,  07/17/2020, 08/12/2021   PFIZER(Purple Top)SARS-COV-2 Vaccination 01/19/2020, 02/13/2020, 08/14/2020, 04/06/2021   PNEUMOCOCCAL CONJUGATE-20 08/02/2022   Pfizer Covid-19 Vaccine Bivalent Booster 60yrs & up 08/12/2021   Pneumococcal Conjugate-13 11/13/2020   Td 09/02/2010   Tdap 03/07/2020   Zoster Recombinat (Shingrix) 12/09/2018, 07/24/2019   Zoster, Live 07/27/2015    TDAP status: Up to date  Flu Vaccine status: Up to date  Pneumococcal vaccine status: Up to date  Covid-19 vaccine status: Completed vaccines  Qualifies for Shingles Vaccine? Yes   Zostavax completed No   Shingrix Completed?: Yes  Screening Tests Health Maintenance  Topic Date Due   Colonoscopy  02/04/2022   COVID-19 Vaccine (6 - 2023-24 season) 07/11/2022   INFLUENZA VACCINE  06/11/2023   Medicare Annual Wellness (AWV)  03/11/2024   DTaP/Tdap/Td (3 - Td or Tdap) 03/07/2030   Pneumonia Vaccine 65+ Years  old  Completed   Hepatitis C Screening  Completed   Zoster Vaccines- Shingrix  Completed   HPV VACCINES  Aged Out    Health Maintenance  Health Maintenance Due  Topic Date Due   Colonoscopy  02/04/2022   COVID-19 Vaccine (6 - 2023-24 season) 07/11/2022    Colorectal cancer screening: Type of screening: Colonoscopy. Completed 02/04/2017. Repeat every 5 years Scheduled to see GI on 04/03/2023  Lung Cancer Screening: (Low Dose CT Chest recommended if Age 8-80 years, 30 pack-year currently smoking OR have quit w/in 15years.) does not qualify.   Lung Cancer Screening Referral: NO  Additional Screening:  Hepatitis C Screening: does qualify; Completed 03/01/2020  Vision Screening: Recommended annual ophthalmology exams for early detection of glaucoma and other disorders of the eye. Is the patient up to date with their annual eye exam?  Yes  Who is the provider or what is the name of the office in which the patient attends annual eye exams? [facility] If pt is not established with a provider,  would they like to be referred to a provider to establish care? No .   Dental Screening: Recommended annual dental exams for proper oral hygiene  Community Resource Referral / Chronic Care Management: CRR required this visit?  No   CCM required this visit?  No      Plan:     I have personally reviewed and noted the following in the patient's chart:   Medical and social history Use of alcohol, tobacco or illicit drugs  Current medications and supplements including opioid prescriptions. Patient is not currently taking opioid prescriptions. Functional ability and status Nutritional status Physical activity Advanced directives List of other physicians Hospitalizations, surgeries, and ER visits in previous 12 months Vitals Screenings to include cognitive, depression, and falls Referrals and appointments  In addition, I have reviewed and discussed with patient certain preventive protocols, quality metrics, and best practice recommendations. A written personalized care plan for preventive services as well as general preventive health recommendations were provided to patient.     [name] Linger, MD   05/06/2023   Nurse Notes:  Normal cognitive status assessed by direct observation via phone conversation by this Nurse Health Advisor. No abnormalities found.

## 2023-07-29 ENCOUNTER — Other Ambulatory Visit: Payer: Self-pay | Admitting: Internal Medicine

## 2023-07-29 DIAGNOSIS — K2101 Gastro-esophageal reflux disease with esophagitis, with bleeding: Secondary | ICD-10-CM

## 2023-08-01 ENCOUNTER — Other Ambulatory Visit: Payer: Self-pay | Admitting: Internal Medicine

## 2023-08-01 DIAGNOSIS — F5101 Primary insomnia: Secondary | ICD-10-CM

## 2023-08-03 ENCOUNTER — Other Ambulatory Visit: Payer: Self-pay | Admitting: Internal Medicine

## 2023-08-03 DIAGNOSIS — F5101 Primary insomnia: Secondary | ICD-10-CM

## 2023-08-09 ENCOUNTER — Other Ambulatory Visit: Payer: Self-pay | Admitting: Internal Medicine

## 2023-08-09 DIAGNOSIS — K2101 Gastro-esophageal reflux disease with esophagitis, with bleeding: Secondary | ICD-10-CM

## 2023-08-25 ENCOUNTER — Telehealth: Payer: Self-pay | Admitting: Internal Medicine

## 2023-08-25 ENCOUNTER — Other Ambulatory Visit: Payer: Self-pay | Admitting: Internal Medicine

## 2023-08-25 NOTE — Telephone Encounter (Signed)
Prescription Request  08/25/2023  LOV: 02/02/2023 Pt asking if he can get a temporary refill.  What is the name of the medication or equipment?  pantoprazole (PROTONIX) 40 MG tablet   zolpidem (AMBIEN CR) 12.5 MG CR tablet  Have you contacted your pharmacy to request a refill? No   Which pharmacy would you like this sent to?   [id number], [place] - [address] [place] [address] Phone: [phone] Fax: [phone]  Patient notified that their request is being sent to the clinical staff for review and that they should receive a response within 2 business days.   Please advise at Mobile [phone] (mobile)

## 2023-08-26 ENCOUNTER — Ambulatory Visit: Payer: PPO | Admitting: Internal Medicine

## 2023-08-26 ENCOUNTER — Encounter: Payer: Self-pay | Admitting: Internal Medicine

## 2023-08-26 VITALS — BP 158/94 | HR 82 | Temp 98.1°F | Resp 16 | Ht 74.0 in | Wt 240.0 lb

## 2023-08-26 DIAGNOSIS — K2100 Gastro-esophageal reflux disease with esophagitis, without bleeding: Secondary | ICD-10-CM

## 2023-08-26 DIAGNOSIS — Z23 Encounter for immunization: Secondary | ICD-10-CM | POA: Diagnosis not present

## 2023-08-26 DIAGNOSIS — E60 Dietary zinc deficiency: Secondary | ICD-10-CM

## 2023-08-26 DIAGNOSIS — I10 Essential (primary) hypertension: Secondary | ICD-10-CM

## 2023-08-26 DIAGNOSIS — Z1211 Encounter for screening for malignant neoplasm of colon: Secondary | ICD-10-CM

## 2023-08-26 DIAGNOSIS — D508 Other iron deficiency anemias: Secondary | ICD-10-CM | POA: Diagnosis not present

## 2023-08-26 DIAGNOSIS — F5101 Primary insomnia: Secondary | ICD-10-CM

## 2023-08-26 DIAGNOSIS — Z9884 Bariatric surgery status: Secondary | ICD-10-CM

## 2023-08-26 DIAGNOSIS — E519 Thiamine deficiency, unspecified: Secondary | ICD-10-CM

## 2023-08-26 DIAGNOSIS — E039 Hypothyroidism, unspecified: Secondary | ICD-10-CM

## 2023-08-26 DIAGNOSIS — E785 Hyperlipidemia, unspecified: Secondary | ICD-10-CM

## 2023-08-26 LAB — CBC WITH DIFFERENTIAL/PLATELET
Basophils Absolute: 0 10*3/uL (ref 0.0–0.1)
Basophils Relative: 0.3 % (ref 0.0–3.0)
Eosinophils Absolute: 0.1 10*3/uL (ref 0.0–0.7)
Eosinophils Relative: 1.3 % (ref 0.0–5.0)
HCT: 42.5 % (ref 39.0–52.0)
Hemoglobin: 13.9 g/dL (ref 13.0–17.0)
Lymphocytes Relative: 33.2 % (ref 12.0–46.0)
Lymphs Abs: 2.8 10*3/uL (ref 0.7–4.0)
MCHC: 32.6 g/dL (ref 30.0–36.0)
MCV: 90.7 fL (ref 78.0–100.0)
Monocytes Absolute: 0.7 10*3/uL (ref 0.1–1.0)
Monocytes Relative: 7.9 % (ref 3.0–12.0)
Neutro Abs: 4.9 10*3/uL (ref 1.4–7.7)
Neutrophils Relative %: 57.3 % (ref 43.0–77.0)
Platelets: 166 10*3/uL (ref 150.0–400.0)
RBC: 4.68 Mil/uL (ref 4.22–5.81)
RDW: 12.4 % (ref 11.5–15.5)
WBC: 8.5 10*3/uL (ref 4.0–10.5)

## 2023-08-26 LAB — URINALYSIS, ROUTINE W REFLEX MICROSCOPIC
Bilirubin Urine: NEGATIVE
Hgb urine dipstick: NEGATIVE
Ketones, ur: NEGATIVE
Leukocytes,Ua: NEGATIVE
Nitrite: NEGATIVE
RBC / HPF: NONE SEEN (ref 0–?)
Specific Gravity, Urine: 1.025 (ref 1.000–1.030)
Total Protein, Urine: NEGATIVE
Urine Glucose: NEGATIVE
Urobilinogen, UA: 0.2 (ref 0.0–1.0)
WBC, UA: NONE SEEN (ref 0–?)
pH: 6 (ref 5.0–8.0)

## 2023-08-26 LAB — BASIC METABOLIC PANEL
BUN: 22 mg/dL (ref 6–23)
CO2: 23 meq/L (ref 19–32)
Calcium: 9.4 mg/dL (ref 8.4–10.5)
Chloride: 108 meq/L (ref 96–112)
Creatinine, Ser: 0.94 mg/dL (ref 0.40–1.50)
GFR: 83.49 mL/min (ref >60.00)
Glucose, Bld: 100 mg/dL — ABNORMAL HIGH (ref 70–99)
Potassium: 4.3 meq/L (ref 3.5–5.1)
Sodium: 141 meq/L (ref 135–145)

## 2023-08-26 LAB — IBC + FERRITIN
Ferritin: 64.3 ng/mL (ref 22.0–322.0)
Iron: 52 ug/dL (ref 42–165)
Saturation Ratios: 16.6 % — ABNORMAL LOW (ref 20.0–50.0)
TIBC: 313.6 ug/dL (ref 250.0–450.0)
Transferrin: 224 mg/dL (ref 212.0–360.0)

## 2023-08-26 LAB — TSH: TSH: 1.81 u[IU]/mL (ref 0.35–5.50)

## 2023-08-26 MED ORDER — VOQUEZNA 10 MG PO TABS
1.0000 | ORAL_TABLET | Freq: Every day | ORAL | 0 refills | Status: DC
Start: 2023-08-26 — End: 2023-08-31

## 2023-08-26 MED ORDER — ZINC GLUCONATE 50 MG PO TABS
50.0000 mg | ORAL_TABLET | Freq: Every day | ORAL | 1 refills | Status: AC
Start: 2023-08-26 — End: ?

## 2023-08-26 NOTE — Patient Instructions (Signed)
Hypertension, Adult High blood pressure (hypertension) is when the force of blood pumping through the arteries is too strong. The arteries are the blood vessels that carry blood from the heart throughout the body. Hypertension forces the heart to work harder to pump blood and may cause arteries to become narrow or stiff. Untreated or uncontrolled hypertension can lead to a heart attack, heart failure, a stroke, kidney disease, and other problems. A blood pressure reading consists of a higher number over a lower number. Ideally, your blood pressure should be below 120/80. The first ("top") number is called the systolic pressure. It is a measure of the pressure in your arteries as your heart beats. The second ("bottom") number is called the diastolic pressure. It is a measure of the pressure in your arteries as the heart relaxes. What are the causes? The exact cause of this condition is not known. There are some conditions that result in high blood pressure. What increases the risk? Certain factors may make you more likely to develop high blood pressure. Some of these risk factors are under your control, including: Smoking. Not getting enough exercise or physical activity. Being overweight. Having too much fat, sugar, calories, or salt (sodium) in your diet. Drinking too much alcohol. Other risk factors include: Having a personal history of heart disease, diabetes, high cholesterol, or kidney disease. Stress. Having a family history of high blood pressure and high cholesterol. Having obstructive sleep apnea. Age. The risk increases with age. What are the signs or symptoms? High blood pressure may not cause symptoms. Very high blood pressure (hypertensive crisis) may cause: Headache. Fast or irregular heartbeats (palpitations). Shortness of breath. Nosebleed. Nausea and vomiting. Vision changes. Severe chest pain, dizziness, and seizures. How is this diagnosed? This condition is diagnosed by  measuring your blood pressure while you are seated, with your arm resting on a flat surface, your legs uncrossed, and your feet flat on the floor. The cuff of the blood pressure monitor will be placed directly against the skin of your upper arm at the level of your heart. Blood pressure should be measured at least twice using the same arm. Certain conditions can cause a difference in blood pressure between your right and left arms. If you have a high blood pressure reading during one visit or you have normal blood pressure with other risk factors, you may be asked to: Return on a different day to have your blood pressure checked again. Monitor your blood pressure at home for 1 week or longer. If you are diagnosed with hypertension, you may have other blood or imaging tests to help your health care provider understand your overall risk for other conditions. How is this treated? This condition is treated by making healthy lifestyle changes, such as eating healthy foods, exercising more, and reducing your alcohol intake. You may be referred for counseling on a healthy diet and physical activity. Your health care provider may prescribe medicine if lifestyle changes are not enough to get your blood pressure under control and if: Your systolic blood pressure is above 130. Your diastolic blood pressure is above 80. Your personal target blood pressure may vary depending on your medical conditions, your age, and other factors. Follow these instructions at home: Eating and drinking  Eat a diet that is high in fiber and potassium, and low in sodium, added sugar, and fat. An example of this eating plan is called the DASH diet. DASH stands for Dietary Approaches to Stop Hypertension. To eat this way: Eat   plenty of fresh fruits and vegetables. Try to fill one half of your plate at each meal with fruits and vegetables. Eat whole grains, such as whole-wheat pasta, brown rice, or whole-grain bread. Fill about one  fourth of your plate with whole grains. Eat or drink low-fat dairy products, such as skim milk or low-fat yogurt. Avoid fatty cuts of meat, processed or cured meats, and poultry with skin. Fill about one fourth of your plate with lean proteins, such as fish, chicken without skin, beans, eggs, or tofu. Avoid pre-made and processed foods. These tend to be higher in sodium, added sugar, and fat. Reduce your daily sodium intake. Many people with hypertension should eat less than 1,500 mg of sodium a day. Do not drink alcohol if: Your health care provider tells you not to drink. You are pregnant, may be pregnant, or are planning to become pregnant. If you drink alcohol: Limit how much you have to: 0-1 drink a day for women. 0-2 drinks a day for men. Know how much alcohol is in your drink. In the [place] one drink equals one 12 oz bottle of beer (355 mL), one 5 oz glass of wine (148 mL), or one 1 oz glass of hard liquor (44 mL). Lifestyle  Work with your health care provider to maintain a healthy body weight or to lose weight. Ask what an ideal weight is for you. Get at least 30 minutes of exercise that causes your heart to beat faster (aerobic exercise) most days of the week. Activities may include walking, swimming, or biking. Include exercise to strengthen your muscles (resistance exercise), such as Pilates or lifting weights, as part of your weekly exercise routine. Try to do these types of exercises for 30 minutes at least 3 days a week. Do not use any products that contain nicotine or tobacco. These products include cigarettes, chewing tobacco, and vaping devices, such as e-cigarettes. If you need help quitting, ask your health care provider. Monitor your blood pressure at home as told by your health care provider. Keep all follow-up visits. This is important. Medicines Take over-the-counter and prescription medicines only as told by your health care provider. Follow directions carefully. Blood  pressure medicines must be taken as prescribed. Do not skip doses of blood pressure medicine. Doing this puts you at risk for problems and can make the medicine less effective. Ask your health care provider about side effects or reactions to medicines that you should watch for. Contact a health care provider if you: Think you are having a reaction to a medicine you are taking. Have headaches that keep coming back (recurring). Feel dizzy. Have swelling in your ankles. Have trouble with your vision. Get help right away if you: Develop a severe headache or confusion. Have unusual weakness or numbness. Feel faint. Have severe pain in your chest or abdomen. Vomit repeatedly. Have trouble breathing. These symptoms may be an emergency. Get help right away. Call 911. Do not wait to see if the symptoms will go away. Do not drive yourself to the hospital. Summary Hypertension is when the force of blood pumping through your arteries is too strong. If this condition is not controlled, it may put you at risk for serious complications. Your personal target blood pressure may vary depending on your medical conditions, your age, and other factors. For most people, a normal blood pressure is less than 120/80. Hypertension is treated with lifestyle changes, medicines, or a combination of both. Lifestyle changes include losing weight, eating a healthy,   low-sodium diet, exercising more, and limiting alcohol. This information is not intended to replace advice given to you by your health care provider. Make sure you discuss any questions you have with your health care provider. Document Revised: 09/03/2021 Document Reviewed: 09/03/2021 Elsevier Patient Education  2024 [employer]  

## 2023-08-26 NOTE — Progress Notes (Unsigned)
Subjective:  Patient ID: [name] [name], male    DOB: [birth year]  Age: 67 y.o. MRN: [id number]  CC: Hypertension, Gastroesophageal Reflux, and Anemia   HPI TAHJIR SILVERIA presents for f/up ---  Discussed the use of AI scribe software for clinical note transcription with the patient, who gave verbal consent to proceed.  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 He reports no current chest pain, shortness of breath, or dizziness. He has a history of blood in the stool, but currently denies any such symptoms.  The patient has been experiencing heartburn, particularly after consuming spicy foods, and is currently out of his Protonix medication. He denies any difficulty or pain while swallowing and does not report waking up at night due to these symptoms.  He has a history of anemia and was scheduled for an iron infusion, which he has not yet received due to his busy schedule. He also reports needing to be screened for colon cancer.  The patient's blood pressure was noted to be high during the visit, which he attributes to stress from work and possibly forgetting to take his morning medication. He has a history of seeing a cardiologist and recently had an EKG, the results of which were reportedly normal.       Outpatient Medications Prior to Visit  Medication Sig Dispense Refill   acetaminophen (TYLENOL) 650 MG CR tablet Take 650 mg by mouth every 8 (eight) hours as needed for pain.     aspirin EC 81 MG tablet Take 1 tablet (81 mg total) by mouth daily. Swallow whole. 90 tablet 3   cholecalciferol (VITAMIN D3) 25 MCG (1000 UNIT) tablet Take 1,000 Units by mouth daily.     Multiple Vitamin (MULTIVITAMIN) tablet Take 1 tablet by mouth daily.     pantoprazole (PROTONIX) 40 MG tablet Take 1 tablet (40 mg total) by mouth daily. 90 tablet 1   tadalafil (CIALIS) 20 MG tablet  Take 1 tablet (20 mg total) by mouth daily as needed for erectile dysfunction. 20 tablet 5   traMADol (ULTRAM) 50 MG tablet TAKE 1 TABLET BY MOUTH EVERY 8 HOURS AS NEEDED 90 tablet 3   Turmeric 450 MG CAPS Take 1 capsule by mouth 2 (two) times daily with a meal. 180 capsule 3   olmesartan (BENICAR) 20 MG tablet TAKE 1 TABLET BY MOUTH EVERY DAY 90 tablet 1   rosuvastatin (CRESTOR) 10 MG tablet Take 1 tablet (10 mg total) by mouth daily. 90 tablet 1   zinc gluconate ([facility] ZINC GLUCONATE) 50 MG tablet Take 1 tablet (50 mg total) by mouth daily. 90 tablet 1   zolpidem (AMBIEN CR) 12.5 MG CR tablet TAKE 1 TABLET BY MOUTH AT BEDTIME AS NEEDED FOR SLEEP. 90 tablet 0   No facility-administered medications prior to visit.    ROS Review of Systems  Constitutional:  Positive for unexpected weight change (wt gain). Negative for appetite change, chills, diaphoresis and fatigue.  HENT: Negative.    Eyes: Negative.  Negative for visual disturbance.  Respiratory:  Negative for cough, chest tightness, shortness of breath and wheezing.   Cardiovascular:  Negative for chest pain, palpitations and leg swelling.  Gastrointestinal: Negative.  Negative for abdominal pain, blood in stool, constipation, diarrhea, nausea and vomiting.  Endocrine: Negative.   Genitourinary: Negative.  Negative for difficulty urinating.  Musculoskeletal: Negative.  Negative for arthralgias and myalgias.  Skin: Negative.   Neurological: Negative.  Negative for dizziness, weakness and light-headedness.  Hematological:  Negative for adenopathy. Does not bruise/bleed easily.  Psychiatric/Behavioral:  Positive for sleep disturbance. Negative for confusion, decreased concentration, dysphoric mood and hallucinations. The patient is not nervous/anxious.     Objective:  BP (!) 158/94 (BP Location: Right Arm, Patient Position: Sitting, Cuff Size: Large)   Pulse 82   Temp 98.1 F (36.7 C) (Oral)   Resp 16   Ht 6\' 2"  (1.88 m)   Wt 240 lb  (108.9 kg)   SpO2 96%   BMI 30.81 kg/m   BP Readings from Last 3 Encounters:  08/26/23 (!) 158/94  02/02/23 138/82  01/26/23 (!) 128/90    Wt Readings from Last 3 Encounters:  08/26/23 240 lb (108.9 kg)  03/12/23 224 lb (101.6 kg)  02/02/23 224 lb (101.6 kg)    Physical Exam Vitals reviewed.  Constitutional:      Appearance: Normal appearance.  HENT:     Nose: Nose normal.     Mouth/Throat:     Mouth: Mucous membranes are moist.  Eyes:     General: No scleral icterus.    Conjunctiva/sclera: Conjunctivae normal.  Cardiovascular:     Rate and Rhythm: Normal rate and regular rhythm.     Heart sounds: No murmur heard.    No gallop.  Pulmonary:     Effort: Pulmonary effort is normal.     Breath sounds: No stridor. No wheezing, rhonchi or rales.  Abdominal:     General: Abdomen is protuberant. Bowel sounds are normal. There is no distension.     Palpations: Abdomen is soft. There is no hepatomegaly, splenomegaly or mass.     Tenderness: There is no abdominal tenderness.  Musculoskeletal:        General: Normal range of motion.     Cervical back: Neck supple.     Right lower leg: No edema.     Left lower leg: No edema.  Lymphadenopathy:     Cervical: No cervical adenopathy.  Skin:    General: Skin is warm and dry.  Neurological:     General: No focal deficit present.     Mental Status: He is alert. Mental status is at baseline.  Psychiatric:        Mood and Affect: Mood normal.        Behavior: Behavior normal.     Lab Results  Component Value Date   WBC 8.5 08/26/2023   HGB 13.9 08/26/2023   HCT 42.5 08/26/2023   PLT 166.0 08/26/2023   GLUCOSE 100 (H) 08/26/2023   CHOL 125 01/26/2023   TRIG 86 01/26/2023   HDL 53 01/26/2023   LDLDIRECT 85.0 06/13/2021   LDLCALC 55 01/26/2023   ALT 22 02/02/2023   AST 27 02/02/2023   NA 141 08/26/2023   K 4.3 08/26/2023   CL 108 08/26/2023   CREATININE 0.94 08/26/2023   BUN 22 08/26/2023   CO2 23 08/26/2023   TSH  1.81 08/26/2023   PSA 0.38 02/02/2023   INR 1.0 11/13/2020   HGBA1C 5.8 05/15/2022   MICROALBUR 4.2 (H) 12/30/2007    CT ANGIO CHEST AORTA W/CM & OR WO/CM  Result Date: 03/12/2023 CLINICAL DATA:  Thoracic aorta disease, pre-op planning EXAM: CT ANGIOGRAPHY CHEST WITH CONTRAST TECHNIQUE: Multidetector CT imaging of the chest was performed using the standard protocol during bolus administration of intravenous contrast. Multiplanar CT image reconstructions and MIPs were obtained to evaluate the vascular anatomy. RADIATION  DOSE REDUCTION: This exam was performed according to the departmental dose-optimization program which includes automated exposure control, adjustment of the mA and/or kV according to patient size and/or use of iterative reconstruction technique. CONTRAST:  75mL ISOVUE-370 IOPAMIDOL (ISOVUE-370) INJECTION 76% COMPARISON:  October 31, 2021. November 14, 2020 FINDINGS: Cardiovascular: Heart is mildly enlarged. No pericardial effusion. Predominately LEFT-sided coronary artery atherosclerotic calcifications. No evidence of aortic dissection. Similar mild aneurysmal dilation of the ascending thoracic aorta to approximately 4.1 by 4.0 cm. Relative preservation of the sino-tubular junction. Aortic arch and descending thoracic aorta are normal in size. Scattered atherosclerotic calcifications. No central pulmonary embolism. There are a few aortic valve calcifications. Mediastinum/Nodes: Visualized thyroid is unremarkable. No axillary or mediastinal adenopathy. Lungs/Pleura: No pleural effusion or pneumothorax. Mild centrilobular emphysema. Similar tiny scattered pulmonary nodules since January 2022, most consistent with a benign etiology. Representative subpleural 4 x 2 mm pulmonary nodule of the RIGHT middle lobe is unchanged (series 5, image 236). Mild bronchial wall thickening. Scattered calcified pulmonary nodules. Upper Abdomen: Status post gastric banding.  No acute abnormality. Musculoskeletal:  Multiple remote LEFT-sided rib fractures. Review of the MIP images confirms the above findings. IMPRESSION: 1. Similar mild aneurysmal dilation of the ascending thoracic aorta to approximately 4.1 cm. Recommend annual imaging followup by CTA or MRA. This recommendation follows 2010 ACCF/AHA/AATS/ACR/ASA/SCA/SCAI/SIR/STS/SVM Guidelines for the Diagnosis and Management of Patients with Thoracic Aortic Disease. Circulation. 2010; 121: B147-W295. Aortic aneurysm NOS (ICD10-I71.9) 2. Mild bronchial wall thickening, likely reflecting chronic bronchitis. Aortic Atherosclerosis (ICD10-I70.0) and Emphysema (ICD10-J43.9). Electronically Signed   By: Meda Klinefelter M.D.   On: 03/12/2023 15:49    Assessment & Plan:  Thiamine deficiency -     CBC with Differential/Platelet; Future -     Vitamin B1; Future  Acquired hypothyroidism - He is euthyroid. -     TSH; Future  Essential hypertension- He has not achieved his BP goal. Will increase the ARB dose. -     Basic metabolic panel; Future -     Urinalysis, Routine w reflex microscopic; Future -     Olmesartan Medoxomil; Take 1 tablet (40 mg total) by mouth daily.  Dispense: 90 tablet; Refill: 0  Zinc deficiency -     CBC with Differential/Platelet; Future -     Zinc Gluconate; Take 1 tablet (50 mg total) by mouth daily.  Dispense: 90 tablet; Refill: 1  LAP-BAND surgery status -     CBC with Differential/Platelet; Future -     Zinc Gluconate; Take 1 tablet (50 mg total) by mouth daily.  Dispense: 90 tablet; Refill: 1  Iron deficiency anemia due to dietary causes - H/H are normal. -     IBC + Ferritin; Future -     CBC with Differential/Platelet; Future  Screening for colon cancer -     Ambulatory referral to Gastroenterology  Flu vaccine need -     Flu Vaccine Trivalent High Dose (Fluad)  Gastroesophageal reflux disease with esophagitis without hemorrhage -     Voquezna; Take 1 tablet by mouth daily.  Dispense: 90 tablet; Refill:  0  Hyperlipidemia with target LDL less than 100 -     Rosuvastatin Calcium; Take 1 tablet (10 mg total) by mouth daily.  Dispense: 90 tablet; Refill: 0  Primary insomnia -     Zolpidem Tartrate ER; Take 1 tablet (12.5 mg total) by mouth at bedtime as needed. for sleep  Dispense: 90 tablet; Refill: 0 -     AMB Referral [facility] Care Management  Follow-up: Return in about 3 months (around 11/26/2023).  Sanda Linger, MD

## 2023-08-27 MED ORDER — OLMESARTAN MEDOXOMIL 40 MG PO TABS
40.0000 mg | ORAL_TABLET | Freq: Every day | ORAL | 0 refills | Status: DC
Start: 2023-08-27 — End: 2024-06-20

## 2023-08-27 MED ORDER — ROSUVASTATIN CALCIUM 10 MG PO TABS: 10.0000 mg | ORAL_TABLET | Freq: Every day | ORAL | 0 refills | Status: DC

## 2023-08-27 MED ORDER — ZOLPIDEM TARTRATE ER 12.5 MG PO TBCR: 12.5000 mg | EXTENDED_RELEASE_TABLET | Freq: Every evening | ORAL | 0 refills | Status: DC | PRN

## 2023-08-27 MED ORDER — OLMESARTAN MEDOXOMIL 40 MG PO TABS
40.0000 mg | ORAL_TABLET | Freq: Every day | ORAL | 0 refills | Status: DC
Start: 2023-08-27 — End: 2023-08-27

## 2023-08-28 ENCOUNTER — Telehealth: Payer: Self-pay | Admitting: Internal Medicine

## 2023-08-28 NOTE — Telephone Encounter (Signed)
Prescription Request  08/28/2023                                  LOV: 08/26/2023  What is the name of the medication or equipment? pantoprazole  Have you contacted your pharmacy to request a refill? Yes   Which pharmacy would you like this sent to?  [id number], [place] - [address] [place] [address] Phone: [phone] Fax: [phone]     PATIENT ALSO WANTS WHATEVER MEDICATION THAT WAS SENT TO [facility] [place] - HE DOES NOT KNOW WHAT MEDICATION IT IS.     Patient notified that their request is being sent to the clinical staff for review and that they should receive a response within 2 business days.   Please advise at Mobile [phone] (mobile)

## 2023-08-29 LAB — VITAMIN B1: Vitamin B1 (Thiamine): 24 nmol/L (ref 8–30)

## 2023-08-31 ENCOUNTER — Other Ambulatory Visit: Payer: Self-pay | Admitting: Internal Medicine

## 2023-08-31 DIAGNOSIS — K2101 Gastro-esophageal reflux disease with esophagitis, with bleeding: Secondary | ICD-10-CM

## 2023-08-31 MED ORDER — PANTOPRAZOLE SODIUM 40 MG PO TBEC: 40.0000 mg | DELAYED_RELEASE_TABLET | Freq: Every day | ORAL | 0 refills | Status: DC

## 2023-09-02 ENCOUNTER — Telehealth: Payer: Self-pay

## 2023-09-02 NOTE — Progress Notes (Signed)
   Care Guide Note  09/02/2023 Name: CLEASON FEICK MRN: [id number] DOB: [birth year]  Referred by: Etta Grandchild, MD Reason for referral : Care Coordination (Outreach to schedule with Pharm d )   SEPEHR PLYLER is a 67 y.o. year old male who is a primary care patient of Etta Grandchild, MD. [name] was referred to the pharmacist for assistance related to  insomnia .    An unsuccessful telephone outreach was attempted today to contact the patient who was referred to the pharmacy team for assistance with medication management. Additional attempts will be made to contact the patient.   Penne Lash, RMA Care Guide [facility]  [place], [place] [address] Direct Dial: [phone] [email or web address]

## 2023-09-14 NOTE — Progress Notes (Signed)
   Care Guide Note  09/14/2023 Name: [name] LESSER MRN: [id number] DOB: 04-18-1956  Referred by: Etta Grandchild, MD Reason for referral : Care Coordination (Outreach to schedule with Pharm d )   [name] is a 67 y.o. year old male who is a primary care patient of Etta Grandchild, MD. [name] was referred to the pharmacist for assistance related to  insomnia .    Successful contact was made with the patient to discuss pharmacy services. Patient declines engagement at this time. Contact information was provided to the patient should they wish to reach out for assistance at a later time.  Penne Lash, RMA Care Guide [facility]  [place], [place] [address] Direct Dial: [phone] [email or web address]

## 2023-09-16 ENCOUNTER — Other Ambulatory Visit: Payer: Self-pay | Admitting: Internal Medicine

## 2023-09-16 DIAGNOSIS — M19041 Primary osteoarthritis, right hand: Secondary | ICD-10-CM

## 2023-09-16 DIAGNOSIS — M170 Bilateral primary osteoarthritis of knee: Secondary | ICD-10-CM

## 2023-09-16 DIAGNOSIS — M51369 Other intervertebral disc degeneration, lumbar region without mention of lumbar back pain or lower extremity pain: Secondary | ICD-10-CM

## 2023-09-28 ENCOUNTER — Ambulatory Visit: Payer: PPO | Admitting: Internal Medicine

## 2023-11-26 ENCOUNTER — Other Ambulatory Visit: Payer: Self-pay | Admitting: Internal Medicine

## 2023-11-26 DIAGNOSIS — K2101 Gastro-esophageal reflux disease with esophagitis, with bleeding: Secondary | ICD-10-CM

## 2023-12-20 ENCOUNTER — Other Ambulatory Visit: Payer: Self-pay | Admitting: Internal Medicine

## 2023-12-20 DIAGNOSIS — F5101 Primary insomnia: Secondary | ICD-10-CM

## 2024-01-13 ENCOUNTER — Other Ambulatory Visit: Payer: Self-pay | Admitting: Internal Medicine

## 2024-01-13 DIAGNOSIS — E785 Hyperlipidemia, unspecified: Secondary | ICD-10-CM

## 2024-01-20 ENCOUNTER — Other Ambulatory Visit: Payer: Self-pay | Admitting: Internal Medicine

## 2024-01-20 DIAGNOSIS — I10 Essential (primary) hypertension: Secondary | ICD-10-CM

## 2024-02-13 ENCOUNTER — Other Ambulatory Visit: Payer: Self-pay | Admitting: Internal Medicine

## 2024-02-13 DIAGNOSIS — I10 Essential (primary) hypertension: Secondary | ICD-10-CM

## 2024-02-22 ENCOUNTER — Other Ambulatory Visit: Payer: Self-pay | Admitting: Internal Medicine

## 2024-02-22 DIAGNOSIS — K2101 Gastro-esophageal reflux disease with esophagitis, with bleeding: Secondary | ICD-10-CM

## 2024-03-04 ENCOUNTER — Other Ambulatory Visit: Payer: Self-pay | Admitting: Internal Medicine

## 2024-03-04 DIAGNOSIS — I7121 Aneurysm of the ascending aorta, without rupture: Secondary | ICD-10-CM

## 2024-03-14 ENCOUNTER — Telehealth: Payer: Self-pay | Admitting: Internal Medicine

## 2024-03-14 ENCOUNTER — Other Ambulatory Visit: Payer: Self-pay | Admitting: Internal Medicine

## 2024-03-14 DIAGNOSIS — K2101 Gastro-esophageal reflux disease with esophagitis, with bleeding: Secondary | ICD-10-CM

## 2024-03-14 NOTE — Telephone Encounter (Signed)
 Copied from CRM [id number]. Topic: Clinical - Medication Question >> Mar 14, 2024 11:20 AM Deaijah H wrote: Reason for CRM: Patient would like to know whether or not his prescription pantoprazole  (PROTONIX ) 40 MG tablet will be approved for refill. Requested 3wks ago. Please call [phone] with an update

## 2024-03-15 ENCOUNTER — Ambulatory Visit (INDEPENDENT_AMBULATORY_CARE_PROVIDER_SITE_OTHER): Payer: PPO

## 2024-03-15 VITALS — Ht 74.0 in | Wt 240.0 lb

## 2024-03-15 DIAGNOSIS — Z0000 Encounter for general adult medical examination without abnormal findings: Secondary | ICD-10-CM | POA: Diagnosis not present

## 2024-03-15 DIAGNOSIS — K635 Polyp of colon: Secondary | ICD-10-CM | POA: Diagnosis not present

## 2024-03-15 NOTE — Addendum Note (Signed)
 Addended by: Patria Bookbinder on: 03/15/2024 08:37 AM   Modules accepted: Orders

## 2024-03-15 NOTE — Progress Notes (Signed)
 Subjective:   [name] [name] is a 68 y.o. who presents for a Medicare Wellness preventive visit.  Visit Complete: Virtual I connected with  [name] [name] on 03/15/24 by a audio enabled telemedicine application and verified that I am speaking with the correct person using two identifiers.  Patient Location: Home  Provider Location: Office/Clinic  I discussed the limitations of evaluation and management by telemedicine. The patient expressed understanding and agreed to proceed.  Vital Signs: Because this visit was a virtual/telehealth visit, some criteria may be missing or patient reported. Any vitals not documented were not able to be obtained and vitals that have been documented are patient reported.  VideoDeclined- This patient declined Librarian, academic. Therefore the visit was completed with audio only.  Persons Participating in Visit: Patient.  AWV Questionnaire: Yes: Patient Medicare AWV questionnaire was completed by the patient on 03/14/2024; I have confirmed that all information answered by patient is correct and no changes since this date.  Cardiac Risk Factors include: advanced age (>17men, >68 women);hypertension;dyslipidemia;male gender;obesity (BMI >30kg/m2)     Objective:    Today's Vitals   03/15/24 0821  Weight: 240 lb (108.9 kg)  Height: 6\' 2"  (1.88 m)   Body mass index is 30.81 kg/m.     03/15/2024    8:12 AM 03/12/2023    9:05 AM 03/24/2022    3:49 PM 11/02/2020    8:21 AM 10/30/2020    5:00 PM 10/30/2020   10:43 AM 10/18/2020    8:19 AM  Advanced Directives  Does Patient Have a Medical Advance Directive? No Yes Yes No Yes Yes No  Type of Special educational needs teacher of [place];Living will Living will;Healthcare Power of Attorney  Living will Living will   Does patient want to make changes to medical advance directive?   No - Patient declined No - Patient declined No - Patient declined No - Patient declined   Copy of  Healthcare Power of Attorney in Chart?  No - copy requested No - copy requested      Would patient like information on creating a medical advance directive? No - Patient declined   No - Patient declined       Current Medications (verified) Outpatient Encounter Medications as of 03/15/2024  Medication Sig   acetaminophen  (TYLENOL ) 650 MG CR tablet Take 650 mg by mouth every 8 (eight) hours as needed for pain.   aspirin  EC 81 MG tablet Take 1 tablet (81 mg total) by mouth daily. Swallow whole.   cholecalciferol  (VITAMIN D3) 25 MCG (1000 UNIT) tablet Take 1,000 Units by mouth daily.   Multiple Vitamin (MULTIVITAMIN) tablet Take 1 tablet by mouth daily.   olmesartan  (BENICAR ) 40 MG tablet Take 1 tablet (40 mg total) by mouth daily.   pantoprazole  (PROTONIX ) 40 MG tablet TAKE 1 TABLET BY MOUTH EVERY DAY   rosuvastatin  (CRESTOR ) 10 MG tablet TAKE 1 TABLET BY MOUTH EVERY DAY   tadalafil  (CIALIS ) 20 MG tablet Take 1 tablet (20 mg total) by mouth daily as needed for erectile dysfunction.   traMADol  (ULTRAM ) 50 MG tablet Take 1 tablet (50 mg total) by mouth every 8 (eight) hours as needed.   Turmeric 450 MG CAPS Take 1 capsule by mouth 2 (two) times daily with a meal.   zinc  gluconate ([employer] ZINC  GLUCONATE) 50 MG tablet Take 1 tablet (50 mg total) by mouth daily.   zolpidem  (AMBIEN  CR) 12.5 MG CR tablet TAKE 1 TABLET (12.5 MG TOTAL)  BY MOUTH AT BEDTIME AS NEEDED. FOR SLEEP   No facility-administered encounter medications on file as of 03/15/2024.    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   Past Surgical History:  Procedure Laterality Date   Arthroscopy X 1 left ([name])     X 2 right   COLONOSCOPY      correction of deviated septum     DECORTICATION Left 10/26/2020   Procedure: DECORTICATION OF LEFT LUNG, APPLICATION OF ON Q-PUMP FOR PAIN MANAGEMENT;  Surgeon: Heriberto London, MD;  Location: [facility] OR;  Service: Thoracic;  Laterality: Left;   GANGLION CYST EXCISION     right wrist (kuzma)   GASTRIC BANDING PORT REVISION  8/11   PILONIDAL CYST / SINUS EXCISION  68 yrs old   TONSILLECTOMY     ULNAR COLLATERAL LIGAMENT REPAIR Right 09/21/2013   Procedure: ULNAR COLLATERAL LIGAMENT REPAIR METACARPAL PHALANGE RIGHT THUMB POSSIBLE APL GRAFT RECONSTRUCTION;  Surgeon: Kemp Patter, MD;  Location: [facility];  Service: Orthopedics;  Laterality: Right;   VIDEO ASSISTED THORACOSCOPY (VATS)/EMPYEMA Left 10/26/2020   Procedure: VIDEO ASSISTED THORACOSCOPY (VATS)/EMPYEMA ,INSERTION OF LEFT PLEURAL CHEST TUBES X 3;  Surgeon: Heriberto London, MD;  Location: [facility] OR;  Service: Thoracic;  Laterality: Left;   Family History  Problem Relation Age of Onset   Cancer Father        lung cancer metatstatic cancer to brain   Coronary artery disease Father    Heart attack Father        X 2 (41,45)   Heart disease Father        CAD/MI x 2   Cancer Mother        rapid on set   Hypertension Mother    Parkinsonism Maternal Grandfather    Diabetes Neg Hx    Colon cancer Neg Hx    Colon polyps Neg Hx    Esophageal cancer Neg Hx    Rectal cancer Neg Hx    Stomach cancer Neg Hx    Social History   Socioeconomic History   Marital status: Married    Spouse name: Not on file   Number of children: 2   Years of education: 12   Highest education level: 12th grade  Occupational History   Occupation: Trucking     Employer: [employer]  Tobacco Use   Smoking status: Former    Current packs/day: 0.00    Types: Cigarettes    Quit date: 06/13/1995    Years since quitting: 28.7    Passive exposure: Past   Smokeless tobacco: Never  Vaping Use   Vaping status: Never Used  Substance and Sexual  Activity   Alcohol use: No    Alcohol/week: 0.0 standard drinks of alcohol   Drug use: No   Sexual activity: Yes    Partners: Female  Other Topics Concern   Not on file  Social History Narrative   HSG, [employer]  - BA business. Married '76  2 sons - '77, '80. Marriage in good health. 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    Social Drivers of Health  Financial Resource Strain: Low Risk  (03/15/2024)   Overall Financial Resource Strain (CARDIA)    Difficulty of Paying Living Expenses: Not hard at all  Food Insecurity: No Food Insecurity (03/15/2024)   Hunger Vital Sign    Worried About Running Out of Food in the Last Year: Never true    Ran Out of Food in the Last Year: Never true  Transportation Needs: No Transportation Needs (03/15/2024)   PRAPARE - Administrator, Civil Service (Medical): No    Lack of Transportation (Non-Medical): No  Physical Activity: Sufficiently Active (03/15/2024)   Exercise Vital Sign    Days of Exercise per Week: 3 days    Minutes of Exercise per Session: 60 min  Stress: No Stress Concern Present (03/15/2024)   [employer] of Occupational Health - Occupational Stress Questionnaire    Feeling of Stress : Not at all  Social Connections: Socially Integrated (03/15/2024)   Social Connection and Isolation Panel [NHANES]    Frequency of Communication with Friends and Family: More than three times a week    Frequency of Social Gatherings with Friends and Family: More than three times a week    Attends Religious Services: More than 4 times per year    Active Member of [employer] or Organizations: Yes    Attends Engineer, structural: More than 4 times per year    Marital Status: Married    Tobacco Counseling Counseling given: No    Clinical Intake:  Pre-visit preparation completed: Yes  Pain : No/denies pain     BMI - recorded: 30.81 Nutritional Status: BMI > 30  Obese Nutritional Risks: None Diabetes:  No  Lab Results  Component Value Date   HGBA1C 5.8 05/15/2022   HGBA1C 6.1 11/13/2020   HGBA1C 5.7 02/24/2020     How often do you need to have someone help you when you read instructions, pamphlets, or other written materials from your doctor or pharmacy?: 1 - Never  Interpreter Needed?: No  Information entered by :: Kandy Orris, CMA   Activities of Daily Living     03/15/2024    8:29 AM 03/14/2024    6:10 PM  In your present state of health, do you have any difficulty performing the following activities:  Hearing? 0 0  Vision? 0 0  Difficulty concentrating or making decisions? 0 0  Walking or climbing stairs? 0 0  Dressing or bathing? 0 0  Doing errands, shopping? 0 0  Preparing Food and eating ? N N  Using the Toilet? N N  In the past six months, have you accidently leaked urine? N N  Do you have problems with loss of bowel control? N N  Managing your Medications? N N  Managing your Finances? N N  Housekeeping or managing your Housekeeping? N N    Patient Care Team: Arcadio Knuckles, MD as PCP - General (Internal Medicine) Bridgette Campus, MD as PCP - Cardiology (Cardiology) Corie Diamond, MD as Attending Physician (Ophthalmology)  Indicate any recent Medical Services you may have received from other than [facility] providers in the past year (date may be approximate).     Assessment:   This is a routine wellness examination for [place].  Hearing/Vision screen Hearing Screening - Comments:: Wears hearing aids sometimes Vision Screening - Comments:: Wears rx glasses - up to date with routine eye exams with Dr Danley Dusky   Goals Addressed  This Visit's Progress     Patient Stated (pt-stated)        Patient stated he wants to exercise more and lose weight (about 10lbs).       Depression Screen     03/15/2024    8:25 AM 03/12/2023    9:06 AM 03/24/2022    3:53 PM 06/13/2021    4:10 PM 02/23/2020    4:11 PM 12/09/2018    1:31 PM 09/24/2017    1:24 PM   PHQ 2/9 Scores  PHQ - 2 Score 0 0 0 0 0 0 1  PHQ- 9 Score 0 0         Fall Risk     03/15/2024    8:28 AM 03/14/2024    6:10 PM 03/12/2023    9:06 AM 03/09/2023    1:11 PM 03/24/2022    3:50 PM  Fall Risk   Falls in the past year? 0 0 0 0 0  Number falls in past yr: 0  0  0  Injury with Fall? 0  0 0 0  Risk for fall due to : No Fall Risks  No Fall Risks  No Fall Risks  Follow up Falls prevention discussed;Falls evaluation completed  Falls prevention discussed  Falls evaluation completed    MEDICARE RISK AT HOME:  Medicare Risk at Home Any stairs in or around the home?: No If so, are there any without handrails?: No Home free of loose throw rugs in walkways, pet beds, electrical cords, etc?: Yes Adequate lighting in your home to reduce risk of falls?: Yes Life alert?: No Use of a cane, walker or w/c?: No Grab bars in the bathroom?: No Shower chair or bench in shower?: No Elevated toilet seat or a handicapped toilet?: No  TIMED UP AND GO:  Was the test performed?  No  Cognitive Function: 6CIT completed        03/15/2024    8:28 AM 03/12/2023    9:07 AM 03/24/2022    4:08 PM  6CIT Screen  What Year? 0 points 0 points 0 points  What month? 0 points 0 points 0 points  What time? 0 points 0 points 0 points  Count back from 20 0 points 0 points 0 points  Months in reverse 0 points 0 points 0 points  Repeat phrase 0 points 0 points 0 points  Total Score 0 points 0 points 0 points    Immunizations Immunization History  Administered Date(s) Administered   Fluad Quad(high Dose 65+) 07/24/2019, 08/02/2022   Fluad Trivalent(High Dose 65+) 08/26/2023   Hepatitis A, Adult 03/07/2020, 09/06/2020   Hepb-cpg 03/07/2020, 04/06/2020   Influenza Whole 08/06/2009, 09/02/2010, 09/08/2012   Influenza, High Dose Seasonal PF 07/17/2020   Influenza,inj,Quad PF,6+ Mos 08/06/2014, 07/31/2017, 09/22/2018, 08/11/2019   Influenza-Unspecified 06/29/2015, 07/17/2020, 08/12/2021   PFIZER(Purple  Top)SARS-COV-2 Vaccination 01/19/2020, 02/13/2020, 08/14/2020, 04/06/2021   PNEUMOCOCCAL CONJUGATE-20 08/02/2022   Pfizer Covid-19 Vaccine Bivalent Booster 37yrs & up 08/12/2021   Pneumococcal Conjugate-13 11/13/2020   Td 09/02/2010   Tdap 03/07/2020   Zoster Recombinant(Shingrix ) 12/09/2018, 07/24/2019   Zoster, Live 07/27/2015    Screening Tests Health Maintenance  Topic Date Due   Colonoscopy  02/04/2022   COVID-19 Vaccine (6 - 2024-25 season) 07/12/2023   INFLUENZA VACCINE  06/10/2024   Medicare Annual Wellness (AWV)  03/15/2025   DTaP/Tdap/Td (3 - Td or Tdap) 03/07/2030   Pneumonia Vaccine 3+ Years old  Completed   Hepatitis C Screening  Completed   Zoster Vaccines-  Shingrix   Completed   HPV VACCINES  Aged Out   Meningococcal B Vaccine  Aged Out    Health Maintenance  Health Maintenance Due  Topic Date Due   Colonoscopy  02/04/2022   COVID-19 Vaccine (6 - 2024-25 season) 07/12/2023   Health Maintenance Items Addressed: 03/15/2024  Referral to GI for a repeat Colonoscopy - h/o colon polyps   Additional Screening:  Vision Screening: Recommended annual ophthalmology exams for early detection of glaucoma and other disorders of the eye.  Dental Screening: Recommended annual dental exams for proper oral hygiene  Community Resource Referral / Chronic Care Management: CRR required this visit?  No   CCM required this visit?  No     Plan:     I have personally reviewed and noted the following in the patient's chart:   Medical and social history Use of alcohol, tobacco or illicit drugs  Current medications and supplements including opioid prescriptions. Patient is currently taking opioid prescriptions. Information provided to patient regarding non-opioid alternatives. Patient advised to discuss non-opioid treatment plan with their provider. Functional ability and status Nutritional status Physical activity Advanced directives List of other  physicians Hospitalizations, surgeries, and ER visits in previous 12 months Vitals Screenings to include cognitive, depression, and falls Referrals and appointments  In addition, I have reviewed and discussed with patient certain preventive protocols, quality metrics, and best practice recommendations. A written personalized care plan for preventive services as well as general preventive health recommendations were provided to patient.     Patria Bookbinder, CMA   03/15/2024   After Visit Summary: (MyChart) Due to this being a telephonic visit, the after visit summary with patients personalized plan was offered to patient via MyChart   Notes: Nothing significant to report at this time.

## 2024-03-15 NOTE — Progress Notes (Signed)
 Subjective:   [name] [name] is a 68 y.o. who presents for a Medicare Wellness preventive visit.  Visit Complete: Virtual I connected with  [name] [name] on 03/15/24 by a audio enabled telemedicine application and verified that I am speaking with the correct person using two identifiers.  Patient Location: Home  Provider Location: Office/Clinic  I discussed the limitations of evaluation and management by telemedicine. The patient expressed understanding and agreed to proceed.  Vital Signs: Because this visit was a virtual/telehealth visit, some criteria may be missing or patient reported. Any vitals not documented were not able to be obtained and vitals that have been documented are patient reported.  VideoDeclined- This patient declined Librarian, academic. Therefore the visit was completed with audio only.  Persons Participating in Visit: Patient.  AWV Questionnaire: Yes: Patient Medicare AWV questionnaire was completed by the patient on 03/14/2024; I have confirmed that all information answered by patient is correct and no changes since this date.  Cardiac Risk Factors include: advanced age (>63men, >31 women);hypertension;dyslipidemia;male gender;obesity (BMI >30kg/m2)     Objective:    Today's Vitals   03/15/24 0821  Weight: 240 lb (108.9 kg)  Height: 6\' 2"  (1.88 m)   Body mass index is 30.81 kg/m.     03/15/2024    8:12 AM 03/12/2023    9:05 AM 03/24/2022    3:49 PM 11/02/2020    8:21 AM 10/30/2020    5:00 PM 10/30/2020   10:43 AM 10/18/2020    8:19 AM  Advanced Directives  Does Patient Have a Medical Advance Directive? No Yes Yes No Yes Yes No  Type of Special educational needs teacher of [place];Living will Living will;Healthcare Power of Attorney  Living will Living will   Does patient want to make changes to medical advance directive?   No - Patient declined No - Patient declined No - Patient declined No - Patient declined   Copy of  Healthcare Power of Attorney in Chart?  No - copy requested No - copy requested      Would patient like information on creating a medical advance directive? No - Patient declined   No - Patient declined       Current Medications (verified) Outpatient Encounter Medications as of 03/15/2024  Medication Sig   acetaminophen  (TYLENOL ) 650 MG CR tablet Take 650 mg by mouth every 8 (eight) hours as needed for pain.   aspirin  EC 81 MG tablet Take 1 tablet (81 mg total) by mouth daily. Swallow whole.   cholecalciferol  (VITAMIN D3) 25 MCG (1000 UNIT) tablet Take 1,000 Units by mouth daily.   Multiple Vitamin (MULTIVITAMIN) tablet Take 1 tablet by mouth daily.   olmesartan  (BENICAR ) 40 MG tablet Take 1 tablet (40 mg total) by mouth daily.   pantoprazole  (PROTONIX ) 40 MG tablet TAKE 1 TABLET BY MOUTH EVERY DAY   rosuvastatin  (CRESTOR ) 10 MG tablet TAKE 1 TABLET BY MOUTH EVERY DAY   tadalafil  (CIALIS ) 20 MG tablet Take 1 tablet (20 mg total) by mouth daily as needed for erectile dysfunction.   traMADol  (ULTRAM ) 50 MG tablet Take 1 tablet (50 mg total) by mouth every 8 (eight) hours as needed.   Turmeric 450 MG CAPS Take 1 capsule by mouth 2 (two) times daily with a meal.   zinc  gluconate ([employer] ZINC  GLUCONATE) 50 MG tablet Take 1 tablet (50 mg total) by mouth daily.   zolpidem  (AMBIEN  CR) 12.5 MG CR tablet TAKE 1 TABLET (12.5 MG TOTAL)  BY MOUTH AT BEDTIME AS NEEDED. FOR SLEEP   No facility-administered encounter medications on file as of 03/15/2024.    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   Past Surgical History:  Procedure Laterality Date   Arthroscopy X 1 left ([name])     X 2 right   COLONOSCOPY      correction of deviated septum     DECORTICATION Left 10/26/2020   Procedure: DECORTICATION OF LEFT LUNG, APPLICATION OF ON Q-PUMP FOR PAIN MANAGEMENT;  Surgeon: Heriberto London, MD;  Location: [facility] OR;  Service: Thoracic;  Laterality: Left;   GANGLION CYST EXCISION     right wrist (kuzma)   GASTRIC BANDING PORT REVISION  8/11   PILONIDAL CYST / SINUS EXCISION  68 yrs old   TONSILLECTOMY     ULNAR COLLATERAL LIGAMENT REPAIR Right 09/21/2013   Procedure: ULNAR COLLATERAL LIGAMENT REPAIR METACARPAL PHALANGE RIGHT THUMB POSSIBLE APL GRAFT RECONSTRUCTION;  Surgeon: Kemp Patter, MD;  Location: [facility];  Service: Orthopedics;  Laterality: Right;   VIDEO ASSISTED THORACOSCOPY (VATS)/EMPYEMA Left 10/26/2020   Procedure: VIDEO ASSISTED THORACOSCOPY (VATS)/EMPYEMA ,INSERTION OF LEFT PLEURAL CHEST TUBES X 3;  Surgeon: Heriberto London, MD;  Location: [facility] OR;  Service: Thoracic;  Laterality: Left;   Family History  Problem Relation Age of Onset   Cancer Father        lung cancer metatstatic cancer to brain   Coronary artery disease Father    Heart attack Father        X 2 (41,45)   Heart disease Father        CAD/MI x 2   Cancer Mother        rapid on set   Hypertension Mother    Parkinsonism Maternal Grandfather    Diabetes Neg Hx    Colon cancer Neg Hx    Colon polyps Neg Hx    Esophageal cancer Neg Hx    Rectal cancer Neg Hx    Stomach cancer Neg Hx    Social History   Socioeconomic History   Marital status: Married    Spouse name: Not on file   Number of children: 2   Years of education: 12   Highest education level: 12th grade  Occupational History   Occupation: Trucking     Employer: [employer]  Tobacco Use   Smoking status: Former    Current packs/day: 0.00    Types: Cigarettes    Quit date: 06/13/1995    Years since quitting: 28.7    Passive exposure: Past   Smokeless tobacco: Never  Vaping Use   Vaping status: Never Used  Substance and Sexual  Activity   Alcohol use: No    Alcohol/week: 0.0 standard drinks of alcohol   Drug use: No   Sexual activity: Yes    Partners: Female  Other Topics Concern   Not on file  Social History Narrative   HSG, [employer]  - BA business. Married '76  2 sons - '77, '80. Marriage in good health. 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    Social Drivers of Health  Financial Resource Strain: Low Risk  (03/15/2024)   Overall Financial Resource Strain (CARDIA)    Difficulty of Paying Living Expenses: Not hard at all  Food Insecurity: No Food Insecurity (03/15/2024)   Hunger Vital Sign    Worried About Running Out of Food in the Last Year: Never true    Ran Out of Food in the Last Year: Never true  Transportation Needs: No Transportation Needs (03/15/2024)   PRAPARE - Administrator, Civil Service (Medical): No    Lack of Transportation (Non-Medical): No  Physical Activity: Sufficiently Active (03/15/2024)   Exercise Vital Sign    Days of Exercise per Week: 3 days    Minutes of Exercise per Session: 60 min  Stress: No Stress Concern Present (03/15/2024)   [employer] of Occupational Health - Occupational Stress Questionnaire    Feeling of Stress : Not at all  Social Connections: Socially Integrated (03/15/2024)   Social Connection and Isolation Panel [NHANES]    Frequency of Communication with Friends and Family: More than three times a week    Frequency of Social Gatherings with Friends and Family: More than three times a week    Attends Religious Services: More than 4 times per year    Active Member of [employer] or Organizations: Yes    Attends Engineer, structural: More than 4 times per year    Marital Status: Married    Tobacco Counseling Counseling given: No    Clinical Intake:  Pre-visit preparation completed: Yes  Pain : No/denies pain     BMI - recorded: 30.81 Nutritional Status: BMI > 30  Obese Nutritional Risks: None Diabetes:  No  Lab Results  Component Value Date   HGBA1C 5.8 05/15/2022   HGBA1C 6.1 11/13/2020   HGBA1C 5.7 02/24/2020     How often do you need to have someone help you when you read instructions, pamphlets, or other written materials from your doctor or pharmacy?: 1 - Never  Interpreter Needed?: No  Information entered by :: Kandy Orris, CMA   Activities of Daily Living     03/15/2024    8:29 AM 03/14/2024    6:10 PM  In your present state of health, do you have any difficulty performing the following activities:  Hearing? 0 0  Vision? 0 0  Difficulty concentrating or making decisions? 0 0  Walking or climbing stairs? 0 0  Dressing or bathing? 0 0  Doing errands, shopping? 0 0  Preparing Food and eating ? N N  Using the Toilet? N N  In the past six months, have you accidently leaked urine? N N  Do you have problems with loss of bowel control? N N  Managing your Medications? N N  Managing your Finances? N N  Housekeeping or managing your Housekeeping? N N    Patient Care Team: Arcadio Knuckles, MD as PCP - General (Internal Medicine) Bridgette Campus, MD as PCP - Cardiology (Cardiology) Corie Diamond, MD as Attending Physician (Ophthalmology)  Indicate any recent Medical Services you may have received from other than [facility] providers in the past year (date may be approximate).     Assessment:   This is a routine wellness examination for [place].  Hearing/Vision screen Hearing Screening - Comments:: Wears hearing aids sometimes Vision Screening - Comments:: Wears rx glasses - up to date with routine eye exams with Dr Danley Dusky   Goals Addressed  This Visit's Progress     Patient Stated (pt-stated)        Patient stated he wants to exercise more and lose weight (about 10lbs).       Depression Screen     03/15/2024    8:25 AM 03/12/2023    9:06 AM 03/24/2022    3:53 PM 06/13/2021    4:10 PM 02/23/2020    4:11 PM 12/09/2018    1:31 PM 09/24/2017    1:24 PM   PHQ 2/9 Scores  PHQ - 2 Score 0 0 0 0 0 0 1  PHQ- 9 Score 0 0         Fall Risk     03/15/2024    8:28 AM 03/14/2024    6:10 PM 03/12/2023    9:06 AM 03/09/2023    1:11 PM 03/24/2022    3:50 PM  Fall Risk   Falls in the past year? 0 0 0 0 0  Number falls in past yr: 0  0  0  Injury with Fall? 0  0 0 0  Risk for fall due to : No Fall Risks  No Fall Risks  No Fall Risks  Follow up Falls prevention discussed;Falls evaluation completed  Falls prevention discussed  Falls evaluation completed    MEDICARE RISK AT HOME:  Medicare Risk at Home Any stairs in or around the home?: No If so, are there any without handrails?: No Home free of loose throw rugs in walkways, pet beds, electrical cords, etc?: Yes Adequate lighting in your home to reduce risk of falls?: Yes Life alert?: No Use of a cane, walker or w/c?: No Grab bars in the bathroom?: No Shower chair or bench in shower?: No Elevated toilet seat or a handicapped toilet?: No  TIMED UP AND GO:  Was the test performed?  No  Cognitive Function: 6CIT completed        03/15/2024    8:28 AM 03/12/2023    9:07 AM 03/24/2022    4:08 PM  6CIT Screen  What Year? 0 points 0 points 0 points  What month? 0 points 0 points 0 points  What time? 0 points 0 points 0 points  Count back from 20 0 points 0 points 0 points  Months in reverse 0 points 0 points 0 points  Repeat phrase 0 points 0 points 0 points  Total Score 0 points 0 points 0 points    Immunizations Immunization History  Administered Date(s) Administered   Fluad Quad(high Dose 65+) 07/24/2019, 08/02/2022   Fluad Trivalent(High Dose 65+) 08/26/2023   Hepatitis A, Adult 03/07/2020, 09/06/2020   Hepb-cpg 03/07/2020, 04/06/2020   Influenza Whole 08/06/2009, 09/02/2010, 09/08/2012   Influenza, High Dose Seasonal PF 07/17/2020   Influenza,inj,Quad PF,6+ Mos 08/06/2014, 07/31/2017, 09/22/2018, 08/11/2019   Influenza-Unspecified 06/29/2015, 07/17/2020, 08/12/2021   PFIZER(Purple  Top)SARS-COV-2 Vaccination 01/19/2020, 02/13/2020, 08/14/2020, 04/06/2021   PNEUMOCOCCAL CONJUGATE-20 08/02/2022   Pfizer Covid-19 Vaccine Bivalent Booster 71yrs & up 08/12/2021   Pneumococcal Conjugate-13 11/13/2020   Td 09/02/2010   Tdap 03/07/2020   Zoster Recombinant(Shingrix ) 12/09/2018, 07/24/2019   Zoster, Live 07/27/2015    Screening Tests Health Maintenance  Topic Date Due   Colonoscopy  02/04/2022   COVID-19 Vaccine (6 - 2024-25 season) 07/12/2023   INFLUENZA VACCINE  06/10/2024   Medicare Annual Wellness (AWV)  03/15/2025   DTaP/Tdap/Td (3 - Td or Tdap) 03/07/2030   Pneumonia Vaccine 15+ Years old  Completed   Hepatitis C Screening  Completed   Zoster Vaccines-  Shingrix   Completed   HPV VACCINES  Aged Out   Meningococcal B Vaccine  Aged Out    Health Maintenance  Health Maintenance Due  Topic Date Due   Colonoscopy  02/04/2022   COVID-19 Vaccine (6 - 2024-25 season) 07/12/2023   Health Maintenance Items Addressed: 03/15/2024   Additional Screening:  Vision Screening: Recommended annual ophthalmology exams for early detection of glaucoma and other disorders of the eye.  Dental Screening: Recommended annual dental exams for proper oral hygiene  Community Resource Referral / Chronic Care Management: CRR required this visit?  No   CCM required this visit?  No     Plan:     I have personally reviewed and noted the following in the patient's chart:   Medical and social history Use of alcohol, tobacco or illicit drugs  Current medications and supplements including opioid prescriptions. Patient is currently taking opioid prescriptions. Information provided to patient regarding non-opioid alternatives. Patient advised to discuss non-opioid treatment plan with their provider. Functional ability and status Nutritional status Physical activity Advanced directives List of other physicians Hospitalizations, surgeries, and ER visits in previous 12  months Vitals Screenings to include cognitive, depression, and falls Referrals and appointments  In addition, I have reviewed and discussed with patient certain preventive protocols, quality metrics, and best practice recommendations. A written personalized care plan for preventive services as well as general preventive health recommendations were provided to patient.     Patria Bookbinder, CMA   03/15/2024   After Visit Summary: (MyChart) Due to this being a telephonic visit, the after visit summary with patients personalized plan was offered to patient via MyChart   Notes: Nothing significant to report at this time.

## 2024-03-15 NOTE — Patient Instructions (Addendum)
 [name] [name] , Thank you for taking time to come for your Medicare Wellness Visit. I appreciate your ongoing commitment to your health goals. Please review the following plan we discussed and let me know if I can assist you in the future.   Referrals/Orders/Follow-Ups/Clinician Recommendations: Aim for 30 minutes of exercise or brisk walking, 6-8 glasses of water, and 5 servings of fruits and vegetables each day.   This is a list of the screening recommended for you and due dates:  Health Maintenance  Topic Date Due   Colon Cancer Screening  02/04/2022   COVID-19 Vaccine (6 - 2024-25 season) 07/12/2023   Flu Shot  06/10/2024   Medicare Annual Wellness Visit  03/15/2025   DTaP/Tdap/Td vaccine (3 - Td or Tdap) 03/07/2030   Pneumonia Vaccine  Completed   Hepatitis C Screening  Completed   Zoster (Shingles) Vaccine  Completed   HPV Vaccine  Aged Out   Meningitis B Vaccine  Aged Out    Advanced directives: (Declined) Advance directive discussed with you today. Even though you declined this today, please call our office should you change your mind, and we can give you the proper paperwork for you to fill out.  Next Medicare Annual Wellness Visit scheduled for next year: Yes   Managing Pain Without Opioids Opioids are strong medicines used to treat moderate to severe pain. For some people, especially those who have long-term (chronic) pain, opioids may not be the best choice for pain management due to: Side effects like nausea, constipation, and sleepiness. The risk of addiction (opioid use disorder). The longer you take opioids, the greater your risk of addiction. Pain that lasts for more than 3 months is called chronic pain. Managing chronic pain usually requires more than one approach and is often provided by a team of health care providers working together (multidisciplinary approach). Pain management may be done at a pain management center or pain clinic. How to manage pain without the use of  opioids Use non-opioid medicines Non-opioid medicines for pain may include: Over-the-counter or prescription non-steroidal anti-inflammatory drugs (NSAIDs). These may be the first medicines used for pain. They work well for muscle and bone pain, and they reduce swelling. Acetaminophen . This over-the-counter medicine may work well for milder pain but not swelling. Antidepressants. These may be used to treat chronic pain. A certain type of antidepressant (tricyclics) is often used. These medicines are given in lower doses for pain than when used for depression. Anticonvulsants. These are usually used to treat seizures but may also reduce nerve (neuropathic) pain. Muscle relaxants. These relieve pain caused by sudden muscle tightening (spasms). You may also use a pain medicine that is applied to the skin as a patch, cream, or gel (topical analgesic), such as a numbing medicine. These may cause fewer side effects than medicines taken by mouth. Do certain therapies as directed Some therapies can help with pain management. They include: Physical therapy. You will do exercises to gain strength and flexibility. A physical therapist may teach you exercises to move and stretch parts of your body that are weak, stiff, or painful. You can learn these exercises at physical therapy visits and practice them at home. Physical therapy may also involve: Massage. Heat wraps or applying heat or cold to affected areas. Electrical signals that interrupt pain signals (transcutaneous electrical nerve stimulation, TENS). Weak lasers that reduce pain and swelling (low-level laser therapy). Signals from your body that help you learn to regulate pain (biofeedback). Occupational therapy. This helps you to  learn ways to function at home and work with less pain. Recreational therapy. This involves trying new activities or hobbies, such as a physical activity or drawing. Mental health therapy, including: Cognitive behavioral  therapy (CBT). This helps you learn coping skills for dealing with pain. Acceptance and commitment therapy (ACT) to change the way you think and react to pain. Relaxation therapies, including muscle relaxation exercises and mindfulness-based stress reduction. Pain management counseling. This may be individual, family, or group counseling.  Receive medical treatments Medical treatments for pain management include: Nerve block injections. These may include a pain blocker and anti-inflammatory medicines. You may have injections: Near the spine to relieve chronic back or neck pain. Into joints to relieve back or joint pain. Into nerve areas that supply a painful area to relieve body pain. Into muscles (trigger point injections) to relieve some painful muscle conditions. A medical device placed near your spine to help block pain signals and relieve nerve pain or chronic back pain (spinal cord stimulation device). Acupuncture. Follow these instructions at home Medicines Take over-the-counter and prescription medicines only as told by your health care provider. If you are taking pain medicine, ask your health care providers about possible side effects to watch out for. Do not drive or use heavy machinery while taking prescription opioid pain medicine. Lifestyle  Do not use drugs or alcohol to reduce pain. If you drink alcohol, limit how much you have to: 0-1 drink a day for women who are not pregnant. 0-2 drinks a day for men. Know how much alcohol is in a drink. In the [place] one drink equals one 12 oz bottle of beer (355 mL), one 5 oz glass of wine (148 mL), or one 1 oz glass of hard liquor (44 mL). Do not use any products that contain nicotine or tobacco. These products include cigarettes, chewing tobacco, and vaping devices, such as e-cigarettes. If you need help quitting, ask your health care provider. Eat a healthy diet and maintain a healthy weight. Poor diet and excess weight may make pain  worse. Eat foods that are high in fiber. These include fresh fruits and vegetables, whole grains, and beans. Limit foods that are high in fat and processed sugars, such as fried and sweet foods. Exercise regularly. Exercise lowers stress and may help relieve pain. Ask your health care provider what activities and exercises are safe for you. If your health care provider approves, join an exercise class that combines movement and stress reduction. Examples include yoga and tai chi. Get enough sleep. Lack of sleep may make pain worse. Lower stress as much as possible. Practice stress reduction techniques as told by your therapist. General instructions Work with all your pain management providers to find the treatments that work best for you. You are an important member of your pain management team. There are many things you can do to reduce pain on your own. Consider joining an online or in-person support group for people who have chronic pain. Keep all follow-up visits. This is important. Where to find more information You can find more information about managing pain without opioids from: American Academy of Pain Medicine: painmed.org Institute for Chronic Pain: instituteforchronicpain.org American Chronic Pain Association: theacpa.org Contact a health care provider if: You have side effects from pain medicine. Your pain gets worse or does not get better with treatments or home therapy. You are struggling with anxiety or depression. Summary Many types of pain can be managed without opioids. Chronic pain may respond better to  pain management without opioids. Pain is best managed when you and a team of health care providers work together. Pain management without opioids may include non-opioid medicines, medical treatments, physical therapy, mental health therapy, and lifestyle changes. Tell your health care providers if your pain gets worse or is not being managed well enough. This information  is not intended to replace advice given to you by your health care provider. Make sure you discuss any questions you have with your health care provider. Document Revised: 02/06/2021 Document Reviewed: 02/06/2021 Elsevier Patient Education  2024 [employer].

## 2024-03-16 NOTE — Telephone Encounter (Signed)
 Patient has been made aware that he needs to be seen for further refills.

## 2024-03-17 MED ORDER — PANTOPRAZOLE SODIUM 40 MG PO TBEC
40.0000 mg | DELAYED_RELEASE_TABLET | Freq: Every day | ORAL | 0 refills | Status: DC
Start: 2024-03-17 — End: 2024-06-15

## 2024-03-27 ENCOUNTER — Other Ambulatory Visit: Payer: Self-pay | Admitting: Internal Medicine

## 2024-03-27 DIAGNOSIS — F5101 Primary insomnia: Secondary | ICD-10-CM

## 2024-03-27 DIAGNOSIS — M19041 Primary osteoarthritis, right hand: Secondary | ICD-10-CM

## 2024-03-27 DIAGNOSIS — M51369 Other intervertebral disc degeneration, lumbar region without mention of lumbar back pain or lower extremity pain: Secondary | ICD-10-CM

## 2024-03-27 DIAGNOSIS — M170 Bilateral primary osteoarthritis of knee: Secondary | ICD-10-CM

## 2024-04-07 ENCOUNTER — Encounter: Payer: Self-pay | Admitting: Internal Medicine

## 2024-04-08 ENCOUNTER — Other Ambulatory Visit: Payer: Self-pay | Admitting: Internal Medicine

## 2024-04-08 ENCOUNTER — Other Ambulatory Visit: Payer: Self-pay

## 2024-04-08 ENCOUNTER — Emergency Department (HOSPITAL_BASED_OUTPATIENT_CLINIC_OR_DEPARTMENT_OTHER)
Admission: EM | Admit: 2024-04-08 | Discharge: 2024-04-08 | Disposition: A | Discharge status: Home / Self Care | Attending: Emergency Medicine | Admitting: Emergency Medicine

## 2024-04-08 ENCOUNTER — Encounter (HOSPITAL_BASED_OUTPATIENT_CLINIC_OR_DEPARTMENT_OTHER): Payer: Self-pay

## 2024-04-08 ENCOUNTER — Emergency Department (HOSPITAL_BASED_OUTPATIENT_CLINIC_OR_DEPARTMENT_OTHER)

## 2024-04-08 DIAGNOSIS — S2242XA Multiple fractures of ribs, left side, initial encounter for closed fracture: Secondary | ICD-10-CM | POA: Diagnosis not present

## 2024-04-08 DIAGNOSIS — S29001A Unspecified injury of muscle and tendon of front wall of thorax, initial encounter: Secondary | ICD-10-CM | POA: Diagnosis present

## 2024-04-08 DIAGNOSIS — Z7982 Long term (current) use of aspirin: Secondary | ICD-10-CM | POA: Insufficient documentation

## 2024-04-08 DIAGNOSIS — E785 Hyperlipidemia, unspecified: Secondary | ICD-10-CM

## 2024-04-08 DIAGNOSIS — I700 Atherosclerosis of aorta: Secondary | ICD-10-CM | POA: Diagnosis not present

## 2024-04-08 DIAGNOSIS — W01198A Fall on same level from slipping, tripping and stumbling with subsequent striking against other object, initial encounter: Secondary | ICD-10-CM | POA: Diagnosis not present

## 2024-04-08 MED ORDER — OXYCODONE-ACETAMINOPHEN 5-325 MG PO TABS: 1.0000 | ORAL_TABLET | Freq: Four times a day (QID) | ORAL | 0 refills | Status: DC | PRN

## 2024-04-08 NOTE — ED Provider Notes (Signed)
 [place] [facility] AT [facility] Provider Note   CSN: [id number] Arrival date & time: 04/08/24  1914     History  Chief Complaint  Patient presents with   [name] [name] is a 68 y.o. male.  Patient slipped in the shower yesterday, hitting his back against the shower bench.. He did not hit his head. Denies neck pain, extremity pain, abdominal injury, chest pain. He reports breathing and moving exacerbate the pain but he does not feel short of breath. No cough. He is not anticoagulated.   The history is provided by the patient. No language interpreter was used.  Fall       Home Medications Prior to Admission medications   Medication Sig Start Date End Date Taking? Authorizing Provider  oxyCODONE -acetaminophen  (PERCOCET/ROXICET) 5-325 MG tablet Take 1 tablet by mouth every 6 (six) hours as needed for severe pain (pain score 7-10). 04/08/24  Yes Malayasia Mirkin, Clovis Dar, PA-C  acetaminophen  (TYLENOL ) 650 MG CR tablet Take 650 mg by mouth every 8 (eight) hours as needed for pain.    [provider]  aspirin  EC 81 MG tablet Take 1 tablet (81 mg total) by mouth daily. Swallow whole. 07/03/22   Bridgette Campus, MD  cholecalciferol  (VITAMIN D3) 25 MCG (1000 UNIT) tablet Take 1,000 Units by mouth daily.    [provider]  Multiple Vitamin (MULTIVITAMIN) tablet Take 1 tablet by mouth daily.    [provider]  olmesartan  (BENICAR ) 40 MG tablet Take 1 tablet (40 mg total) by mouth daily. 08/27/23   Arcadio Knuckles, MD  pantoprazole  (PROTONIX ) 40 MG tablet Take 1 tablet (40 mg total) by mouth daily. 03/17/24   Arcadio Knuckles, MD  rosuvastatin  (CRESTOR ) 10 MG tablet TAKE 1 TABLET BY MOUTH EVERY DAY 01/13/24   Arcadio Knuckles, MD  tadalafil  (CIALIS ) 20 MG tablet Take 1 tablet (20 mg total) by mouth daily as needed for erectile dysfunction. 02/12/21   Arcadio Knuckles, MD  traMADol  (ULTRAM ) 50 MG tablet TAKE 1 TABLET BY MOUTH EVERY 8 HOURS AS NEEDED.  03/30/24   Webb, Padonda B, FNP  Turmeric 450 MG CAPS Take 1 capsule by mouth 2 (two) times daily with a meal. 02/13/15   Arcadio Knuckles, MD  zinc  gluconate ([facility] ZINC  GLUCONATE) 50 MG tablet Take 1 tablet (50 mg total) by mouth daily. 08/26/23   Arcadio Knuckles, MD  zolpidem  (AMBIEN  CR) 12.5 MG CR tablet TAKE 1 TABLET (12.5 MG TOTAL) BY MOUTH AT BEDTIME AS NEEDED. FOR SLEEP 03/30/24   Webb, Padonda B, FNP      Allergies    Penicillins and Codeine    Review of Systems   Review of Systems  Physical Exam Updated Vital Signs BP (!) 145/95 (BP Location: Left Arm)   Pulse 78   Temp 97.7 F (36.5 C) (Oral)   Resp (!) 23   Ht 6\' 2"  (1.88 m)   Wt 99.8 kg   SpO2 97%   BMI 28.25 kg/m  Physical Exam Constitutional:      Appearance: He is well-developed.  HENT:     Head: Normocephalic.  Cardiovascular:     Rate and Rhythm: Normal rate and regular rhythm.     Heart sounds: No murmur heard. Pulmonary:     Effort: Pulmonary effort is normal.     Breath sounds: Normal breath sounds. No wheezing, rhonchi or rales.  Abdominal:     General: Bowel sounds are normal.  Palpations: Abdomen is soft.     Tenderness: There is no abdominal tenderness. There is no guarding or rebound.  Musculoskeletal:        General: Normal range of motion.     Cervical back: Normal range of motion and neck supple.       Back:     Comments: Bruising over left mid-back.   Skin:    General: Skin is warm and dry.  Neurological:     General: No focal deficit present.     Mental Status: He is alert and oriented to person, place, and time.     ED Results / Procedures / Treatments   Labs (all labs ordered are listed, but only abnormal results are displayed) Labs Reviewed - No data to display  EKG None  Radiology DG Ribs Unilateral W/Chest Left Result Date: 04/08/2024 CLINICAL DATA:  Left flank bruising following a fall last night. EXAM: LEFT RIBS AND CHEST - 3+ VIEW COMPARISON:  Chest radiographs dated  12/24/2020 and chest CTA dated 03/12/2023. FINDINGS: The heart remains normal in size in the aorta remains mildly tortuous and minimally calcified. The lungs are clear and hyperexpanded with mild peribronchial thickening and accentuation of the interstitial markings, without significant change. A gastric band is in satisfactory position. Multiple old, healed left rib fractures as well as mildly displaced acute 9th and 10th lateral rib fractures. No pneumothorax. Moderate thoracic and lumbar spine degenerative changes. IMPRESSION: 1. Mildly displaced acute left 9th and 10th lateral rib fractures. No pneumothorax. 2. Stable mild changes of COPD and chronic bronchitis. Electronically Signed   By: Catherin Closs M.D.   On: 04/08/2024 11:01    Procedures Procedures    Medications Ordered in ED Medications - No data to display  ED Course/ Medical Decision Making/ A&P Clinical Course as of 04/08/24 1118  Fri Apr 08, 2024  0913 Mechanical fall in the shower yesterday, hit back against the shower bench. Has bruising to back. Denies hematuria. Good air movement to all fields. Rib imaging pending.  [SU]  1117 Mildly displaced left 9th and 10th ribs. No PTX.   Patient is well appearing. Normal Oxygenation. Discussed use of incentive spirometer and return precautions. Stable for discharge.  [SU]    Clinical Course User Index [SU] Mandy Second, PA-C                                 Medical Decision Making Amount and/or Complexity of Data Reviewed Radiology: ordered.  Risk Prescription drug management.           Final Clinical Impression(s) / ED Diagnoses Final diagnoses:  Closed fracture of multiple ribs of left side, initial encounter    Rx / DC Orders ED Discharge Orders          Ordered    Incentive spirometry RT  Status:  Canceled        04/08/24 1114    oxyCODONE -acetaminophen  (PERCOCET/ROXICET) 5-325 MG tablet  Every 6 hours PRN        04/08/24 1115               Mandy Second, PA-C 04/08/24 1119    Rafael Bun A, DO 04/12/24 2009

## 2024-04-08 NOTE — ED Triage Notes (Signed)
 Pt reports slipping in shower last night landing on a bench behind tub .Pain and bruising noted to left flank.  No LOC fell last night. Denies SOB just severe pain with moving. No difficulty breathing

## 2024-04-08 NOTE — Discharge Instructions (Signed)
 Use the Incentive Spirometer every hour for lung care. Take Percocet as directed for pain. You will not need to take Tramadol  at the same time if this is an ongoing prescription for you.   Follow up with your doctor in one week for recheck and further pain management if needed. REturn to the ED with any severe pain, difficulty breathing, high fever or new concern.

## 2024-05-01 ENCOUNTER — Other Ambulatory Visit: Payer: Self-pay | Admitting: Internal Medicine

## 2024-05-01 DIAGNOSIS — E785 Hyperlipidemia, unspecified: Secondary | ICD-10-CM

## 2024-05-18 ENCOUNTER — Telehealth: Payer: Self-pay | Admitting: Internal Medicine

## 2024-05-18 NOTE — Telephone Encounter (Signed)
 done

## 2024-05-27 ENCOUNTER — Other Ambulatory Visit: Payer: Self-pay | Admitting: Internal Medicine

## 2024-05-27 DIAGNOSIS — I7121 Aneurysm of the ascending aorta, without rupture: Secondary | ICD-10-CM

## 2024-06-13 ENCOUNTER — Other Ambulatory Visit: Payer: Self-pay | Admitting: Internal Medicine

## 2024-06-13 DIAGNOSIS — K2101 Gastro-esophageal reflux disease with esophagitis, with bleeding: Secondary | ICD-10-CM

## 2024-06-15 NOTE — Telephone Encounter (Signed)
 Last OV 08/26/23, MWV 03/15/24 Next OV 08/15/24  Last refill 03/17/24 Qty #90/0

## 2024-06-17 ENCOUNTER — Other Ambulatory Visit: Payer: Self-pay | Admitting: Family

## 2024-06-17 ENCOUNTER — Other Ambulatory Visit: Payer: Self-pay | Admitting: Internal Medicine

## 2024-06-17 DIAGNOSIS — F5101 Primary insomnia: Secondary | ICD-10-CM

## 2024-06-17 DIAGNOSIS — I10 Essential (primary) hypertension: Secondary | ICD-10-CM

## 2024-08-01 ENCOUNTER — Other Ambulatory Visit: Payer: Self-pay

## 2024-08-01 DIAGNOSIS — F5101 Primary insomnia: Secondary | ICD-10-CM

## 2024-08-01 NOTE — Telephone Encounter (Signed)
 Per Dr. Joshua, pt needs to be seen for appointment for any refill.

## 2024-08-01 NOTE — Addendum Note (Signed)
 Addended by: Eron Staat E on: 08/01/2024 01:30 PM   Modules accepted: Orders

## 2024-08-01 NOTE — Telephone Encounter (Signed)
 Pended refill for Dr. Joshua, looks like it was being sent to Padonda Webb instead

## 2024-08-01 NOTE — Telephone Encounter (Signed)
 Copied from CRM #[id number]. Topic: Clinical - Prescription Issue >> Aug 01, 2024 12:25 PM Robinson H wrote: Reason for CRM: Patient is calling stating pharmacy has been waiting on office to approve his refill for the zolpidem  (AMBIEN  CR) 12.5 MG CR tablet, no pending refills in system and patient states he's out of the medication.  [name] [name] [phone]

## 2024-08-15 ENCOUNTER — Encounter: Payer: Self-pay | Admitting: Internal Medicine

## 2024-08-15 ENCOUNTER — Ambulatory Visit: Admitting: Internal Medicine

## 2024-08-15 VITALS — BP 134/84 | HR 78 | Temp 97.7°F | Resp 16 | Ht 74.0 in | Wt 249.0 lb

## 2024-08-15 DIAGNOSIS — F5101 Primary insomnia: Secondary | ICD-10-CM

## 2024-08-15 DIAGNOSIS — E519 Thiamine deficiency, unspecified: Secondary | ICD-10-CM

## 2024-08-15 DIAGNOSIS — Z0000 Encounter for general adult medical examination without abnormal findings: Secondary | ICD-10-CM | POA: Diagnosis not present

## 2024-08-15 DIAGNOSIS — E559 Vitamin D deficiency, unspecified: Secondary | ICD-10-CM

## 2024-08-15 DIAGNOSIS — Z23 Encounter for immunization: Secondary | ICD-10-CM | POA: Diagnosis not present

## 2024-08-15 DIAGNOSIS — D508 Other iron deficiency anemias: Secondary | ICD-10-CM | POA: Diagnosis not present

## 2024-08-15 DIAGNOSIS — M170 Bilateral primary osteoarthritis of knee: Secondary | ICD-10-CM

## 2024-08-15 DIAGNOSIS — E039 Hypothyroidism, unspecified: Secondary | ICD-10-CM

## 2024-08-15 DIAGNOSIS — E785 Hyperlipidemia, unspecified: Secondary | ICD-10-CM

## 2024-08-15 DIAGNOSIS — R7303 Prediabetes: Secondary | ICD-10-CM | POA: Diagnosis not present

## 2024-08-15 DIAGNOSIS — Z0001 Encounter for general adult medical examination with abnormal findings: Secondary | ICD-10-CM

## 2024-08-15 DIAGNOSIS — I10 Essential (primary) hypertension: Secondary | ICD-10-CM

## 2024-08-15 DIAGNOSIS — I7121 Aneurysm of the ascending aorta, without rupture: Secondary | ICD-10-CM

## 2024-08-15 DIAGNOSIS — N400 Enlarged prostate without lower urinary tract symptoms: Secondary | ICD-10-CM | POA: Diagnosis not present

## 2024-08-15 DIAGNOSIS — E60 Dietary zinc deficiency: Secondary | ICD-10-CM

## 2024-08-15 DIAGNOSIS — K635 Polyp of colon: Secondary | ICD-10-CM | POA: Insufficient documentation

## 2024-08-15 DIAGNOSIS — K2100 Gastro-esophageal reflux disease with esophagitis, without bleeding: Secondary | ICD-10-CM

## 2024-08-15 LAB — BASIC METABOLIC PANEL WITH GFR
BUN: 18 mg/dL (ref 6–23)
CO2: 22 meq/L (ref 19–32)
Calcium: 9.2 mg/dL (ref 8.4–10.5)
Chloride: 107 meq/L (ref 96–112)
Creatinine, Ser: 0.9 mg/dL (ref 0.40–1.50)
GFR: 87.37 mL/min (ref >60.00)
Glucose, Bld: 106 mg/dL — ABNORMAL HIGH (ref 70–99)
Potassium: 4.5 meq/L (ref 3.5–5.1)
Sodium: 143 meq/L (ref 135–145)

## 2024-08-15 LAB — CBC WITH DIFFERENTIAL/PLATELET
Basophils Absolute: 0 K/uL (ref 0.0–0.1)
Basophils Relative: 0.2 % (ref 0.0–3.0)
Eosinophils Absolute: 0.1 K/uL (ref 0.0–0.7)
Eosinophils Relative: 1.8 % (ref 0.0–5.0)
HCT: 42 % (ref 39.0–52.0)
Hemoglobin: 13.9 g/dL (ref 13.0–17.0)
Lymphocytes Relative: 28.1 % (ref 12.0–46.0)
Lymphs Abs: 2.1 K/uL (ref 0.7–4.0)
MCHC: 33 g/dL (ref 30.0–36.0)
MCV: 90 fl (ref 78.0–100.0)
Monocytes Absolute: 0.5 K/uL (ref 0.1–1.0)
Monocytes Relative: 6.8 % (ref 3.0–12.0)
Neutro Abs: 4.7 K/uL (ref 1.4–7.7)
Neutrophils Relative %: 63.1 % (ref 43.0–77.0)
Platelets: 165 K/uL (ref 150.0–400.0)
RBC: 4.67 Mil/uL (ref 4.22–5.81)
RDW: 12.6 % (ref 11.5–15.5)
WBC: 7.4 K/uL (ref 4.0–10.5)

## 2024-08-15 LAB — LIPID PANEL
Cholesterol: 138 mg/dL (ref 0–200)
HDL: 41.1 mg/dL (ref >39.00)
LDL Cholesterol: 52 mg/dL (ref 0–99)
NonHDL: 96.64
Total CHOL/HDL Ratio: 3
Triglycerides: 224 mg/dL — ABNORMAL HIGH (ref 0.0–149.0)
VLDL: 44.8 mg/dL — ABNORMAL HIGH (ref 0.0–40.0)

## 2024-08-15 LAB — URINALYSIS, ROUTINE W REFLEX MICROSCOPIC
Hgb urine dipstick: NEGATIVE
Ketones, ur: NEGATIVE
Leukocytes,Ua: NEGATIVE
Nitrite: NEGATIVE
Specific Gravity, Urine: 1.025 (ref 1.000–1.030)
Total Protein, Urine: NEGATIVE
Urine Glucose: NEGATIVE
Urobilinogen, UA: 0.2 (ref 0.0–1.0)
pH: 5.5 (ref 5.0–8.0)

## 2024-08-15 LAB — HEPATIC FUNCTION PANEL
ALT: 21 U/L (ref 0–53)
AST: 22 U/L (ref 0–37)
Albumin: 4.4 g/dL (ref 3.5–5.2)
Alkaline Phosphatase: 76 U/L (ref 39–117)
Bilirubin, Direct: 0.1 mg/dL (ref 0.0–0.3)
Total Bilirubin: 0.6 mg/dL (ref 0.2–1.2)
Total Protein: 7 g/dL (ref 6.0–8.3)

## 2024-08-15 LAB — HEMOGLOBIN A1C: Hgb A1c MFr Bld: 6 % (ref 4.6–6.5)

## 2024-08-15 LAB — IBC + FERRITIN
Ferritin: 52.7 ng/mL (ref 22.0–322.0)
Iron: 90 ug/dL (ref 42–165)
Saturation Ratios: 28.6 % (ref 20.0–50.0)
TIBC: 315 ug/dL (ref 250.0–450.0)
Transferrin: 225 mg/dL (ref 212.0–360.0)

## 2024-08-15 LAB — TSH: TSH: 1.69 u[IU]/mL (ref 0.35–5.50)

## 2024-08-15 LAB — VITAMIN D 25 HYDROXY (VIT D DEFICIENCY, FRACTURES): VITD: 45.04 ng/mL (ref 30.00–100.00)

## 2024-08-15 LAB — PSA: PSA: 0.43 ng/mL (ref 0.10–4.00)

## 2024-08-15 MED ORDER — COVID-19 MRNA VAC-TRIS(PFIZER) 30 MCG/0.3ML IM SUSY: 0.3000 mL | PREFILLED_SYRINGE | Freq: Once | INTRAMUSCULAR | 0 refills | Status: AC

## 2024-08-15 MED ORDER — ZOLPIDEM TARTRATE ER 12.5 MG PO TBCR: 12.5000 mg | EXTENDED_RELEASE_TABLET | Freq: Every evening | ORAL | 0 refills | Status: AC | PRN

## 2024-08-15 NOTE — Progress Notes (Signed)
 "  Subjective:  Patient ID: [name] [name], male    DOB: 1956-06-19  Age: 68 y.o. MRN: [id number]  CC: Annual Exam, Hypertension, Hypothyroidism, and Hyperlipidemia   HPI TORION HULGAN presents for a CPX and f/up -  Discussed the use of AI scribe software for clinical note transcription with the patient, who gave verbal consent to proceed.  History of Present Illness [name] [name] is a 68 year old male who presents for a routine follow-up visit.  He feels well and remains active, working 60 to 80 hours a week without experiencing any chest pain, shortness of breath, dizziness, or lightheadedness. No symptoms of high blood pressure are present.  He has a history of colonoscopy performed approximately five years ago at [facility], during which a few polyps were removed. He does not recall the exact number but believes it was around three. No current symptoms of blood in the stool.  He takes a prescribed medication for heartburn and indigestion, which is effective. No current issues with heartburn, indigestion, or trouble swallowing.  He feels his thyroid  medication dose is adequate and reports feeling good overall.   He is currently out of his sleep medication and uses [facility] on [address] for his prescriptions. He takes a multivitamin for those over 50, vitamin D3, iron, krill oil, turmeric for anti-inflammation, and a medication for arthritis pain in the morning.     Outpatient Medications Prior to Visit  Medication Sig Dispense Refill   acetaminophen  (TYLENOL ) 650 MG CR tablet Take 650 mg by mouth every 8 (eight) hours as needed for pain.     aspirin  EC 81 MG tablet Take 1 tablet (81 mg total) by mouth daily. Swallow whole. 90 tablet 3   cholecalciferol  (VITAMIN D3) 25 MCG (1000 UNIT) tablet Take 1,000 Units by mouth daily.     Multiple Vitamin (MULTIVITAMIN) tablet Take 1 tablet by mouth daily.     olmesartan  (BENICAR ) 40 MG tablet TAKE 1 TABLET BY MOUTH EVERY DAY 90  tablet 0   pantoprazole  (PROTONIX ) 40 MG tablet TAKE 1 TABLET BY MOUTH EVERY DAY 90 tablet 1   rosuvastatin  (CRESTOR ) 10 MG tablet TAKE 1 TABLET BY MOUTH EVERY DAY 90 tablet 1   tadalafil  (CIALIS ) 20 MG tablet Take 1 tablet (20 mg total) by mouth daily as needed for erectile dysfunction. 20 tablet 5   traMADol  (ULTRAM ) 50 MG tablet TAKE 1 TABLET BY MOUTH EVERY 8 HOURS AS NEEDED. 90 tablet 0   Turmeric 450 MG CAPS Take 1 capsule by mouth 2 (two) times daily with a meal. 180 capsule 3   zinc  gluconate ([facility] ZINC  GLUCONATE) 50 MG tablet Take 1 tablet (50 mg total) by mouth daily. 90 tablet 1   zolpidem  (AMBIEN  CR) 12.5 MG CR tablet TAKE 1 TABLET (12.5 MG TOTAL) BY MOUTH AT BEDTIME AS NEEDED. FOR SLEEP 90 tablet 0   oxyCODONE -acetaminophen  (PERCOCET/ROXICET) 5-325 MG tablet Take 1 tablet by mouth every 6 (six) hours as needed for severe pain (pain score 7-10). (Patient not taking: Reported on 08/15/2024) 15 tablet 0   No facility-administered medications prior to visit.    ROS Review of Systems  Constitutional:  Positive for unexpected weight change (wt gain). Negative for appetite change, chills, diaphoresis and fatigue.  HENT: Negative.  Negative for sore throat and trouble swallowing.   Eyes: Negative.   Respiratory: Negative.  Negative for cough, chest tightness, shortness of breath and wheezing.   Cardiovascular:  Negative for chest pain,  palpitations and leg swelling.  Gastrointestinal: Negative.  Negative for abdominal pain, blood in stool, constipation, diarrhea, nausea and vomiting.  Endocrine: Negative.   Genitourinary: Negative.  Negative for difficulty urinating, hematuria, scrotal swelling and testicular pain.  Musculoskeletal:  Positive for arthralgias. Negative for gait problem and myalgias.  Neurological:  Negative for dizziness, weakness, light-headedness and headaches.  Hematological:  Negative for adenopathy. Does not bruise/bleed easily.  Psychiatric/Behavioral:  Positive for  confusion, decreased concentration and sleep disturbance. The patient is not nervous/anxious.     Objective:  BP 134/84 (BP Location: Left Arm, Patient Position: Sitting, Cuff Size: Normal)   Pulse 78   Temp 97.7 F (36.5 C) (Oral)   Resp 16   Ht 6' 2 (1.88 m)   Wt 249 lb (112.9 kg)   SpO2 97%   BMI 31.97 kg/m   BP Readings from Last 3 Encounters:  08/15/24 134/84  04/08/24 (!) 145/95  08/26/23 (!) 158/94    Wt Readings from Last 3 Encounters:  08/15/24 249 lb (112.9 kg)  04/08/24 220 lb (99.8 kg)  03/15/24 240 lb (108.9 kg)    Physical Exam Vitals reviewed. Exam conducted with a chaperone present.  Constitutional:      Appearance: Normal appearance.  HENT:     Mouth/Throat:     Mouth: Mucous membranes are moist.  Eyes:     General: No scleral icterus.    Conjunctiva/sclera: Conjunctivae normal.  Cardiovascular:     Rate and Rhythm: Normal rate and regular rhythm.     Heart sounds: Normal heart sounds, S1 normal and S2 normal. No murmur heard.    Comments: EKG--- NSR, 74 bpm No LVH, Q waves, or ST/T wave changes  Pulmonary:     Effort: Pulmonary effort is normal.     Breath sounds: No stridor. No wheezing, rhonchi or rales.  Abdominal:     General: Abdomen is flat.     Palpations: There is no mass.     Tenderness: There is no abdominal tenderness. There is no guarding.     Hernia: No hernia is present. There is no hernia in the left inguinal area or right inguinal area.  Genitourinary:    Pubic Area: No rash.      Penis: Normal and uncircumcised.      Testes: Normal.     Epididymis:     Right: Normal.     Left: Normal.     Prostate: Enlarged. Not tender and no nodules present.     Rectum: Normal. Guaiac result negative. No mass, tenderness, anal fissure, external hemorrhoid or internal hemorrhoid. Normal anal tone.  Musculoskeletal:        General: No swelling or deformity.     Cervical back: Neck supple.     Right lower leg: No edema.     Left lower  leg: No edema.  Lymphadenopathy:     Cervical: No cervical adenopathy.     Lower Body: No right inguinal adenopathy. No left inguinal adenopathy.  Skin:    General: Skin is warm and dry.     Findings: No rash.  Neurological:     General: No focal deficit present.     Mental Status: He is alert. Mental status is at baseline.  Psychiatric:        Mood and Affect: Mood normal.        Behavior: Behavior normal.     Lab Results  Component Value Date   WBC 7.4 08/15/2024   HGB 13.9 08/15/2024  HCT 42.0 08/15/2024   PLT 165.0 08/15/2024   GLUCOSE 106 (H) 08/15/2024   CHOL 138 08/15/2024   TRIG 224.0 (H) 08/15/2024   HDL 41.10 08/15/2024   LDLDIRECT 85.0 06/13/2021   LDLCALC 52 08/15/2024   ALT 21 08/15/2024   AST 22 08/15/2024   NA 143 08/15/2024   K 4.5 08/15/2024   CL 107 08/15/2024   CREATININE 0.90 08/15/2024   BUN 18 08/15/2024   CO2 22 08/15/2024   TSH 1.69 08/15/2024   PSA 0.43 08/15/2024   INR 1.0 11/13/2020   HGBA1C 6.0 08/15/2024   MICROALBUR 4.2 (H) 12/30/2007    DG Ribs Unilateral W/Chest Left Result Date: 04/08/2024 CLINICAL DATA:  Left flank bruising following a fall last night. EXAM: LEFT RIBS AND CHEST - 3+ VIEW COMPARISON:  Chest radiographs dated 12/24/2020 and chest CTA dated 03/12/2023. FINDINGS: The heart remains normal in size in the aorta remains mildly tortuous and minimally calcified. The lungs are clear and hyperexpanded with mild peribronchial thickening and accentuation of the interstitial markings, without significant change. A gastric band is in satisfactory position. Multiple old, healed left rib fractures as well as mildly displaced acute 9th and 10th lateral rib fractures. No pneumothorax. Moderate thoracic and lumbar spine degenerative changes. IMPRESSION: 1. Mildly displaced acute left 9th and 10th lateral rib fractures. No pneumothorax. 2. Stable mild changes of COPD and chronic bronchitis. Electronically Signed   By: Elspeth Bathe M.D.   On:  04/08/2024 11:01    Assessment & Plan:  Vitamin D  deficiency -     VITAMIN D  25 Hydroxy (Vit-D Deficiency, Fractures); Future  Hyperlipidemia with target LDL less than 100 -     Hepatic function panel; Future -     Lipid panel; Future  Acquired hypothyroidism -     TSH; Future  Prediabetes -     Hemoglobin A1c; Future -     Basic metabolic panel with GFR; Future  Benign prostatic hyperplasia without lower urinary tract symptoms -     PSA; Future -     Urinalysis, Routine w reflex microscopic; Future  Zinc  deficiency -     CBC with Differential/Platelet; Future -     Zinc ; Future  Aneurysm of ascending aorta without rupture -     MR ANGIO CHEST W WO CONTRAST; Future  Thiamine  deficiency -     CBC with Differential/Platelet; Future -     Vitamin B1; Future  Gastroesophageal reflux disease with esophagitis without hemorrhage -     CBC with Differential/Platelet; Future  Iron deficiency anemia due to dietary causes -     CBC with Differential/Platelet; Future -     IBC + Ferritin; Future  Essential hypertension -     Urinalysis, Routine w reflex microscopic; Future -     CBC with Differential/Platelet; Future -     EKG 12-Lead  Polyp of colon, unspecified part of colon, unspecified type -     Ambulatory referral to Gastroenterology  Need for immunization against influenza -     Flu vaccine HIGH DOSE PF(Fluzone Trivalent)  Primary insomnia -     Zolpidem  Tartrate ER; Take 1 tablet (12.5 mg total) by mouth at bedtime as needed. for sleep  Dispense: 90 tablet; Refill: 0  Encounter for general adult medical examination with abnormal findings- Exam completed, labs reviewed, vaccines reviewed and updated, cancer screenings addressed, pt ed material was given.   Primary osteoarthritis of both knees  Other orders -     COVID-19  mRNA Vac-TriS(Pfizer); Inject 0.3 mLs into the muscle once for 1 dose.  Dispense: 0.3 mL; Refill: 0 -     EKG     Follow-up: Return in  about 6 months (around 02/13/2025).  Debby Molt, MD "

## 2024-08-15 NOTE — Patient Instructions (Signed)
 Health Maintenance, Male  Adopting a healthy lifestyle and getting preventive care are important in promoting health and wellness. Ask your health care provider about:  The right schedule for you to have regular tests and exams.  Things you can do on your own to prevent diseases and keep yourself healthy.  What should I know about diet, weight, and exercise?  Eat a healthy diet    Eat a diet that includes plenty of vegetables, fruits, low-fat dairy products, and lean protein.  Do not eat a lot of foods that are high in solid fats, added sugars, or sodium.  Maintain a healthy weight  Body mass index (BMI) is a measurement that can be used to identify possible weight problems. It estimates body fat based on height and weight. Your health care provider can help determine your BMI and help you achieve or maintain a healthy weight.  Get regular exercise  Get regular exercise. This is one of the most important things you can do for your health. Most adults should:  Exercise for at least 150 minutes each week. The exercise should increase your heart rate and make you sweat (moderate-intensity exercise).  Do strengthening exercises at least twice a week. This is in addition to the moderate-intensity exercise.  Spend less time sitting. Even light physical activity can be beneficial.  Watch cholesterol and blood lipids  Have your blood tested for lipids and cholesterol at 69 years of age, then have this test every 5 years.  You may need to have your cholesterol levels checked more often if:  Your lipid or cholesterol levels are high.  You are older than 69 years of age.  You are at high risk for heart disease.  What should I know about cancer screening?  Many types of cancers can be detected early and may often be prevented. Depending on your health history and family history, you may need to have cancer screening at various ages. This may include screening for:  Colorectal cancer.  Prostate cancer.  Skin cancer.  Lung  cancer.  What should I know about heart disease, diabetes, and high blood pressure?  Blood pressure and heart disease  High blood pressure causes heart disease and increases the risk of stroke. This is more likely to develop in people who have high blood pressure readings or are overweight.  Talk with your health care provider about your target blood pressure readings.  Have your blood pressure checked:  Every 3-5 years if you are 24-52 years of age.  Every year if you are 3 years old or older.  If you are between the ages of 60 and 72 and are a current or former smoker, ask your health care provider if you should have a one-time screening for abdominal aortic aneurysm (AAA).  Diabetes  Have regular diabetes screenings. This checks your fasting blood sugar level. Have the screening done:  Once every three years after age 66 if you are at a normal weight and have a low risk for diabetes.  More often and at a younger age if you are overweight or have a high risk for diabetes.  What should I know about preventing infection?  Hepatitis B  If you have a higher risk for hepatitis B, you should be screened for this virus. Talk with your health care provider to find out if you are at risk for hepatitis B infection.  Hepatitis C  Blood testing is recommended for:  Everyone born from [birth year] through 1966.  Anyone  with known risk factors for hepatitis C.  Sexually transmitted infections (STIs)  You should be screened each year for STIs, including gonorrhea and chlamydia, if:  You are sexually active and are younger than 69 years of age.  You are older than 69 years of age and your health care provider tells you that you are at risk for this type of infection.  Your sexual activity has changed since you were last screened, and you are at increased risk for chlamydia or gonorrhea. Ask your health care provider if you are at risk.  Ask your health care provider about whether you are at high risk for HIV. Your health care provider  may recommend a prescription medicine to help prevent HIV infection. If you choose to take medicine to prevent HIV, you should first get tested for HIV. You should then be tested every 3 months for as long as you are taking the medicine.  Follow these instructions at home:  Alcohol use  Do not drink alcohol if your health care provider tells you not to drink.  If you drink alcohol:  Limit how much you have to 0-2 drinks a day.  Know how much alcohol is in your drink. In the [place] one drink equals one 12 oz bottle of beer (355 mL), one 5 oz glass of wine (148 mL), or one 1 oz glass of hard liquor (44 mL).  Lifestyle  Do not use any products that contain nicotine or tobacco. These products include cigarettes, chewing tobacco, and vaping devices, such as e-cigarettes. If you need help quitting, ask your health care provider.  Do not use street drugs.  Do not share needles.  Ask your health care provider for help if you need support or information about quitting drugs.  General instructions  Schedule regular health, dental, and eye exams.  Stay current with your vaccines.  Tell your health care provider if:  You often feel depressed.  You have ever been abused or do not feel safe at home.  Summary  Adopting a healthy lifestyle and getting preventive care are important in promoting health and wellness.  Follow your health care provider's instructions about healthy diet, exercising, and getting tested or screened for diseases.  Follow your health care provider's instructions on monitoring your cholesterol and blood pressure.  This information is not intended to replace advice given to you by your health care provider. Make sure you discuss any questions you have with your health care provider.  Document Revised: 03/18/2021 Document Reviewed: 03/18/2021  Elsevier Patient Education  2024 [employer].

## 2024-08-17 DIAGNOSIS — Z0001 Encounter for general adult medical examination with abnormal findings: Secondary | ICD-10-CM | POA: Insufficient documentation

## 2024-08-17 DIAGNOSIS — Z23 Encounter for immunization: Secondary | ICD-10-CM | POA: Insufficient documentation

## 2024-08-19 LAB — ZINC: Zinc: 94 ug/dL (ref 60–130)

## 2024-08-20 ENCOUNTER — Ambulatory Visit: Payer: Self-pay | Admitting: Internal Medicine

## 2024-08-20 ENCOUNTER — Other Ambulatory Visit: Payer: Self-pay | Admitting: Internal Medicine

## 2024-08-20 LAB — VITAMIN B1: Vitamin B1 (Thiamine): 48 nmol/L — ABNORMAL HIGH (ref 8–30)

## 2024-09-10 ENCOUNTER — Other Ambulatory Visit

## 2024-09-16 ENCOUNTER — Other Ambulatory Visit: Payer: Self-pay | Admitting: Internal Medicine

## 2024-09-16 DIAGNOSIS — I10 Essential (primary) hypertension: Secondary | ICD-10-CM

## 2024-10-09 ENCOUNTER — Inpatient Hospital Stay: Admission: RE | Admit: 2024-10-09 | Source: Ambulatory Visit

## 2024-11-01 ENCOUNTER — Other Ambulatory Visit: Payer: Self-pay | Admitting: Internal Medicine

## 2024-11-01 DIAGNOSIS — E785 Hyperlipidemia, unspecified: Secondary | ICD-10-CM

## 2024-11-29 ENCOUNTER — Encounter: Payer: Self-pay | Admitting: Pediatrics

## 2024-12-07 ENCOUNTER — Other Ambulatory Visit: Payer: Self-pay | Admitting: Internal Medicine

## 2024-12-07 DIAGNOSIS — K2101 Gastro-esophageal reflux disease with esophagitis, with bleeding: Secondary | ICD-10-CM

## 2024-12-12 ENCOUNTER — Other Ambulatory Visit: Payer: Self-pay | Admitting: Medical Genetics

## 2024-12-15 ENCOUNTER — Other Ambulatory Visit: Payer: Self-pay | Admitting: Internal Medicine

## 2024-12-15 DIAGNOSIS — I10 Essential (primary) hypertension: Secondary | ICD-10-CM

## 2024-12-19 ENCOUNTER — Encounter

## 2025-01-02 ENCOUNTER — Encounter: Admitting: Pediatrics

## 2025-03-17 ENCOUNTER — Ambulatory Visit
